# Patient Record
Sex: Male | Born: 1940 | Race: White | Hispanic: No | Marital: Married | Smoking: Never smoker
Health system: Southern US, Community
[De-identification: ages and names within clinical notes are randomized; demographics above are authoritative.]

## PROBLEM LIST (undated history)

## (undated) DIAGNOSIS — S065X9A Traumatic subdural hemorrhage with loss of consciousness of unspecified duration, initial encounter: Secondary | ICD-10-CM

## (undated) DIAGNOSIS — S065XAA Traumatic subdural hemorrhage with loss of consciousness status unknown, initial encounter: Secondary | ICD-10-CM

## (undated) DIAGNOSIS — I10 Essential (primary) hypertension: Secondary | ICD-10-CM

## (undated) DIAGNOSIS — C439 Malignant melanoma of skin, unspecified: Secondary | ICD-10-CM

## (undated) DIAGNOSIS — R7303 Prediabetes: Secondary | ICD-10-CM

## (undated) DIAGNOSIS — H269 Unspecified cataract: Secondary | ICD-10-CM

## (undated) DIAGNOSIS — H548 Legal blindness, as defined in USA: Secondary | ICD-10-CM

## (undated) DIAGNOSIS — M75120 Complete rotator cuff tear or rupture of unspecified shoulder, not specified as traumatic: Secondary | ICD-10-CM

## (undated) DIAGNOSIS — E119 Type 2 diabetes mellitus without complications: Secondary | ICD-10-CM

## (undated) DIAGNOSIS — E785 Hyperlipidemia, unspecified: Secondary | ICD-10-CM

## (undated) DIAGNOSIS — L989 Disorder of the skin and subcutaneous tissue, unspecified: Secondary | ICD-10-CM

## (undated) DIAGNOSIS — M1990 Unspecified osteoarthritis, unspecified site: Secondary | ICD-10-CM

## (undated) DIAGNOSIS — E039 Hypothyroidism, unspecified: Secondary | ICD-10-CM

## (undated) DIAGNOSIS — I4891 Unspecified atrial fibrillation: Secondary | ICD-10-CM

## (undated) HISTORY — PX: APPENDECTOMY: SHX54

## (undated) HISTORY — DX: Essential (primary) hypertension: I10

## (undated) HISTORY — DX: Legal blindness, as defined in USA: H54.8

## (undated) HISTORY — DX: Unspecified osteoarthritis, unspecified site: M19.90

## (undated) HISTORY — DX: Hypothyroidism, unspecified: E03.9

## (undated) HISTORY — DX: Unspecified cataract: H26.9

## (undated) HISTORY — DX: Hyperlipidemia, unspecified: E78.5

## (undated) HISTORY — PX: TOTAL KNEE ARTHROPLASTY: SHX125

## (undated) HISTORY — DX: Prediabetes: R73.03

## (undated) HISTORY — PX: SHOULDER SURGERY: SHX246

## (undated) HISTORY — DX: Disorder of the skin and subcutaneous tissue, unspecified: L98.9

## (undated) HISTORY — DX: Complete rotator cuff tear or rupture of unspecified shoulder, not specified as traumatic: M75.120

## (undated) HISTORY — PX: ROTATOR CUFF REPAIR: SHX139

## (undated) HISTORY — DX: Type 2 diabetes mellitus without complications: E11.9

## (undated) HISTORY — DX: Unspecified atrial fibrillation: I48.91

## (undated) HISTORY — PX: KNEE SURGERY: SHX244

---

## 2004-05-04 ENCOUNTER — Ambulatory Visit: Payer: Self-pay | Admitting: Family Medicine

## 2004-05-12 ENCOUNTER — Ambulatory Visit: Payer: Self-pay | Admitting: Family Medicine

## 2004-09-14 ENCOUNTER — Ambulatory Visit: Payer: Self-pay | Admitting: Family Medicine

## 2004-09-20 ENCOUNTER — Ambulatory Visit: Payer: Self-pay | Admitting: Family Medicine

## 2004-09-22 ENCOUNTER — Ambulatory Visit: Payer: Self-pay | Admitting: Cardiology

## 2004-09-25 ENCOUNTER — Inpatient Hospital Stay (HOSPITAL_BASED_OUTPATIENT_CLINIC_OR_DEPARTMENT_OTHER): Admission: RE | Admit: 2004-09-25 | Discharge: 2004-09-25 | Payer: Self-pay | Admitting: Cardiovascular Disease

## 2004-09-25 ENCOUNTER — Ambulatory Visit: Payer: Self-pay | Admitting: Cardiovascular Disease

## 2004-09-29 ENCOUNTER — Ambulatory Visit: Payer: Self-pay

## 2004-10-24 ENCOUNTER — Ambulatory Visit: Payer: Self-pay | Admitting: Cardiology

## 2005-03-15 ENCOUNTER — Ambulatory Visit: Payer: Self-pay | Admitting: Family Medicine

## 2005-04-17 ENCOUNTER — Ambulatory Visit: Payer: Self-pay | Admitting: Family Medicine

## 2005-09-11 ENCOUNTER — Ambulatory Visit: Payer: Self-pay | Admitting: Family Medicine

## 2005-10-16 ENCOUNTER — Ambulatory Visit (HOSPITAL_COMMUNITY): Admission: RE | Admit: 2005-10-16 | Discharge: 2005-10-16 | Payer: Self-pay | Admitting: Otolaryngology

## 2005-12-24 ENCOUNTER — Ambulatory Visit: Payer: Self-pay | Admitting: Family Medicine

## 2006-01-07 ENCOUNTER — Encounter: Admission: RE | Admit: 2006-01-07 | Discharge: 2006-01-07 | Payer: Self-pay | Admitting: Orthopedic Surgery

## 2006-01-10 ENCOUNTER — Ambulatory Visit: Payer: Self-pay | Admitting: Family Medicine

## 2006-02-04 ENCOUNTER — Encounter: Admission: RE | Admit: 2006-02-04 | Discharge: 2006-03-18 | Payer: Self-pay | Admitting: Orthopedic Surgery

## 2006-06-20 ENCOUNTER — Ambulatory Visit: Payer: Self-pay | Admitting: Family Medicine

## 2006-08-14 ENCOUNTER — Ambulatory Visit: Payer: Self-pay | Admitting: Family Medicine

## 2006-10-21 ENCOUNTER — Encounter: Admission: RE | Admit: 2006-10-21 | Discharge: 2006-10-21 | Payer: Self-pay | Admitting: Otolaryngology

## 2006-11-04 ENCOUNTER — Encounter: Admission: RE | Admit: 2006-11-04 | Discharge: 2006-11-04 | Payer: Self-pay | Admitting: Orthopedic Surgery

## 2006-11-07 ENCOUNTER — Ambulatory Visit (HOSPITAL_BASED_OUTPATIENT_CLINIC_OR_DEPARTMENT_OTHER): Admission: RE | Admit: 2006-11-07 | Discharge: 2006-11-07 | Payer: Self-pay | Admitting: Orthopedic Surgery

## 2006-11-21 ENCOUNTER — Encounter: Admission: RE | Admit: 2006-11-21 | Discharge: 2007-02-19 | Payer: Self-pay | Admitting: Orthopedic Surgery

## 2007-02-27 HISTORY — PX: JOINT REPLACEMENT: SHX530

## 2007-09-08 ENCOUNTER — Ambulatory Visit: Payer: Self-pay | Admitting: Cardiology

## 2007-09-08 LAB — CONVERTED CEMR LAB: TSH: 1.57 microintl units/mL (ref 0.35–5.50)

## 2007-09-10 ENCOUNTER — Ambulatory Visit: Payer: Self-pay

## 2007-09-10 ENCOUNTER — Encounter: Payer: Self-pay | Admitting: Cardiology

## 2007-09-11 ENCOUNTER — Ambulatory Visit: Payer: Self-pay | Admitting: Cardiology

## 2007-09-12 ENCOUNTER — Ambulatory Visit: Payer: Self-pay | Admitting: Cardiology

## 2007-09-19 ENCOUNTER — Ambulatory Visit: Payer: Self-pay | Admitting: Cardiology

## 2007-09-26 ENCOUNTER — Ambulatory Visit: Payer: Self-pay | Admitting: Cardiology

## 2007-10-03 ENCOUNTER — Ambulatory Visit: Payer: Self-pay | Admitting: Cardiology

## 2007-10-10 ENCOUNTER — Ambulatory Visit: Payer: Self-pay | Admitting: Cardiovascular Disease

## 2007-11-05 ENCOUNTER — Ambulatory Visit: Payer: Self-pay | Admitting: Cardiovascular Disease

## 2007-11-05 ENCOUNTER — Ambulatory Visit: Payer: Self-pay | Admitting: Cardiology

## 2007-11-10 ENCOUNTER — Ambulatory Visit (HOSPITAL_COMMUNITY): Admission: RE | Admit: 2007-11-10 | Discharge: 2007-11-10 | Payer: Self-pay | Admitting: Cardiology

## 2007-11-10 ENCOUNTER — Ambulatory Visit: Payer: Self-pay | Admitting: Cardiology

## 2007-11-13 ENCOUNTER — Ambulatory Visit: Payer: Self-pay | Admitting: Cardiology

## 2007-11-13 LAB — CONVERTED CEMR LAB
Chloride: 104 meq/L (ref 96–112)
Creatinine, Ser: 1 mg/dL (ref 0.4–1.5)
GFR calc non Af Amer: 80 mL/min
Potassium: 4.5 meq/L (ref 3.5–5.1)
Sodium: 139 meq/L (ref 135–145)

## 2007-11-27 ENCOUNTER — Ambulatory Visit: Payer: Self-pay | Admitting: Cardiology

## 2007-12-08 ENCOUNTER — Ambulatory Visit: Payer: Self-pay | Admitting: Cardiology

## 2007-12-11 ENCOUNTER — Ambulatory Visit (HOSPITAL_COMMUNITY): Admission: RE | Admit: 2007-12-11 | Discharge: 2007-12-11 | Payer: Self-pay | Admitting: Cardiology

## 2007-12-11 ENCOUNTER — Ambulatory Visit: Payer: Self-pay | Admitting: Internal Medicine

## 2007-12-12 ENCOUNTER — Ambulatory Visit: Payer: Self-pay | Admitting: Cardiology

## 2007-12-29 ENCOUNTER — Ambulatory Visit: Payer: Self-pay | Admitting: Cardiology

## 2008-01-16 ENCOUNTER — Ambulatory Visit: Payer: Self-pay | Admitting: Cardiology

## 2008-01-16 ENCOUNTER — Ambulatory Visit: Payer: Self-pay | Admitting: Cardiovascular Disease

## 2008-01-26 ENCOUNTER — Ambulatory Visit: Payer: Self-pay | Admitting: Cardiology

## 2008-01-26 LAB — CONVERTED CEMR LAB
CO2: 30 meq/L (ref 19–32)
Calcium: 8.6 mg/dL (ref 8.4–10.5)
Chloride: 106 meq/L (ref 96–112)
Sodium: 140 meq/L (ref 135–145)

## 2008-06-26 DIAGNOSIS — M75120 Complete rotator cuff tear or rupture of unspecified shoulder, not specified as traumatic: Secondary | ICD-10-CM

## 2008-06-26 DIAGNOSIS — I10 Essential (primary) hypertension: Secondary | ICD-10-CM | POA: Insufficient documentation

## 2008-06-26 DIAGNOSIS — E039 Hypothyroidism, unspecified: Secondary | ICD-10-CM

## 2008-06-26 DIAGNOSIS — R0602 Shortness of breath: Secondary | ICD-10-CM

## 2008-06-26 DIAGNOSIS — I4891 Unspecified atrial fibrillation: Secondary | ICD-10-CM | POA: Insufficient documentation

## 2008-07-08 ENCOUNTER — Ambulatory Visit: Payer: Self-pay | Admitting: Cardiology

## 2008-07-16 ENCOUNTER — Encounter: Payer: Self-pay | Admitting: Cardiology

## 2008-07-17 ENCOUNTER — Encounter (INDEPENDENT_AMBULATORY_CARE_PROVIDER_SITE_OTHER): Payer: Self-pay | Admitting: *Deleted

## 2008-07-17 LAB — CONVERTED CEMR LAB
Alkaline Phosphatase: 60 units/L
Calcium: 8.8 mg/dL
Cholesterol: 122 mg/dL
LDL Cholesterol: 70 mg/dL
MCV: 89.7 fL
Platelets: 156 10*3/uL
Potassium: 4.3 meq/L
RDW: 14.3 %
TSH: 3.347 microintl units/mL
WBC: 7.4 10*3/uL

## 2008-07-19 ENCOUNTER — Encounter (INDEPENDENT_AMBULATORY_CARE_PROVIDER_SITE_OTHER): Payer: Self-pay | Admitting: *Deleted

## 2008-07-27 ENCOUNTER — Encounter: Payer: Self-pay | Admitting: *Deleted

## 2008-09-01 ENCOUNTER — Encounter: Payer: Self-pay | Admitting: *Deleted

## 2008-10-21 ENCOUNTER — Encounter (INDEPENDENT_AMBULATORY_CARE_PROVIDER_SITE_OTHER): Payer: Self-pay | Admitting: *Deleted

## 2009-01-10 ENCOUNTER — Ambulatory Visit: Payer: Self-pay | Admitting: Cardiology

## 2009-12-27 ENCOUNTER — Ambulatory Visit: Payer: Self-pay | Admitting: Cardiology

## 2009-12-27 ENCOUNTER — Encounter: Payer: Self-pay | Admitting: Cardiology

## 2010-01-03 ENCOUNTER — Telehealth (INDEPENDENT_AMBULATORY_CARE_PROVIDER_SITE_OTHER): Payer: Self-pay | Admitting: *Deleted

## 2010-01-11 ENCOUNTER — Inpatient Hospital Stay (HOSPITAL_COMMUNITY): Admission: RE | Admit: 2010-01-11 | Discharge: 2010-01-15 | Payer: Self-pay | Admitting: Orthopedic Surgery

## 2010-02-06 ENCOUNTER — Encounter
Admission: RE | Admit: 2010-02-06 | Discharge: 2010-02-25 | Payer: Self-pay | Source: Home / Self Care | Attending: Orthopedic Surgery | Admitting: Orthopedic Surgery

## 2010-02-26 ENCOUNTER — Encounter
Admission: RE | Admit: 2010-02-26 | Discharge: 2010-03-28 | Payer: Self-pay | Source: Home / Self Care | Attending: Orthopedic Surgery | Admitting: Orthopedic Surgery

## 2010-03-28 NOTE — Progress Notes (Signed)
Summary: Records Request  Faxed EKG to [facility] at [place] ([phone]). Debby Freiberg  January 03, 2010 4:58 PM

## 2010-03-28 NOTE — Assessment & Plan Note (Signed)
Summary: 1 YR F/U   CC:  sob.  History of Present Illness: [name] [name] is a pleasant  gentleman who has a history of paroxysmal atrial fibrillation.  Note, a previous catheterization in July 2006 showed no coronary artery disease and normal LV function.  His most recent echocardiogram performed on September 10, 2007, showed normal LV function, mildly calcified aortic valve, and mild left atrial enlargement.  TSH also have been in normal. The patient is also on flecainide to help maintain sinus rhythm. I last saw him in Nov 2010. Since then he has dyspnea with more extreme activities but not with routine activities. It is relieved with rest. It is not associated with chest pain. There is no orthopnea or PND. His pedal edema is controlled with diuretics. There is no palpitations or syncope.   Current Medications (verified): 1)  Crestor 5 Mg Tabs (Rosuvastatin Calcium) .... Take One Tablet By Mouth Daily. 2)  Furosemide 20 Mg Tabs (Furosemide) .... Take One Tablet By Mouth Daily. 3)  Diltiazem Hcl Er Beads 360 Mg Xr24h-Cap (Diltiazem Hcl Er Beads) .... Take One Capsule By Mouth Daily 4)  Flecainide Acetate 100 Mg Tabs (Flecainide Acetate) .... Take 1 Tablet By Mouth Twice A Day 5)  Lisinopril 20 Mg Tabs (Lisinopril) .... Take One Tablet By Mouth Daily 6)  Aspirin 325 Mg  Tabs (Aspirin) .Marland Kitchen.. 1 Tab By Mouth Once Daily 7)  Voltaren 75 Mg Tbec (Diclofenac Sodium) .Marland Kitchen.. 1 Tab By Mouth Two Times A Day 8)  Synthroid 100 Mcg Tabs (Levothyroxine Sodium) .... Tab By Mouth Once Daily  Allergies: No Known Drug Allergies  Past History:  Past Medical History: HYPOTHYROIDISM (ICD-244.9) HYPERTENSION (ICD-401.9) ATRIAL FIBRILLATION (ICD-427.31)  Past Surgical History: Reviewed history from 06/26/2008 and no changes required. APPENDECTOMY, HX OF (ICD-V45.79) ROTATOR CUFF TEAR (ICD-727.61)  Social History: Reviewed history from 06/26/2008 and no changes required. [employer] Married  Tobacco Use - No.    Alcohol Use - no  Review of Systems       Problems with bilateral knee pain but no fevers or chills, productive cough, hemoptysis, dysphasia, odynophagia, melena, hematochezia, dysuria, hematuria, rash, seizure activity, orthopnea, PND, pedal edema, claudication. Remaining systems are negative.   Vital Signs:  Patient profile:   70 year old male Height:      68 inches Weight:      272 pounds BMI:     41.51 Pulse rate:   66 / minute Resp:     14 per minute BP sitting:   118 / 80  (left arm)  Vitals Entered By: [name] [name] (December 27, 2009 11:30 AM)  Physical Exam  General:  Well-developed obese in no acute distress.  Skin is warm and dry.  HEENT is normal.  Neck is supple. No thyromegaly.  Chest is clear to auscultation with normal expansion.  Cardiovascular exam is regular rate and rhythm.  Abdominal exam nontender or distended. No masses palpated. Extremities show no edema. neuro grossly intact    EKG  Procedure date:  12/27/2009  Findings:      Sinus rhythm with first degree AV block and nonspecific ST changes.  Impression & Recommendations:  Problem # 1:  PREOPERATIVE EXAMINATION (ICD-V72.84) Patient is scheduled for knee replacement. I do not think he requires further cardiac evaluation preoperatively as he has no change in symptoms and previous catheterization normal. Continue flecainide pre-and postoperatively. He is most likely at increased risk for postoperative atrial fibrillation.  Problem # 2:  ATRIAL FIBRILLATION (ICD-427.31) He  remains in sinus rhythm. Continue flecainide. His only embolic risk factor is hypertension. Continue aspirin. His updated medication list for this problem includes:    Flecainide Acetate 100 Mg Tabs (Flecainide acetate) .Marland Kitchen... Take 1 tablet by mouth twice a day    Aspirin 325 Mg Tabs (Aspirin) .Marland Kitchen... 1 tab by mouth once daily  Orders: EKG w/ Interpretation (93000)  Problem # 3:  HYPERTENSION (ICD-401.9) Blood pressure  controlled on present medications. Will continue. Potassium and renal function monitored by primary care. His updated medication list for this problem includes:    Furosemide 20 Mg Tabs (Furosemide) .Marland Kitchen... Take one tablet by mouth daily.    Diltiazem Hcl Er Beads 360 Mg Xr24h-cap (Diltiazem hcl er beads) .Marland Kitchen... Take one capsule by mouth daily    Lisinopril 20 Mg Tabs (Lisinopril) .Marland Kitchen... Take one tablet by mouth daily    Aspirin 325 Mg Tabs (Aspirin) .Marland Kitchen... 1 tab by mouth once daily  Problem # 4:  HYPOTHYROIDISM (ICD-244.9)  His updated medication list for this problem includes:    Synthroid 100 Mcg Tabs (Levothyroxine sodium) .Marland Kitchen... Tab by mouth once daily  Problem # 5:  DYSPNEA (ICD-786.05) Most likely secondary to size and deconditioning. His updated medication list for this problem includes:    Furosemide 20 Mg Tabs (Furosemide) .Marland Kitchen... Take one tablet by mouth daily.    Diltiazem Hcl Er Beads 360 Mg Xr24h-cap (Diltiazem hcl er beads) .Marland Kitchen... Take one capsule by mouth daily    Lisinopril 20 Mg Tabs (Lisinopril) .Marland Kitchen... Take one tablet by mouth daily    Aspirin 325 Mg Tabs (Aspirin) .Marland Kitchen... 1 tab by mouth once daily  Patient Instructions: 1)  Your physician recommends that you schedule a follow-up appointment in: 1 YEAR WITH DR. CRENSHAW 2)  Your physician recommends that you continue on your current medications as directed. Please refer to the Current Medication list given to you today.

## 2010-05-09 LAB — PROTIME-INR
INR: 1.02 (ref 0.00–1.49)
INR: 1.66 — ABNORMAL HIGH (ref 0.00–1.49)
INR: 1.93 — ABNORMAL HIGH (ref 0.00–1.49)
INR: 2.09 — ABNORMAL HIGH (ref 0.00–1.49)
Prothrombin Time: 13.2 seconds (ref 11.6–15.2)
Prothrombin Time: 13.6 seconds (ref 11.6–15.2)
Prothrombin Time: 19.8 seconds — ABNORMAL HIGH (ref 11.6–15.2)
Prothrombin Time: 22.2 seconds — ABNORMAL HIGH (ref 11.6–15.2)

## 2010-05-09 LAB — COMPREHENSIVE METABOLIC PANEL
ALT: 28 U/L (ref 0–53)
AST: 19 U/L (ref 0–37)
Alkaline Phosphatase: 68 U/L (ref 39–117)
CO2: 26 mEq/L (ref 19–32)
GFR calc Af Amer: >60 mL/min (ref >60)
GFR calc non Af Amer: >60 mL/min (ref >60)
Glucose, Bld: 91 mg/dL (ref 70–99)
Potassium: 4.2 mEq/L (ref 3.5–5.1)
Sodium: 138 mEq/L (ref 135–145)
Total Protein: 7.6 g/dL (ref 6.0–8.3)

## 2010-05-09 LAB — URINALYSIS, ROUTINE W REFLEX MICROSCOPIC
Ketones, ur: NEGATIVE mg/dL
Nitrite: NEGATIVE
Protein, ur: 30 mg/dL — AB
Specific Gravity, Urine: 1.026 (ref 1.005–1.030)
Urobilinogen, UA: 1 mg/dL (ref 0.0–1.0)

## 2010-05-09 LAB — CBC
HCT: 32.2 % — ABNORMAL LOW (ref 39.0–52.0)
HCT: 44.4 % (ref 39.0–52.0)
Hemoglobin: 10.7 g/dL — ABNORMAL LOW (ref 13.0–17.0)
Hemoglobin: 10.9 g/dL — ABNORMAL LOW (ref 13.0–17.0)
Hemoglobin: 12 g/dL — ABNORMAL LOW (ref 13.0–17.0)
Hemoglobin: 14.8 g/dL (ref 13.0–17.0)
MCH: 30.4 pg (ref 26.0–34.0)
MCHC: 34 g/dL (ref 30.0–36.0)
Platelets: 197 10*3/uL (ref 150–400)
Platelets: 205 10*3/uL (ref 150–400)
RBC: 3.5 MIL/uL — ABNORMAL LOW (ref 4.22–5.81)
RBC: 3.61 MIL/uL — ABNORMAL LOW (ref 4.22–5.81)
RBC: 3.94 MIL/uL — ABNORMAL LOW (ref 4.22–5.81)
WBC: 10.5 10*3/uL (ref 4.0–10.5)
WBC: 11.5 10*3/uL — ABNORMAL HIGH (ref 4.0–10.5)
WBC: 9.2 10*3/uL (ref 4.0–10.5)

## 2010-05-09 LAB — BASIC METABOLIC PANEL
CO2: 25 mEq/L (ref 19–32)
Calcium: 8 mg/dL — ABNORMAL LOW (ref 8.4–10.5)
Calcium: 8.1 mg/dL — ABNORMAL LOW (ref 8.4–10.5)
Creatinine, Ser: 0.92 mg/dL (ref 0.4–1.5)
GFR calc Af Amer: >60 mL/min (ref >60)
GFR calc Af Amer: >60 mL/min (ref >60)
GFR calc non Af Amer: >60 mL/min (ref >60)
Glucose, Bld: 138 mg/dL — ABNORMAL HIGH (ref 70–99)
Potassium: 4.2 mEq/L (ref 3.5–5.1)
Sodium: 133 mEq/L — ABNORMAL LOW (ref 135–145)
Sodium: 135 mEq/L (ref 135–145)

## 2010-05-09 LAB — SURGICAL PCR SCREEN
MRSA, PCR: NEGATIVE
Staphylococcus aureus: NEGATIVE

## 2010-05-09 LAB — TYPE AND SCREEN: Antibody Screen: NEGATIVE

## 2010-05-09 LAB — ABO/RH: ABO/RH(D): B POS

## 2010-05-09 LAB — URINE MICROSCOPIC-ADD ON

## 2010-05-11 ENCOUNTER — Encounter (HOSPITAL_COMMUNITY): Payer: Medicare Other | Attending: Orthopedic Surgery

## 2010-05-11 ENCOUNTER — Other Ambulatory Visit: Payer: Self-pay | Admitting: Orthopedic Surgery

## 2010-05-11 DIAGNOSIS — Z01812 Encounter for preprocedural laboratory examination: Secondary | ICD-10-CM | POA: Insufficient documentation

## 2010-05-11 LAB — COMPREHENSIVE METABOLIC PANEL
ALT: 30 U/L (ref 0–53)
Alkaline Phosphatase: 67 U/L (ref 39–117)
BUN: 14 mg/dL (ref 6–23)
CO2: 28 mEq/L (ref 19–32)
Chloride: 104 mEq/L (ref 96–112)
GFR calc non Af Amer: >60 mL/min (ref >60)
Glucose, Bld: 94 mg/dL (ref 70–99)
Potassium: 4.4 mEq/L (ref 3.5–5.1)
Sodium: 140 mEq/L (ref 135–145)
Total Bilirubin: 0.5 mg/dL (ref 0.3–1.2)
Total Protein: 7.3 g/dL (ref 6.0–8.3)

## 2010-05-11 LAB — CBC
HCT: 42.5 % (ref 39.0–52.0)
MCH: 28.1 pg (ref 26.0–34.0)
MCHC: 32.2 g/dL (ref 30.0–36.0)
RDW: 14.3 % (ref 11.5–15.5)

## 2010-05-11 LAB — SURGICAL PCR SCREEN
MRSA, PCR: NEGATIVE
Staphylococcus aureus: NEGATIVE

## 2010-05-11 LAB — URINALYSIS, ROUTINE W REFLEX MICROSCOPIC
Bilirubin Urine: NEGATIVE
Glucose, UA: NEGATIVE mg/dL
Ketones, ur: NEGATIVE mg/dL
Nitrite: NEGATIVE
Protein, ur: NEGATIVE mg/dL

## 2010-05-11 LAB — PROTIME-INR: INR: 1.04 (ref 0.00–1.49)

## 2010-05-17 ENCOUNTER — Inpatient Hospital Stay (HOSPITAL_COMMUNITY)
Admission: RE | Admit: 2010-05-17 | Discharge: 2010-05-21 | DRG: 470 | Disposition: A | Discharge status: Home Health | Payer: Medicare Other | Source: Ambulatory Visit | Attending: Orthopedic Surgery | Admitting: Orthopedic Surgery

## 2010-05-17 DIAGNOSIS — Z96659 Presence of unspecified artificial knee joint: Secondary | ICD-10-CM

## 2010-05-17 DIAGNOSIS — M1710 Unilateral primary osteoarthritis, unspecified knee: Principal | ICD-10-CM | POA: Diagnosis present

## 2010-05-17 DIAGNOSIS — E039 Hypothyroidism, unspecified: Secondary | ICD-10-CM | POA: Diagnosis present

## 2010-05-17 DIAGNOSIS — K5900 Constipation, unspecified: Secondary | ICD-10-CM | POA: Diagnosis not present

## 2010-05-17 DIAGNOSIS — E785 Hyperlipidemia, unspecified: Secondary | ICD-10-CM | POA: Diagnosis present

## 2010-05-17 DIAGNOSIS — I4891 Unspecified atrial fibrillation: Secondary | ICD-10-CM | POA: Diagnosis present

## 2010-05-17 DIAGNOSIS — I10 Essential (primary) hypertension: Secondary | ICD-10-CM | POA: Diagnosis present

## 2010-05-17 NOTE — H&P (Addendum)
[name] [name], [name] [name]                 ACCOUNT NO.:  [id number]  MEDICAL RECORD NO.:  [id number]           PATIENT TYPE:  I  LOCATION:  0006                         FACILITY:  [facility]  PHYSICIAN:  Ollen Gross, M.D.    DATE OF BIRTH:  08-23-1940  DATE OF ADMISSION:  05/17/2010 DATE OF DISCHARGE:                             HISTORY & PHYSICAL   CHIEF COMPLAINT:  Right knee pain.  BRIEF HISTORY:  [name] [name] has been followed for worsening knee pain by Dr. Lequita Halt.  He recently had a left total-knee arthroplasty about four months ago now.  He is doing very well with that and he feels that the pain and dysfunction in the right knee continues to worsen so he now presents for a right total-knee arthroplasty.  MEDICATION ALLERGIES:  NONE.  PRIMARY CARE PHYSICIAN:  Delaney Meigs, M.D.  CURRENT MEDICATIONS: 1. Crestor. 2. Synthroid. 3. Diclofenac. 4. Lisinopril. 5. Flecainide. 6. Diltiazem. 7. Furosemide. 8. Aspirin.  PAST MEDICAL HISTORY: 1. End-stage arthritis of the right knee. 2. Dentures both top and bottom.  They are partial. 3. Hypertension. 4. Hyperlipidemia. 5. Atrial fibrillation.  However, he states that he has not had any     episodes of this since starting the flecainide and diltiazem. 6. Thyroid disease. 7. Arthritis.  PAST SURGICAL HISTORY: 1. Left total-knee January 11, 2010. 2. Arthroscopic surgery on both knees. 3. Left shoulder surgery. 4. Appendectomy.  FAMILY HISTORY:  Father passed at the age of 19.  He had asthma and COPD.  Mother passed at the age of 29 of a heart attack.  SOCIAL HISTORY:  The patient is married.  He denies past or present use of alcohol or tobacco products.  He has two children.  He lives at home with his family and he does plan to go home following his hospital stay. He has four steps to get into his home.  REVIEW OF SYSTEMS:  GENERAL:  Negative for fevers, chills or weight change.  HEENT/NEUROLOGIC:  Negative for headache  or blurred vision.  He does wear dentures.  DERMATOLOGIC:  Negative for rash or lesion. RESPIRATORY:  Positive for shortness of breath on exertion.  No shortness of breath at rest.  Negative for cough or wheezing. CARDIOVASCULAR:  Negative for chest pain or palpitations.  Last EKG was February 2012.  GASTROINTESTINAL:  Negative for nausea, vomiting or diarrhea.  GENITOURINARY:  Positive for urinating at nighttime. Negative for dysuria or hematuria.  MUSCULOSKELETAL:  Positive for joint pain and joint swelling.  PHYSICAL EXAMINATION:  VITAL SIGNS:  Pulse 76, respirations 18, blood pressure 146/80 in the left arm. GENERAL:  [name] [name] is alert and oriented x3.  He is well-developed, well-nourished, in no apparent distress.  He is a pleasant 70 year old male who is a stated height of 5 feet 9 inches and a stated weight of 290 pounds. HEENT:  Normocephalic, atraumatic.  Extraocular movements intact.  The patient wears reading glasses. NECK:  Supple.  Full range of motion without lymphadenopathy. CHEST:  Lungs are clear to auscultation bilaterally without wheezes. HEART:  Regular rate and rhythm without murmur. ABDOMEN:  Bowel sounds present in all four quadrants. EXTREMITIES:  Right knee decreased range of motion, marked crepitus throughout the range of motion. SKIN:  Unremarkable. NEUROLOGIC:  Intact.  RADIOGRAPHS:  AP and lateral views of the right knee reveal end-stage arthritis.  IMPRESSION:  End-stage arthritis of the right knee.  PLAN:  Right total-knee arthroplasty to be performed by Dr. Lequita Halt.     Rozell Searing, PAC   ______________________________ Ollen Gross, M.D.    LD/MEDQ  D:  05/17/2010  T:  05/17/2010  Job:  [id number]  cc:   Delaney Meigs, M.D. Fax: [phone]  Electronically Signed by Rozell Searing  on 05/17/2010 03:41:50 PM Electronically Signed by Ollen Gross M.D. on 05/22/2010 07:11:47 AM

## 2010-05-18 LAB — CBC
HCT: 34.3 % — ABNORMAL LOW (ref 39.0–52.0)
Hemoglobin: 11.1 g/dL — ABNORMAL LOW (ref 13.0–17.0)
MCH: 28 pg (ref 26.0–34.0)
MCHC: 32.4 g/dL (ref 30.0–36.0)
MCV: 86.6 fL (ref 78.0–100.0)
RDW: 13.9 % (ref 11.5–15.5)

## 2010-05-18 LAB — BASIC METABOLIC PANEL
BUN: 6 mg/dL (ref 6–23)
CO2: 28 mEq/L (ref 19–32)
Calcium: 8 mg/dL — ABNORMAL LOW (ref 8.4–10.5)
GFR calc non Af Amer: >60 mL/min (ref >60)
Glucose, Bld: 146 mg/dL — ABNORMAL HIGH (ref 70–99)
Sodium: 135 mEq/L (ref 135–145)

## 2010-05-19 LAB — BASIC METABOLIC PANEL
CO2: 30 mEq/L (ref 19–32)
Calcium: 8.4 mg/dL (ref 8.4–10.5)
Creatinine, Ser: 0.86 mg/dL (ref 0.4–1.5)
GFR calc Af Amer: >60 mL/min (ref >60)
GFR calc non Af Amer: >60 mL/min (ref >60)
Glucose, Bld: 144 mg/dL — ABNORMAL HIGH (ref 70–99)
Sodium: 133 mEq/L — ABNORMAL LOW (ref 135–145)

## 2010-05-19 LAB — CBC
Hemoglobin: 11.3 g/dL — ABNORMAL LOW (ref 13.0–17.0)
MCH: 28 pg (ref 26.0–34.0)
MCHC: 32.4 g/dL (ref 30.0–36.0)
RDW: 13.8 % (ref 11.5–15.5)

## 2010-05-20 LAB — BASIC METABOLIC PANEL
Calcium: 8.5 mg/dL (ref 8.4–10.5)
Creatinine, Ser: 0.91 mg/dL (ref 0.4–1.5)
GFR calc Af Amer: >60 mL/min (ref >60)
GFR calc non Af Amer: >60 mL/min (ref >60)
Sodium: 131 mEq/L — ABNORMAL LOW (ref 135–145)

## 2010-05-20 LAB — CBC
Hemoglobin: 10.6 g/dL — ABNORMAL LOW (ref 13.0–17.0)
MCH: 27.7 pg (ref 26.0–34.0)
MCHC: 32.4 g/dL (ref 30.0–36.0)
Platelets: 209 10*3/uL (ref 150–400)
RBC: 3.82 MIL/uL — ABNORMAL LOW (ref 4.22–5.81)

## 2010-05-22 NOTE — Op Note (Signed)
[name], [name]                 ACCOUNT NO.:  [id number]  MEDICAL RECORD NO.:  [id number]           PATIENT TYPE:  I  LOCATION:  0006                         FACILITY:  [facility]  PHYSICIAN:  Ollen Gross, M.D.    DATE OF BIRTH:  [birth year]  DATE OF PROCEDURE:  05/17/2010 DATE OF DISCHARGE:                              OPERATIVE REPORT   PREOPERATIVE DIAGNOSIS:  Osteoarthritis, right knee.  POSTOPERATIVE DIAGNOSIS:  Osteoarthritis, right knee.  PROCEDURE:  Right total knee arthroplasty.  SURGEON:  Ollen Gross, M.D.  ASSISTANT:  Tech assist.  ANESTHESIA:  General.  ESTIMATED BLOOD LOSS:  Minimal.  DRAINS:  Hemovac x1.  TOURNIQUET TIME:  47 minutes at 300 mmHg.  COMPLICATIONS:  None.  CONDITION:  Stable to recovery.  BRIEF CLINICAL NOTE:  [name] is a 70 year old male with end-stage arthritis of the right knee with progressively worsening pain and dysfunction.  He has failed nonoperative management and presents for total knee arthroplasty.  PROCEDURE IN DETAIL:  After successful administration of general anesthetic, a tourniquet is placed on the right thigh, and his right lower extremity is prepped and draped in the usual sterile fashion. Extremity is wrapped in Esmarch, knee flexed, and tourniquet inflated to 300 mmHg.  Midline incision is made with a 10 blade through subcutaneous tissue to the level of the extensor mechanism.  Fresh blade is used to make a medial parapatellar arthrotomy.  Soft tissue on the proximal medial tibia subperiosteally elevated to joint line with the knife and into the semimembranosus bursa with a Cobb elevator.  Soft tissue laterally is elevated with attention being paid to avoiding the patellar tendon on the tibial tubercle.  Patella was everted, knee flexed 90 degrees, and ACL and PCL removed.  Drill was used create a starting hole in the distal femur, and the canal was thoroughly irrigated.  A 5-degree right valgus alignment guide  is placed, and the distal femoral cutting block is pinned to remove 11 mm off the distal femur.  Distal femoral resection is made with an oscillating saw.  Tibia is subluxed forward, and the menisci are removed.  Extramedullary tibial alignment guide is placed referencing proximally at the medial aspect of the tibial tubercle and distally along the second metatarsal axis and tibial crest.  Block is pinned to remove 2 mm off the more deficient medial side.  Tibial resection is made with an oscillating saw.  Size 4 is the most appropriate tibial component, and the proximal tibia is prepared to modular drill and Keel punch for the size 4.  Femoral sizing guide is placed, and size 5 is the most appropriate. Rotation is marked off the epicondylar axis and confirmed by creating a rectangular flexion gap at 90 degrees.  The block is pinned in this rotation, and the anterior-posterior and chamfer cuts are made.  The intercondylar block is placed and that cut is made.  The trial size 5 posterior stabilized femur is placed.  The 12.5 mm posterior stabilized rotating platform insert trial was placed with the 12.5.  Full extension is achieved with excellent varus-valgus and anterior-posterior balance throughout full  range of motion.  Patella was everted and thickness measured to be 25 mm.  Freehand resection is taken to 13 mm, 41 template is placed, lug holes were drilled, trial patella was placed and it tracks normally.  Osteophytes are then removed off the posterior femur with the trial in place.  All trials are removed, and the cut bone surfaces are prepared with pulsatile lavage.  Cement is mixed, and once ready for implantation, the size 4 mobile bearing tibial tray, size 5 posterior stabilized femur, and 41 patella are cemented into place, and the patella is held with the clamp.  Trial 12.5-mm insert is placed, knee held in full extension, and all extruded cement removed.  When the cement was  fully hardened, then the permanent 12.5 mm posterior stabilized rotating platform insert is placed into the tibial tray. Wound was copiously irrigated with the saline solution and the arthrotomy closed over a Hemovac drain with interrupted #1 PDS.  Flexion against gravity to 135 degrees.  Patella tracks normally.  Tourniquet is released, total time of 47 minutes.  No significant bleeding is identified.  Minor bleeding is stopped with electrocautery. Subcutaneous is closed with interrupted 2-0 Vicryl and subcuticular with running 4-0 Monocryl.  Catheter for the Marcaine pain pump is placed and the pump is initiated.  Incisions cleaned and dried, and Steri-Strips and a bulky sterile dressing are applied.  He is then placed into a knee immobilizer, awakened, and transported to recovery in stable condition.     Ollen Gross, M.D.     FA/MEDQ  D:  05/17/2010  T:  05/17/2010  Job:  [id number]  Electronically Signed by Ollen Gross M.D. on 05/22/2010 07:11:50 AM

## 2010-06-12 ENCOUNTER — Ambulatory Visit: Payer: Medicare Other | Attending: Orthopedic Surgery | Admitting: Physical Therapy

## 2010-06-12 DIAGNOSIS — Z96659 Presence of unspecified artificial knee joint: Secondary | ICD-10-CM | POA: Insufficient documentation

## 2010-06-12 DIAGNOSIS — IMO0001 Reserved for inherently not codable concepts without codable children: Secondary | ICD-10-CM | POA: Insufficient documentation

## 2010-06-12 DIAGNOSIS — M25569 Pain in unspecified knee: Secondary | ICD-10-CM | POA: Insufficient documentation

## 2010-06-12 DIAGNOSIS — M25669 Stiffness of unspecified knee, not elsewhere classified: Secondary | ICD-10-CM | POA: Insufficient documentation

## 2010-06-12 DIAGNOSIS — R5381 Other malaise: Secondary | ICD-10-CM | POA: Insufficient documentation

## 2010-06-14 ENCOUNTER — Ambulatory Visit: Payer: Medicare Other | Admitting: Physical Therapy

## 2010-06-15 ENCOUNTER — Ambulatory Visit: Payer: Medicare Other | Admitting: Physical Therapy

## 2010-06-19 ENCOUNTER — Ambulatory Visit: Payer: Medicare Other | Admitting: Physical Therapy

## 2010-06-20 ENCOUNTER — Ambulatory Visit: Payer: Medicare Other | Admitting: *Deleted

## 2010-06-21 ENCOUNTER — Ambulatory Visit: Payer: Medicare Other | Admitting: Physical Therapy

## 2010-06-26 ENCOUNTER — Ambulatory Visit: Payer: Medicare Other | Admitting: Physical Therapy

## 2010-06-27 ENCOUNTER — Ambulatory Visit: Payer: Medicare Other | Attending: Orthopedic Surgery | Admitting: Physical Therapy

## 2010-06-27 DIAGNOSIS — M25669 Stiffness of unspecified knee, not elsewhere classified: Secondary | ICD-10-CM | POA: Insufficient documentation

## 2010-06-27 DIAGNOSIS — Z96659 Presence of unspecified artificial knee joint: Secondary | ICD-10-CM | POA: Insufficient documentation

## 2010-06-27 DIAGNOSIS — IMO0001 Reserved for inherently not codable concepts without codable children: Secondary | ICD-10-CM | POA: Insufficient documentation

## 2010-06-27 DIAGNOSIS — M25569 Pain in unspecified knee: Secondary | ICD-10-CM | POA: Insufficient documentation

## 2010-06-27 DIAGNOSIS — R5381 Other malaise: Secondary | ICD-10-CM | POA: Insufficient documentation

## 2010-06-29 ENCOUNTER — Ambulatory Visit: Payer: Medicare Other | Admitting: *Deleted

## 2010-07-03 ENCOUNTER — Ambulatory Visit: Payer: Medicare Other | Admitting: Physical Therapy

## 2010-07-04 ENCOUNTER — Ambulatory Visit: Payer: Medicare Other | Admitting: *Deleted

## 2010-07-06 ENCOUNTER — Ambulatory Visit: Payer: Medicare Other | Admitting: *Deleted

## 2010-07-11 NOTE — Assessment & Plan Note (Signed)
[name]                            CARDIOLOGY OFFICE NOTE   [name], [name] [name]                        MRN:          [id number]  DATE:09/08/2007                            DOB:          [birth year]    [name] [name] is a 70 year old male who I am asked to evaluate for new onset  atrial fibrillation.  Note, the patient has been seen in this office in  the past.  In July 2006, I saw the patient for exertional chest pain.  He underwent cardiac catheterization on September 25, 2004.  There was no  obstructive coronary artery disease noted and the ejection fraction 60%.  Also note, he has carotid Dopplers performed on Jun 29, 2004, that showed  to 0-39% stenosis bilaterally.  I have not seen the patient since October 24, 2004.  He was recently seen for a routine physical examination and  apparently was noted to be in atrial fibrillation and we were asked to  further evaluate.  Note, he does have some dyspnea on exertion, which  has been a chronic issue.  However, there is no orthopnea, PND, or pedal  edema.  He has not had chest pain, palpitations, or syncope.   MEDICATIONS:  1. Synthroid 100 mcg p.o. daily.  2. Aspirin 81 mg p.o. daily.  3. Lisinopril 10 mg daily.   ALLERGIES:  He has no known drug allergies.   SOCIAL HISTORY:  He does not smoke nor does he consume alcohol.   FAMILY HISTORY:  Positive for coronary artery disease in both his  brothers.   PAST MEDICAL HISTORY:  Significant for hypertension.  There is no  diabetes mellitus.  He states he was recently told he had  hyperlipidemia, will require medication.  He does have a history of  hypothyroidism as well.  He has had prior appendectomy as well as  rotator cuff surgery.  He has had previous arthroscopic knee surgery  bilaterally.   REVIEW OF SYSTEMS:  He denies any headaches, fevers, or chills.  There  was no productive cough or hemoptysis.  There is no dysphagia,  odynophagia, melena, or  hematochezia.  There is no dysuria or hematuria.  There is no rash or seizure activity.  There is no orthopnea, PND, or  pedal edema.  Remaining systems are negative.   PHYSICAL EXAMINATION:  VITAL SIGNS:  Today shows a blood pressure of  125/80, his pulse is 128, and his weighs 287.  GENERAL:  He is well-developed and an obese.  He is no acute distress at  present.  He does not appeared to be depressed.  There is no peripheral  clubbing.  SKIN:  Warm, dry.  BACK:  Normal.  HEENT:  Normal with normal eyelids.  NECK:  Supple with a normal upstroke bilaterally.  I cannot appreciate  bruits.  There is no jugular distention and no thyromegaly.  CHEST:  Clear to auscultation.  No expansion.  CARDIOVASCULAR:  Irregular rhythm.  I cannot appreciate murmurs, rubs,  or gallop.  He is also tachycardic.  ABDOMEN:  Nontender and nondistended.  Positive  bowel sounds.  No  hepatosplenomegaly.  No mass appreciated.  There is no abdominal bruit.  He has 2+ femoral pulses bilaterally.  No bruits.  EXTREMITIES:  No edema noted and I can palpate no cords.  He has 2+  posterior tibial pulses bilaterally.  NEUROLOGIC:  Grossly intact.   LABORATORY DATA:  His electrocardiogram shows atrial fibrillation with  rapid ventricle response at 128.  The axis is normal.  There are  nonspecific ST changes.   DIAGNOSIS:  1. New-onset atrial fibrillation - [name] [name] is in atrial fibrillation      with rapid ventricle response.  However, he appears to be      relatively asymptomatic.  We will plan to discontinue his      lisinopril and we will add Cardizem CD 240 mg p.o. daily for rate      control.  We will also check an echocardiogram to quantify his left      ventricular function and also a TSH to make sure that      hyperthyroidism is not contributing.  Note, his CHADS2 score is 1      for hypertension.  However, we will begin Coumadin at 5 mg p.o.      daily in anticipation of cardioverting the patient if he  does not      convert on his own.  He will be followed in the Coumadin Clinic      with an INR check on September 12, 2007.  I do not think he will require      Coumadin long term given his low risk.  Note, if he has recurrent      atrial fibrillation in the future following the cardioversion, then      I would tend to favor rate control as he does not appear to be      having significant symptoms.  2. Hypertension - his blood pressure appears to be adequately      controlled, but we are changing his medications as described above      to treat both his blood pressure and his atrial fibrillation.  3. Hypothyroidism - he will continue his present medications.  We will      check a TSH.  4. History of no significant coronary artery disease on      catheterization in 2006.   I will see him back on September 11, 2007, to make sure his rate has improved  on the Cardizem.     Madolyn Frieze Jens Som, MD, [facility]  Electronically Signed    BSC/MedQ  DD: 09/08/2007  DT: 09/09/2007  Job #: [id number]   cc:   Delaney Meigs, M.D.

## 2010-07-11 NOTE — Procedures (Signed)
[facility]                              EXERCISE TREADMILL   NAME:[name] [name], [name] [name]                        MRN:          [id number]  DATE:12/12/2007                            DOB:          1940/07/01    [name] is a pleasant 70 year old male who has a history of paroxysmal  atrial fibrillation.  He was recently placed on flecainide and underwent  cardioversion to sinus rhythm yesterday.  The studies performed to  exclude exercise-induced arrhythmia.   The patient exercised for 2 minutes and 48 seconds on the Bruce  protocol.  His resting heart rate was 70 and increased to a maximum of  111.  His blood pressure at rest was 147/80 and increased to 202/65.  There was no chest pain during the study.  There was a rare PVC, but no  sustained ventricular arrhythmias were noted.  There are no ST changes.  The study was terminated secondary to fatigue.   FINAL INTERPRETATION:  Exercise treadmill with poor tolerance.  However,  there were no exercise-induced ventricular arrhythmias and there were no  ST changes.     Madolyn Frieze Jens Som, MD, [facility]  Electronically Signed    BSC/MedQ  DD: 12/12/2007  DT: 12/13/2007  Job #: [id number]

## 2010-07-11 NOTE — Assessment & Plan Note (Signed)
[name]                            CARDIOLOGY OFFICE NOTE   NAME:[name] [name]                        MRN:          [id number]  DATE:01/16/2008                            DOB:          1940-06-09    [name] is a pleasant 69 year old gentleman who has a history of  paroxysmal atrial fibrillation.  Note, a previous catheterization in  July 2006 showed no coronary artery disease and normal LV function.  His  most recent echocardiogram performed on September 10, 2007, showed normal LV  function, mildly calcified aortic valve, and mild left atrial  enlargement.  TSH also have been in normal.  The patient was recently  cardioverted, but returned atrial fibrillation.  He was, therefore,  placed on flecainide and underwent a repeat cardioversion to sinus  rhythm.  Since then, he does have some dyspnea on exertion.  However, it  is improved compare to prior to the cardioversion.  It is relieved with  rest.  There is no associated cough or chest pain.  He has not had  palpitations or syncope.  He does have chronic pedal edema which is also  unchanged.  There is no bleeding.   MEDICATIONS:  1. Synthroid 100 mcg p.o. daily.  2. Coumadin as directed.  3. Crestor 5 mg p.o. daily.  4. Voltaren 75 mg p.o. b.i.d.  5. Cardizem 360 mg p.o. daily.  6. Lasix 20 mg p.o. daily.  7. Flecainide 100 mg p.o. b.i.d.   PHYSICAL EXAMINATION:  VITAL SIGNS:  Blood pressure of 165/87 and his  pulse is 60.  He weighs 295 pounds.  HEENT:  Normal.  NECK:  Supple.  CHEST:  Clear.  CARDIOVASCULAR:  Regular rate and rhythm.  ABDOMEN:  No tenderness.  EXTREMITIES:  Traced 1+ edema.   His electrocardiogram today shows a sinus rhythm at a rate of 64.  There  is a first-degree AV block.  There are nonspecific ST changes.   DIAGNOSES:  1. Atrial fibrillation status post cardioversion to sinus rhythm - the      patient remains in sinus rhythm today.  We will continue his  flecainide as well as his Cardizem.  He has embolic risk factors of      hypertension, so his CHAD2 score is 1.  I, therefore, will      discontinue his Coumadin as it has been greater than 4 weeks since      his cardioversion.  We will instead treat with enteric-coated      aspirin 325 mg p.o. daily.  2. Dyspnea - this may be due to his size and deconditioning.  He      remains in sinus rhythm.  3. Hypertension - his blood pressure is elevated today.  I will add      lisinopril 10 mg p.o. daily and we will check a BMET in 1 week to      follow his potassium and renal function.  4. Hypothyroidism.  5. History of no coronary artery disease on previous catheterization.   We will see him back in 6 months.  Madolyn Frieze Jens Som, MD, [facility]  Electronically Signed    BSC/MedQ  DD: 01/16/2008  DT: 01/17/2008  Job #: [id number]

## 2010-07-11 NOTE — Op Note (Signed)
NAMEMATEJ, SAPPENFIELD                 ACCOUNT NO.:  [id number]   MEDICAL RECORD NO.:  [id number]          PATIENT TYPE:  AMB   LOCATION:  DSC                          FACILITY:  [facility]   PHYSICIAN:  Rodney A. Mortenson, M.D.DATE OF BIRTH:  [birth year]   DATE OF PROCEDURE:  11/07/2006  DATE OF DISCHARGE:                               OPERATIVE REPORT   JUSTIFICATION:  This 70 year old male has had trouble with his right  knee for about 6 months with pain along the medial joint line.  Rolling  over in bed causes significant pain.  He has trouble with stairs.  Discomfort is noted in the popliteal area.  There is definite tenderness  along the medial joint line.  An MRI is done and this shows a complex  tear of the medial meniscus with maceration of the body of the medial  meniscus and tears including the posterior horn.  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  Because of persistent pain  and discomfort, it is felt that arthroscopic evaluation and treatment  are indicated and necessary.  Complications discussed.  Questions  answered and encouraged.  It has been outlined that osteophytic changes  could not be handled through the arthroscope and with the majority of  his pain from the torn meniscus, trimming the meniscus back with be  beneficial.   JUSTIFICATION FOR OUTPATIENT SURGERY:  Minimal morbidity.   PREOPERATIVE DIAGNOSIS:  Torn medial meniscus, right knee;  osteoarthritis, tricompartmental area, right knee.   POSTOPERATIVE DIAGNOSIS:  Torn medial meniscus, right knee;  osteoarthritis, tricompartmental area, right knee.   OPERATION:  Partial medial meniscectomy, right knee.   SURGEON:  Lenard Galloway. Chaney Malling, M.D.   ANESTHESIA:  General.   PATHOLOGY:  With the arthroscope, a very careful examination of the knee  was undertaken.  There was some chondromalacia on the back side of the  patella and there was fraying of the articular cartilage in the femoral  notch area rated about grade 2 cartilage damage.  The anterior cruciate  ligament was normal.  In the lateral compartment, articular cartilage,  lateral femoral condyle and lateral tibial plateau were absolutely  normal and the entire circumference of the lateral meniscus was normal.  In the medial compartment, there was extensive fraying and tearing of  the posterior one-half of the medial meniscus.  There was an area of  full-thickness cartilage loss off the weightbearing area of the medial  tibial plateau.   PROCEDURE:  The patient was placed on the operative table in a supine  position with a pneumatic tourniquet about the right upper thigh.  The  right leg was placed in a leg holder and the entire right lower  extremity prepped with DuraPrep and draped out in the usual manner.  An  infusion cannula was placed in the superomedial pouch and the knee  distended with saline.  Anteromedial and anterolateral portals were made  and the arthroscope was introduced.  Attention was turned to the medial  compartment.  Through the medial and lateral portals, arthroscope was  introduced.  Visualization was somewhat compromised  and additional  portals were made; 4 anterior portals were eventually utilized and this  gave excellent access.  There was extensive tearing of the posterior  horn of the medial meniscus and this was debrided through several  portals with different baskets.  This was followed up with the intra-  articular shaver.  The remaining rim was then smoothed and balanced with  a nice transition at the mid third of the medial meniscus.  Again, there  was a large area of full-thickness cartilage loss off the weightbearing  area of the medial tibial plateau that could not be addressed.  Knee was  then filled with Marcaine, a large bulky pressure dressing applied and  the patient returned to recovery room in excellent condition.  Technically, this went extremely well.   FOLLOWUP  CARE:  1. Percocet for pain.  2. To my office on Wednesday.  3. Usual postoperative instructions given.           ______________________________  Lenard Galloway. Chaney Malling, M.D.     RAM/MEDQ  D:  11/07/2006  T:  11/08/2006  Job:  [id number]

## 2010-07-11 NOTE — Op Note (Signed)
NAMEKARMELLO, [name] [name]                 ACCOUNT NO.:  [id number]   MEDICAL RECORD NO.:  [id number]          PATIENT TYPE:  OIB   LOCATION:  2899                         FACILITY:  [facility]   PHYSICIAN:  Hillis Range, MD       DATE OF BIRTH:  04/29/1940   DATE OF PROCEDURE:  DATE OF DISCHARGE:  12/11/2007                               OPERATIVE REPORT   PREPROCEDURE DIAGNOSIS:  Persistent atrial fibrillation.   POSTPROCEDURE DIAGNOSIS:  Persistent atrial fibrillation.   PROCEDURE:  DC cardioversion.   DESCRIPTION OF THE PROCEDURE:  Informed written consent was obtained and  the patient was brought to the short stay area.  He was adequately  sedated with intravenous propofol as administered by anesthesia.  The  patient was noted to be in atrial fibrillation upon presentation.  He  was successfully cardioverted to sinus rhythm with a single synchronized  200 joules biphasic shock with cardioversion, electrodes in the  anteroposterior thoracic configuration.  He remained in sinus rhythm  thereafter.  There were no early apparent complications.   CONCLUSIONS:  1. Persistent atrial fibrillation upon presentation.  2. Successful cardioversion to sinus rhythm.  3. No early apparent complications.      Hillis Range, MD  Electronically Signed     JA/MEDQ  D:  12/11/2007  T:  12/11/2007  Job:  [id number]   cc:   Madolyn Frieze. Jens Som, MD, [facility]

## 2010-07-11 NOTE — Assessment & Plan Note (Signed)
[facility]   NAME:  [name] [name], [name] [name]                 ACCOUNT NO.:  [id number]   MEDICAL RECORD NO.:  [id number]      DATE OF BIRTH:  Jul 04, 1940   PHYSICIAN:  Madolyn Frieze. Jens Som, MD, [facility] VISIT DATE:  11/10/2007                                   OFFICE VISIT   The patient was scheduled for cardioversion of atrial fibrillation.  He  was sedated with Pentothal 150 mg intravenously.  Synchronized  cardioversion 150 joules (biphasic) resulted in normal sinus rhythm.  There were no immediate complications.  The patient then reverted back  to atrial fibrillation after less than 1 minute.  A repeat cardioversion  was then re-attempted after 100 mg of Pentothal was re-administered.  150 joules (biphasic) were used for this cardioversion.  Again, the  patient developed transient normal sinus rhythm but reverted back to  atrial fibrillation.  We therefore plan to continue with a rate control  anticoagulation.  I will have him followup in the office and we will  review rate control anticoagulation/antiarrhythmic verses ablation  strategies.      Madolyn Frieze Jens Som, MD, [facility]  Electronically Signed     BSC/MEDQ  D:  11/10/2007  T:  11/11/2007  Job:  [id number]

## 2010-07-11 NOTE — Assessment & Plan Note (Signed)
[facility]                            CARDIOLOGY OFFICE NOTE   NAME:[name], [name] GOETTING                        MRN:          [id number]  DATE:09/11/2007                            DOB:          Jun 26, 1940    [name] [name] is a very pleasant 70 year old gentleman that I recently saw  on September 08, 2007 for a new-onset atrial fibrillation.  At that time, it  was noted that a previous catheterization in July 2006 showed no  coronary artery disease.  We did schedule him to have an echocardiogram  which was performed on September 10, 2007.  The patient's LV function was  normal.  The left atrium is mildly dilated.  We also scheduled him to  have a TSH which was normal at 1.57.  At that time, I added Cardizem 240  mg to his regimen for rate control.  Since then, he denies any dyspnea,  chest pain, palpitations, or syncope and there is no increased pedal  edema.   His present medications include:  1. Synthroid 100 mcg p.o. daily.  2. Aspirin 81 mg p.o. daily.  3. Lisinopril 10 mg p.o. daily.  4. Coumadin as directed.  5. Cardizem CD 240 mg p.o. daily.  6. Crestor 5 mg p.o. daily.  7. Voltaren 75 mg daily.   Physical exam today shows a blood pressure of 132/84 and his pulse is  105.  He weighs 289 pounds.  His HEENT is normal.  His neck is supple.  His chest is clear.  His cardiovascular exam reveals an irregular  rhythm.  His abdominal exam shows no tenderness.  Extremities show trace  edema.   His electrocardiogram shows atrial fibrillation at a rate of 105.  There  are occasional PVCs or aberrantly conducted beats.  There are no  significant ST changes.   DIAGNOSES:  1. New-onset atrial fibrillation - [name] [name] remains in atrial      fibrillation and his heart rate has improved, but still mildly      elevated.  I will increase his Cardizem CD to 360 mg p.o. daily.      Note, his left ventricular function is normal as is his TSH.  He      will continue on his  Coumadin and he is scheduled to be checked in      the [facility] tomorrow.  We will plan to proceed with      cardioversion in 3 weeks after his INR has been therapeutic.  If he      does not hold sinus rhythm, then I would favor rate control and      anticoagulation, as he has no symptoms with this.  Note, I am      discontinuing his lisinopril, as we are increasing his Cardizem.  2. Hypertension - his blood pressure appears to be adequately      controlled and we are adjusting his regimen as described above.  3. Hypothyroidism - he will continue on his present dose of Synthroid.  4. History of no significant coronary artery disease on  catheterization.   We will see him back in 8 weeks.  In the meantime, we will be following  his INRs and proceed with elective cardioversion after his INR has been  therapeutic for three consecutive weeks.     Madolyn Frieze Jens Som, MD, [facility]  Electronically Signed    BSC/MedQ  DD: 09/11/2007  DT: 09/11/2007  Job #: [id number]   cc:   Delaney Meigs, M.D.

## 2010-07-11 NOTE — Assessment & Plan Note (Signed)
[facility]                            CARDIOLOGY OFFICE NOTE   NAME:[name] [name], [name] [name]                        MRN:          [id number]  DATE:12/08/2007                            DOB:          [birth year]    HISTORY OF PRESENT ILLNESS:  [name] is a very pleasant 70 year old  gentleman who has history of recently diagnosed atrial fibrillation.  A  previous catheterization in July 2006 showed no coronary artery disease  and normal LV function.  He recently developed atrial fibrillation.  A  followup echocardiogram showed normal LV function and TSH as previously  normal.  We did place the patient on Coumadin and he underwent  cardioversion on November 10, 2007.  He converted to sinus rhythm, but  then reverted to atrial fibrillation.  A repeat attempt also established  sinus rhythm, but again he reverted to atrial fibrillation.  Since that  time, he continues to have dyspnea on exertion.  There is no orthopnea  or PND, but he has had some pedal edema.  There is no chest pain,  palpitations, or syncope.   MEDICATIONS INCLUDE:  1. Synthroid 100 mcg p.o. daily.  2. Coumadin as directed.  3. Crestor 5 mg p.o. daily.  4. Voltaren 75 mg p.o. b.i.d.  5. Cardizem 360 mg p.o. daily.  6. Lasix 20 mg p.o. daily.   PHYSICAL EXAMINATION:  VITAL SIGNS:  Shows a blood pressure of 130/80  and his pulse is 76.  He weighs 293 pounds.  HEENT:  Normal.  NECK:  Supple.  CHEST:  Clear.  CARDIOVASCULAR:  Irregular rhythm.  ABDOMEN:  Shows no tenderness.  EXTREMITIES:  Showed trace edema.   His electrocardiogram shows atrial fibrillation at a rate of 76.  The  axis is normal.  There are nonspecific ST changes.   DIAGNOSES:  1. Recurrent atrial fibrillation - I have reviewed the patient's INRs      and they have been therapeutic since August.  He did not hold sinus      rhythm on his own.  We have discussed the options today including      rate  control/anticoagulation versus antiarrhythmic and repeat      attempts at cardioversion.  He does have dyspnea on exertion.      Given his symptoms, I think we should try again to establish sinus.      We will therefore begin flecainide 100 mg p.o. b.i.d.  We will      proceed with cardioversion on Thursday of this week.  Once we have      performed that, hopefully the flecainide will hold sinus rhythm.      If not, then we will refer him to Dr. Johney [name] for consideration of      atrial fibrillation ablation.  He will continue on his Cardizem and      Coumadin.  2. Dyspnea - Most likely secondary to atrial fibrillation with      possible component of obesity.  3. Hypertension - His blood pressure is well controlled on his present  medications.  4. Hypothyroidism.  5. History of no coronary artery disease on previous catheterization.   We will see him back in 4 weeks after his cardioversion.     Madolyn Frieze Jens Som, MD, [facility]  Electronically Signed    BSC/MedQ  DD: 12/08/2007  DT: 12/08/2007  Job #: [id number]

## 2010-07-11 NOTE — Assessment & Plan Note (Signed)
[facility]                            CARDIOLOGY OFFICE NOTE   NAME:[name] [name], CODI KERTZ                        MRN:          [id number]  DATE:11/05/2007                            DOB:          10/15/1940    HISTORY OF PRESENT ILLNESS:  [name] is a pleasant 70 year old  gentleman who has recent diagnosis of atrial fibrillation.  Note, a  previous catheterization in July 2006 showed no coronary artery disease  and an echocardiogram on September 10, 2007 showed normal LV function.  A TSH  previously has been normal as well.  Since I last saw him, he has  noticed increased dyspnea on exertion.  There is no orthopnea, PND, but  there is increased pedal edema.  He has not had chest pain,  palpitations, or syncope.  Note, his shortness of breath is not  associated with productive cough or chest pain.  There is also no  diaphoresis.   MEDICATIONS:  1. Synthroid 100 mcg p.o. daily.  2. Coumadin as directed.  3. Cardizem CD 240 mg p.o. daily.  4. Crestor 5 mg p.o. daily.  5. Voltaren 75 mg daily.   PHYSICAL EXAMINATION:  VITAL SIGNS:  Blood pressure of 148/95 and his  pulse 64.  He weighs 293 pounds, which compares to 289 on September 11, 2007.  HEENT:  Normal.  NECK:  Supple.  CHEST:  Clear.  CARDIOVASCULAR:  Irregular rhythm.  ABDOMEN:  Abdominal exam shows no tenderness.  EXTREMITIES:  Show 1+ edema to the knees bilaterally.   DIAGNOSES:  1. Atrial fibrillation - [name] continues to be in atrial      fibrillation and his rate appears to be controlled on his Cardizem,      which we will continue.  I have reviewed his INRs and they have      been therapeutic since September 19, 2007.  He is 3.8 today.  We      therefore can proceed with an elective cardioversion.  I have felt      previously that he was asymptomatic with his atrial fibrillation,      but I am concerned that his dyspnea and pedal edema may be      secondary to the atrial fibrillation.  Hopefully,  this will improve      if we can establish sinus rhythm.  He will continue on Coumadin      with goal INR of 2-3.  2. Dyspnea - As per atrial fibrillation.  3. Hypertension - His blood pressure is elevated today.  Given his      edema, adding a diuretic (Lasix 20 mg p.o. daily).  We will check      BMET in 1-week to follow his potassium and renal function.  I will      adjust his blood pressure regimen further as needed.  4. Hypothyroidism - Continue his Synthroid.  5. History of no obstructive coronary artery disease on previous      catheterization.   We will arrange for him to return in 4 weeks.  In the meantime we  will  proceed with cardioversion.     Madolyn Frieze Jens Som, MD, [facility]  Electronically Signed    BSC/MedQ  DD: 11/05/2007  DT: 11/05/2007  Job #: [id number]   cc:   Delaney Meigs, M.D.

## 2010-07-14 NOTE — Cardiovascular Report (Signed)
NAMEBYNUM, MCCULLARS                 ACCOUNT NO.:  [id number]   MEDICAL RECORD NO.:  [id number]          PATIENT TYPE:  OIB   LOCATION:  6501                         FACILITY:  [facility]   PHYSICIAN:  Charlton Haws, M.D.     DATE OF BIRTH:  09-02-1940   DATE OF PROCEDURE:  09/25/2004  DATE OF DISCHARGE:                              CARDIAC CATHETERIZATION   PROCEDURE:  Coronary arteriography.   INDICATIONS:  A 70 year old patient with coronary risk factors and chest  pain.   Standard catheterization was done with 4-French catheters from right femoral  artery.  Left main coronary artery was normal.   Left anterior descending artery was normal.  There were three large diagonal  branches.  The distal LAD was a small vessel that tapered but I believe this  was due to the fairly large diagonal branches that took off before the  distal vessel.   There were no discrete stenoses.   The circumflex coronary artery was extremely small which is a very small AV  groove branch.   Flush injections of the aortic root did not reveal any anomalous circumflex  artery nor was there any evidence of an anomalous circumflex with injection  of the right cusp.   The right coronary artery was large and dominant.  It was normal.   RAO VENTRICULOGRAPHY:  RAO ventriculography was normal.  EF was 60%.  There  was never across aortic valve and no MR.   Aortic pressure is 150/80, LV pressure is 150/=10.   The patient has no critical coronary disease.  His LV function is normal.  His ambient PVCs may be secondary to hypertension.  He is now on a beta-  blocker which he will probably continue for hypertension and PVCs.   The patient will have a follow-up carotid duplex in our office since he has  been having some dizziness and left arm weakness and we will be able to rule  out vertebrobasilar steel and assess his carotids.  He will then follow up  with Dr. Jens Som.   After this visit I suspect he will return  to Dr. Lysbeth Galas for his primary care  needs and any further work-up which may include his GI tract.       PN/MEDQ  D:  09/25/2004  T:  09/25/2004  Job:  [id number]   cc:   Delaney Meigs, M.D.  [address]  [place]  [place] [address]  Fax: [phone]   Olga Millers, M.D. [facility]

## 2010-07-19 ENCOUNTER — Ambulatory Visit (HOSPITAL_COMMUNITY)
Admission: RE | Admit: 2010-07-19 | Discharge: 2010-07-19 | Disposition: A | Discharge status: Home / Self Care | Payer: Medicare Other | Source: Ambulatory Visit | Attending: Internal Medicine | Admitting: Internal Medicine

## 2010-07-19 ENCOUNTER — Encounter (HOSPITAL_BASED_OUTPATIENT_CLINIC_OR_DEPARTMENT_OTHER): Payer: Medicare Other | Admitting: Internal Medicine

## 2010-07-19 DIAGNOSIS — Z1211 Encounter for screening for malignant neoplasm of colon: Secondary | ICD-10-CM

## 2010-07-19 DIAGNOSIS — Z79899 Other long term (current) drug therapy: Secondary | ICD-10-CM | POA: Insufficient documentation

## 2010-07-19 DIAGNOSIS — I10 Essential (primary) hypertension: Secondary | ICD-10-CM | POA: Insufficient documentation

## 2010-07-19 DIAGNOSIS — Z7982 Long term (current) use of aspirin: Secondary | ICD-10-CM | POA: Insufficient documentation

## 2010-07-19 DIAGNOSIS — K644 Residual hemorrhoidal skin tags: Secondary | ICD-10-CM

## 2010-07-19 DIAGNOSIS — K5730 Diverticulosis of large intestine without perforation or abscess without bleeding: Secondary | ICD-10-CM | POA: Insufficient documentation

## 2010-08-07 NOTE — Op Note (Signed)
  [name] [name], [name] [name]                 ACCOUNT NO.:  [id number]  MEDICAL RECORD NO.:  [id number]           PATIENT TYPE:  O  LOCATION:  DAYP                          FACILITY:  [facility]  PHYSICIAN:  Lionel December, M.D.    DATE OF BIRTH:  [birth year]  DATE OF PROCEDURE:  07/19/2010 DATE OF DISCHARGE:                              OPERATIVE REPORT   PROCEDURE:  Colonoscopy.  INDICATION:  Tarron is a 70 year old Caucasian male who is undergoing his first average risk screening colonoscopy.  Procedure risks were reviewed with the patient.  Informed consent was obtained.  MEDICATIONS FOR CONSCIOUS:  Sedation Demerol 50 mg IV and Versed 4 mg IV.  FINDINGS:  Procedure performed in endoscopy suite.  The patient's vital signs and O2 sat were monitored during the procedure and remained stable.  Since the patient has had both knees replaced within the last 1 year he was given antimicrobial prophylaxis of Cipro and Flagyl.  The patient was placed in left lateral recumbent position.  Rectal examination was performed.  No abnormality noted on external or digital exam.  Pentax videoscope was placed through rectum and advanced under vision into sigmoid colon where he had moderate number of diverticula with few more at descending and one at transverse colon.  Preparation was excellent.  Scope was passed into cecum which was identified by appendiceal stump and ileocecal valve.  Pictures were taken for the record.  Colonic mucosa was carefully examined as the scope was gradually withdrawn.  No polyps or tumor masses noted.  Rectal mucosa was normal.  Scope was retroflexed to examine anorectal junction and small hemorrhoids noted below the dentate line.  Endoscope was withdrawn.  Withdrawal time was 9 minutes.  The patient tolerated the procedure well.  FINAL DIAGNOSIS: 1. Examination performed to cecum. 2. Left-sided diverticulosis. 3. External hemorrhoids.  RECOMMENDATIONS: 1. High-fiber diet. 2.  Fiber supplement 3-4 g daily. 3. He should continue with yearly Hemoccults and consider next     screening exam in 10 years. 4. Also recommended to decrease a dose of his Voltaren if at all     possible to one a day.          ______________________________ Lionel December, M.D.     NR/MEDQ  D:  07/19/2010  T:  07/19/2010  Job:  [id number]  cc:   Delaney Meigs, M.D. Fax: [phone]  Electronically Signed by Lionel December M.D. on 08/07/2010 02:29:32 PM

## 2010-08-23 ENCOUNTER — Other Ambulatory Visit: Payer: Self-pay | Admitting: Cardiology

## 2010-10-23 ENCOUNTER — Other Ambulatory Visit: Payer: Self-pay | Admitting: Cardiology

## 2010-11-08 ENCOUNTER — Other Ambulatory Visit: Payer: Self-pay | Admitting: Cardiology

## 2010-11-27 LAB — BASIC METABOLIC PANEL
Calcium: 8.4
GFR calc Af Amer: >60
GFR calc non Af Amer: >60
Potassium: 4.2
Sodium: 140

## 2010-11-27 LAB — CBC
HCT: 41.8
Hemoglobin: 13.6
RBC: 4.9
RDW: 15.8 — ABNORMAL HIGH
WBC: 7.2

## 2010-11-27 LAB — PROTIME-INR: INR: 3.1 — ABNORMAL HIGH

## 2010-11-29 LAB — BASIC METABOLIC PANEL
Chloride: 105
Creatinine, Ser: 0.91
GFR calc Af Amer: >60
Potassium: 4.2
Sodium: 139

## 2010-11-29 LAB — PROTIME-INR: Prothrombin Time: 34.6 — ABNORMAL HIGH

## 2010-11-29 LAB — CBC
Hemoglobin: 13.2
MCV: 84.5
RBC: 4.69
WBC: 7.1

## 2010-12-08 LAB — BASIC METABOLIC PANEL
CO2: 27
Calcium: 9
GFR calc Af Amer: >60
GFR calc non Af Amer: >60
Potassium: 4.2
Sodium: 140

## 2010-12-08 LAB — POCT HEMOGLOBIN-HEMACUE: Hemoglobin: 14.1

## 2011-01-01 ENCOUNTER — Encounter: Payer: Self-pay | Admitting: *Deleted

## 2011-01-02 ENCOUNTER — Ambulatory Visit (INDEPENDENT_AMBULATORY_CARE_PROVIDER_SITE_OTHER): Payer: Medicare Other | Admitting: Cardiology

## 2011-01-02 ENCOUNTER — Encounter: Payer: Self-pay | Admitting: Cardiology

## 2011-01-02 DIAGNOSIS — I4891 Unspecified atrial fibrillation: Secondary | ICD-10-CM

## 2011-01-02 DIAGNOSIS — I10 Essential (primary) hypertension: Secondary | ICD-10-CM

## 2011-01-02 NOTE — Patient Instructions (Signed)
Your physician wants you to follow-up in: 1 year You will receive a reminder letter in the mail two months in advance. If you don't receive a letter, please call our office to schedule the follow-up appointment.   xarelto or pradaxa   To replace coumadin

## 2011-01-02 NOTE — Assessment & Plan Note (Signed)
Blood pressure mildly elevated. We will follow this and increase medications as needed. 

## 2011-01-02 NOTE — Progress Notes (Signed)
HPI:[name] [name] is a pleasant  gentleman who has a history of paroxysmal atrial fibrillation.  Note, a previous catheterization in July 2006 showed no coronary artery disease and normal LV function.  His most recent echocardiogram performed on September 10, 2007, showed normal LV function, mildly calcified aortic valve, and mild left atrial enlargement.  TSH also have been in normal. The patient is also on flecainide to help maintain sinus rhythm. I last saw him in Nov 2011. Since then he has dyspnea with more extreme activities but not with routine activities. It is relieved with rest. It is not associated with chest pain. There is no orthopnea or PND. There is no palpitations or syncope.  Current Outpatient Prescriptions  Medication Sig Dispense Refill  . aspirin 325 MG EC tablet Take 325 mg by mouth daily.        Marland Kitchen diltiazem (TIAZAC) 360 MG 24 hr capsule TAKE 1 CAPSULE DAILY  90 capsule  2  . flecainide (TAMBOCOR) 100 MG tablet TAKE 1 TABLET TWICE A DAY  180 tablet  2  . levothyroxine (SYNTHROID, LEVOTHROID) 100 MCG tablet Take 100 mcg by mouth daily.        Marland Kitchen lisinopril (PRINIVIL,ZESTRIL) 20 MG tablet TAKE 1 TABLET DAILY  90 tablet  1  . rosuvastatin (CRESTOR) 5 MG tablet Take 5 mg by mouth daily.           Past Medical History  Diagnosis Date  . HYPERTENSION   . Atrial fibrillation   . ROTATOR CUFF TEAR   . HYPOTHYROIDISM   . Arthritis   . Hyperlipidemia   . Hyperthyroidism     Past Surgical History  Procedure Date  . Appendectomy   . Rotator cuff repair   . Knee surgery   . Shoulder surgery     History   Social History  . Marital Status: Married    Spouse Name: N/A    Number of Children: N/A  . Years of Education: N/A   Occupational History  . Not on file.   Social History Main Topics  . Smoking status: Never Smoker   . Smokeless tobacco: Not on file  . Alcohol Use: Not on file  . Drug Use: Not on file  . Sexually Active: Not on file   Other Topics Concern  . Not on  file   Social History Narrative        ROS: no fevers or chills, productive cough, hemoptysis, dysphasia, odynophagia, melena, hematochezia, dysuria, hematuria, rash, seizure activity, orthopnea, PND, pedal edema, claudication. Remaining systems are negative.  Physical Exam: Well-developed obese in no acute distress.  Skin is warm and dry.  HEENT is normal.  Neck is supple. No thyromegaly.  Chest is clear to auscultation with normal expansion.  Cardiovascular exam is regular rate and rhythm.  Abdominal exam nontender or distended. No masses palpated. Extremities show no edema. neuro grossly intact  ECG NSR, no ST changes

## 2011-01-02 NOTE — Assessment & Plan Note (Signed)
Patient remains in sinus rhythm. Continue Cardizem and flecainide. He has embolic risk factors of hypertension and age greater than 22. Long discussion today concerning risk of CVA and options for anticoagulation. We reviewed Coumadin, xeralto, and pradaxa. He will check with his insurance company to see if they will cover  the medications. He will contact us with his decision concerning which one he would like to take. We will discontinue aspirin at that time.

## 2011-01-25 ENCOUNTER — Telehealth: Payer: Self-pay | Admitting: Cardiology

## 2011-01-25 MED ORDER — LISINOPRIL 20 MG PO TABS: 20.0000 mg | ORAL_TABLET | Freq: Every day | ORAL | Status: DC

## 2011-01-25 MED ORDER — ROSUVASTATIN CALCIUM 5 MG PO TABS: 5.0000 mg | ORAL_TABLET | Freq: Every day | ORAL | Status: DC

## 2011-01-25 MED ORDER — FLECAINIDE ACETATE 100 MG PO TABS: 100.0000 mg | ORAL_TABLET | Freq: Two times a day (BID) | ORAL | Status: DC

## 2011-01-25 MED ORDER — WARFARIN SODIUM 5 MG PO TABS: ORAL_TABLET | ORAL | Status: DC

## 2011-01-25 MED ORDER — DILTIAZEM HCL ER BEADS 360 MG PO CP24: 360.0000 mg | ORAL_CAPSULE | Freq: Every day | ORAL | Status: DC

## 2011-01-25 NOTE — Telephone Encounter (Signed)
New message:  He has checked with his insurance company and can't afford anything except Warfarin.  Please call and discuss with him.  He will not be home until after 1:00.  This will need to be called into [facility] in [place].  He needs ALL medications he is on faxed into [place] as well because of insurance changes. PCP can test his PT on Tues and Thursday and he will not have to come to our office.

## 2011-01-25 NOTE — Telephone Encounter (Signed)
Spoke with pt, he is only going to be able to afford the warfarin. He can have his protime checked with dr nyland. Will forward for dr Jens Som review for coumadin starting dose and get pt set up for INR check [name] [name]

## 2011-01-25 NOTE — Telephone Encounter (Signed)
Spoke with pt, he will start the coumadin on sat. He is aware to stop the asa. He will contact dr nyland's office to get a protime checked on Tuesday. He will call with problems Deliah Goody

## 2011-01-25 NOTE — Telephone Encounter (Signed)
Coumadin 5 mg daily; INR on day 4 and then at direction of primary care; dc asa Olga Millers

## 2012-01-08 ENCOUNTER — Other Ambulatory Visit: Payer: Self-pay | Admitting: Cardiology

## 2012-01-08 NOTE — Telephone Encounter (Signed)
Pt calling re diltiazem was $9 for a 90 day supply has now went  to $149 for 90 day supply, can he get this change to something cheaper? pls call [phone]

## 2012-01-08 NOTE — Telephone Encounter (Signed)
His cardizem should be generic; please check on this [name] [name]

## 2012-01-08 NOTE — Telephone Encounter (Signed)
Please advise 

## 2012-01-09 NOTE — Telephone Encounter (Signed)
Spoke with pt, diltiazem discussed

## 2012-02-13 ENCOUNTER — Other Ambulatory Visit: Payer: Self-pay | Admitting: Cardiology

## 2012-02-28 ENCOUNTER — Other Ambulatory Visit: Payer: Self-pay | Admitting: Cardiology

## 2013-02-26 HISTORY — PX: EYE SURGERY: SHX253

## 2014-01-29 ENCOUNTER — Other Ambulatory Visit: Payer: Self-pay

## 2014-01-29 ENCOUNTER — Encounter (HOSPITAL_COMMUNITY)
Admission: RE | Admit: 2014-01-29 | Discharge: 2014-01-29 | Disposition: A | Discharge status: Home / Self Care | Payer: Medicare HMO | Source: Ambulatory Visit | Attending: Ophthalmology | Admitting: Ophthalmology

## 2014-01-29 ENCOUNTER — Encounter (HOSPITAL_COMMUNITY): Payer: Self-pay

## 2014-01-29 DIAGNOSIS — Z01818 Encounter for other preprocedural examination: Secondary | ICD-10-CM | POA: Diagnosis not present

## 2014-01-29 LAB — BASIC METABOLIC PANEL
Anion gap: 13 (ref 5–15)
BUN: 12 mg/dL (ref 6–23)
CALCIUM: 8.9 mg/dL (ref 8.4–10.5)
CO2: 25 meq/L (ref 19–32)
Chloride: 102 mEq/L (ref 96–112)
Creatinine, Ser: 0.88 mg/dL (ref 0.50–1.35)
GFR calc Af Amer: >90 mL/min (ref >90)
GFR, EST NON AFRICAN AMERICAN: 84 mL/min — AB (ref >90)
GLUCOSE: 112 mg/dL — AB (ref 70–99)
Potassium: 4.1 mEq/L (ref 3.7–5.3)
Sodium: 140 mEq/L (ref 137–147)

## 2014-01-29 LAB — HEMOGLOBIN AND HEMATOCRIT, BLOOD
HEMATOCRIT: 43.7 % (ref 39.0–52.0)
Hemoglobin: 14.8 g/dL (ref 13.0–17.0)

## 2014-01-29 NOTE — Pre-Procedure Instructions (Signed)
Patient given information to sign up for my chart at home. 

## 2014-01-29 NOTE — Progress Notes (Signed)
   01/29/14 0849  OBSTRUCTIVE SLEEP APNEA  Have you ever been diagnosed with sleep apnea through a sleep study? No  Do you snore loudly (loud enough to be heard through closed doors)?  1  Do you often feel tired, fatigued, or sleepy during the daytime? 0  Has anyone observed you stop breathing during your sleep? 1  Do you have, or are you being treated for high blood pressure? 1  BMI more than 35 kg/m2? 1  Age over 71 years old? 1  Neck circumference greater than 40 cm/16 inches? 1  Gender: 1  Obstructive Sleep Apnea Score 7  Score 4 or greater  Results sent to PCP

## 2014-01-29 NOTE — Patient Instructions (Signed)
Your procedure is scheduled on: 02/02/2014  Report to [facility] at  95  AM.  Call this number if you have problems the morning of surgery: [phone]   Do not eat food or drink liquids :After Midnight.      Take these medicines the morning of surgery with A SIP OF WATER: diltiazem, tambocor, levothyroxine, lisinopril   Do not wear jewelry, make-up or nail polish.  Do not wear lotions, powders, or perfumes.   Do not shave 48 hours prior to surgery.  Do not bring valuables to the hospital.  Contacts, dentures or bridgework may not be worn into surgery.  Leave suitcase in the car. After surgery it may be brought to your room.  For patients admitted to the hospital, checkout time is 11:00 AM the day of discharge.   Patients discharged the day of surgery will not be allowed to drive home.  :     Please read over the following fact sheets that you were given: Coughing and Deep Breathing, Surgical Site Infection Prevention, Anesthesia Post-op Instructions and Care and Recovery After Surgery    Cataract A cataract is a clouding of the lens of the eye. When a lens becomes cloudy, vision is reduced based on the degree and nature of the clouding. Many cataracts reduce vision to some degree. Some cataracts make people more near-sighted as they develop. Other cataracts increase glare. Cataracts that are ignored and become worse can sometimes look white. The white color can be seen through the pupil. CAUSES   Aging. However, cataracts may occur at any age, even in newborns.   Certain drugs.   Trauma to the eye.   Certain diseases such as diabetes.   Specific eye diseases such as chronic inflammation inside the eye or a sudden attack of a rare form of glaucoma.   Inherited or acquired medical problems.  SYMPTOMS   Gradual, progressive drop in vision in the affected eye.   Severe, rapid visual loss. This most often happens when trauma is the cause.  DIAGNOSIS  To detect a cataract, an eye  doctor examines the lens. Cataracts are best diagnosed with an exam of the eyes with the pupils enlarged (dilated) by drops.  TREATMENT  For an early cataract, vision may improve by using different eyeglasses or stronger lighting. If that does not help your vision, surgery is the only effective treatment. A cataract needs to be surgically removed when vision loss interferes with your everyday activities, such as driving, reading, or watching TV. A cataract may also have to be removed if it prevents examination or treatment of another eye problem. Surgery removes the cloudy lens and usually replaces it with a substitute lens (intraocular lens, IOL).  At a time when both you and your doctor agree, the cataract will be surgically removed. If you have cataracts in both eyes, only one is usually removed at a time. This allows the operated eye to heal and be out of danger from any possible problems after surgery (such as infection or poor wound healing). In rare cases, a cataract may be doing damage to your eye. In these cases, your caregiver may advise surgical removal right away. The vast majority of people who have cataract surgery have better vision afterward. HOME CARE INSTRUCTIONS  If you are not planning surgery, you may be asked to do the following:  Use different eyeglasses.   Use stronger or brighter lighting.   Ask your eye doctor about reducing your medicine dose or  changing medicines if it is thought that a medicine caused your cataract. Changing medicines does not make the cataract go away on its own.   Become familiar with your surroundings. Poor vision can lead to injury. Avoid bumping into things on the affected side. You are at a higher risk for tripping or falling.   Exercise extreme care when driving or operating machinery.   Wear sunglasses if you are sensitive to bright light or experiencing problems with glare.  SEEK IMMEDIATE MEDICAL CARE IF:   You have a worsening or sudden  vision loss.   You notice redness, swelling, or increasing pain in the eye.   You have a fever.  Document Released: 02/12/2005 Document Revised: 02/01/2011 Document Reviewed: 10/06/2010 [employer] Patient Information 2012 [place].PATIENT INSTRUCTIONS POST-ANESTHESIA  IMMEDIATELY FOLLOWING SURGERY:  Do not drive or operate machinery for the first twenty four hours after surgery.  Do not make any important decisions for twenty four hours after surgery or while taking narcotic pain medications or sedatives.  If you develop intractable nausea and vomiting or a severe headache please notify your doctor immediately.  FOLLOW-UP:  Please make an appointment with your surgeon as instructed. You do not need to follow up with anesthesia unless specifically instructed to do so.  WOUND CARE INSTRUCTIONS (if applicable):  Keep a dry clean dressing on the anesthesia/puncture wound site if there is drainage.  Once the wound has quit draining you may leave it open to air.  Generally you should leave the bandage intact for twenty four hours unless there is drainage.  If the epidural site drains for more than 36-48 hours please call the anesthesia department.  QUESTIONS?:  Please feel free to call your physician or the hospital operator if you have any questions, and they will be happy to assist you.

## 2014-02-01 MED ORDER — CYCLOPENTOLATE-PHENYLEPHRINE OP SOLN OPTIME - NO CHARGE
OPHTHALMIC | Status: AC
  Filled 2014-02-01: qty 2

## 2014-02-01 MED ORDER — KETOROLAC TROMETHAMINE 0.5 % OP SOLN
OPHTHALMIC | Status: AC
  Filled 2014-02-01: qty 5

## 2014-02-01 MED ORDER — PHENYLEPHRINE HCL 2.5 % OP SOLN
OPHTHALMIC | Status: AC
  Filled 2014-02-01: qty 15

## 2014-02-01 MED ORDER — TETRACAINE HCL 0.5 % OP SOLN
OPHTHALMIC | Status: AC
  Filled 2014-02-01: qty 2

## 2014-02-02 ENCOUNTER — Encounter (HOSPITAL_COMMUNITY)
Admission: RE | Disposition: A | Discharge status: Home / Self Care | Payer: Self-pay | Source: Ambulatory Visit | Attending: Ophthalmology

## 2014-02-02 ENCOUNTER — Ambulatory Visit (HOSPITAL_COMMUNITY): Payer: Medicare HMO | Admitting: Anesthesiology

## 2014-02-02 ENCOUNTER — Encounter (HOSPITAL_COMMUNITY): Payer: Self-pay | Admitting: *Deleted

## 2014-02-02 ENCOUNTER — Ambulatory Visit (HOSPITAL_COMMUNITY)
Admission: RE | Admit: 2014-02-02 | Discharge: 2014-02-02 | Disposition: A | Discharge status: Home / Self Care | Payer: Medicare HMO | Source: Ambulatory Visit | Attending: Ophthalmology | Admitting: Ophthalmology

## 2014-02-02 DIAGNOSIS — H2511 Age-related nuclear cataract, right eye: Secondary | ICD-10-CM | POA: Diagnosis not present

## 2014-02-02 HISTORY — PX: CATARACT EXTRACTION W/PHACO: SHX586

## 2014-02-02 LAB — GLUCOSE, CAPILLARY: Glucose-Capillary: 117 mg/dL — ABNORMAL HIGH (ref 70–99)

## 2014-02-02 SURGERY — PHACOEMULSIFICATION, CATARACT, WITH IOL INSERTION
Anesthesia: Monitor Anesthesia Care | Laterality: Right

## 2014-02-02 MED ORDER — BSS IO SOLN
INTRAOCULAR | Status: DC | PRN
Start: 2014-02-02 — End: 2014-02-02
  Administered 2014-02-02: 15 mL

## 2014-02-02 MED ORDER — MIDAZOLAM HCL 2 MG/2ML IJ SOLN
1.0000 mg | INTRAMUSCULAR | Status: DC | PRN
  Administered 2014-02-02: 2 mg via INTRAVENOUS

## 2014-02-02 MED ORDER — KETOROLAC TROMETHAMINE 0.5 % OP SOLN
1.0000 [drp] | OPHTHALMIC | Status: AC
  Administered 2014-02-02 (×3): 1 [drp] via OPHTHALMIC

## 2014-02-02 MED ORDER — TETRACAINE HCL 0.5 % OP SOLN
1.0000 [drp] | OPHTHALMIC | Status: AC
  Administered 2014-02-02 (×3): 1 [drp] via OPHTHALMIC

## 2014-02-02 MED ORDER — PHENYLEPHRINE HCL 2.5 % OP SOLN
1.0000 [drp] | OPHTHALMIC | Status: AC
  Administered 2014-02-02 (×3): 1 [drp] via OPHTHALMIC

## 2014-02-02 MED ORDER — BSS IO SOLN
INTRAOCULAR | Status: DC | PRN
  Administered 2014-02-02: 500 mL

## 2014-02-02 MED ORDER — EPINEPHRINE HCL 1 MG/ML IJ SOLN
INTRAMUSCULAR | Status: AC
  Filled 2014-02-02: qty 1

## 2014-02-02 MED ORDER — MIDAZOLAM HCL 2 MG/2ML IJ SOLN
INTRAMUSCULAR | Status: AC
  Filled 2014-02-02: qty 2

## 2014-02-02 MED ORDER — TETRACAINE 0.5 % OP SOLN OPTIME - NO CHARGE
OPHTHALMIC | Status: DC | PRN
  Administered 2014-02-02: 1 [drp] via OPHTHALMIC

## 2014-02-02 MED ORDER — LACTATED RINGERS IV SOLN
INTRAVENOUS | Status: DC
  Administered 2014-02-02: 1000 mL via INTRAVENOUS

## 2014-02-02 MED ORDER — PROVISC 10 MG/ML IO SOLN
INTRAOCULAR | Status: DC | PRN
  Administered 2014-02-02: 0.85 mL via INTRAOCULAR

## 2014-02-02 MED ORDER — CYCLOPENTOLATE-PHENYLEPHRINE 0.2-1 % OP SOLN
1.0000 [drp] | OPHTHALMIC | Status: AC
  Administered 2014-02-02 (×3): 1 [drp] via OPHTHALMIC

## 2014-02-02 MED ORDER — FENTANYL CITRATE 0.05 MG/ML IJ SOLN
25.0000 ug | INTRAMUSCULAR | Status: AC
  Administered 2014-02-02 (×2): 25 ug via INTRAVENOUS

## 2014-02-02 MED ORDER — FENTANYL CITRATE 0.05 MG/ML IJ SOLN
INTRAMUSCULAR | Status: AC
  Filled 2014-02-02: qty 2

## 2014-02-02 SURGICAL SUPPLY — 25 items
CAPSULAR TENSION RING-AMO (OPHTHALMIC RELATED) IMPLANT
CLOTH BEACON ORANGE TIMEOUT ST (SAFETY) ×3 IMPLANT
EYE SHIELD UNIVERSAL CLEAR (GAUZE/BANDAGES/DRESSINGS) ×3 IMPLANT
GLOVE BIO SURGEON STRL SZ 6.5 (GLOVE) ×2 IMPLANT
GLOVE BIO SURGEONS STRL SZ 6.5 (GLOVE) ×1
GLOVE ECLIPSE 6.5 STRL STRAW (GLOVE) IMPLANT
GLOVE ECLIPSE 7.0 STRL STRAW (GLOVE) IMPLANT
GLOVE EXAM NITRILE LRG STRL (GLOVE) IMPLANT
GLOVE EXAM NITRILE MD LF STRL (GLOVE) ×3 IMPLANT
GLOVE SKINSENSE NS SZ6.5 (GLOVE)
GLOVE SKINSENSE STRL SZ6.5 (GLOVE) IMPLANT
HEALON 5 0.6 ML (INTRAOCULAR LENS) IMPLANT
KIT VITRECTOMY (OPHTHALMIC RELATED) IMPLANT
PAD ARMBOARD 7.5X6 YLW CONV (MISCELLANEOUS) ×3 IMPLANT
PROC W NO LENS (INTRAOCULAR LENS)
PROC W SPEC LENS (INTRAOCULAR LENS)
PROCESS W NO LENS (INTRAOCULAR LENS) IMPLANT
PROCESS W SPEC LENS (INTRAOCULAR LENS) IMPLANT
RETRACTOR IRIS SIGHTPATH (OPHTHALMIC RELATED) IMPLANT
RING MALYGIN (MISCELLANEOUS) IMPLANT
SIGHTPATH CAT PROC W REG LENS (Ophthalmic Related) ×3 IMPLANT
TAPE SURG TRANSPORE 1 IN (GAUZE/BANDAGES/DRESSINGS) ×1 IMPLANT
TAPE SURGICAL TRANSPORE 1 IN (GAUZE/BANDAGES/DRESSINGS) ×2
VISCOELASTIC ADDITIONAL (OPHTHALMIC RELATED) IMPLANT
WATER STERILE IRR 250ML POUR (IV SOLUTION) ×3 IMPLANT

## 2014-02-02 NOTE — Anesthesia Preprocedure Evaluation (Signed)
Anesthesia Evaluation  Patient identified by MRN, date of birth, ID band Patient awake    Reviewed: Allergy & Precautions, H&P , NPO status , Patient's Chart, lab work & pertinent test results  Airway Mallampati: II  TM Distance: >3 FB     Dental  (+) Partial Upper, Partial Lower   Pulmonary shortness of breath and with exertion,  breath sounds clear to auscultation        Cardiovascular hypertension, Pt. on medications + dysrhythmias Atrial Fibrillation Rhythm:Regular Rate:Normal     Neuro/Psych    GI/Hepatic negative GI ROS,   Endo/Other  diabetes, Type 2, Oral Hypoglycemic AgentsHypothyroidism Morbid obesity  Renal/GU      Musculoskeletal  (+) Arthritis -,   Abdominal   Peds  Hematology   Anesthesia Other Findings   Reproductive/Obstetrics                             Anesthesia Physical Anesthesia Plan  ASA: III  Anesthesia Plan: MAC   Post-op Pain Management:    Induction: Intravenous  Airway Management Planned: Nasal Cannula  Additional Equipment:   Intra-op Plan:   Post-operative Plan:   Informed Consent: I have reviewed the patients History and Physical, chart, labs and discussed the procedure including the risks, benefits and alternatives for the proposed anesthesia with the patient or authorized representative who has indicated his/her understanding and acceptance.     Plan Discussed with:   Anesthesia Plan Comments:         Anesthesia Quick Evaluation  

## 2014-02-02 NOTE — Discharge Instructions (Signed)
[name] [name]  02/02/2014           [facility] Instructions [place] [address]      1. Avoid closing eyes tightly. One often closes the eye tightly when laughing, talking, sneezing, coughing or if they feel irritated. At these times, you should be careful not to close your eyes tightly.  2. Instill eye drops as instructed. To instill drops in your eye, open it, look up and have someone gently pull the lower lid down and instill a couple of drops inside the lower lid.  3. Do not touch upper lid.  4. Take Advil or Tylenol for pain.  5. You may use either eye for near work, such as reading or sewing and you may watch television.  6. You may have your hair done at the beauty parlor at any time.  7. Wear dark glasses with or without your own glasses if you are in bright light.  8. Call our office at [phone] or [phone] if you have sharp pain in your eye or unusual symptoms.  9. Do not be concerned because vision in the operative eye is not good. It will not be good, no matter how successful the operation, until you get a special lens for it. Your old glasses will not be suited to the new eye that was operated on and you will not be ready for a new lens for about a month.  10. Follow up at the [facility] office. Today between 1:00 and 2:00pm    I have received a copy of the above instructions and will follow them.

## 2014-02-02 NOTE — H&P (Signed)
The patient was re examined and there is no change in the patients condition since the original H and P. 

## 2014-02-02 NOTE — Anesthesia Postprocedure Evaluation (Signed)
  Anesthesia Post-op Note  Patient: [name] [name]  Procedure(s) Performed: Procedure(s) (LRB): CATARACT EXTRACTION PHACO AND INTRAOCULAR LENS PLACEMENT (IOC) (Right)  Patient Location:  Short Stay  Anesthesia Type: MAC  Level of Consciousness: awake  Airway and Oxygen Therapy: Patient Spontanous Breathing  Post-op Pain: none  Post-op Assessment: Post-op Vital signs reviewed, Patient's Cardiovascular Status Stable, Respiratory Function Stable, Patent Airway, No signs of Nausea or vomiting and Pain level controlled  Post-op Vital Signs: Reviewed and stable  Complications: No apparent anesthesia complications

## 2014-02-02 NOTE — Transfer of Care (Signed)
Immediate Anesthesia Transfer of Care Note  Patient: [name] [name]  Procedure(s) Performed: Procedure(s) (LRB): CATARACT EXTRACTION PHACO AND INTRAOCULAR LENS PLACEMENT (IOC) (Right)  Patient Location: Shortstay  Anesthesia Type: MAC  Level of Consciousness: awake  Airway & Oxygen Therapy: Patient Spontanous Breathing   Post-op Assessment: Report given to PACU RN, Post -op Vital signs reviewed and stable and Patient moving all extremities  Post vital signs: Reviewed and stable  Complications: No apparent anesthesia complications

## 2014-02-02 NOTE — Op Note (Signed)
Patient brought to the operating room and prepped and draped in the usual manner.  Lid speculum inserted in right eye.  Stab incision made at the twelve o'clock position.  Provisc instilled in the anterior chamber.   A 2.4 mm. Stab incision was made temporally.  An anterior capsulotomy was done with a bent 25 gauge needle.  The nucleus was hydrodissected.  The Phaco tip was inserted in the anterior chamber and the nucleus was emulsified.  CDE was 12.17.  The cortical material was then removed with the I and A tip.  Posterior capsule was the polished.  The anterior chamber was deepened with Provisc.  A 18.5 Alcon SN60WF IOL was then inserted in the capsular bag.  Provisc was then removed with the I and A tip.  The wound was then hydrated.  Patient sent to the Recovery Room in good condition with follow up in my office.  Preoperative Diagnosis:  Nuclear Cataract OD Postoperative Diagnosis:  Same Procedure name: Kelman Phacoemulsification OD with IOL

## 2014-02-02 NOTE — Anesthesia Procedure Notes (Signed)
Procedure Name: MAC Date/Time: 02/02/2014 7:42 AM Performed by: Vista Deck Pre-anesthesia Checklist: Patient identified, Emergency Drugs available, Suction available, Timeout performed and Patient being monitored Patient Re-evaluated:Patient Re-evaluated prior to inductionOxygen Delivery Method: Nasal Cannula

## 2014-02-03 ENCOUNTER — Encounter (HOSPITAL_COMMUNITY): Payer: Self-pay | Admitting: Ophthalmology

## 2014-02-10 ENCOUNTER — Encounter (HOSPITAL_COMMUNITY)
Admission: RE | Admit: 2014-02-10 | Discharge: 2014-02-10 | Disposition: A | Discharge status: Home / Self Care | Payer: Medicare HMO | Source: Ambulatory Visit | Attending: Ophthalmology | Admitting: Ophthalmology

## 2014-02-10 ENCOUNTER — Encounter (HOSPITAL_COMMUNITY): Payer: Self-pay

## 2014-02-15 MED ORDER — KETOROLAC TROMETHAMINE 0.5 % OP SOLN
OPHTHALMIC | Status: AC
  Filled 2014-02-15: qty 5

## 2014-02-15 MED ORDER — PHENYLEPHRINE HCL 2.5 % OP SOLN
OPHTHALMIC | Status: AC
  Filled 2014-02-15: qty 15

## 2014-02-15 MED ORDER — TETRACAINE HCL 0.5 % OP SOLN
OPHTHALMIC | Status: AC
  Filled 2014-02-15: qty 2

## 2014-02-15 MED ORDER — CYCLOPENTOLATE-PHENYLEPHRINE OP SOLN OPTIME - NO CHARGE
OPHTHALMIC | Status: AC
  Filled 2014-02-15: qty 2

## 2014-02-16 ENCOUNTER — Encounter (HOSPITAL_COMMUNITY): Payer: Self-pay | Admitting: *Deleted

## 2014-02-16 ENCOUNTER — Ambulatory Visit (HOSPITAL_COMMUNITY)
Admission: RE | Admit: 2014-02-16 | Discharge: 2014-02-16 | Disposition: A | Discharge status: Home / Self Care | Payer: Medicare HMO | Source: Ambulatory Visit | Attending: Ophthalmology | Admitting: Ophthalmology

## 2014-02-16 ENCOUNTER — Ambulatory Visit (HOSPITAL_COMMUNITY): Payer: Medicare HMO | Admitting: Anesthesiology

## 2014-02-16 ENCOUNTER — Encounter (HOSPITAL_COMMUNITY)
Admission: RE | Disposition: A | Discharge status: Home / Self Care | Payer: Self-pay | Source: Ambulatory Visit | Attending: Ophthalmology

## 2014-02-16 DIAGNOSIS — M1990 Unspecified osteoarthritis, unspecified site: Secondary | ICD-10-CM | POA: Insufficient documentation

## 2014-02-16 DIAGNOSIS — E119 Type 2 diabetes mellitus without complications: Secondary | ICD-10-CM | POA: Diagnosis not present

## 2014-02-16 DIAGNOSIS — I10 Essential (primary) hypertension: Secondary | ICD-10-CM | POA: Diagnosis not present

## 2014-02-16 DIAGNOSIS — H2512 Age-related nuclear cataract, left eye: Secondary | ICD-10-CM | POA: Insufficient documentation

## 2014-02-16 HISTORY — PX: CATARACT EXTRACTION W/PHACO: SHX586

## 2014-02-16 LAB — GLUCOSE, CAPILLARY: Glucose-Capillary: 108 mg/dL — ABNORMAL HIGH (ref 70–99)

## 2014-02-16 SURGERY — PHACOEMULSIFICATION, CATARACT, WITH IOL INSERTION
Anesthesia: Monitor Anesthesia Care | Site: Eye | Laterality: Left

## 2014-02-16 MED ORDER — MIDAZOLAM HCL 2 MG/2ML IJ SOLN
INTRAMUSCULAR | Status: AC
  Filled 2014-02-16: qty 2

## 2014-02-16 MED ORDER — FENTANYL CITRATE 0.05 MG/ML IJ SOLN
25.0000 ug | INTRAMUSCULAR | Status: AC
  Administered 2014-02-16 (×2): 25 ug via INTRAVENOUS

## 2014-02-16 MED ORDER — EPINEPHRINE HCL 1 MG/ML IJ SOLN
INTRAMUSCULAR | Status: AC
  Filled 2014-02-16: qty 1

## 2014-02-16 MED ORDER — PHENYLEPHRINE HCL 2.5 % OP SOLN
1.0000 [drp] | OPHTHALMIC | Status: AC
  Administered 2014-02-16 (×3): 1 [drp] via OPHTHALMIC

## 2014-02-16 MED ORDER — BSS IO SOLN
INTRAOCULAR | Status: DC | PRN
  Administered 2014-02-16: 15 mL via INTRAOCULAR

## 2014-02-16 MED ORDER — PROVISC 10 MG/ML IO SOLN
INTRAOCULAR | Status: DC | PRN
  Administered 2014-02-16: 0.85 mL via INTRAOCULAR

## 2014-02-16 MED ORDER — TETRACAINE HCL 0.5 % OP SOLN
1.0000 [drp] | OPHTHALMIC | Status: AC
  Administered 2014-02-16 (×3): 1 [drp] via OPHTHALMIC

## 2014-02-16 MED ORDER — MIDAZOLAM HCL 2 MG/2ML IJ SOLN
1.0000 mg | INTRAMUSCULAR | Status: DC | PRN
  Administered 2014-02-16: 2 mg via INTRAVENOUS

## 2014-02-16 MED ORDER — FENTANYL CITRATE 0.05 MG/ML IJ SOLN
INTRAMUSCULAR | Status: AC
  Filled 2014-02-16: qty 2

## 2014-02-16 MED ORDER — TETRACAINE 0.5 % OP SOLN OPTIME - NO CHARGE
OPHTHALMIC | Status: DC | PRN
  Administered 2014-02-16: 2 [drp] via OPHTHALMIC

## 2014-02-16 MED ORDER — CYCLOPENTOLATE-PHENYLEPHRINE 0.2-1 % OP SOLN
1.0000 [drp] | OPHTHALMIC | Status: AC
  Administered 2014-02-16 (×3): 1 [drp] via OPHTHALMIC

## 2014-02-16 MED ORDER — LACTATED RINGERS IV SOLN
INTRAVENOUS | Status: DC | PRN
  Administered 2014-02-16: 08:00:00 via INTRAVENOUS

## 2014-02-16 MED ORDER — EPINEPHRINE HCL 1 MG/ML IJ SOLN
INTRAOCULAR | Status: DC | PRN
  Administered 2014-02-16: 09:00:00

## 2014-02-16 MED ORDER — KETOROLAC TROMETHAMINE 0.5 % OP SOLN
1.0000 [drp] | OPHTHALMIC | Status: AC
  Administered 2014-02-16 (×3): 1 [drp] via OPHTHALMIC

## 2014-02-16 MED ORDER — LACTATED RINGERS IV SOLN
INTRAVENOUS | Status: DC
  Administered 2014-02-16: 1000 mL via INTRAVENOUS

## 2014-02-16 SURGICAL SUPPLY — 25 items
CAPSULAR TENSION RING-AMO (OPHTHALMIC RELATED) IMPLANT
CLOTH BEACON ORANGE TIMEOUT ST (SAFETY) ×3 IMPLANT
EYE SHIELD UNIVERSAL CLEAR (GAUZE/BANDAGES/DRESSINGS) ×3 IMPLANT
GLOVE BIO SURGEON STRL SZ 6.5 (GLOVE) ×2 IMPLANT
GLOVE BIO SURGEONS STRL SZ 6.5 (GLOVE) ×1
GLOVE ECLIPSE 6.5 STRL STRAW (GLOVE) IMPLANT
GLOVE ECLIPSE 7.0 STRL STRAW (GLOVE) IMPLANT
GLOVE EXAM NITRILE LRG STRL (GLOVE) IMPLANT
GLOVE EXAM NITRILE MD LF STRL (GLOVE) ×3 IMPLANT
GLOVE SKINSENSE NS SZ6.5 (GLOVE)
GLOVE SKINSENSE STRL SZ6.5 (GLOVE) IMPLANT
HEALON 5 0.6 ML (INTRAOCULAR LENS) IMPLANT
KIT VITRECTOMY (OPHTHALMIC RELATED) IMPLANT
PAD ARMBOARD 7.5X6 YLW CONV (MISCELLANEOUS) ×3 IMPLANT
PROC W NO LENS (INTRAOCULAR LENS)
PROC W SPEC LENS (INTRAOCULAR LENS)
PROCESS W NO LENS (INTRAOCULAR LENS) IMPLANT
PROCESS W SPEC LENS (INTRAOCULAR LENS) IMPLANT
RETRACTOR IRIS SIGHTPATH (OPHTHALMIC RELATED) IMPLANT
RING MALYGIN (MISCELLANEOUS) IMPLANT
SIGHTPATH CAT PROC W REG LENS (Ophthalmic Related) ×3 IMPLANT
TAPE SURG TRANSPORE 1 IN (GAUZE/BANDAGES/DRESSINGS) ×1 IMPLANT
TAPE SURGICAL TRANSPORE 1 IN (GAUZE/BANDAGES/DRESSINGS) ×2
VISCOELASTIC ADDITIONAL (OPHTHALMIC RELATED) IMPLANT
WATER STERILE IRR 250ML POUR (IV SOLUTION) ×3 IMPLANT

## 2014-02-16 NOTE — H&P (Signed)
The patient was re examined and there is no change in the patients condition since the original H and P. 

## 2014-02-16 NOTE — Op Note (Signed)
Patient brought to the operating room and prepped and draped in the usual manner.  Lid speculum inserted in left eye.  Stab incision made at the twelve o'clock position.  Provisc instilled in the anterior chamber.   A 2.4 mm. Stab incision was made temporally.  An anterior capsulotomy was done with a bent 25 gauge needle.  The nucleus was hydrodissected.  The Phaco tip was inserted in the anterior chamber and the nucleus was emulsified.  CDE was 13.42.  The cortical material was then removed with the I and A tip.  Posterior capsule was the polished.  The anterior chamber was deepened with Provisc.  A 18.5 Alcon SN60WF IOL was then inserted in the capsular bag.  Provisc was then removed with the I and A tip.  The wound was then hydrated.  Patient sent to the Recovery Room in good condition with follow up in my office.  Preoperative Diagnosis:  Nuclear Cataract OS Postoperative Diagnosis:  Same Procedure name: Kelman Phacoemulsification OS with IOL

## 2014-02-16 NOTE — Anesthesia Preprocedure Evaluation (Signed)
Anesthesia Evaluation  Patient identified by MRN, date of birth, ID band Patient awake    Reviewed: Allergy & Precautions, H&P , NPO status , Patient's Chart, lab work & pertinent test results  Airway Mallampati: II  TM Distance: >3 FB     Dental  (+) Partial Upper, Partial Lower   Pulmonary shortness of breath and with exertion,  breath sounds clear to auscultation        Cardiovascular hypertension, Pt. on medications + dysrhythmias Atrial Fibrillation Rhythm:Regular Rate:Normal     Neuro/Psych    GI/Hepatic negative GI ROS,   Endo/Other  diabetes, Type 2, Oral Hypoglycemic AgentsHypothyroidism Morbid obesity  Renal/GU      Musculoskeletal  (+) Arthritis -,   Abdominal   Peds  Hematology   Anesthesia Other Findings   Reproductive/Obstetrics                             Anesthesia Physical Anesthesia Plan  ASA: III  Anesthesia Plan: MAC   Post-op Pain Management:    Induction: Intravenous  Airway Management Planned: Nasal Cannula  Additional Equipment:   Intra-op Plan:   Post-operative Plan:   Informed Consent: I have reviewed the patients History and Physical, chart, labs and discussed the procedure including the risks, benefits and alternatives for the proposed anesthesia with the patient or authorized representative who has indicated his/her understanding and acceptance.     Plan Discussed with:   Anesthesia Plan Comments:         Anesthesia Quick Evaluation

## 2014-02-16 NOTE — Anesthesia Postprocedure Evaluation (Signed)
  Anesthesia Post-op Note  Patient: [name] [name]  Procedure(s) Performed: Procedure(s) with comments: CATARACT EXTRACTION PHACO AND INTRAOCULAR LENS PLACEMENT  (Left) - CDE:13.42  Patient Location: Short Stay  Anesthesia Type:MAC  Level of Consciousness: awake, alert , oriented and patient cooperative  Airway and Oxygen Therapy: Patient Spontanous Breathing  Post-op Pain: none  Post-op Assessment: Post op VSS;   Post-op Vital Signs: Reviewed and stable  Last Vitals:  Filed Vitals:   02/16/14 0910  BP: 143/70  Pulse: 65  Temp:   Resp: 20    Complications: No apparent anesthesia complications

## 2014-02-16 NOTE — Discharge Instructions (Signed)
[name] [name]  02/16/2014           [facility] Instructions [place] [address]      1. Avoid closing eyes tightly. One often closes the eye tightly when laughing, talking, sneezing, coughing or if they feel irritated. At these times, you should be careful not to close your eyes tightly.  2. Instill eye drops as instructed. To instill drops in your eye, open it, look up and have someone gently pull the lower lid down and instill a couple of drops inside the lower lid.  3. Do not touch upper lid.  4. Take Advil or Tylenol for pain.  5. You may use either eye for near work, such as reading or sewing and you may watch television.  6. You may have your hair done at the beauty parlor at any time.  7. Wear dark glasses with or without your own glasses if you are in bright light.  8. Call our office at [phone] or [phone] if you have sharp pain in your eye or unusual symptoms.  9. Do not be concerned because vision in the operative eye is not good. It will not be good, no matter how successful the operation, until you get a special lens for it. Your old glasses will not be suited to the new eye that was operated on and you will not be ready for a new lens for about a month.  10. Follow up at the [facility] office.    I have received a copy of the above instructions and will follow them.   PATIENT INSTRUCTIONS POST-ANESTHESIA  IMMEDIATELY FOLLOWING SURGERY:  Do not drive or operate machinery for the first twenty four hours after surgery.  Do not make any important decisions for twenty four hours after surgery or while taking narcotic pain medications or sedatives.  If you develop intractable nausea and vomiting or a severe headache please notify your doctor immediately.  FOLLOW-UP:  Please make an appointment with your surgeon as instructed. You do not need to follow up with anesthesia unless specifically instructed to do  so.  WOUND CARE INSTRUCTIONS (if applicable):  Keep a dry clean dressing on the anesthesia/puncture wound site if there is drainage.  Once the wound has quit draining you may leave it open to air.  Generally you should leave the bandage intact for twenty four hours unless there is drainage.  If the epidural site drains for more than 36-48 hours please call the anesthesia department.  QUESTIONS?:  Please feel free to call your physician or the hospital operator if you have any questions, and they will be happy to assist you.

## 2014-02-16 NOTE — Anesthesia Procedure Notes (Signed)
Procedure Name: MAC Date/Time: 02/16/2014 9:15 AM Performed by: Andree Elk, AMY A Pre-anesthesia Checklist: Patient identified, Timeout performed, Suction available, Patient being monitored and Emergency Drugs available Oxygen Delivery Method: Nasal cannula

## 2014-02-16 NOTE — Transfer of Care (Signed)
Immediate Anesthesia Transfer of Care Note  Patient: [name] [name]  Procedure(s) Performed: Procedure(s) with comments: CATARACT EXTRACTION PHACO AND INTRAOCULAR LENS PLACEMENT  (Left) - CDE:13.42  Patient Location: Short Stay  Anesthesia Type:MAC  Level of Consciousness: awake, alert , oriented and patient cooperative  Airway & Oxygen Therapy: Patient Spontanous Breathing  Post-op Assessment: Report given to PACU RN and Post -op Vital signs reviewed and stable  Post vital signs: Reviewed and stable  Complications: No apparent anesthesia complications

## 2014-02-17 ENCOUNTER — Encounter (HOSPITAL_COMMUNITY): Payer: Self-pay | Admitting: Ophthalmology

## 2014-03-30 DIAGNOSIS — Z7901 Long term (current) use of anticoagulants: Secondary | ICD-10-CM | POA: Insufficient documentation

## 2015-03-02 ENCOUNTER — Ambulatory Visit (INDEPENDENT_AMBULATORY_CARE_PROVIDER_SITE_OTHER): Payer: PPO | Admitting: Family Medicine

## 2015-03-02 ENCOUNTER — Encounter: Payer: Self-pay | Admitting: Family Medicine

## 2015-03-02 VITALS — BP 130/74 | HR 56 | Temp 97.1°F | Ht 70.0 in | Wt 255.0 lb

## 2015-03-02 DIAGNOSIS — I480 Paroxysmal atrial fibrillation: Secondary | ICD-10-CM | POA: Diagnosis not present

## 2015-03-02 DIAGNOSIS — R7303 Prediabetes: Secondary | ICD-10-CM

## 2015-03-02 DIAGNOSIS — E785 Hyperlipidemia, unspecified: Secondary | ICD-10-CM

## 2015-03-02 DIAGNOSIS — I10 Essential (primary) hypertension: Secondary | ICD-10-CM

## 2015-03-02 DIAGNOSIS — Z0000 Encounter for general adult medical examination without abnormal findings: Secondary | ICD-10-CM | POA: Diagnosis not present

## 2015-03-02 LAB — POCT INR: INR: 2.1

## 2015-03-02 LAB — POCT GLYCOSYLATED HEMOGLOBIN (HGB A1C): HEMOGLOBIN A1C: 5.6

## 2015-03-02 NOTE — Patient Instructions (Signed)
Great to meet you!  Lets see you again in 4 months  We will call with your results within 1 week  [facility] am glad you are joining [facility], Have fun!

## 2015-03-02 NOTE — Progress Notes (Signed)
   HPI  Patient presents today  stablish care.  Patient a a history of paroxysmal atrial fibrillation on diltiazem, flecainide, and Coumadin.  He feels that he is in good health, he has no complaints. He denies chest pain, dyspnea, palpitations, leg edema.  He has not seen his cardiologist in about 2-3 years.  He is previously seen by Dr. Leonarda Salon and is transitioning care due to insurance preference  Does not exercise regularly but has plans to join a local gym. He watches his diet in general watching portion control and minimizing problem fatty foods  PMH: Smoking status noted Past medical, surgical, social, family history reviewed and updated in EMR ROS: Per HPI  Objective: BP 130/74 mmHg  Pulse 49  Temp(Src) 97.1 F (36.2 C) (Oral)  Ht '5\' 10"'$  (1.778 m)  Wt 255 lb (115.667 kg)  BMI 36.59 kg/m2 Gen: NAD, alert, cooperative with exam HEENT: NCAT, EOMI, PERRL CV: RRR, good S1/S2, no murmur Resp: CTABL, no wheezes, non-labored Abd: SNTND, BS present, no guarding or organomegaly Ext: No edema, warm Neuro: Alert and oriented, No gross deficits  Assessment and plan:  # Hypertension Well-controlled on lisinopril, continue Labs, fasting  # Paroxysmal A. fib Continue Coumadin, diltiazem, flecanide Slightly brady today byut asymptomatic, regular rhythm Encouraged cardiology follow-up INR theraputic- 2.1  # pre-diabetes A1C, labs  # HLD Labs Continue lipitor 20  # Hypothyroidism TSH, Continue current dose of   # HCM PSA   Orders Placed This Encounter  Procedures  . Lipid panel  . CMP14+EGFR  . CBC with Differential  . TSH  . PSA  . POCT glycosylated hemoglobin (Hb A1C)  . POCT INR    Laroy Apple, MD [facility] 03/02/2015, 9:53 AM

## 2015-03-03 LAB — CMP14+EGFR
ALT: 17 IU/L (ref 0–44)
AST: 15 IU/L (ref 0–40)
Albumin/Globulin Ratio: 1.7 (ref 1.1–2.5)
Albumin: 4.3 g/dL (ref 3.5–4.8)
Alkaline Phosphatase: 58 IU/L (ref 39–117)
BILIRUBIN TOTAL: 0.3 mg/dL (ref 0.0–1.2)
BUN / CREAT RATIO: 15 (ref 10–22)
BUN: 13 mg/dL (ref 8–27)
CHLORIDE: 101 mmol/L (ref 96–106)
CO2: 22 mmol/L (ref 18–29)
Calcium: 9.1 mg/dL (ref 8.6–10.2)
Creatinine, Ser: 0.88 mg/dL (ref 0.76–1.27)
GFR calc non Af Amer: 86 mL/min/{1.73_m2} (ref >59)
GFR, EST AFRICAN AMERICAN: 99 mL/min/{1.73_m2} (ref >59)
Globulin, Total: 2.6 g/dL (ref 1.5–4.5)
Glucose: 94 mg/dL (ref 65–99)
Potassium: 4.8 mmol/L (ref 3.5–5.2)
Sodium: 139 mmol/L (ref 134–144)
TOTAL PROTEIN: 6.9 g/dL (ref 6.0–8.5)

## 2015-03-03 LAB — CBC WITH DIFFERENTIAL/PLATELET
BASOS ABS: 0.1 10*3/uL (ref 0.0–0.2)
Basos: 1 %
EOS (ABSOLUTE): 0.2 10*3/uL (ref 0.0–0.4)
EOS: 3 %
HEMATOCRIT: 45.2 % (ref 37.5–51.0)
Hemoglobin: 15 g/dL (ref 12.6–17.7)
Immature Grans (Abs): 0 10*3/uL (ref 0.0–0.1)
Immature Granulocytes: 0 %
LYMPHS ABS: 2.5 10*3/uL (ref 0.7–3.1)
Lymphs: 32 %
MCH: 30.4 pg (ref 26.6–33.0)
MCHC: 33.2 g/dL (ref 31.5–35.7)
MCV: 92 fL (ref 79–97)
MONOS ABS: 1 10*3/uL — AB (ref 0.1–0.9)
Monocytes: 13 %
Neutrophils Absolute: 4.1 10*3/uL (ref 1.4–7.0)
Neutrophils: 51 %
PLATELETS: 234 10*3/uL (ref 150–379)
RBC: 4.94 x10E6/uL (ref 4.14–5.80)
RDW: 13.4 % (ref 12.3–15.4)
WBC: 7.9 10*3/uL (ref 3.4–10.8)

## 2015-03-03 LAB — LIPID PANEL
CHOL/HDL RATIO: 3 ratio (ref 0.0–5.0)
Cholesterol, Total: 126 mg/dL (ref 100–199)
HDL: 42 mg/dL (ref >39)
LDL CALC: 63 mg/dL (ref 0–99)
Triglycerides: 105 mg/dL (ref 0–149)
VLDL Cholesterol Cal: 21 mg/dL (ref 5–40)

## 2015-03-03 LAB — PSA: PROSTATE SPECIFIC AG, SERUM: 0.4 ng/mL (ref 0.0–4.0)

## 2015-03-03 LAB — TSH: TSH: 2.37 u[IU]/mL (ref 0.450–4.500)

## 2015-03-10 DIAGNOSIS — L820 Inflamed seborrheic keratosis: Secondary | ICD-10-CM | POA: Diagnosis not present

## 2015-03-10 DIAGNOSIS — Z08 Encounter for follow-up examination after completed treatment for malignant neoplasm: Secondary | ICD-10-CM | POA: Diagnosis not present

## 2015-03-10 DIAGNOSIS — Z85828 Personal history of other malignant neoplasm of skin: Secondary | ICD-10-CM | POA: Diagnosis not present

## 2015-03-10 DIAGNOSIS — C44519 Basal cell carcinoma of skin of other part of trunk: Secondary | ICD-10-CM | POA: Diagnosis not present

## 2015-03-10 DIAGNOSIS — Z1283 Encounter for screening for malignant neoplasm of skin: Secondary | ICD-10-CM | POA: Diagnosis not present

## 2015-03-16 ENCOUNTER — Telehealth: Payer: Self-pay | Admitting: Family Medicine

## 2015-03-16 MED ORDER — METFORMIN HCL 500 MG PO TABS: 500.0000 mg | ORAL_TABLET | Freq: Every day | ORAL | Status: DC

## 2015-03-16 MED ORDER — DILTIAZEM HCL ER BEADS 360 MG PO CP24: 360.0000 mg | ORAL_CAPSULE | Freq: Every day | ORAL | Status: DC

## 2015-03-16 MED ORDER — WARFARIN SODIUM 5 MG PO TABS: 2.5000 mg | ORAL_TABLET | ORAL | Status: DC

## 2015-03-16 MED ORDER — LISINOPRIL 20 MG PO TABS: 20.0000 mg | ORAL_TABLET | Freq: Every day | ORAL | Status: DC

## 2015-03-16 MED ORDER — LEVOTHYROXINE SODIUM 100 MCG PO TABS: 100.0000 ug | ORAL_TABLET | Freq: Every day | ORAL | Status: DC

## 2015-03-16 MED ORDER — FLECAINIDE ACETATE 100 MG PO TABS: ORAL_TABLET | ORAL | Status: DC

## 2015-03-16 MED ORDER — ATORVASTATIN CALCIUM 10 MG PO TABS: 10.0000 mg | ORAL_TABLET | Freq: Every day | ORAL | Status: DC

## 2015-03-16 NOTE — Telephone Encounter (Signed)
done

## 2015-03-17 ENCOUNTER — Other Ambulatory Visit: Payer: Self-pay | Admitting: *Deleted

## 2015-03-17 MED ORDER — WARFARIN SODIUM 5 MG PO TABS: ORAL_TABLET | ORAL | Status: DC

## 2015-03-23 ENCOUNTER — Telehealth: Payer: Self-pay | Admitting: Family Medicine

## 2015-03-24 MED ORDER — METFORMIN HCL ER 500 MG PO TB24: 500.0000 mg | ORAL_TABLET | Freq: Every day | ORAL | Status: DC

## 2015-03-24 NOTE — Telephone Encounter (Signed)
I have sent it, will ask nursing to notify.   Laroy Apple, MD [facility] 03/24/2015, 7:38 AM

## 2015-03-24 NOTE — Telephone Encounter (Signed)
Pt aware.

## 2015-03-30 ENCOUNTER — Ambulatory Visit (INDEPENDENT_AMBULATORY_CARE_PROVIDER_SITE_OTHER): Payer: PPO | Admitting: Pharmacist

## 2015-03-30 DIAGNOSIS — I480 Paroxysmal atrial fibrillation: Secondary | ICD-10-CM

## 2015-03-30 LAB — POCT INR: INR: 2.2

## 2015-03-30 NOTE — Patient Instructions (Signed)
Anticoagulation Dose Instructions as of 03/30/2015      [name] Tue Wed Thu Fri Sat   New Dose 5 mg 5 mg 2.5 mg 5 mg 0 mg 5 mg 5 mg    Description        Continue current warfarin dose of 1/2 tablet on tuesdays, none on thursdays and 1 tablet all other days     INR was 2.2 today

## 2015-04-29 ENCOUNTER — Encounter: Payer: Self-pay | Admitting: Pharmacist

## 2015-04-29 ENCOUNTER — Ambulatory Visit (INDEPENDENT_AMBULATORY_CARE_PROVIDER_SITE_OTHER): Payer: PPO | Admitting: Pharmacist

## 2015-04-29 VITALS — BP 116/68 | HR 59 | Ht 68.0 in | Wt 259.0 lb

## 2015-04-29 DIAGNOSIS — I480 Paroxysmal atrial fibrillation: Secondary | ICD-10-CM

## 2015-04-29 DIAGNOSIS — Z0000 Encounter for general adult medical examination without abnormal findings: Secondary | ICD-10-CM

## 2015-04-29 DIAGNOSIS — Z1211 Encounter for screening for malignant neoplasm of colon: Secondary | ICD-10-CM

## 2015-04-29 LAB — POCT INR: INR: 1.7

## 2015-04-29 NOTE — Progress Notes (Signed)
Patient ID: [name] [name], male   DOB: [birth year], 75 y.o.   MRN: [id number]   Subjective:   [name] [name] is a 75 y.o. male who presents for an Initial Medicare Annual Wellness Visit and recheck INR.  [name] is married and lives in [place], [place] with His wife.  She has recently has knee surgery.  [name] is in NAD today, very pleasant and cooperative.   Review of Systems  Review of Systems  Constitutional: Negative.   HENT: Negative.   Eyes: Negative.   Respiratory: Negative.   Cardiovascular: Negative.   Gastrointestinal: Negative.   Genitourinary: Negative.   Musculoskeletal: Negative.   Skin: Negative.   Neurological: Negative.   Endo/Heme/Allergies: Bruises/bleeds easily (takes warfarin).  Psychiatric/Behavioral: Negative.       Current Medications (verified) Outpatient Encounter Prescriptions as of 04/29/2015  Medication Sig  . atorvastatin (LIPITOR) 10 MG tablet Take 1 tablet (10 mg total) by mouth daily.  Marland Kitchen diltiazem (TIAZAC) 360 MG 24 hr capsule Take 1 capsule (360 mg total) by mouth daily.  . flecainide (TAMBOCOR) 100 MG tablet TAKE 1 TABLET (100 MG TOTAL) BY MOUTH 2 (TWO) TIMES DAILY.  Marland Kitchen levothyroxine (SYNTHROID, LEVOTHROID) 100 MCG tablet Take 1 tablet (100 mcg total) by mouth daily.  Marland Kitchen lisinopril (PRINIVIL,ZESTRIL) 20 MG tablet Take 1 tablet (20 mg total) by mouth daily.  . metFORMIN (GLUCOPHAGE XR) 500 MG 24 hr tablet Take 1 tablet (500 mg total) by mouth daily with breakfast.  . warfarin (COUMADIN) 5 MG tablet Take 0.5 to 1 tablet po qd per anticoagulation clinic   No facility-administered encounter medications on file as of 04/29/2015.    Allergies (verified) Review of patient's allergies indicates no known allergies.   History: Past Medical History  Diagnosis Date  . HYPERTENSION   . Atrial fibrillation ([place])   . ROTATOR CUFF TEAR   . HYPOTHYROIDISM   . Arthritis   . Hyperlipidemia   . Cataract    Past Surgical History  Procedure Laterality Date  .  Appendectomy    . Rotator cuff repair Left   . Knee surgery    . Shoulder surgery    . Total knee arthroplasty Bilateral   . Cataract extraction w/phaco Right 02/02/2014    Procedure: CATARACT EXTRACTION PHACO AND INTRAOCULAR LENS PLACEMENT (IOC);  Surgeon: Elta Guadeloupe T. Gershon Crane, MD;  Location: [facility];  Service: Ophthalmology;  Laterality: Right;  CDE 12.17  . Cataract extraction w/phaco Left 02/16/2014    Procedure: CATARACT EXTRACTION PHACO AND INTRAOCULAR LENS PLACEMENT ;  Surgeon: Elta Guadeloupe T. Gershon Crane, MD;  Location: [facility];  Service: Ophthalmology;  Laterality: Left;  CDE:13.42  . Eye surgery  2015    cataract surgery. bilateral  . Joint replacement  2009    bilateral knee replacement   Family History  Problem Relation Age of Onset  . Heart attack Brother   . Early death Brother   . Asthma Father   . Vision loss Father   . Hypertension Mother   . Heart attack Brother    Social History   Occupational History  . Not on file.   Social History Main Topics  . Smoking status: Never Smoker   . Smokeless tobacco: Never Used  . Alcohol Use: No  . Drug Use: No  . Sexual Activity: No    Do you feel safe at home?  Yes  Dietary issues and exercise activities discussed: Current Exercise Habits:: Home exercise routine;Structured exercise class, Type of exercise: walking, Time (Minutes):  15, Frequency (Times/Week): 2, Weekly Exercise (Minutes/Week): 30, Intensity: Mild  Current Dietary habits:  He and his wife were following low calorie diet and he lost about 30lbs over the last 2 years however because of the complications with his wife's knee surgery they have not been following diet recently.  He is hoping to restart diet when she is better.    Cardiac Risk Factors include: advanced age (>67men, >1 women);dyslipidemia;hypertension;male gender;obesity (BMI >30kg/m2)  Objective:    Today's Vitals   04/29/15 0755  BP: 116/68  Pulse: 59  Height: 5\' 8"  (1.727 m)  Weight: 259 lb (117.482  kg)  PainSc: 0-No pain   Body mass index is 39.39 kg/(m^2).   INR was 1.7 today   Activities of Daily Living In your present state of health, do you have any difficulty performing the following activities: 04/29/2015  Hearing? N  Vision? N  Difficulty concentrating or making decisions? N  Walking or climbing stairs? N  Dressing or bathing? N  Doing errands, shopping? N  Preparing Food and eating ? N  Using the Toilet? N  In the past six months, have you accidently leaked urine? N  Do you have problems with loss of bowel control? N  Managing your Medications? N  Managing your Finances? N  Housekeeping or managing your Housekeeping? N    Are there smokers in your home (other than you)? No    Depression Screen PHQ 2/9 Scores 04/29/2015 03/02/2015  PHQ - 2 Score 0 0    Fall Risk Fall Risk  04/29/2015 03/02/2015  Falls in the past year? No No    Cognitive Function: MMSE - Mini Mental State Exam 04/29/2015  Orientation to time 5  Orientation to Place 5  Registration 3  Attention/ Calculation 3  Recall 3  Language- name 2 objects 2  Language- repeat 1  Language- follow 3 step command 3  Language- read & follow direction 1  Write a sentence 1  Copy design 1  Total score 28    Immunizations and Health Maintenance Immunization History  Administered Date(s) Administered  . Influenza-Unspecified 11/28/2014   Health Maintenance Due  Topic Date Due  . TETANUS/TDAP  12/05/1961  . COLONOSCOPY  12/05/1992  . ZOSTAVAX  12/06/2002  . PNA vac Low Risk Adult (1 of 2 - PCV13) 12/06/2007    Patient Care Team: Timmothy Euler, MD as PCP - General (Family Medicine) Rutherford Guys, MD as Consulting Physician (Ophthalmology) Lelon Perla, MD as Consulting Physician (Cardiology) Rogene Houston, MD as Consulting Physician (Gastroenterology) Gaynelle Arabian, MD as Consulting Physician (Orthopedic Surgery) Celene Squibb, MD as Consulting Physician (Dermatology)  Indicate any recent  Medical Services you may have received from other than [facility] providers in the past year (date may be approximate).    Assessment:    Annual Wellness Visit  Subtherapeutic anticoagulation   Screening Tests Health Maintenance  Topic Date Due  . TETANUS/TDAP  12/05/1961  . COLONOSCOPY  12/05/1992  . ZOSTAVAX  12/06/2002  . PNA vac Low Risk Adult (1 of 2 - PCV13) 12/06/2007  . INFLUENZA VACCINE  09/27/2015        Plan:   During the course of the visit [place] was educated and counseled about the following appropriate screening and preventive services:   Vaccines to include Pneumoccal, Influenza, Hepatitis B, Td, Zostavax - will review records from past PCP to verify Pneumo vaccines;  UTD on influenza vaccines.  We were unable to verify cost of Tdap  and Zostavax but since patient is not sure insurance will cover he will wait.  Colorectal cancer screening - colonoscopy is UTD / he is due FOBT and given in office today  Cardiovascular disease screening - UTD  Diabetes screening - patient has pre diabetes which is controlled;  Last A1c was 5.6%. Continue with metformin  Glaucoma screening /  Eye Exam - patient has appt with Dr Gershon Crane next month  Nutrition counseling - discussed limiting corn, potatoe, rice and pasta servings to 1/2 cup.  Recommended increase non starchy vegetables, whole grains and lean proteins.  Prostate cancer screening - UTD  Exercise - continue to work out at home and [facility] - goal is 150 mintues per week  Anticoagulation Dose Instructions as of 04/29/2015      [name] Tue Wed Thu Fri Sat   New Dose 5 mg 2.5 mg 5 mg 2.5 mg 5 mg 2.5 mg 5 mg    Description        Change warfarin 5mg  to 1/2 tablet mondays, wednesdays and fridays and 1 tablet all other days.     Recheck INR in 4 weeks.    Patient Instructions (the written plan) were given to the patient.   Cherre Robins, [facility]   04/29/2015

## 2015-04-29 NOTE — Patient Instructions (Addendum)
Anticoagulation Dose Instructions as of 04/29/2015      [name] [name] Tue Wed Thu Fri Sat   New Dose 5 mg 2.5 mg 5 mg 2.5 mg 5 mg 2.5 mg 5 mg    Description        Change warfarin 5mg  to 1/2 tablet mondays, wednesdays and fridays and 1 tablet all other days.     INR was 1.7 today    [name] [name] , Thank you for taking time to come for your Medicare Wellness Visit. I appreciate your ongoing commitment to your health goals. Please review the following plan we discussed and let me know if I can assist you in the future.   These are the goals we discussed:   Increase non-starchy vegetables - carrots, green bean, squash, zucchini, tomatoes, onions, peppers, spinach and other green leafy vegetables, cabbage, lettuce, cucumbers, asparagus, okra (not fried), eggplant limit sugar and processed foods (cakes, cookies, ice cream, crackers and chips) Increase fresh fruit but limit serving sizes 1/2 cup or about the size of tennis or baseball limit red meat to no more than 1-2 times per week (serving size about the size of your palm) Choose whole grains / lean proteins - whole wheat bread, quinoa, whole grain rice (1/2 cup), fish, chicken, [place]  Restart exercise at [facility] when able - great job!   This is a list of the screening recommended for you and due dates:  Health Maintenance  Topic Date Due  . Tetanus Vaccine  12/05/1961  . Colon Cancer Screening  12/05/1992  . Shingles Vaccine  12/06/2002  . Pneumonia vaccines (1 of 2 - PCV13) 12/06/2007  . Flu Shot  09/27/2015   Health Maintenance, Male A healthy lifestyle and preventative care can promote health and wellness.  Maintain regular health, dental, and eye exams.  Eat a healthy diet. Foods like vegetables, fruits, whole grains, low-fat dairy products, and lean protein foods contain the nutrients you need and are low in calories. Decrease your intake of foods high in solid fats, added sugars, and salt. Get information about a proper diet from your  health care provider, if necessary.  Regular physical exercise is one of the most important things you can do for your health. Most adults should get at least 150 minutes of moderate-intensity exercise (any activity that increases your heart rate and causes you to sweat) each week. In addition, most adults need muscle-strengthening exercises on 2 or more days a week.   Maintain a healthy weight. The body mass index (BMI) is a screening tool to identify possible weight problems. It provides an estimate of body fat based on height and weight. Your health care provider can find your BMI and can help you achieve or maintain a healthy weight. For males 20 years and older:  A BMI below 18.5 is considered underweight.  A BMI of 18.5 to 24.9 is normal.  A BMI of 25 to 29.9 is considered overweight.  A BMI of 30 and above is considered obese.  Maintain normal blood lipids and cholesterol by exercising and minimizing your intake of saturated fat. Eat a balanced diet with plenty of fruits and vegetables. Blood tests for lipids and cholesterol should begin at age 69 and be repeated every 5 years. If your lipid or cholesterol levels are high, you are over age 7, or you are at high risk for heart disease, you may need your cholesterol levels checked more frequently.Ongoing high lipid and cholesterol levels should be treated with medicines if  diet and exercise are not working.  If you smoke, find out from your health care provider how to quit. If you do not use tobacco, do not start.  Lung cancer screening is recommended for adults aged 32-80 years who are at high risk for developing lung cancer because of a history of smoking. A yearly low-dose CT scan of the lungs is recommended for people who have at least a 30-pack-year history of smoking and are current smokers or have quit within the past 15 years. A pack year of smoking is smoking an average of 1 pack of cigarettes a day for 1 year (for example, a  30-pack-year history of smoking could mean smoking 1 pack a day for 30 years or 2 packs a day for 15 years). Yearly screening should continue until the smoker has stopped smoking for at least 15 years. Yearly screening should be stopped for people who develop a health problem that would prevent them from having lung cancer treatment.  If you choose to drink alcohol, do not have more than 2 drinks per day. One drink is considered to be 12 oz (360 mL) of beer, 5 oz (150 mL) of wine, or 1.5 oz (45 mL) of liquor.  Avoid the use of street drugs. Do not share needles with anyone. Ask for help if you need support or instructions about stopping the use of drugs.  High blood pressure causes heart disease and increases the risk of stroke. High blood pressure is more likely to develop in:  People who have blood pressure in the end of the normal range (100-139/85-89 mm Hg).  People who are overweight or obese.  People who are African American.  If you are 35-36 years of age, have your blood pressure checked every 3-5 years. If you are 62 years of age or older, have your blood pressure checked every year. You should have your blood pressure measured twice--once when you are at a hospital or clinic, and once when you are not at a hospital or clinic. Record the average of the two measurements. To check your blood pressure when you are not at a hospital or clinic, you can use:  An automated blood pressure machine at a pharmacy.  A home blood pressure monitor.  If you are 58-9 years old, ask your health care provider if you should take aspirin to prevent heart disease.  Diabetes screening involves taking a blood sample to check your fasting blood sugar level. This should be done once every 3 years after age 22 if you are at a normal weight and without risk factors for diabetes. Testing should be considered at a younger age or be carried out more frequently if you are overweight and have at least 1 risk factor  for diabetes.  Colorectal cancer can be detected and often prevented. Most routine colorectal cancer screening begins at the age of 1 and continues through age 63. However, your health care provider may recommend screening at an earlier age if you have risk factors for colon cancer. On a yearly basis, your health care provider may provide home test kits to check for hidden blood in the stool. A small camera at the end of a tube may be used to directly examine the colon (sigmoidoscopy or colonoscopy) to detect the earliest forms of colorectal cancer. Talk to your health care provider about this at age 55 when routine screening begins. A direct exam of the colon should be repeated every 5-10 years through age 3, unless  early forms of precancerous polyps or small growths are found.  People who are at an increased risk for hepatitis B should be screened for this virus. You are considered at high risk for hepatitis B if:  You were born in a country where hepatitis B occurs often. Talk with your health care provider about which countries are considered high risk.  Your parents were born in a high-risk country and you have not received a shot to protect against hepatitis B (hepatitis B vaccine).  You have HIV or AIDS.  You use needles to inject street drugs.  You live with, or have sex with, someone who has hepatitis B.  You are a man who has sex with other men (MSM).  You get hemodialysis treatment.  You take certain medicines for conditions like cancer, organ transplantation, and autoimmune conditions.  Hepatitis C blood testing is recommended for all people born from [birth year] through 1963 and any individual with known risk factors for hepatitis C.  Healthy men should no longer receive prostate-specific antigen (PSA) blood tests as part of routine cancer screening. Talk to your health care provider about prostate cancer screening.  Testicular cancer screening is not recommended for adolescents or  adult males who have no symptoms. Screening includes self-exam, a health care provider exam, and other screening tests. Consult with your health care provider about any symptoms you have or any concerns you have about testicular cancer.  Practice safe sex. Use condoms and avoid high-risk sexual practices to reduce the spread of sexually transmitted infections (STIs).  You should be screened for STIs, including gonorrhea and chlamydia if:  You are sexually active and are younger than 24 years.  You are older than 24 years, and your health care provider tells you that you are at risk for this type of infection.  Your sexual activity has changed since you were last screened, and you are at an increased risk for chlamydia or gonorrhea. Ask your health care provider if you are at risk.  If you are at risk of being infected with HIV, it is recommended that you take a prescription medicine daily to prevent HIV infection. This is called pre-exposure prophylaxis (PrEP). You are considered at risk if:  You are a man who has sex with other men (MSM).  You are a heterosexual man who is sexually active with multiple partners.  You take drugs by injection.  You are sexually active with a partner who has HIV.  Talk with your health care provider about whether you are at high risk of being infected with HIV. If you choose to begin PrEP, you should first be tested for HIV. You should then be tested every 3 months for as long as you are taking PrEP.  Use sunscreen. Apply sunscreen liberally and repeatedly throughout the day. You should seek shade when your shadow is shorter than you. Protect yourself by wearing long sleeves, pants, a wide-brimmed hat, and sunglasses year round whenever you are outdoors.  Tell your health care provider of new moles or changes in moles, especially if there is a change in shape or color. Also, tell your health care provider if a mole is larger than the size of a pencil  eraser.  A one-time screening for abdominal aortic aneurysm (AAA) and surgical repair of large AAAs by ultrasound is recommended for men aged 23-75 years who are current or former smokers.  Stay current with your vaccines (immunizations).   This information is not intended to replace advice  given to you by your health care provider. Make sure you discuss any questions you have with your health care provider.   Document Released: 08/11/2007 Document Revised: 03/05/2014 Document Reviewed: 07/10/2010 Elsevier Interactive Patient Education [employer].

## 2015-05-09 DIAGNOSIS — Z08 Encounter for follow-up examination after completed treatment for malignant neoplasm: Secondary | ICD-10-CM | POA: Diagnosis not present

## 2015-05-09 DIAGNOSIS — Z85828 Personal history of other malignant neoplasm of skin: Secondary | ICD-10-CM | POA: Diagnosis not present

## 2015-05-26 ENCOUNTER — Encounter: Payer: Self-pay | Admitting: Family

## 2015-05-26 ENCOUNTER — Ambulatory Visit (INDEPENDENT_AMBULATORY_CARE_PROVIDER_SITE_OTHER): Payer: PPO | Admitting: Family

## 2015-05-26 ENCOUNTER — Ambulatory Visit (INDEPENDENT_AMBULATORY_CARE_PROVIDER_SITE_OTHER): Payer: PPO | Admitting: Pharmacist

## 2015-05-26 VITALS — BP 115/62 | HR 59 | Temp 97.5°F | Ht 68.0 in | Wt 252.0 lb

## 2015-05-26 DIAGNOSIS — I480 Paroxysmal atrial fibrillation: Secondary | ICD-10-CM | POA: Diagnosis not present

## 2015-05-26 DIAGNOSIS — J209 Acute bronchitis, unspecified: Secondary | ICD-10-CM | POA: Diagnosis not present

## 2015-05-26 DIAGNOSIS — Z20828 Contact with and (suspected) exposure to other viral communicable diseases: Secondary | ICD-10-CM | POA: Diagnosis not present

## 2015-05-26 LAB — COAGUCHEK XS/INR WAIVED
INR: 2.5 — ABNORMAL HIGH (ref 0.9–1.1)
PROTHROMBIN TIME: 29.6 s

## 2015-05-26 MED ORDER — HYDROCODONE-HOMATROPINE 5-1.5 MG/5ML PO SYRP: 5.0000 mL | ORAL_SOLUTION | Freq: Three times a day (TID) | ORAL | Status: DC | PRN

## 2015-05-26 MED ORDER — AZITHROMYCIN 250 MG PO TABS: ORAL_TABLET | ORAL | Status: DC

## 2015-05-26 MED ORDER — BENZONATATE 200 MG PO CAPS: 200.0000 mg | ORAL_CAPSULE | Freq: Three times a day (TID) | ORAL | Status: DC | PRN

## 2015-05-26 MED ORDER — PREDNISONE 10 MG (21) PO TBPK: 10.0000 mg | ORAL_TABLET | Freq: Every day | ORAL | Status: DC

## 2015-05-26 MED ORDER — ALBUTEROL SULFATE HFA 108 (90 BASE) MCG/ACT IN AERS: 2.0000 | INHALATION_SPRAY | Freq: Four times a day (QID) | RESPIRATORY_TRACT | Status: DC | PRN

## 2015-05-26 NOTE — Progress Notes (Signed)
Subjective:    Patient ID: [name] [name], male    DOB: [birth year], 75 y.o.   MRN: [id number]  Cough This is a new problem. The current episode started in the past 7 days. The problem has been waxing and waning. The problem occurs every few minutes. The cough is productive of sputum and productive of brown sputum. Associated symptoms include headaches, myalgias, nasal congestion, rhinorrhea, a sore throat and wheezing. Pertinent negatives include no chills, ear congestion, ear pain or fever. The symptoms are aggravated by lying down. He has tried OTC cough suppressant and rest for the symptoms. The treatment provided mild relief. There is no history of asthma or COPD.      Review of Systems  Constitutional: Negative.  Negative for fever and chills.  HENT: Positive for rhinorrhea and sore throat. Negative for ear pain.   Respiratory: Positive for cough and wheezing.   Cardiovascular: Negative.   Gastrointestinal: Negative.   Endocrine: Negative.   Genitourinary: Negative.   Musculoskeletal: Positive for myalgias.  Neurological: Positive for headaches.  Hematological: Negative.   Psychiatric/Behavioral: Negative.   All other systems reviewed and are negative.      Objective:   Physical Exam  Constitutional: He is oriented to person, place, and time. He appears well-developed and well-nourished. No distress.  HENT:  Head: Normocephalic.  Right Ear: External ear normal.  Left Ear: External ear normal.  Mouth/Throat: Oropharynx is clear and moist.  Eyes: Pupils are equal, round, and reactive to light. Right eye exhibits no discharge. Left eye exhibits no discharge.  Neck: Normal range of motion. Neck supple. No thyromegaly present.  Cardiovascular: Normal rate, regular rhythm, normal heart sounds and intact distal pulses.   No murmur heard. Pulmonary/Chest: Effort normal. No respiratory distress. He has wheezes.  Coarse cough   Abdominal: Soft. Bowel sounds are normal. He  exhibits no distension. There is no tenderness.  Musculoskeletal: Normal range of motion. He exhibits no edema or tenderness.  Neurological: He is alert and oriented to person, place, and time. He has normal reflexes. No cranial nerve deficit.  Skin: Skin is warm and dry. No rash noted. No erythema.  Psychiatric: He has a normal mood and affect. His behavior is normal. Judgment and thought content normal.  Vitals reviewed.     BP 115/62 mmHg  Pulse 59  Temp(Src) 97.5 F (36.4 C) (Oral)  Ht 5\' 8"  (1.727 m)  Wt 252 lb (114.306 kg)  BMI 38.33 kg/m2     Assessment & Plan:  1. Acute bronchitis, unspecified organism -- Take meds as prescribed - Use a cool mist humidifier  -Use saline nose sprays frequently -Saline irrigations of the nose can be very helpful if done frequently.  * 4X daily for 1 week*  * Use of a nettie pot can be helpful with this. Follow directions with this* -Force fluids -For any cough or congestion  Use plain Mucinex- regular strength or max strength is fine   * Children- consult with Pharmacist for dosing -For fever or aces or pains- take tylenol or ibuprofen appropriate for age and weight.  * for fevers greater than 101 orally you may alternate ibuprofen and tylenol every  3 hours. -Throat lozenges if help -New toothbrush in 3 days - azithromycin (ZITHROMAX Z-PAK) 250 MG tablet; As directed  Dispense: 1 each; Refill: 0 - predniSONE (STERAPRED UNI-PAK 21 TAB) 10 MG (21) TBPK tablet; Take 1 tablet (10 mg total) by mouth daily. Use as directed  Dispense: 21 tablet;  Refill: 0 - albuterol (PROVENTIL HFA;VENTOLIN HFA) 108 (90 Base) MCG/ACT inhaler; Inhale 2 puffs into the lungs every 6 (six) hours as needed for wheezing or shortness of breath.  Dispense: 1 Inhaler; Refill: 2  2. Exposure to influenza - azithromycin (ZITHROMAX Z-PAK) 250 MG tablet; As directed  Dispense: 1 each; Refill: 0 - predniSONE (STERAPRED UNI-PAK 21 TAB) 10 MG (21) TBPK tablet; Take 1 tablet  (10 mg total) by mouth daily. Use as directed  Dispense: 21 tablet; Refill: 0 - albuterol (PROVENTIL HFA;VENTOLIN HFA) 108 (90 Base) MCG/ACT inhaler; Inhale 2 puffs into the lungs every 6 (six) hours as needed for wheezing or shortness of breath.  Dispense: 1 Inhaler; Refill: [place], FNP

## 2015-05-26 NOTE — Patient Instructions (Signed)
Anticoagulation Dose Instructions as of 05/26/2015      [name] Tue Wed Thu Fri Sat   New Dose 5 mg 2.5 mg 5 mg 2.5 mg 5 mg 2.5 mg 5 mg    Description        Continue warfarin 5mg  to 1/2 tablet mondays, wednesdays and fridays and 1 tablet all other days.     INR was 2.5 today

## 2015-05-26 NOTE — Patient Instructions (Addendum)
Acute Bronchitis Bronchitis is inflammation of the airways that extend from the windpipe into the lungs (bronchi). The inflammation often causes mucus to develop. This leads to a cough, which is the most common symptom of bronchitis.  In acute bronchitis, the condition usually develops suddenly and goes away over time, usually in a couple weeks. Smoking, allergies, and asthma can make bronchitis worse. Repeated episodes of bronchitis may cause further lung problems.  CAUSES Acute bronchitis is most often caused by the same virus that causes a cold. The virus can spread from person to person (contagious) through coughing, sneezing, and touching contaminated objects. SIGNS AND SYMPTOMS   Cough.   Fever.   Coughing up mucus.   Body aches.   Chest congestion.   Chills.   Shortness of breath.   Sore throat.  DIAGNOSIS  Acute bronchitis is usually diagnosed through a physical exam. Your health care provider will also ask you questions about your medical history. Tests, such as chest X-rays, are sometimes done to rule out other conditions.  TREATMENT  Acute bronchitis usually goes away in a couple weeks. Oftentimes, no medical treatment is necessary. Medicines are sometimes given for relief of fever or cough. Antibiotic medicines are usually not needed but may be prescribed in certain situations. In some cases, an inhaler may be recommended to help reduce shortness of breath and control the cough. A cool mist vaporizer may also be used to help thin bronchial secretions and make it easier to clear the chest.  HOME CARE INSTRUCTIONS  Get plenty of rest.   Drink enough fluids to keep your urine clear or pale yellow (unless you have a medical condition that requires fluid restriction). Increasing fluids may help thin your respiratory secretions (sputum) and reduce chest congestion, and it will prevent dehydration.   Take medicines only as directed by your health care provider.  If  you were prescribed an antibiotic medicine, finish it all even if you start to feel better.  Avoid smoking and secondhand smoke. Exposure to cigarette smoke or irritating chemicals will make bronchitis worse. If you are a smoker, consider using nicotine gum or skin patches to help control withdrawal symptoms. Quitting smoking will help your lungs heal faster.   Reduce the chances of another bout of acute bronchitis by washing your hands frequently, avoiding people with cold symptoms, and trying not to touch your hands to your mouth, nose, or eyes.   Keep all follow-up visits as directed by your health care provider.  SEEK MEDICAL CARE IF: Your symptoms do not improve after 1 week of treatment.  SEEK IMMEDIATE MEDICAL CARE IF:  You develop an increased fever or chills.   You have chest pain.   You have severe shortness of breath.  You have bloody sputum.   You develop dehydration.  You faint or repeatedly feel like you are going to pass out.  You develop repeated vomiting.  You develop a severe headache. MAKE SURE YOU:   Understand these instructions.  Will watch your condition.  Will get help right away if you are not doing well or get worse.   This information is not intended to replace advice given to you by your health care provider. Make sure you discuss any questions you have with your health care provider.   Document Released: 03/22/2004 Document Revised: 03/05/2014 Document Reviewed: 08/05/2012 Elsevier Interactive Patient Education 2016 [employer]  - Take meds as prescribed - Use a cool mist humidifier  -Use saline nose sprays frequently -Saline   irrigations of the nose can be very helpful if done frequently.  * 4X daily for 1 week*  * Use of a nettie pot can be helpful with this. Follow directions with this* -Force fluids -For any cough or congestion  Use plain Mucinex- regular strength or max strength is fine   * Children- consult with Pharmacist for  dosing -For fever or aces or pains- take tylenol or ibuprofen appropriate for age and weight.  * for fevers greater than 101 orally you may alternate ibuprofen and tylenol every  3 hours. -Throat lozenges if help -New toothbrush in 3 days   Kanika Bungert, FNP   

## 2015-05-26 NOTE — Progress Notes (Signed)
Patient triaged to [name] Dun, NP for congestion, cough, diarrhea.

## 2015-05-27 ENCOUNTER — Encounter: Payer: Self-pay | Admitting: Pharmacist

## 2015-06-09 ENCOUNTER — Encounter: Payer: Self-pay | Admitting: Family

## 2015-06-09 ENCOUNTER — Ambulatory Visit (INDEPENDENT_AMBULATORY_CARE_PROVIDER_SITE_OTHER): Payer: PPO | Admitting: Family

## 2015-06-09 VITALS — BP 135/78 | HR 55 | Temp 98.3°F | Ht 68.0 in | Wt 257.0 lb

## 2015-06-09 DIAGNOSIS — J209 Acute bronchitis, unspecified: Secondary | ICD-10-CM | POA: Diagnosis not present

## 2015-06-09 NOTE — Patient Instructions (Signed)

## 2015-06-09 NOTE — Progress Notes (Signed)
   Subjective:    Patient ID: [name] [name], male    DOB: 1940-10-17, 75 y.o.   MRN: [id number]   HPI PT presents to the office today to recheck acute bronchitis. PT was given rx of Zpak and steroid dose pack. PT reports feeling much better today and denies any cough or SOB. PT states the pollen has started bothering him and he is sneezing.    Review of Systems  Constitutional: Negative.   HENT: Negative.   Respiratory: Negative.   Cardiovascular: Negative.   Gastrointestinal: Negative.   Endocrine: Negative.   Genitourinary: Negative.   Musculoskeletal: Negative.   Neurological: Negative.   Hematological: Negative.   Psychiatric/Behavioral: Negative.   All other systems reviewed and are negative.      Objective:   Physical Exam  Constitutional: He is oriented to person, place, and time. He appears well-developed and well-nourished. No distress.  HENT:  Head: Normocephalic.  Right Ear: External ear normal.  Left Ear: External ear normal.  Mouth/Throat: Oropharynx is clear and moist.  Eyes: Pupils are equal, round, and reactive to light. Right eye exhibits no discharge. Left eye exhibits no discharge.  Neck: Normal range of motion. Neck supple. No thyromegaly present.  Cardiovascular: Normal rate, regular rhythm, normal heart sounds and intact distal pulses.   No murmur heard. Pulmonary/Chest: Effort normal and breath sounds normal. No respiratory distress. He has no wheezes.  Abdominal: Soft. Bowel sounds are normal. He exhibits no distension. There is no tenderness.  Musculoskeletal: Normal range of motion. He exhibits no edema or tenderness.  Neurological: He is alert and oriented to person, place, and time.  Skin: Skin is warm and dry. No rash noted. No erythema.  Psychiatric: He has a normal mood and affect. His behavior is normal. Judgment and thought content normal.  Vitals reviewed.     BP 135/78 mmHg  Pulse 55  Temp(Src) 98.3 F (36.8 C) (Oral)  Ht 5\' 8"   (1.727 m)  Wt 257 lb (116.574 kg)  BMI 39.09 kg/m2     Assessment & Plan:  1. Acute bronchitis, unspecified organism -Resolved -Continue medications and keep chronic follow up appts with PCP -Encouraged diet and exercise -RTO prn   Evelina Dun, FNP

## 2015-06-30 ENCOUNTER — Encounter: Payer: Self-pay | Admitting: *Deleted

## 2015-07-04 ENCOUNTER — Ambulatory Visit: Payer: PPO | Admitting: Family Medicine

## 2015-07-05 ENCOUNTER — Ambulatory Visit: Payer: PPO | Admitting: Family Medicine

## 2015-07-07 ENCOUNTER — Encounter (INDEPENDENT_AMBULATORY_CARE_PROVIDER_SITE_OTHER): Payer: Self-pay

## 2015-07-08 ENCOUNTER — Ambulatory Visit (INDEPENDENT_AMBULATORY_CARE_PROVIDER_SITE_OTHER): Payer: PPO | Admitting: Family Medicine

## 2015-07-08 ENCOUNTER — Encounter: Payer: Self-pay | Admitting: Family Medicine

## 2015-07-08 VITALS — BP 132/60 | HR 55 | Temp 96.7°F | Ht 68.0 in | Wt 260.6 lb

## 2015-07-08 DIAGNOSIS — Z23 Encounter for immunization: Secondary | ICD-10-CM | POA: Diagnosis not present

## 2015-07-08 DIAGNOSIS — E118 Type 2 diabetes mellitus with unspecified complications: Secondary | ICD-10-CM | POA: Diagnosis not present

## 2015-07-08 DIAGNOSIS — Z0000 Encounter for general adult medical examination without abnormal findings: Secondary | ICD-10-CM

## 2015-07-08 DIAGNOSIS — Z7901 Long term (current) use of anticoagulants: Secondary | ICD-10-CM | POA: Diagnosis not present

## 2015-07-08 DIAGNOSIS — I10 Essential (primary) hypertension: Secondary | ICD-10-CM

## 2015-07-08 DIAGNOSIS — I480 Paroxysmal atrial fibrillation: Secondary | ICD-10-CM

## 2015-07-08 LAB — BAYER DCA HB A1C WAIVED: HB A1C (BAYER DCA - WAIVED): 5.6 % (ref <7.0)

## 2015-07-08 LAB — COAGUCHEK XS/INR WAIVED
INR: 2.1 — ABNORMAL HIGH (ref 0.9–1.1)
Prothrombin Time: 24.6 s

## 2015-07-08 NOTE — Addendum Note (Signed)
Addended by: Karle Plumber on: 07/08/2015 08:37 AM   Modules accepted: Orders, SmartSet

## 2015-07-08 NOTE — Addendum Note (Signed)
Addended by: Liliane Bade on: 07/08/2015 08:42 AM   Modules accepted: Orders

## 2015-07-08 NOTE — Patient Instructions (Addendum)
Great to see you!  We will plan to do labs next visit in 4 months  Try to get back to the [facility] when you can.

## 2015-07-08 NOTE — Progress Notes (Signed)
   HPI  Patient presents today .  Patient states he is in good health, has no complaints.  Good Coumadin compliance, one half pill Monday Wednesday and Friday, one whole pill otherwise. Bleeding.  Hypertension Good medication compliance No chest pain, dyspnea, palpitations, leg edema.  Diabetes Very well controlled with metformin Watches his diet moderately, avoiding sodas altogether   PMH: Smoking status noted ROS: Per HPI  Objective: BP 132/60 mmHg  Pulse 55  Temp(Src) 96.7 F (35.9 C) (Oral)  Ht 5\' 8"  (1.727 m)  Wt 260 lb 9.6 oz (118.207 kg)  BMI 39.63 kg/m2 Gen: NAD, alert, cooperative with exam HEENT: NCAT, obscured by cerumen, left TM also obscured by cerumen with a small amount of blood CV: RRR, good S1/S2, no murmur Resp: CTABL, no wheezes, non-labored Ext: No edema, warm Neuro: Alert and oriented, No gross deficits  Diabetic Foot Exam - Simple   Simple Foot Form  Visual Inspection  No deformities, no ulcerations, no other skin breakdown bilaterally:  Yes  Sensation Testing  Intact to touch and monofilament testing bilaterally:  Yes  Pulse Check  Posterior Tibialis and Dorsalis pulse intact bilaterally:  Yes  Comments  Thick forefoot medial calluses bilaterally       Assessment and plan:  # Hypertension Well-controlled on lisinopril Labs 4 months ago looked very good, repeat on next visit, in 4 months   # Diabetes A1c pending today, expect good results will call with results given he is our first patient and lab is opening Watching diet, good med compliance Due for eye exam, foot exam today documented   # Healthcare maintenance Eye exam as above Prevnar given Urine microalbumin next visit   # A. fib, long-term use of anticoagulants On Coumadin for A. fib, INR: 2.1, no changes Rate controlled, continue diltiazem, flexion on for cardiology    Orders Placed This Encounter  Procedures  . [facility] Hb A1c Carney Bern,  MD [facility] 07/08/2015, 8:19 AM

## 2015-07-20 ENCOUNTER — Other Ambulatory Visit: Payer: Self-pay | Admitting: Family Medicine

## 2015-08-03 ENCOUNTER — Ambulatory Visit (INDEPENDENT_AMBULATORY_CARE_PROVIDER_SITE_OTHER): Payer: PPO | Admitting: Pharmacist

## 2015-08-03 DIAGNOSIS — I480 Paroxysmal atrial fibrillation: Secondary | ICD-10-CM

## 2015-08-03 LAB — COAGUCHEK XS/INR WAIVED
INR: 2.2 — ABNORMAL HIGH (ref 0.9–1.1)
PROTHROMBIN TIME: 26.9 s

## 2015-08-03 NOTE — Patient Instructions (Signed)
Anticoagulation Dose Instructions as of 08/03/2015      [name] [name] Tue Wed Thu Fri Sat   New Dose 5 mg 2.5 mg 5 mg 2.5 mg 5 mg 2.5 mg 5 mg    Description        Continue warfarin 5mg  to 1/2 tablet mondays, wednesdays and fridays and 1 tablet all other days.     INR was 2.2 today

## 2015-08-05 ENCOUNTER — Encounter: Payer: Self-pay | Admitting: Pharmacist

## 2015-08-29 NOTE — Progress Notes (Signed)
Cardiology Office Note    Date:  09/02/2015   ID:  KOHEI BOORTZ, DOB Mar 17, 1940, MRN [id number]  PCP:  Kenn File, MD  Cardiologist:  Kirk Ruths, MD    History of Present Illness:  [name] [name] is a 75 y.o. male for evaluation of atrial fibrillation. Previously seen in this office but not since 11/12. Previous catheterization in July 2006 showed no coronary artery disease and normal LV function.Echocardiogram September 10, 2007, showed normal LV function, mildly calcified aortic valve, and mild left atrial enlargement.Patient has had previous cardioversion and is maintained on flecainide. He has dyspnea with more extreme activities but not routine activities. No orthopnea, PND, pedal edema, chest pain, palpitations, syncope or bleeding.    Past Medical History  Diagnosis Date  . HYPERTENSION   . Atrial fibrillation ([place])   . ROTATOR CUFF TEAR   . HYPOTHYROIDISM   . Arthritis   . Hyperlipidemia   . Cataract   . Type 2 diabetes mellitus ([place]) 03/04/2014    Past Surgical History  Procedure Laterality Date  . Appendectomy    . Rotator cuff repair Left   . Knee surgery    . Shoulder surgery    . Total knee arthroplasty Bilateral   . Cataract extraction w/phaco Right 02/02/2014    Procedure: CATARACT EXTRACTION PHACO AND INTRAOCULAR LENS PLACEMENT (IOC);  Surgeon: Elta Guadeloupe T. Gershon Crane, MD;  Location: [facility];  Service: Ophthalmology;  Laterality: Right;  CDE 12.17  . Cataract extraction w/phaco Left 02/16/2014    Procedure: CATARACT EXTRACTION PHACO AND INTRAOCULAR LENS PLACEMENT ;  Surgeon: Elta Guadeloupe T. Gershon Crane, MD;  Location: [facility];  Service: Ophthalmology;  Laterality: Left;  CDE:13.42  . Eye surgery  2015    cataract surgery. bilateral  . Joint replacement  2009    bilateral knee replacement    Current Medications: Outpatient Prescriptions Prior to Visit  Medication Sig Dispense Refill  . albuterol (PROVENTIL HFA;VENTOLIN HFA) 108 (90 Base) MCG/ACT inhaler Inhale 2 puffs  into the lungs every 6 (six) hours as needed for wheezing or shortness of breath. 1 Inhaler 2  . atorvastatin (LIPITOR) 10 MG tablet Take 1 tablet (10 mg total) by mouth daily. 90 tablet 1  . diltiazem (CARDIZEM CD) 360 MG 24 hr capsule TAKE 1 CAPSULE (360 MG TOTAL) BY MOUTH DAILY. 90 capsule 1  . flecainide (TAMBOCOR) 100 MG tablet TAKE 1 TABLET (100 MG TOTAL) BY MOUTH 2 (TWO) TIMES DAILY. 180 tablet 1  . levothyroxine (SYNTHROID, LEVOTHROID) 100 MCG tablet Take 1 tablet (100 mcg total) by mouth daily. 90 tablet 3  . lisinopril (PRINIVIL,ZESTRIL) 20 MG tablet Take 1 tablet (20 mg total) by mouth daily. 90 tablet 1  . metFORMIN (GLUCOPHAGE XR) 500 MG 24 hr tablet Take 1 tablet (500 mg total) by mouth daily with breakfast. 90 tablet 3  . warfarin (COUMADIN) 5 MG tablet Take 0.5 to 1 tablet po qd per anticoagulation clinic 90 tablet 0   No facility-administered medications prior to visit.     Allergies:   Review of patient's allergies indicates no known allergies.   Social History   Social History  . Marital Status: Married    Spouse Name: N/A  . Number of Children: 2  . Years of Education: N/A   Social History Main Topics  . Smoking status: Never Smoker   . Smokeless tobacco: Never Used  . Alcohol Use: No  . Drug Use: No  . Sexual Activity: No   Other Topics Concern  .  None   Social History Narrative         Family History:  The patient's family history includes Asthma in his father; Early death in his brother; Heart attack in his brother and brother; Hypertension in his mother; Vision loss in his father.   ROS:   Please see the history of present illness.    No weight loss, productive cough, hemoptysis, dysphagia, odynophagia, melena, hematochezia, dysuria, hematuria, rash, seizure activity, orthopnea, PND, pedal edema, claudication. All remaining systems negative.   PHYSICAL EXAM:   VS:  BP 124/68 mmHg  Pulse 56  Ht 5\' 9"  (1.753 m)  Wt 268 lb (121.564 kg)  BMI 39.56  kg/m2   GEN: Well nourished, obese in no acute distress HEENT: normal Neck: no JVD, carotid bruits, or masses Cardiac: RRR; no murmurs, rubs, or gallops,no edema  Respiratory:  clear to auscultation bilaterally, normal work of breathing GI: soft, nontender, nondistended, no massess MS: no deformity or atrophy Skin: warm and dry, no rash Neuro:  Alert and Oriented x 3, Strength and sensation are intact Psych: euthymic mood, full affect  Wt Readings from Last 3 Encounters:  09/02/15 268 lb (121.564 kg)  07/08/15 260 lb 9.6 oz (118.207 kg)  06/09/15 257 lb (116.574 kg)      Studies/Labs Reviewed:   EKG:  EKG -Sinus bradycardia at a rate of 56. No ST changes.  Recent Labs: 03/02/2015: ALT 17; BUN 13; Creatinine, Ser 0.88; Platelets 234; Potassium 4.8; Sodium 139; TSH 2.370   Lipid Panel    Component Value Date/Time   CHOL 126 03/02/2015 0958   CHOL 122 07/17/2008   TRIG 105 03/02/2015 0958   TRIG 87 07/17/2008   HDL 42 03/02/2015 0958   HDL 35 07/17/2008   CHOLHDL 3.0 03/02/2015 0958   LDLCALC 63 03/02/2015 0958   LDLCALC 70 07/17/2008    Additional studies/ records that were reviewed today include:      Assessment and plan  1 Paroxysmal atrial fibrillation-patient is in sinus rhythm. Continue Cardizem for rate control if atrial fibrillation recurs. Continue flecainide to help maintain sinus rhythm. CHADS vasc 3. Continue coumadin. Pt given names of Doacs. We will convert if affordable based on input from his insurance company. 2 hyperlipidemia-continue statin. 3 hypertension-blood pressure controlled. Continue present medications.    Medication Adjustments/Labs and Tests Ordered: Current medicines are reviewed at length with the patient today.  Concerns regarding medicines are outlined above.  Medication changes, Labs and Tests ordered today are listed in the Patient Instructions below. There are no Patient Instructions on file for this visit.   Signed, Kirk Ruths, MD  09/02/2015 8:56 AM    [place]

## 2015-09-02 ENCOUNTER — Ambulatory Visit (INDEPENDENT_AMBULATORY_CARE_PROVIDER_SITE_OTHER): Payer: PPO | Admitting: Cardiology

## 2015-09-02 ENCOUNTER — Encounter: Payer: Self-pay | Admitting: Cardiology

## 2015-09-02 VITALS — BP 124/68 | HR 56 | Ht 69.0 in | Wt 268.0 lb

## 2015-09-02 DIAGNOSIS — E785 Hyperlipidemia, unspecified: Secondary | ICD-10-CM | POA: Diagnosis not present

## 2015-09-02 DIAGNOSIS — I10 Essential (primary) hypertension: Secondary | ICD-10-CM | POA: Diagnosis not present

## 2015-09-02 DIAGNOSIS — I480 Paroxysmal atrial fibrillation: Secondary | ICD-10-CM | POA: Diagnosis not present

## 2015-09-02 NOTE — Patient Instructions (Signed)
Your physician wants you to follow-up in: 1 year with Dr. Trellis Moment will receive a reminder letter in the mail two months in advance. If you don't receive a letter, please call our office to schedule the follow-up appointment.  Xarelto, Pradaxa, or Eliquis = blood thinners  If you need a refill on your cardiac medications before your next appointment, please call your pharmacy.

## 2015-09-14 ENCOUNTER — Encounter: Payer: Self-pay | Admitting: Pharmacist

## 2015-09-14 ENCOUNTER — Ambulatory Visit (INDEPENDENT_AMBULATORY_CARE_PROVIDER_SITE_OTHER): Payer: PPO | Admitting: Pharmacist

## 2015-09-14 DIAGNOSIS — I480 Paroxysmal atrial fibrillation: Secondary | ICD-10-CM | POA: Diagnosis not present

## 2015-09-14 DIAGNOSIS — R7303 Prediabetes: Secondary | ICD-10-CM | POA: Insufficient documentation

## 2015-09-14 LAB — COAGUCHEK XS/INR WAIVED
INR: 2.2 — AB (ref 0.9–1.1)
Prothrombin Time: 25.9 s

## 2015-09-14 MED ORDER — WARFARIN SODIUM 5 MG PO TABS: ORAL_TABLET | ORAL | Status: DC

## 2015-09-14 NOTE — Patient Instructions (Signed)
Anticoagulation Dose Instructions as of 09/14/2015      [name] [name] Tue Wed Thu Fri Sat   New Dose 5 mg 2.5 mg 5 mg 2.5 mg 5 mg 2.5 mg 5 mg    Description        Continue warfarin 5mg  to 1/2 tablet mondays, wednesdays and fridays and 1 tablet all other days.     INR was 2.2 today

## 2015-10-18 ENCOUNTER — Ambulatory Visit (INDEPENDENT_AMBULATORY_CARE_PROVIDER_SITE_OTHER): Payer: PPO | Admitting: Family Medicine

## 2015-10-18 ENCOUNTER — Encounter: Payer: Self-pay | Admitting: Family Medicine

## 2015-10-18 VITALS — BP 119/60 | HR 52 | Temp 97.2°F | Ht 69.0 in | Wt 274.0 lb

## 2015-10-18 DIAGNOSIS — L84 Corns and callosities: Secondary | ICD-10-CM

## 2015-10-18 DIAGNOSIS — R7303 Prediabetes: Secondary | ICD-10-CM | POA: Diagnosis not present

## 2015-10-18 DIAGNOSIS — I10 Essential (primary) hypertension: Secondary | ICD-10-CM | POA: Diagnosis not present

## 2015-10-18 DIAGNOSIS — Z7901 Long term (current) use of anticoagulants: Secondary | ICD-10-CM

## 2015-10-18 DIAGNOSIS — I480 Paroxysmal atrial fibrillation: Secondary | ICD-10-CM | POA: Diagnosis not present

## 2015-10-18 LAB — COAGUCHEK XS/INR WAIVED
INR: 2.1 — ABNORMAL HIGH (ref 0.9–1.1)
Prothrombin Time: 25.4 s

## 2015-10-18 NOTE — Progress Notes (Addendum)
   HPI  Patient presents today here for follow-up of prediabetes, atrial fibrillation, and hypertension.  Hypertension Good medication compliance, no chest pain, dyspnea, palpitations Not checking blood pressure routinely.  Prediabetes Watching his diet No formal exercise routine Not checking blood sugars Good metformin compliance and tolerance.  Atrial fibrillation Coumadin-good compliance No bleeding No increase or change in the amount of leafy greens he's eating. Good medication compliance with lichenified, managed by cardiology. No palpitations  PMH: Smoking status noted ROS: Per HPI  Objective: BP 119/60   Pulse (!) 52   Temp 97.2 F (36.2 C) (Oral)   Ht 5\' 9"  (1.753 m)   Wt 274 lb (124.3 kg)   BMI 40.46 kg/m  Gen: NAD, alert, cooperative with exam HEENT: NCAT CV: RRR, good S1/S2, no murmur Resp: CTABL, no wheezes, non-labored Ext: No edema, warm Neuro: Alert and oriented, No gross deficits  Diabetic Foot Exam - Simple   Simple Foot Form Visual Inspection See comments:  Yes Sensation Testing Intact to touch and monofilament testing bilaterally:  Yes Pulse Check Posterior Tibialis and Dorsalis pulse intact bilaterally:  Yes Comments Left foot with moderate size callus of the left distal arch, small crack present without any induration, redness, or drainage.      Assessment and plan:  # Hypertension Well controlled Continue lisinopril Labs are up-to-date  # Prediabetes Watching diet Continue metformin A1c every 4-6 months  # Paroxysmal atrial fibrillation, chronic anticoagulant use. INR today is therapeutic Continue current Coumadin doses, 2.5 mg on Monday, Wednesday, Friday and 5 mg on all other days.  Foot callus Discussed filing down the foot callus and applying Vaseline or other emollient twice daily until the crack that is present has healed. Offered podiatry, patient wants to defer this for now.   Orders Placed This Encounter    Procedures  . CoaguChek XS/INR Carney Bern, MD [facility] 10/18/2015, 8:29 AM

## 2015-10-18 NOTE — Patient Instructions (Signed)
Great to see you!  Lets see you again in 4 months  Come back sooner if you need [place] for any reason.

## 2015-11-15 ENCOUNTER — Other Ambulatory Visit: Payer: Self-pay | Admitting: Family Medicine

## 2015-11-22 ENCOUNTER — Ambulatory Visit (INDEPENDENT_AMBULATORY_CARE_PROVIDER_SITE_OTHER): Payer: PPO | Admitting: Pharmacist

## 2015-11-22 DIAGNOSIS — I480 Paroxysmal atrial fibrillation: Secondary | ICD-10-CM

## 2015-11-22 LAB — COAGUCHEK XS/INR WAIVED
INR: 2.2 — AB (ref 0.9–1.1)
Prothrombin Time: 26.3 s

## 2015-12-06 ENCOUNTER — Ambulatory Visit: Payer: Self-pay

## 2015-12-06 ENCOUNTER — Ambulatory Visit (INDEPENDENT_AMBULATORY_CARE_PROVIDER_SITE_OTHER): Payer: PPO

## 2015-12-06 DIAGNOSIS — Z23 Encounter for immunization: Secondary | ICD-10-CM

## 2015-12-10 ENCOUNTER — Other Ambulatory Visit: Payer: Self-pay | Admitting: Pharmacist

## 2015-12-27 ENCOUNTER — Ambulatory Visit: Payer: Self-pay | Admitting: Family Medicine

## 2016-01-16 ENCOUNTER — Encounter: Payer: Self-pay | Admitting: Family Medicine

## 2016-01-16 ENCOUNTER — Ambulatory Visit (INDEPENDENT_AMBULATORY_CARE_PROVIDER_SITE_OTHER): Payer: PPO | Admitting: Family Medicine

## 2016-01-16 ENCOUNTER — Encounter (INDEPENDENT_AMBULATORY_CARE_PROVIDER_SITE_OTHER): Payer: Self-pay

## 2016-01-16 VITALS — BP 111/55 | HR 53 | Temp 97.2°F | Ht 69.0 in | Wt 278.6 lb

## 2016-01-16 DIAGNOSIS — R7303 Prediabetes: Secondary | ICD-10-CM

## 2016-01-16 DIAGNOSIS — L84 Corns and callosities: Secondary | ICD-10-CM

## 2016-01-16 DIAGNOSIS — Z7901 Long term (current) use of anticoagulants: Secondary | ICD-10-CM | POA: Diagnosis not present

## 2016-01-16 DIAGNOSIS — I10 Essential (primary) hypertension: Secondary | ICD-10-CM

## 2016-01-16 DIAGNOSIS — I480 Paroxysmal atrial fibrillation: Secondary | ICD-10-CM | POA: Diagnosis not present

## 2016-01-16 LAB — BAYER DCA HB A1C WAIVED: HB A1C (BAYER DCA - WAIVED): 5.8 % (ref <7.0)

## 2016-01-16 LAB — COAGUCHEK XS/INR WAIVED
INR: 2.4 — AB (ref 0.9–1.1)
Prothrombin Time: 29.1 s

## 2016-01-16 NOTE — Patient Instructions (Addendum)
Great to see you!  I am glad your wife is getting better. Come back to see Tammy for INR checks as usual.   Come back to see me in 4-5 months

## 2016-01-16 NOTE — Progress Notes (Signed)
   HPI  Patient presents today here to follow-up for prediabetes, hypertension, long-term anticoagulation, and foot calluses.  Foot calluses Left foot callus greater than right, his wife was previously able to help him with it, however she got very ill last month and was hospitalized for about 2 weeks. She still has a wound VAC and is unable to help him. He has a left foot callus that is almost open.  Hypertension Good medication compliance, no chest pain, dyspnea, leg edema.  Prediabetes Was previously watching his diet very closely, however the last 6 weeks with his wife being sick he's had difficulty watching his diet and exercising.  Atrial fibrillation No bleeding with anticoagulation Good medication compliance with diltiazem No palpitations or tachycardia  PMH: Smoking status noted ROS: Per HPI  Objective: BP (!) 111/55   Pulse (!) 53   Temp 97.2 F (36.2 C) (Oral)   Ht '5\' 9"'$  (1.753 m)   Wt 278 lb 9.6 oz (126.4 kg)   BMI 41.14 kg/m  Gen: NAD, alert, cooperative with exam HEENT: NCAT CV: RRR, good S1/S2, no murmur Resp: CTABL, no wheezes, non-labored Ext: No edema, warm Neuro: Alert and oriented, No gross deficits  Assessment and plan:  # Hypertension Well-controlled on lisinopril and diltiazem Continue current medications Repeat labs  # Atrial fibrillation, long-term anticoagulation INR at goal Continue current Coumadin dose Rate controlled  # Foot callus Severe left-sided foot callus with what appears to be pre-ulceration Referring to podiatry  Prediabetes Doing well, A1c stable Discussed diet and exercise  Orders Placed This Encounter  Procedures  . [facility] DCA Hb A1c Waived  . CoaguChek XS/INR Waived  . CBC with Differential/Platelet  . CMP14+EGFR  . LDL Cholesterol, Direct  . Ambulatory referral to Podiatry    Referral Priority:   Routine    Referral Type:   Consultation    Referral Reason:   Specialty Services Required    Requested  Specialty:   Podiatry    Number of Visits Requested:   [place], MD [facility] 01/16/2016, 3:25 PM

## 2016-01-17 LAB — CMP14+EGFR
A/G RATIO: 1.8 (ref 1.2–2.2)
ALBUMIN: 4.4 g/dL (ref 3.5–4.8)
ALK PHOS: 55 IU/L (ref 39–117)
ALT: 20 IU/L (ref 0–44)
AST: 16 IU/L (ref 0–40)
BILIRUBIN TOTAL: 0.5 mg/dL (ref 0.0–1.2)
BUN / CREAT RATIO: 16 (ref 10–24)
BUN: 14 mg/dL (ref 8–27)
CHLORIDE: 100 mmol/L (ref 96–106)
CO2: 21 mmol/L (ref 18–29)
CREATININE: 0.88 mg/dL (ref 0.76–1.27)
Calcium: 8.7 mg/dL (ref 8.6–10.2)
GFR calc Af Amer: 99 mL/min/{1.73_m2} (ref >59)
GFR calc non Af Amer: 85 mL/min/{1.73_m2} (ref >59)
GLOBULIN, TOTAL: 2.5 g/dL (ref 1.5–4.5)
Glucose: 79 mg/dL (ref 65–99)
POTASSIUM: 4.5 mmol/L (ref 3.5–5.2)
SODIUM: 140 mmol/L (ref 134–144)
Total Protein: 6.9 g/dL (ref 6.0–8.5)

## 2016-01-17 LAB — CBC WITH DIFFERENTIAL/PLATELET
BASOS ABS: 0.1 10*3/uL (ref 0.0–0.2)
BASOS: 1 %
EOS (ABSOLUTE): 0.2 10*3/uL (ref 0.0–0.4)
Eos: 2 %
Hematocrit: 40.8 % (ref 37.5–51.0)
Hemoglobin: 13.7 g/dL (ref 12.6–17.7)
Immature Grans (Abs): 0 10*3/uL (ref 0.0–0.1)
Immature Granulocytes: 0 %
Lymphocytes Absolute: 2.6 10*3/uL (ref 0.7–3.1)
Lymphs: 30 %
MCH: 29.1 pg (ref 26.6–33.0)
MCHC: 33.6 g/dL (ref 31.5–35.7)
MCV: 87 fL (ref 79–97)
MONOS ABS: 1.1 10*3/uL — AB (ref 0.1–0.9)
Monocytes: 12 %
NEUTROS ABS: 4.8 10*3/uL (ref 1.4–7.0)
NEUTROS PCT: 55 %
PLATELETS: 231 10*3/uL (ref 150–379)
RBC: 4.7 x10E6/uL (ref 4.14–5.80)
RDW: 14 % (ref 12.3–15.4)
WBC: 8.8 10*3/uL (ref 3.4–10.8)

## 2016-01-17 LAB — LDL CHOLESTEROL, DIRECT: LDL DIRECT: 65 mg/dL (ref 0–99)

## 2016-01-22 ENCOUNTER — Other Ambulatory Visit: Payer: Self-pay | Admitting: Family Medicine

## 2016-01-30 DIAGNOSIS — Z1283 Encounter for screening for malignant neoplasm of skin: Secondary | ICD-10-CM | POA: Diagnosis not present

## 2016-01-30 DIAGNOSIS — Z85828 Personal history of other malignant neoplasm of skin: Secondary | ICD-10-CM | POA: Diagnosis not present

## 2016-01-30 DIAGNOSIS — Z08 Encounter for follow-up examination after completed treatment for malignant neoplasm: Secondary | ICD-10-CM | POA: Diagnosis not present

## 2016-02-07 ENCOUNTER — Encounter: Payer: Self-pay | Admitting: Pharmacist

## 2016-02-07 DIAGNOSIS — E1142 Type 2 diabetes mellitus with diabetic polyneuropathy: Secondary | ICD-10-CM | POA: Diagnosis not present

## 2016-02-07 DIAGNOSIS — L84 Corns and callosities: Secondary | ICD-10-CM | POA: Diagnosis not present

## 2016-02-07 DIAGNOSIS — B351 Tinea unguium: Secondary | ICD-10-CM | POA: Diagnosis not present

## 2016-02-28 ENCOUNTER — Encounter: Payer: Self-pay | Admitting: Pharmacist

## 2016-03-01 ENCOUNTER — Ambulatory Visit (INDEPENDENT_AMBULATORY_CARE_PROVIDER_SITE_OTHER): Payer: PPO | Admitting: Pharmacist

## 2016-03-01 DIAGNOSIS — I480 Paroxysmal atrial fibrillation: Secondary | ICD-10-CM | POA: Diagnosis not present

## 2016-03-01 LAB — COAGUCHEK XS/INR WAIVED
INR: 2.7 — AB (ref 0.9–1.1)
PROTHROMBIN TIME: 31.9 s

## 2016-04-03 ENCOUNTER — Other Ambulatory Visit: Payer: Self-pay | Admitting: Family Medicine

## 2016-04-12 ENCOUNTER — Ambulatory Visit (INDEPENDENT_AMBULATORY_CARE_PROVIDER_SITE_OTHER): Payer: PPO | Admitting: Pharmacist

## 2016-04-12 DIAGNOSIS — I480 Paroxysmal atrial fibrillation: Secondary | ICD-10-CM

## 2016-04-12 LAB — COAGUCHEK XS/INR WAIVED
INR: 2.2 — AB (ref 0.9–1.1)
PROTHROMBIN TIME: 25.9 s

## 2016-04-14 ENCOUNTER — Other Ambulatory Visit: Payer: Self-pay | Admitting: Family Medicine

## 2016-04-22 ENCOUNTER — Other Ambulatory Visit: Payer: Self-pay | Admitting: Family Medicine

## 2016-04-27 ENCOUNTER — Other Ambulatory Visit: Payer: Self-pay | Admitting: Family Medicine

## 2016-05-01 DIAGNOSIS — L84 Corns and callosities: Secondary | ICD-10-CM | POA: Diagnosis not present

## 2016-05-01 DIAGNOSIS — E1142 Type 2 diabetes mellitus with diabetic polyneuropathy: Secondary | ICD-10-CM | POA: Diagnosis not present

## 2016-05-01 DIAGNOSIS — B351 Tinea unguium: Secondary | ICD-10-CM | POA: Diagnosis not present

## 2016-05-14 ENCOUNTER — Other Ambulatory Visit: Payer: Self-pay | Admitting: Family Medicine

## 2016-05-17 ENCOUNTER — Ambulatory Visit: Payer: PPO | Admitting: Family Medicine

## 2016-05-22 ENCOUNTER — Ambulatory Visit (INDEPENDENT_AMBULATORY_CARE_PROVIDER_SITE_OTHER): Payer: PPO | Admitting: Family Medicine

## 2016-05-22 ENCOUNTER — Encounter: Payer: Self-pay | Admitting: Family Medicine

## 2016-05-22 VITALS — BP 125/66 | HR 52 | Temp 97.0°F | Ht 69.0 in | Wt 280.6 lb

## 2016-05-22 DIAGNOSIS — R7303 Prediabetes: Secondary | ICD-10-CM

## 2016-05-22 DIAGNOSIS — E039 Hypothyroidism, unspecified: Secondary | ICD-10-CM | POA: Diagnosis not present

## 2016-05-22 DIAGNOSIS — I480 Paroxysmal atrial fibrillation: Secondary | ICD-10-CM

## 2016-05-22 LAB — COAGUCHEK XS/INR WAIVED
INR: 2.1 — ABNORMAL HIGH (ref 0.9–1.1)
Prothrombin Time: 25.4 s

## 2016-05-22 NOTE — Patient Instructions (Signed)
Great to see you!  We will call with labs within 1 week.   Lets follow up in 4 months unless you need [place] sooner.

## 2016-05-22 NOTE — Progress Notes (Signed)
   HPI  Patient presents today here for follow-up chronic medical conditions.  Atrial fibrillation Good Coumadin compliance, also taking diltiazem.  Prediabetes Patient not watching his diet very closely, he is taking metformin as prescribed, once daily.  Hypothyroidism Good medication compliance   PMH: Smoking status noted ROS: Per HPI  Objective: BP 125/66   Pulse (!) 52   Temp 97 F (36.1 C) (Oral)   Ht _0  (1.753 m)   Wt 280 lb 9.6 oz (127.3 kg)   BMI 41.44 kg/m  Gen: NAD, alert, cooperative with exam HEENT: NCAT CV: RRR, good S1/S2, no murmur Resp: CTABL, no wheezes, non-labored Ext: No edema, warm Neuro: Alert and oriented, No gross deficits  Assessment and plan:  # Paroxysmal atrial fibrillation Rate controlled, INR therapeutic No change in Coumadin dose ( 2.5 mg MWF, 5 mg other days) prediabetes Discussed therapeutic lifestyle changes Plan to check A1c about every 6 months A1c next visit  # hypothyroidism Rechecking TSH, change in Synthroid dose for now    Orders Placed This Encounter  Procedures  . TSH  . [place], MD [facility] 05/22/2016, 10:55 AM

## 2016-05-23 LAB — CMP14+EGFR
ALK PHOS: 51 IU/L (ref 39–117)
ALT: 23 IU/L (ref 0–44)
AST: 16 IU/L (ref 0–40)
Albumin/Globulin Ratio: 1.3 (ref 1.2–2.2)
Albumin: 4.1 g/dL (ref 3.5–4.8)
BILIRUBIN TOTAL: 0.4 mg/dL (ref 0.0–1.2)
BUN/Creatinine Ratio: 17 (ref 10–24)
BUN: 15 mg/dL (ref 8–27)
CHLORIDE: 100 mmol/L (ref 96–106)
CO2: 23 mmol/L (ref 18–29)
Calcium: 8.9 mg/dL (ref 8.6–10.2)
Creatinine, Ser: 0.9 mg/dL (ref 0.76–1.27)
GFR calc Af Amer: 98 mL/min/{1.73_m2} (ref >59)
GFR calc non Af Amer: 84 mL/min/{1.73_m2} (ref >59)
GLUCOSE: 105 mg/dL — AB (ref 65–99)
Globulin, Total: 3.1 g/dL (ref 1.5–4.5)
Potassium: 4.6 mmol/L (ref 3.5–5.2)
Sodium: 139 mmol/L (ref 134–144)
Total Protein: 7.2 g/dL (ref 6.0–8.5)

## 2016-05-23 LAB — TSH: TSH: 3.07 u[IU]/mL (ref 0.450–4.500)

## 2016-06-12 ENCOUNTER — Other Ambulatory Visit: Payer: Self-pay | Admitting: Family Medicine

## 2016-06-20 ENCOUNTER — Ambulatory Visit (INDEPENDENT_AMBULATORY_CARE_PROVIDER_SITE_OTHER): Payer: PPO | Admitting: Family Medicine

## 2016-06-20 ENCOUNTER — Ambulatory Visit (INDEPENDENT_AMBULATORY_CARE_PROVIDER_SITE_OTHER): Payer: PPO

## 2016-06-20 ENCOUNTER — Encounter: Payer: Self-pay | Admitting: Family Medicine

## 2016-06-20 VITALS — BP 97/53 | HR 57 | Temp 97.1°F | Ht 69.0 in | Wt 274.8 lb

## 2016-06-20 DIAGNOSIS — J181 Lobar pneumonia, unspecified organism: Secondary | ICD-10-CM | POA: Diagnosis not present

## 2016-06-20 DIAGNOSIS — J189 Pneumonia, unspecified organism: Secondary | ICD-10-CM

## 2016-06-20 MED ORDER — CEFDINIR 300 MG PO CAPS: 300.0000 mg | ORAL_CAPSULE | Freq: Two times a day (BID) | ORAL | 0 refills | Status: DC

## 2016-06-20 MED ORDER — AZITHROMYCIN 250 MG PO TABS: ORAL_TABLET | ORAL | 0 refills | Status: DC

## 2016-06-20 NOTE — Patient Instructions (Addendum)
Great to see you!  Stop lisinopril for 3 days  Take azithromycin and cefdinir for your cough  Come back as scheduled for your AWV   Community-Acquired Pneumonia, Adult Pneumonia is an infection of the lungs. There are different types of pneumonia. One type can develop while a person is in a hospital. A different type, called community-acquired pneumonia, develops in people who are not, or have not recently been, in the hospital or other health care facility. What are the causes? Pneumonia may be caused by bacteria, viruses, or funguses. Community-acquired pneumonia is often caused by Streptococcus pneumonia bacteria. These bacteria are often passed from one person to another by breathing in droplets from the cough or sneeze of an infected person. What increases the risk? The condition is more likely to develop in:  People who havechronic diseases, such as chronic obstructive pulmonary disease (COPD), asthma, congestive heart failure, cystic fibrosis, diabetes, or kidney disease.  People who haveearly-stage or late-stage HIV.  People who havesickle cell disease.  People who havehad their spleen removed (splenectomy).  People who havepoor Human resources officer.  People who havemedical conditions that increase the risk of breathing in (aspirating) secretions their own mouth and nose.  People who havea weakened immune system (immunocompromised).  People who smoke.  People whotravel to areas where pneumonia-causing germs commonly exist.  People whoare around animal habitats or animals that have pneumonia-causing germs, including birds, bats, rabbits, cats, and farm animals. What are the signs or symptoms? Symptoms of this condition include:  Adry cough.  A wet (productive) cough.  Fever.  Sweating.  Chest pain, especially when breathing deeply or coughing.  Rapid breathing or difficulty breathing.  Shortness of breath.  Shaking chills.  Fatigue.  Muscle  aches. How is this diagnosed? Your health care provider will take a medical history and perform a physical exam. You may also have other tests, including:  Imaging studies of your chest, including X-rays.  Tests to check your blood oxygen level and other blood gases.  Other tests on blood, mucus (sputum), fluid around your lungs (pleural fluid), and urine. If your pneumonia is severe, other tests may be done to identify the specific cause of your illness. How is this treated? The type of treatment that you receive depends on many factors, such as the cause of your pneumonia, the medicines you take, and other medical conditions that you have. For most adults, treatment and recovery from pneumonia may occur at home. In some cases, treatment must happen in a hospital. Treatment may include:  Antibiotic medicines, if the pneumonia was caused by bacteria.  Antiviral medicines, if the pneumonia was caused by a virus.  Medicines that are given by mouth or through an IV tube.  Oxygen.  Respiratory therapy. Although rare, treating severe pneumonia may include:  Mechanical ventilation. This is done if you are not breathing well on your own and you cannot maintain a safe blood oxygen level.  Thoracentesis. This procedureremoves fluid around one lung or both lungs to help you breathe better. Follow these instructions at home:  Take over-the-counter and prescription medicines only as told by your health care provider.  Only takecough medicine if you are losing sleep. Understand that cough medicine can prevent your body's natural ability to remove mucus from your lungs.  If you were prescribed an antibiotic medicine, take it as told by your health care provider. Do not stop taking the antibiotic even if you start to feel better.  Sleep in a semi-upright position at night.  Try sleeping in a reclining chair, or place a few pillows under your head.  Do not use tobacco products, including  cigarettes, chewing tobacco, and e-cigarettes. If you need help quitting, ask your health care provider.  Drink enough water to keep your urine clear or pale yellow. This will help to thin out mucus secretions in your lungs. How is this prevented? There are ways that you can decrease your risk of developing community-acquired pneumonia. Consider getting a pneumococcal vaccine if:  You are older than 74 years of age.  You are older than 74 years of age and are undergoing cancer treatment, have chronic lung disease, or have other medical conditions that affect your immune system. Ask your health care provider if this applies to you. There are different types and schedules of pneumococcal vaccines. Ask your health care provider which vaccination option is best for you. You may also prevent community-acquired pneumonia if you take these actions:  Get an influenza vaccine every year. Ask your health care provider which type of influenza vaccine is best for you.  Go to the dentist on a regular basis.  Wash your hands often. Use hand sanitizer if soap and water are not available. Contact a health care provider if:  You have a fever.  You are losing sleep because you cannot control your cough with cough medicine. Get help right away if:  You have worsening shortness of breath.  You have increased chest pain.  Your sickness becomes worse, especially if you are an older adult or have a weakened immune system.  You cough up blood. This information is not intended to replace advice given to you by your health care provider. Make sure you discuss any questions you have with your health care provider. Document Released: 02/12/2005 Document Revised: 06/23/2015 Document Reviewed: 06/09/2014 Elsevier Interactive Patient Education  2017 [employer].

## 2016-06-20 NOTE — Progress Notes (Signed)
   HPI  Patient presents today with cough.  Patient explains that he's had 5 days of cough productive of yellow sputum. He isn't nasal congestion,. He also complains of shortness of breath and malaise.  Patient states that he had left-sided low back pain that started this morning and has improved somewhat.  Patient had subjective fever, no measured fever. He's tolerating food and fluids like usual.  Patient is taking all his medications like usual. Denies any dizziness or presyncope. He has had slight lightheadedness.  PMH: Smoking status noted ROS: Per HPI  Objective: BP (!) 97/53   Pulse (!) 57   Temp 97.1 F (36.2 C) (Oral)   Ht _0  (1.753 m)   Wt 274 lb 12.8 oz (124.6 kg)   BMI 40.58 kg/m  Gen: NAD, alert, cooperative with exam HEENT: NCAT, right TM within normal limits, left TM obscured by cerumen, oropharynx moist and clear, nares clear CV: RRR, good S1/S2, no murmur Resp: Nonlabored, good air movement, added sounds throughout most notable in the left lower and left upper lung fields Ext: No edema, warm Neuro: Alert and oriented, No gross deficits  Assessment and plan:  # Community-acquired pneumonia Clinical diagnosis, plain film is not impressive. Patient with lower blood pressure compared to usual, productive cough, and added breath sounds throughout most significant on the left side. Patient on Flcanide and Coumadin, treating with azithro+omnicef ( previous Qtc 395) Hold lisinopril for 3 days RTC with any concerns  Azithro will likely affect INR, encouraged to keep AWV with our clinical pharmacist in about 1 week.   Plain film chest - no acute findings, awaiting radiology read   Orders Placed This Encounter  Procedures  . DG Chest 2 View    Standing Status:   Future    Number of Occurrences:   1    Standing Expiration Date:   08/20/2017    Order Specific Question:   Reason for Exam (SYMPTOM  OR DIAGNOSIS REQUIRED)    Answer:   cough, r/o CAP   Order Specific Question:   Preferred imaging location?    Answer:   Internal    Order Specific Question:   Radiology Contrast Protocol - do NOT remove file path    Answer:   [email or web address]  . CMP14+EGFR  . CBC with Differential/Platelet    Meds ordered this encounter  Medications  . azithromycin (ZITHROMAX) 250 MG tablet    Sig: Take 2 tablets on day 1 and 1 tablet daily after that    Dispense:  6 tablet    Refill:  0  . cefdinir (OMNICEF) 300 MG capsule    Sig: Take 1 capsule (300 mg total) by mouth 2 (two) times daily. 1 po BID    Dispense:  20 capsule    Refill:  0    Laroy Apple, MD [facility] 06/20/2016, 5:11 PM

## 2016-06-21 LAB — CBC WITH DIFFERENTIAL/PLATELET
BASOS ABS: 0 10*3/uL (ref 0.0–0.2)
Basos: 1 %
EOS (ABSOLUTE): 0.1 10*3/uL (ref 0.0–0.4)
Eos: 1 %
HEMATOCRIT: 43.6 % (ref 37.5–51.0)
Hemoglobin: 14.9 g/dL (ref 13.0–17.7)
Immature Grans (Abs): 0 10*3/uL (ref 0.0–0.1)
Immature Granulocytes: 0 %
LYMPHS ABS: 1.8 10*3/uL (ref 0.7–3.1)
LYMPHS: 31 %
MCH: 29.4 pg (ref 26.6–33.0)
MCHC: 34.2 g/dL (ref 31.5–35.7)
MCV: 86 fL (ref 79–97)
MONOCYTES: 22 %
Monocytes Absolute: 1.3 10*3/uL — ABNORMAL HIGH (ref 0.1–0.9)
NEUTROS PCT: 45 %
Neutrophils Absolute: 2.6 10*3/uL (ref 1.4–7.0)
PLATELETS: 169 10*3/uL (ref 150–379)
RBC: 5.06 x10E6/uL (ref 4.14–5.80)
RDW: 14.3 % (ref 12.3–15.4)
WBC: 5.8 10*3/uL (ref 3.4–10.8)

## 2016-06-21 LAB — CMP14+EGFR
ALK PHOS: 54 IU/L (ref 39–117)
ALT: 21 IU/L (ref 0–44)
AST: 21 IU/L (ref 0–40)
Albumin/Globulin Ratio: 1.5 (ref 1.2–2.2)
Albumin: 4.1 g/dL (ref 3.5–4.8)
BILIRUBIN TOTAL: 0.3 mg/dL (ref 0.0–1.2)
BUN / CREAT RATIO: 14 (ref 10–24)
BUN: 18 mg/dL (ref 8–27)
CHLORIDE: 95 mmol/L — AB (ref 96–106)
CO2: 24 mmol/L (ref 18–29)
Calcium: 8.6 mg/dL (ref 8.6–10.2)
Creatinine, Ser: 1.3 mg/dL — ABNORMAL HIGH (ref 0.76–1.27)
GFR calc Af Amer: 63 mL/min/{1.73_m2} (ref >59)
GFR calc non Af Amer: 54 mL/min/{1.73_m2} — ABNORMAL LOW (ref >59)
GLUCOSE: 97 mg/dL (ref 65–99)
Globulin, Total: 2.8 g/dL (ref 1.5–4.5)
Potassium: 4.3 mmol/L (ref 3.5–5.2)
Sodium: 135 mmol/L (ref 134–144)
Total Protein: 6.9 g/dL (ref 6.0–8.5)

## 2016-06-28 ENCOUNTER — Ambulatory Visit: Payer: Self-pay | Admitting: Pharmacist

## 2016-06-28 ENCOUNTER — Other Ambulatory Visit: Payer: Self-pay | Admitting: Family Medicine

## 2016-06-29 ENCOUNTER — Ambulatory Visit (INDEPENDENT_AMBULATORY_CARE_PROVIDER_SITE_OTHER): Payer: PPO | Admitting: Pediatrics

## 2016-06-29 ENCOUNTER — Encounter: Payer: Self-pay | Admitting: Pediatrics

## 2016-06-29 VITALS — BP 114/59 | HR 62 | Temp 97.9°F | Resp 20 | Ht 69.0 in | Wt 276.2 lb

## 2016-06-29 DIAGNOSIS — J181 Lobar pneumonia, unspecified organism: Secondary | ICD-10-CM | POA: Diagnosis not present

## 2016-06-29 DIAGNOSIS — N289 Disorder of kidney and ureter, unspecified: Secondary | ICD-10-CM | POA: Diagnosis not present

## 2016-06-29 DIAGNOSIS — I4891 Unspecified atrial fibrillation: Secondary | ICD-10-CM | POA: Diagnosis not present

## 2016-06-29 DIAGNOSIS — R062 Wheezing: Secondary | ICD-10-CM

## 2016-06-29 DIAGNOSIS — J189 Pneumonia, unspecified organism: Secondary | ICD-10-CM

## 2016-06-29 DIAGNOSIS — I480 Paroxysmal atrial fibrillation: Secondary | ICD-10-CM

## 2016-06-29 LAB — BMP8+EGFR
BUN / CREAT RATIO: 12 (ref 10–24)
BUN: 12 mg/dL (ref 8–27)
CALCIUM: 8.9 mg/dL (ref 8.6–10.2)
CHLORIDE: 103 mmol/L (ref 96–106)
CO2: 22 mmol/L (ref 18–29)
Creatinine, Ser: 1 mg/dL (ref 0.76–1.27)
GFR calc Af Amer: 86 mL/min/{1.73_m2} (ref >59)
GFR calc non Af Amer: 74 mL/min/{1.73_m2} (ref >59)
GLUCOSE: 101 mg/dL — AB (ref 65–99)
Potassium: 4.2 mmol/L (ref 3.5–5.2)
Sodium: 139 mmol/L (ref 134–144)

## 2016-06-29 LAB — COAGUCHEK XS/INR WAIVED
INR: 2.7 — AB (ref 0.9–1.1)
PROTHROMBIN TIME: 32.8 s

## 2016-06-29 MED ORDER — SPACER/AERO CHAMBER MOUTHPIECE MISC: 1.0000 | Freq: Four times a day (QID) | 0 refills | Status: DC | PRN

## 2016-06-29 MED ORDER — ALBUTEROL SULFATE HFA 108 (90 BASE) MCG/ACT IN AERS: 2.0000 | INHALATION_SPRAY | Freq: Four times a day (QID) | RESPIRATORY_TRACT | 2 refills | Status: DC | PRN

## 2016-06-29 NOTE — Progress Notes (Signed)
  Subjective:   Patient ID: [name] [name], male    DOB: [birth year], 76 y.o.   MRN: [id number] CC: Pneumonia (Diagnosed 1 week ago)  HPI: [name] [name] is a 76 y.o. male presenting for Pneumonia (Diagnosed 1 week ago)  Diagnosed one week ago Has not yet done with course of abx Continues to have low energy Still coughing Feels light headed with coughing at times Still coughing some up Not as bad as it was No fevers  Eating and drinking well, though not quite back to normal Drinking lots of fluids  On warfarin for atrial fibrillation Due for INR recheck with recent abx  Relevant past medical, surgical, family and social history reviewed. Allergies and medications reviewed and updated. History  Smoking Status  . Never Smoker  Smokeless Tobacco  . Never Used   ROS: Per HPI   Objective:    BP (!) 114/59   Pulse 62   Temp 97.9 F (36.6 C) (Oral)   Resp 20   Ht '5\' 9"'$  (1.753 m)   Wt 276 lb 3.2 oz (125.3 kg)   SpO2 98%   BMI 40.79 kg/m   Wt Readings from Last 3 Encounters:  06/29/16 276 lb 3.2 oz (125.3 kg)  06/20/16 274 lb 12.8 oz (124.6 kg)  05/22/16 280 lb 9.6 oz (127.3 kg)    Gen: NAD, alert, cooperative with exam, NCAT EYES: EOMI, no conjunctival injection, or no icterus ENT:  TMs pearly gray b/l, OP without erythema LYMPH: no cervical LAD CV: NRRR, normal S1/S2, no murmur, distal pulses 2+ b/l Resp: moving air well, slight exp wheeze b/l, no crackles, normal WOB Abd: +BS, soft, NTND.  Ext: No edema, warm Neuro: Alert and oriented  Assessment & Plan:  [name] [name] was seen today for pneumonia, fatigue  Diagnoses and all orders for this visit:  Community acquired pneumonia of left lower lobe of lung ([place]) Symptoms improving slowly, cont antibiotics until course completed  Atrial fibrillation, unspecified type (HCC) INR 2.7, cont current warfarin dosing -     BMP8+EGFR -     CoaguChek XS/INR Waived  Wheezing Slight wheeze on exam, start below, use with  spacer -     albuterol (PROVENTIL HFA;VENTOLIN HFA) 108 (90 Base) MCG/ACT inhaler; Inhale 2 puffs into the lungs every 6 (six) hours as needed for wheezing or shortness of breath. -     Spacer/Aero Chamber Mouthpiece MISC; 1 each by Does not apply route every 6 (six) hours as needed.   Follow up plan: With Dr. Wendi Snipes as scheduled Assunta Found, MD [place]

## 2016-07-10 ENCOUNTER — Ambulatory Visit: Payer: Self-pay | Admitting: Pharmacist

## 2016-07-10 DIAGNOSIS — M79673 Pain in unspecified foot: Secondary | ICD-10-CM | POA: Diagnosis not present

## 2016-07-10 DIAGNOSIS — E1142 Type 2 diabetes mellitus with diabetic polyneuropathy: Secondary | ICD-10-CM | POA: Diagnosis not present

## 2016-07-10 DIAGNOSIS — L84 Corns and callosities: Secondary | ICD-10-CM | POA: Diagnosis not present

## 2016-07-10 DIAGNOSIS — B351 Tinea unguium: Secondary | ICD-10-CM | POA: Diagnosis not present

## 2016-07-12 ENCOUNTER — Encounter: Payer: Self-pay | Admitting: Pharmacist

## 2016-07-12 ENCOUNTER — Ambulatory Visit (INDEPENDENT_AMBULATORY_CARE_PROVIDER_SITE_OTHER): Payer: PPO | Admitting: Pharmacist

## 2016-07-12 VITALS — BP 122/62 | HR 72 | Ht 69.0 in | Wt 280.0 lb

## 2016-07-12 DIAGNOSIS — Z23 Encounter for immunization: Secondary | ICD-10-CM

## 2016-07-12 DIAGNOSIS — I480 Paroxysmal atrial fibrillation: Secondary | ICD-10-CM

## 2016-07-12 DIAGNOSIS — R7303 Prediabetes: Secondary | ICD-10-CM | POA: Diagnosis not present

## 2016-07-12 DIAGNOSIS — Z0000 Encounter for general adult medical examination without abnormal findings: Secondary | ICD-10-CM

## 2016-07-12 LAB — COAGUCHEK XS/INR WAIVED
INR: 3 — AB (ref 0.9–1.1)
PROTHROMBIN TIME: 36.5 s

## 2016-07-12 LAB — BAYER DCA HB A1C WAIVED: HB A1C (BAYER DCA - WAIVED): 5.7 % (ref <7.0)

## 2016-07-12 NOTE — Patient Instructions (Addendum)
  [name] [name] , Thank you for taking time to come for your Medicare Wellness Visit. I appreciate your ongoing commitment to your health goals. Please review the following plan we discussed and let me know if I can assist you in the future.   These are the goals we discussed: Goal BMI is less than 29.  Would like to see weight decrease by 10 lbs over the next 3 months.   Try to increase activity - Ok to go slowly and increase intensity and length of activity as tolerated.  Goal is to get at least 150 minutes of activity per week. It can be 10 minutes twice a day every day if needed.    Increase non-starchy vegetables - carrots, green bean, squash, zucchini, tomatoes, onions, peppers, spinach and other green leafy vegetables, cabbage, lettuce, cucumbers, asparagus, okra (not fried), eggplant Limit sugar and processed foods (cakes, cookies, ice cream, crackers and chips) Increase fresh fruit but limit serving sizes 1/2 cup or about the size of tennis or baseball Limit red meat to no more than 1-2 times per week (serving size about the size of your palm) Choose whole grains / lean proteins - whole wheat bread, quinoa, whole grain rice (1/2 cup), fish, chicken, [place] Avoid sugar and calorie containing beverages - soda, sweet tea and juice.  Choose water or unsweetened tea instead.    This is a list of the screening recommended for you and due dates:  Health Maintenance  Topic Date Due  . Tetanus Vaccine  12/05/1961  . Pneumonia vaccines (2 of 2 - PPSV23) 07/07/2016  . Flu Shot  09/26/2016  . Colon Cancer Screening  07/18/2020

## 2016-07-12 NOTE — Progress Notes (Signed)
Patient ID: EMITT MAGLIONE, male   DOB: January 18, 1941, 76 y.o.   MRN: [id number]     Subjective:   BHAVESH VAZQUEZ is a 76 y.o. male who presents for a subsequent Medicare Annual Wellness Visit and to recheck INR due to chronic anticoagulation / warfarin therapy.  Social History: Mr. Dagher is married.  He has 2 children - 1 son and 1 daughter.  5 grand children and 6 great grandchildren.   He is retired and use to work at [employer] in [employer] and then [employer]  Current Medications (verified) Outpatient Encounter Prescriptions as of 07/12/2016  Medication Sig  . albuterol (PROVENTIL HFA;VENTOLIN HFA) 108 (90 Base) MCG/ACT inhaler Inhale 2 puffs into the lungs every 6 (six) hours as needed for wheezing or shortness of breath.  Marland Kitchen atorvastatin (LIPITOR) 10 MG tablet TAKE 1 TABLET (10 MG TOTAL) BY MOUTH DAILY.  Marland Kitchen diltiazem (CARDIZEM CD) 360 MG 24 hr capsule TAKE 1 CAPSULE (360 MG TOTAL) BY MOUTH DAILY.  . flecainide (TAMBOCOR) 100 MG tablet TAKE 1 TABLET (100 MG TOTAL) BY MOUTH 2 (TWO) TIMES DAILY.  Marland Kitchen levothyroxine (SYNTHROID, LEVOTHROID) 100 MCG tablet TAKE 1 TABLET (100 MCG TOTAL) BY MOUTH DAILY.  Marland Kitchen lisinopril (PRINIVIL,ZESTRIL) 20 MG tablet TAKE 1 TABLET (20 MG TOTAL) BY MOUTH DAILY.  . metFORMIN (GLUCOPHAGE-XR) 500 MG 24 hr tablet TAKE 1 TABLET (500 MG TOTAL) BY MOUTH DAILY WITH BREAKFAST.  Marland Kitchen warfarin (COUMADIN) 5 MG tablet TAKE 1/2 TO 1 TABLET EVERYDAY PER ANTICOAGULATION CLINIC  . [DISCONTINUED] cefdinir (OMNICEF) 300 MG capsule Take 1 capsule (300 mg total) by mouth 2 (two) times daily. 1 po BID (Patient not taking: Reported on 07/12/2016)  . [DISCONTINUED] Spacer/Aero Chamber Mouthpiece MISC 1 each by Does not apply route every 6 (six) hours as needed. (Patient not taking: Reported on 07/12/2016)   No facility-administered encounter medications on file as of 07/12/2016.     Allergies (verified) Patient has no known allergies.   History: Past Medical History:  Diagnosis Date  . Arthritis   .  Atrial fibrillation ([place])   . Cataract   . Hyperlipidemia   . HYPERTENSION   . HYPOTHYROIDISM   . 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 Pre-diabetes   . Precancerous skin lesion    on back  . ROTATOR CUFF TEAR    Past Surgical History:  Procedure Laterality Date  . APPENDECTOMY    . CATARACT EXTRACTION W/PHACO Right 02/02/2014   Procedure: CATARACT EXTRACTION PHACO AND INTRAOCULAR LENS PLACEMENT (IOC);  Surgeon: Elta Guadeloupe T. Gershon Crane, MD;  Location: [facility];  Service: Ophthalmology;  Laterality: Right;  CDE 12.17  . CATARACT EXTRACTION W/PHACO Left 02/16/2014   Procedure: CATARACT EXTRACTION PHACO AND INTRAOCULAR LENS PLACEMENT ;  Surgeon: Elta Guadeloupe T. Gershon Crane, MD;  Location: [facility];  Service: Ophthalmology;  Laterality: Left;  CDE:13.42  . EYE SURGERY  2015   cataract surgery. bilateral  . JOINT REPLACEMENT  2009   bilateral knee replacement  . KNEE SURGERY    . ROTATOR CUFF REPAIR Left   . SHOULDER SURGERY    . TOTAL KNEE ARTHROPLASTY Bilateral    Family History  Problem Relation Age of Onset  . Heart attack Brother   . Early death Brother   . Asthma Father   . Vision loss Father   . Hypertension Mother   . Heart attack Brother 45  . Arthritis Brother    Social History   Occupational  History  . Not on file.   Social History Main Topics  . Smoking status: Never Smoker  . Smokeless tobacco: Never Used  . Alcohol use No  . Drug use: No  . Sexual activity: No    Do you feel safe at home?  Yes Are there smokers in your home (other than you)? No  Dietary issues and exercise activities discussed: Current Exercise Habits: The patient does not participate in regular exercise at present  Current Dietary habits:  Mr Rappaport does most of food prep at home.   Breakfast - [place] bacon, egg, toast and coffee Lunch - usually small - sandwich Dinner - meat + vegetables.     Cardiac Risk Factors include: advanced age (>44men, >48 women);dyslipidemia;family  history of premature cardiovascular disease;hypertension;male gender;obesity (BMI >30kg/m2);sedentary lifestyle  Objective:    Today's Vitals   07/12/16 1449  BP: 122/62  Pulse: 72  Weight: 280 lb (127 kg)  Height: 5\' 9"  (1.753 m)  PainSc: 0-No pain   Body mass index is 41.35 kg/m.   INR was 3.0 today  A1c = 5.7% today   Activities of Daily Living In your present state of health, do you have any difficulty performing the following activities: 07/12/2016  Hearing? N  Vision? N  Difficulty concentrating or making decisions? N  Walking or climbing stairs? N  Dressing or bathing? N  Doing errands, shopping? N  Preparing Food and eating ? N  Using the Toilet? N  In the past six months, have you accidently leaked urine? N  Do you have problems with loss of bowel control? N  Managing your Medications? N  Managing your Finances? N  Housekeeping or managing your Housekeeping? N  Some recent data might be hidden     Depression Screen PHQ 2/9 Scores 07/12/2016 06/29/2016 06/20/2016 05/22/2016  PHQ - 2 Score 0 0 0 0     Fall Risk Fall Risk  07/12/2016 06/29/2016 06/20/2016 05/22/2016 01/16/2016  Falls in the past year? No No No No No    Cognitive Function: MMSE - Mini Mental State Exam 07/12/2016 04/29/2015  Orientation to time 5 5  Orientation to Place 5 5  Registration 3 3  Attention/ Calculation 5 3  Recall 3 3  Language- name 2 objects 2 2  Language- repeat 1 1  Language- follow 3 step command 3 3  Language- read & follow direction 1 1  Write a sentence 1 1  Copy design 1 1  Total score 30 28    Immunizations and Health Maintenance Immunization History  Administered Date(s) Administered  . Influenza,inj,Quad PF,36+ Mos 12/06/2015  . Influenza-Unspecified 11/28/2014  . Pneumococcal Conjugate-13 07/08/2015   Health Maintenance Due  Topic Date Due  . TETANUS/TDAP  12/05/1961  . PNA vac Low Risk Adult (2 of 2 - PPSV23) 07/07/2016    Patient Care Team: Timmothy Euler, MD as PCP - General (Family Medicine) Rutherford Guys, MD as Consulting Physician (Ophthalmology) Stanford Breed Denice Bors, MD as Consulting Physician (Cardiology) Rogene Houston, MD as Consulting Physician (Gastroenterology) Gaynelle Arabian, MD as Consulting Physician (Orthopedic Surgery) Celene Squibb, MD as Consulting Physician (Dermatology)  Indicate any recent Medical Services you may have received from other than [facility] providers in the past year (date may be approximate).    Assessment:    Annual Wellness Visit  Therapeutic anticoagulation   Screening Tests Health Maintenance  Topic Date Due  . TETANUS/TDAP  12/05/1961  . PNA vac Low Risk Adult (2 of  2 - PPSV23) 07/07/2016  . INFLUENZA VACCINE  09/26/2016  . COLONOSCOPY  07/18/2020        Plan:   During the course of the visit Aarik was educated and counseled about the following appropriate screening and preventive services:   Vaccines to include Pneumoccal, Influenza,  Td, and Shingles - Pneumovax given today.  Declined Td and Shingrix due to dose  Colorectal cancer screening - UTD  Cardiovascular disease screening  Diabetes screening - done today  Glaucoma screening / Diabetic Eye Exam  Nutrition counseling  Advanced Directives  Physical Activity   Anticoagulation Warfarin Dose Instructions as of 07/12/2016      [name] Tue Wed Thu Fri Sat   New Dose 5 mg 2.5 mg 5 mg 2.5 mg 5 mg 2.5 mg 5 mg    Description   Continue warfarin 5mg  - take 1/2 tablet mondays, wednesdays and fridays and 1 tablet all other days.  INR was 3.0 today       Patient Instructions (the written plan) were given to the patient.   Cherre Robins, PharmD   07/12/2016

## 2016-07-15 ENCOUNTER — Other Ambulatory Visit: Payer: Self-pay | Admitting: Family Medicine

## 2016-07-26 ENCOUNTER — Other Ambulatory Visit: Payer: Self-pay | Admitting: Family Medicine

## 2016-08-10 ENCOUNTER — Other Ambulatory Visit: Payer: Self-pay | Admitting: Family Medicine

## 2016-08-17 ENCOUNTER — Ambulatory Visit (INDEPENDENT_AMBULATORY_CARE_PROVIDER_SITE_OTHER): Payer: PPO | Admitting: Pharmacist

## 2016-08-17 DIAGNOSIS — I480 Paroxysmal atrial fibrillation: Secondary | ICD-10-CM | POA: Diagnosis not present

## 2016-08-17 LAB — COAGUCHEK XS/INR WAIVED
INR: 2 — AB (ref 0.9–1.1)
PROTHROMBIN TIME: 23.6 s

## 2016-08-17 NOTE — Patient Instructions (Signed)
Anticoagulation Warfarin Dose Instructions as of 08/17/2016      [name] Tue Wed Thu Fri Sat   New Dose 5 mg 2.5 mg 5 mg 2.5 mg 5 mg 2.5 mg 5 mg    Description   Continue warfarin 5mg  - take 1/2 tablet mondays, wednesdays and fridays and 1 tablet all other days.  INR was 2.0 today (Goal: 2-3)

## 2016-09-18 DIAGNOSIS — B351 Tinea unguium: Secondary | ICD-10-CM | POA: Diagnosis not present

## 2016-09-18 DIAGNOSIS — M79673 Pain in unspecified foot: Secondary | ICD-10-CM | POA: Diagnosis not present

## 2016-09-18 DIAGNOSIS — E1142 Type 2 diabetes mellitus with diabetic polyneuropathy: Secondary | ICD-10-CM | POA: Diagnosis not present

## 2016-09-18 DIAGNOSIS — L84 Corns and callosities: Secondary | ICD-10-CM | POA: Diagnosis not present

## 2016-09-24 ENCOUNTER — Other Ambulatory Visit: Payer: Self-pay | Admitting: Family Medicine

## 2016-09-25 ENCOUNTER — Ambulatory Visit (INDEPENDENT_AMBULATORY_CARE_PROVIDER_SITE_OTHER): Payer: PPO | Admitting: Family Medicine

## 2016-09-25 ENCOUNTER — Encounter: Payer: Self-pay | Admitting: Family Medicine

## 2016-09-25 VITALS — BP 116/62 | HR 57 | Temp 97.2°F | Ht 69.0 in | Wt 285.4 lb

## 2016-09-25 DIAGNOSIS — I10 Essential (primary) hypertension: Secondary | ICD-10-CM | POA: Diagnosis not present

## 2016-09-25 DIAGNOSIS — Z7901 Long term (current) use of anticoagulants: Secondary | ICD-10-CM | POA: Diagnosis not present

## 2016-09-25 DIAGNOSIS — I480 Paroxysmal atrial fibrillation: Secondary | ICD-10-CM

## 2016-09-25 DIAGNOSIS — R7303 Prediabetes: Secondary | ICD-10-CM

## 2016-09-25 LAB — COAGUCHEK XS/INR WAIVED
INR: 2.6 — AB (ref 0.9–1.1)
PROTHROMBIN TIME: 31.7 s

## 2016-09-25 NOTE — Progress Notes (Signed)
   HPI  Patient presents today here for follow up for chronic medical problems.   Patient's lines is a very stressful time at home recently, his grandchildren have had to have their children removed, so now he has 4 great grandchildren at home. He is having difficult time is doing the best he can. He states that he and his wife are making it work, th kids ages oar 79, 41, 23, 5  He has had more dietary indiscretion and disappointed with his weight gain No medication problems.  NO CP, SOB He is breathing better since his bout with CAP earlier this year.   No palps, due for cardiology follow up.   PMH: Smoking status noted ROS: Per HPI  Objective: BP 116/62   Pulse (!) 57   Temp (!) 97.2 F (36.2 C) (Oral)   Ht 5\' 9"  (1.753 m)   Wt 285 lb 6.4 oz (129.5 kg)   BMI 42.15 kg/m  Gen: NAD, alert, cooperative with exam HEENT: NCAT CV: regular rate Resp: CTABL, no wheezes, non-labored Ext: No edema, warm Neuro: Alert and oriented, No gross deficits  Assessment and plan:  # A fib, Long term anticoag use Rate controlled, INR theraputic No changes in dilt or coumadin dosing A;lso on flecainide  # Hypertension Well-controlled on current medications Continue lisinopril  # pre-diabetes Weight up some, discussed Theraputic lifestyle changes, he will work on it again.   He is going through and understandably tough time, mostly offered support.    Laroy Apple, MD [facility] 09/25/2016, 1:18 PM

## 2016-10-24 ENCOUNTER — Other Ambulatory Visit: Payer: Self-pay | Admitting: Family Medicine

## 2016-11-09 ENCOUNTER — Encounter: Payer: PPO | Admitting: Pharmacist Clinician (PhC)/ Clinical Pharmacy Specialist

## 2016-11-16 ENCOUNTER — Ambulatory Visit (INDEPENDENT_AMBULATORY_CARE_PROVIDER_SITE_OTHER): Payer: PPO | Admitting: Pharmacist Clinician (PhC)/ Clinical Pharmacy Specialist

## 2016-11-16 DIAGNOSIS — I480 Paroxysmal atrial fibrillation: Secondary | ICD-10-CM | POA: Diagnosis not present

## 2016-11-16 LAB — COAGUCHEK XS/INR WAIVED
INR: 2 — AB (ref 0.9–1.1)
PROTHROMBIN TIME: 24 s

## 2016-11-16 NOTE — Patient Instructions (Signed)
Anticoagulation Warfarin Dose Instructions as of 11/16/2016      [name] [name] Tue Wed Thu Fri Sat   New Dose 5 mg 2.5 mg 5 mg 2.5 mg 5 mg 2.5 mg 5 mg    Description   Continue warfarin 5mg  - take 1/2 tablet mondays, wednesdays and fridays and 1 tablet all other days.  INR was 2.0 today (Goal: 2-3)

## 2016-12-06 ENCOUNTER — Other Ambulatory Visit: Payer: Self-pay | Admitting: Family Medicine

## 2017-01-04 ENCOUNTER — Ambulatory Visit: Payer: PPO | Admitting: Pharmacist Clinician (PhC)/ Clinical Pharmacy Specialist

## 2017-01-04 DIAGNOSIS — I480 Paroxysmal atrial fibrillation: Secondary | ICD-10-CM

## 2017-01-04 DIAGNOSIS — Z23 Encounter for immunization: Secondary | ICD-10-CM

## 2017-01-04 LAB — COAGUCHEK XS/INR WAIVED
INR: 2.1 — AB (ref 0.9–1.1)
Prothrombin Time: 25.8 s

## 2017-01-04 NOTE — Patient Instructions (Signed)
Description   Continue warfarin 5mg  - take 1/2 tablet mondays, wednesdays and fridays and 1 tablet all other days.  INR was 2.1 today (Goal: 2-3)

## 2017-01-23 ENCOUNTER — Encounter: Payer: Self-pay | Admitting: Physician Assistant

## 2017-01-23 ENCOUNTER — Other Ambulatory Visit: Payer: Self-pay

## 2017-01-23 ENCOUNTER — Inpatient Hospital Stay (HOSPITAL_COMMUNITY)
Admission: EM | Admit: 2017-01-23 | Discharge: 2017-01-25 | DRG: 086 | Disposition: A | Discharge status: Home / Self Care | Payer: PPO | Attending: Internal Medicine | Admitting: Internal Medicine

## 2017-01-23 ENCOUNTER — Emergency Department (HOSPITAL_COMMUNITY): Payer: PPO

## 2017-01-23 ENCOUNTER — Ambulatory Visit: Payer: PPO | Admitting: Physician Assistant

## 2017-01-23 ENCOUNTER — Encounter (HOSPITAL_COMMUNITY): Payer: Self-pay | Admitting: *Deleted

## 2017-01-23 VITALS — BP 139/71 | HR 59 | Temp 96.9°F | Ht 69.0 in | Wt 284.0 lb

## 2017-01-23 DIAGNOSIS — Z96653 Presence of artificial knee joint, bilateral: Secondary | ICD-10-CM | POA: Diagnosis not present

## 2017-01-23 DIAGNOSIS — S065XAA Traumatic subdural hemorrhage with loss of consciousness status unknown, initial encounter: Secondary | ICD-10-CM | POA: Diagnosis present

## 2017-01-23 DIAGNOSIS — S065X9A Traumatic subdural hemorrhage with loss of consciousness of unspecified duration, initial encounter: Secondary | ICD-10-CM | POA: Diagnosis present

## 2017-01-23 DIAGNOSIS — I4891 Unspecified atrial fibrillation: Secondary | ICD-10-CM | POA: Diagnosis present

## 2017-01-23 DIAGNOSIS — G4730 Sleep apnea, unspecified: Secondary | ICD-10-CM | POA: Diagnosis present

## 2017-01-23 DIAGNOSIS — Z961 Presence of intraocular lens: Secondary | ICD-10-CM | POA: Diagnosis not present

## 2017-01-23 DIAGNOSIS — F809 Developmental disorder of speech and language, unspecified: Secondary | ICD-10-CM | POA: Diagnosis not present

## 2017-01-23 DIAGNOSIS — Z9841 Cataract extraction status, right eye: Secondary | ICD-10-CM | POA: Diagnosis not present

## 2017-01-23 DIAGNOSIS — I10 Essential (primary) hypertension: Secondary | ICD-10-CM | POA: Diagnosis not present

## 2017-01-23 DIAGNOSIS — Z79890 Hormone replacement therapy (postmenopausal): Secondary | ICD-10-CM

## 2017-01-23 DIAGNOSIS — E785 Hyperlipidemia, unspecified: Secondary | ICD-10-CM | POA: Diagnosis not present

## 2017-01-23 DIAGNOSIS — Z79899 Other long term (current) drug therapy: Secondary | ICD-10-CM

## 2017-01-23 DIAGNOSIS — H548 Legal blindness, as defined in USA: Secondary | ICD-10-CM | POA: Diagnosis present

## 2017-01-23 DIAGNOSIS — I480 Paroxysmal atrial fibrillation: Secondary | ICD-10-CM | POA: Diagnosis not present

## 2017-01-23 DIAGNOSIS — Z6841 Body Mass Index (BMI) 40.0 and over, adult: Secondary | ICD-10-CM | POA: Diagnosis not present

## 2017-01-23 DIAGNOSIS — X58XXXA Exposure to other specified factors, initial encounter: Secondary | ICD-10-CM | POA: Diagnosis present

## 2017-01-23 DIAGNOSIS — R51 Headache: Secondary | ICD-10-CM | POA: Diagnosis not present

## 2017-01-23 DIAGNOSIS — S065X0A Traumatic subdural hemorrhage without loss of consciousness, initial encounter: Secondary | ICD-10-CM

## 2017-01-23 DIAGNOSIS — Z7901 Long term (current) use of anticoagulants: Secondary | ICD-10-CM

## 2017-01-23 DIAGNOSIS — Z7984 Long term (current) use of oral hypoglycemic drugs: Secondary | ICD-10-CM

## 2017-01-23 DIAGNOSIS — R471 Dysarthria and anarthria: Secondary | ICD-10-CM | POA: Diagnosis present

## 2017-01-23 DIAGNOSIS — E119 Type 2 diabetes mellitus without complications: Secondary | ICD-10-CM | POA: Diagnosis not present

## 2017-01-23 DIAGNOSIS — E118 Type 2 diabetes mellitus with unspecified complications: Secondary | ICD-10-CM | POA: Diagnosis not present

## 2017-01-23 DIAGNOSIS — E039 Hypothyroidism, unspecified: Secondary | ICD-10-CM | POA: Diagnosis present

## 2017-01-23 DIAGNOSIS — R7303 Prediabetes: Secondary | ICD-10-CM | POA: Diagnosis present

## 2017-01-23 DIAGNOSIS — Z9842 Cataract extraction status, left eye: Secondary | ICD-10-CM | POA: Diagnosis not present

## 2017-01-23 DIAGNOSIS — R519 Headache, unspecified: Secondary | ICD-10-CM

## 2017-01-23 DIAGNOSIS — S0990XA Unspecified injury of head, initial encounter: Secondary | ICD-10-CM | POA: Diagnosis not present

## 2017-01-23 DIAGNOSIS — I6200 Nontraumatic subdural hemorrhage, unspecified: Secondary | ICD-10-CM | POA: Diagnosis not present

## 2017-01-23 LAB — CBC WITH DIFFERENTIAL/PLATELET
BASOS ABS: 0 10*3/uL (ref 0.0–0.1)
Basophils Relative: 0 %
EOS ABS: 0 10*3/uL (ref 0.0–0.7)
Eosinophils Relative: 0 %
HCT: 46.5 % (ref 39.0–52.0)
HEMOGLOBIN: 15 g/dL (ref 13.0–17.0)
LYMPHS ABS: 1.1 10*3/uL (ref 0.7–4.0)
LYMPHS PCT: 12 %
MCH: 29.6 pg (ref 26.0–34.0)
MCHC: 32.3 g/dL (ref 30.0–36.0)
MCV: 91.7 fL (ref 78.0–100.0)
Monocytes Absolute: 0.5 10*3/uL (ref 0.1–1.0)
Monocytes Relative: 6 %
NEUTROS PCT: 82 %
Neutro Abs: 8.1 10*3/uL — ABNORMAL HIGH (ref 1.7–7.7)
Platelets: 224 10*3/uL (ref 150–400)
RBC: 5.07 MIL/uL (ref 4.22–5.81)
RDW: 13.7 % (ref 11.5–15.5)
WBC: 9.8 10*3/uL (ref 4.0–10.5)

## 2017-01-23 LAB — COMPREHENSIVE METABOLIC PANEL
ALK PHOS: 59 U/L (ref 38–126)
ALT: 24 U/L (ref 17–63)
AST: 20 U/L (ref 15–41)
Albumin: 4.3 g/dL (ref 3.5–5.0)
Anion gap: 11 (ref 5–15)
BUN: 13 mg/dL (ref 6–20)
CHLORIDE: 104 mmol/L (ref 101–111)
CO2: 24 mmol/L (ref 22–32)
CREATININE: 0.84 mg/dL (ref 0.61–1.24)
Calcium: 9.2 mg/dL (ref 8.9–10.3)
GFR calc non Af Amer: >60 mL/min (ref >60)
GLUCOSE: 133 mg/dL — AB (ref 65–99)
Potassium: 4.7 mmol/L (ref 3.5–5.1)
SODIUM: 139 mmol/L (ref 135–145)
TOTAL PROTEIN: 7.7 g/dL (ref 6.5–8.1)
Total Bilirubin: 0.8 mg/dL (ref 0.3–1.2)

## 2017-01-23 LAB — PROTIME-INR
INR: 2.17
INR: 1.17
PROTHROMBIN TIME: 24 s — AB (ref 11.4–15.2)
Prothrombin Time: 14.8 seconds (ref 11.4–15.2)

## 2017-01-23 LAB — GLUCOSE, CAPILLARY: Glucose-Capillary: 107 mg/dL — ABNORMAL HIGH (ref 65–99)

## 2017-01-23 MED ORDER — METFORMIN HCL ER 500 MG PO TB24
500.0000 mg | ORAL_TABLET | Freq: Every day | ORAL | Status: DC
  Administered 2017-01-24 – 2017-01-25 (×2): 500 mg via ORAL
  Filled 2017-01-23 (×2): qty 1

## 2017-01-23 MED ORDER — FENTANYL CITRATE (PF) 100 MCG/2ML IJ SOLN
25.0000 ug | Freq: Once | INTRAMUSCULAR | Status: AC
  Administered 2017-01-23: 25 ug via INTRAVENOUS

## 2017-01-23 MED ORDER — LEVETIRACETAM IN NACL 500 MG/100ML IV SOLN
500.0000 mg | Freq: Two times a day (BID) | INTRAVENOUS | Status: DC
  Filled 2017-01-23: qty 100

## 2017-01-23 MED ORDER — SODIUM CHLORIDE 0.9 % IV SOLN
500.0000 mg | Freq: Two times a day (BID) | INTRAVENOUS | Status: DC
  Administered 2017-01-23: 500 mg via INTRAVENOUS
  Filled 2017-01-23 (×2): qty 5

## 2017-01-23 MED ORDER — PROTHROMBIN COMPLEX CONC HUMAN 500 UNITS IV KIT
1628.0000 [IU] | PACK | Freq: Once | INTRAVENOUS | Status: AC
  Administered 2017-01-23: 1628 [IU] via INTRAVENOUS
  Filled 2017-01-23: qty 1628

## 2017-01-23 MED ORDER — LEVETIRACETAM IN NACL 500 MG/100ML IV SOLN
500.0000 mg | Freq: Once | INTRAVENOUS | Status: DC
  Filled 2017-01-23: qty 100

## 2017-01-23 MED ORDER — ATORVASTATIN CALCIUM 10 MG PO TABS
10.0000 mg | ORAL_TABLET | Freq: Every day | ORAL | Status: DC
  Administered 2017-01-24 – 2017-01-25 (×2): 10 mg via ORAL
  Filled 2017-01-23 (×2): qty 1

## 2017-01-23 MED ORDER — DEXTROSE 5 % IV SOLN
10.0000 mg | Freq: Once | INTRAVENOUS | Status: AC
  Administered 2017-01-23: 10 mg via INTRAVENOUS
  Filled 2017-01-23: qty 1

## 2017-01-23 MED ORDER — LEVETIRACETAM 500 MG/5ML IV SOLN
500.0000 mg | Freq: Two times a day (BID) | INTRAVENOUS | Status: DC
Start: 2017-01-23 — End: 2017-01-23

## 2017-01-23 MED ORDER — PROTHROMBIN COMPLEX CONC HUMAN 500 UNITS IV KIT
1500.0000 [IU] | PACK | Freq: Once | Status: DC
  Filled 2017-01-23: qty 1500

## 2017-01-23 MED ORDER — SODIUM CHLORIDE 0.9 % IV SOLN: 500.0000 mg | Freq: Two times a day (BID) | INTRAVENOUS | Status: DC

## 2017-01-23 MED ORDER — LEVOTHYROXINE SODIUM 100 MCG PO TABS
100.0000 ug | ORAL_TABLET | Freq: Every day | ORAL | Status: DC
  Administered 2017-01-24 – 2017-01-25 (×2): 100 ug via ORAL
  Filled 2017-01-23 (×2): qty 1

## 2017-01-23 MED ORDER — DILTIAZEM HCL ER COATED BEADS 180 MG PO CP24
360.0000 mg | ORAL_CAPSULE | Freq: Every day | ORAL | Status: DC
  Administered 2017-01-24 – 2017-01-25 (×2): 360 mg via ORAL
  Filled 2017-01-23 (×2): qty 2

## 2017-01-23 MED ORDER — FLECAINIDE ACETATE 100 MG PO TABS
100.0000 mg | ORAL_TABLET | Freq: Two times a day (BID) | ORAL | Status: DC
  Administered 2017-01-23 – 2017-01-25 (×4): 100 mg via ORAL
  Filled 2017-01-23 (×4): qty 1

## 2017-01-23 MED ORDER — FENTANYL CITRATE (PF) 100 MCG/2ML IJ SOLN
25.0000 ug | Freq: Once | INTRAMUSCULAR | Status: AC
  Administered 2017-01-23: 25 ug via INTRAVENOUS
  Filled 2017-01-23: qty 2

## 2017-01-23 MED ORDER — ALBUTEROL SULFATE (2.5 MG/3ML) 0.083% IN NEBU: 3.0000 mL | INHALATION_SOLUTION | Freq: Four times a day (QID) | RESPIRATORY_TRACT | Status: DC | PRN

## 2017-01-23 MED ORDER — LISINOPRIL 20 MG PO TABS
20.0000 mg | ORAL_TABLET | Freq: Every day | ORAL | Status: DC
  Administered 2017-01-24 – 2017-01-25 (×2): 20 mg via ORAL
  Filled 2017-01-23 (×2): qty 1

## 2017-01-23 MED ORDER — FENTANYL CITRATE (PF) 100 MCG/2ML IJ SOLN
INTRAMUSCULAR | Status: AC
  Administered 2017-01-23: 25 ug via INTRAVENOUS
  Filled 2017-01-23: qty 2
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
## 2017-01-23 NOTE — Patient Instructions (Signed)
In a few days you may receive a survey in the mail or online from [employer] regarding your visit with us today. Please take a moment to fill this out. Your feedback is very important to our whole office. It can help us better understand your needs as well as improve your experience and satisfaction. Thank you for taking your time to complete it. We care about you.  Damain Broadus, PA-C  
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
## 2017-01-23 NOTE — H&P (Signed)
History and Physical  [name] [name] KVQ:[id number] DOB: 04-Nov-1940 DOA: 01/23/2017  Referring physician: ER Physician PCP: Timmothy Euler, MD  Outpatient Specialists:    Patient coming from: Home  Chief Complaint: Headache.  HPI: Patient is a 76 year old Caucasian male with past medical history significant for atrial fibrillation on anticoagulation, morbid obesity, likely undiagnosed OSA and OHS, hypertension, hyperlipidemia and documentation of pre diabetes but the patient takes Metformin. The patient was playing with his 60-year old great grand daughter 2 days ago and the great grand daughter's head accidentally hit the patient's chin/facial area. Following the accidental trauma, patient has been experiencing occipital headache/pain, necessitating ER visit today. On presentation to the ER, CT Head done revealed "bilateral supratentorial subdural hematoma, likely acute, with resulting 5 mm rightward midline shift, decrease in size of the lateral ventricles and slight effacement of the basilar cisterns". INR was 2.17. The patient has received IV Vit K and K centra. The ER Physician discussed with the Neurosurgery team, and patient will be started on keppra and transferred to [facility] for further care.  No fever or chills, no SOB, no chest pain, no URI symptoms, no GI or urinary symptoms. Patient snores at night, but has never been worked up for OSA.    ED Course: IV Vit K, K Centra and IV Keppra  Pertinent labs: See INR and CT Head result above EKG:  Imaging: CT Head as reported  Review of Systems:  As in HPI. 12 point review of system was done.   Past Medical History:  Diagnosis Date  . Arthritis   . Atrial fibrillation ([place])   . Cataract   . Hyperlipidemia   . HYPERTENSION   . HYPOTHYROIDISM   . 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 Pre-diabetes   . Precancerous skin lesion    on back  . ROTATOR CUFF TEAR     Past Surgical History:    Procedure Laterality Date  . APPENDECTOMY    . CATARACT EXTRACTION W/PHACO Right 02/02/2014   Procedure: CATARACT EXTRACTION PHACO AND INTRAOCULAR LENS PLACEMENT (IOC);  Surgeon: Elta Guadeloupe T. Gershon Crane, MD;  Location: [facility];  Service: Ophthalmology;  Laterality: Right;  CDE 12.17  . CATARACT EXTRACTION W/PHACO Left 02/16/2014   Procedure: CATARACT EXTRACTION PHACO AND INTRAOCULAR LENS PLACEMENT ;  Surgeon: Elta Guadeloupe T. Gershon Crane, MD;  Location: [facility];  Service: Ophthalmology;  Laterality: Left;  CDE:13.42  . EYE SURGERY  2015   cataract surgery. bilateral  . JOINT REPLACEMENT  2009   bilateral knee replacement  . KNEE SURGERY    . ROTATOR CUFF REPAIR Left   . SHOULDER SURGERY    . TOTAL KNEE ARTHROPLASTY Bilateral      reports that  has never smoked. he has never used smokeless tobacco. He reports that he does not drink alcohol or use drugs.  No Known Allergies  Family History  Problem Relation Age of Onset  . Heart attack Brother   . Early death Brother   . Asthma Father   . Vision loss Father   . Hypertension Mother   . Heart attack Brother 65  . Arthritis Brother      Prior to Admission medications   Medication Sig Start Date End Date Taking? Authorizing Provider  albuterol (PROVENTIL HFA;VENTOLIN HFA) 108 (90 Base) MCG/ACT inhaler Inhale 2 puffs into the lungs every 6 (six) hours as needed for wheezing or shortness of breath. 06/29/16  Yes  Eustaquio Maize, MD  atorvastatin (LIPITOR) 10 MG tablet TAKE 1 TABLET (10 MG TOTAL) BY MOUTH DAILY. 12/07/16  Yes Timmothy Euler, MD  diltiazem (CARDIZEM CD) 360 MG 24 hr capsule TAKE 1 CAPSULE (360 MG TOTAL) BY MOUTH DAILY. 10/25/16  Yes Timmothy Euler, MD  flecainide (TAMBOCOR) 100 MG tablet TAKE 1 TABLET (100 MG TOTAL) BY MOUTH 2 (TWO) TIMES DAILY. 08/10/16  Yes Timmothy Euler, MD  levothyroxine (SYNTHROID, LEVOTHROID) 100 MCG tablet TAKE 1 TABLET (100 MCG TOTAL) BY MOUTH DAILY. 07/26/16  Yes Timmothy Euler, MD  lisinopril  (PRINIVIL,ZESTRIL) 20 MG tablet TAKE 1 TABLET (20 MG TOTAL) BY MOUTH DAILY. 08/10/16  Yes Timmothy Euler, MD  metFORMIN (GLUCOPHAGE-XR) 500 MG 24 hr tablet TAKE 1 TABLET (500 MG TOTAL) BY MOUTH DAILY WITH BREAKFAST. 12/07/16  Yes Timmothy Euler, MD  warfarin (COUMADIN) 5 MG tablet TAKE 1/2 TO 1 TABLET EVERYDAY PER ANTICOAGULATION CLINIC 07/16/16  Yes Eustaquio Maize, MD    Physical Exam: Vitals:   01/23/17 1132 01/23/17 1133  BP: (!) 156/64   Pulse: (!) 55   Resp: 16   Temp: (!) 97.4 F (36.3 C)   TempSrc: Oral   SpO2: 98%   Weight:  128.8 kg (284 lb)  Height:  5\' 9"  (1.753 m)    Constitutional:  . Appears calm and comfortable Eyes:  . No pallor. No jaundice.  ENMT:  . external ears, nose appear normal Neck:  . Neck is supple. No JVD Respiratory:  . CTA bilaterally, no w/r/r.  . Respiratory effort normal. No retractions or accessory muscle use Cardiovascular:  . S1S2 . No LE extremity edema   Abdomen:  . Abdomen is soft and non tender. Organs are difficult to assess. Neurologic:  . Awake and alert. . Moves all limbs.  Wt Readings from Last 3 Encounters:  01/23/17 128.8 kg (284 lb)  01/23/17 128.8 kg (284 lb)  09/25/16 129.5 kg (285 lb 6.4 oz)    I have personally reviewed following labs and imaging studies  Labs on Admission:  CBC: Recent Labs  Lab 01/23/17 1356  WBC 9.8  NEUTROABS 8.1*  HGB 15.0  HCT 46.5  MCV 91.7  PLT 518   Basic Metabolic Panel: Recent Labs  Lab 01/23/17 1356  NA 139  K 4.7  CL 104  CO2 24  GLUCOSE 133*  BUN 13  CREATININE 0.84  CALCIUM 9.2   Liver Function Tests: Recent Labs  Lab 01/23/17 1356  AST 20  ALT 24  ALKPHOS 59  BILITOT 0.8  PROT 7.7  ALBUMIN 4.3   No results for input(s): LIPASE, AMYLASE in the last 168 hours. No results for input(s): AMMONIA in the last 168 hours. Coagulation Profile: Recent Labs  Lab 01/23/17 1356  INR 2.17   Cardiac Enzymes: No results for input(s): CKTOTAL, CKMB,  CKMBINDEX, TROPONINI in the last 168 hours. BNP (last 3 results) No results for input(s): PROBNP in the last 8760 hours. HbA1C: No results for input(s): HGBA1C in the last 72 hours. CBG: No results for input(s): GLUCAP in the last 168 hours. Lipid Profile: No results for input(s): CHOL, HDL, LDLCALC, TRIG, CHOLHDL, LDLDIRECT in the last 72 hours. Thyroid Function Tests: No results for input(s): TSH, T4TOTAL, FREET4, T3FREE, THYROIDAB in the last 72 hours. Anemia Panel: No results for input(s): VITAMINB12, FOLATE, FERRITIN, TIBC, IRON, RETICCTPCT in the last 72 hours. Urine analysis:    Component Value Date/Time   COLORURINE YELLOW 05/11/2010 1145  APPEARANCEUR CLEAR 05/11/2010 1145   LABSPEC 1.020 05/11/2010 1145   PHURINE 7.0 05/11/2010 1145   GLUCOSEU NEGATIVE 05/11/2010 1145   HGBUR NEGATIVE 05/11/2010 1145   BILIRUBINUR NEGATIVE 05/11/2010 1145   KETONESUR NEGATIVE 05/11/2010 1145   PROTEINUR NEGATIVE 05/11/2010 1145   UROBILINOGEN 1.0 05/11/2010 1145   NITRITE NEGATIVE 05/11/2010 1145   LEUKOCYTESUR  05/11/2010 1145    NEGATIVE MICROSCOPIC NOT DONE ON URINES WITH NEGATIVE PROTEIN, BLOOD, LEUKOCYTES, NITRITE, OR GLUCOSE <1000 mg/dL.   Sepsis Labs: @LABRCNTIP (procalcitonin:4,lacticidven:4) )No results found for this or any previous visit (from the past 240 hour(s)).    Radiological Exams on Admission: Ct Head Wo Contrast  Result Date: 01/23/2017 CLINICAL DATA:  Posterior headache for 3 days. EXAM: CT HEAD WITHOUT CONTRAST TECHNIQUE: Contiguous axial images were obtained from the base of the skull through the vertex without intravenous contrast. COMPARISON:  01/01/2011 FINDINGS: Brain: There is a right and left supratentorial subdural hematoma with mixed density, likely acute. The right subdural hematoma thickness measures 15 mm at the frontal convexity. The left sub dural hematoma measures 13 mm at the left frontal convexity. There is a 5 mm right word midline shift. There  is mild diminishing of the basilar cisterns, but no frank subfalcine herniation. There is mild decrease of the size of the lateral ventricles when compared to the prior study. Vascular: Calcific atherosclerotic disease at the skullbase. Skull: Normal. Negative for fracture or focal lesion. Sinuses/Orbits: Mucous retention cysts in the left maxillary sinus. Other: None. IMPRESSION: Bilateral supratentorial subdural hematoma, likely acute, with resulting 5 mm rightward midline shift, decrease in size of the lateral ventricles and slight effacement of the basilar cisterns. These results were called by telephone at the time of interpretation on 01/23/2017 at 2:27 pm to Dr. Julianne Rice , who verbally acknowledged these results. Electronically Signed   By: Fidela Salisbury M.D.   On: 01/23/2017 14:49     Active Problems:   Subdural hematoma ([place]) DM Morbid obesity HTN Hyperlipidemia Likely undiagnosed sleep apnea Atrial fibrillation on anticoagulation.  Assessment/Plan  Subdural hematoma (HCC) DM Morbid obesity HTN Hyperlipidemia Likely undiagnosed sleep apnea Atrial fibrillation on anticoagulation.     Transfer patient to Step down unit at [facility] (Discussed with Dr. Maurene Capes)  Monitor closely  Neuro checks  Consult Neurosurgery  INR in am  Optimize BP and blood sugar check  Outpatient work up for OSA  DVT prophylaxis:SCD Code Status: Full Family Communication: Wife and Grandson Disposition Plan: Transfer patient to Stepdown unit at [facility] called: ER already called NeuroSurgery   Admission status: Inpatient    Time spent: 50 minutes  Dana Allan, MD  [facility] Pager #: [phone] 7PM-7AM contact night coverage as above   01/23/2017, 5:19 PM

## 2017-01-23 NOTE — ED Notes (Signed)
Neuro paged earlier by [facility].  Made a repeat call to Dr. Cyndy Freeze.   Spoke with nurse in the office, who states" Dr. Cyndy Freeze is in surgery, but she had told the PA to call Dr. Lita Mains".  I told her we hadn't heard from anyone.  She sid she was going to put in a page to Dr. Cyndy Freeze.

## 2017-01-24 ENCOUNTER — Inpatient Hospital Stay (HOSPITAL_COMMUNITY): Payer: PPO

## 2017-01-24 DIAGNOSIS — E118 Type 2 diabetes mellitus with unspecified complications: Secondary | ICD-10-CM

## 2017-01-24 DIAGNOSIS — S065X0A Traumatic subdural hemorrhage without loss of consciousness, initial encounter: Principal | ICD-10-CM

## 2017-01-24 DIAGNOSIS — I10 Essential (primary) hypertension: Secondary | ICD-10-CM

## 2017-01-24 LAB — GLUCOSE, CAPILLARY
Glucose-Capillary: 97 mg/dL (ref 65–99)
Glucose-Capillary: 94 mg/dL (ref 65–99)
Glucose-Capillary: 93 mg/dL (ref 65–99)
Glucose-Capillary: 142 mg/dL — ABNORMAL HIGH (ref 65–99)
Glucose-Capillary: 116 mg/dL — ABNORMAL HIGH (ref 65–99)

## 2017-01-24 LAB — HEMOGLOBIN: Hemoglobin: 14.3 g/dL (ref 13.0–17.0)

## 2017-01-24 LAB — PROTIME-INR
INR: 1.08
INR: 1.08
PROTHROMBIN TIME: 13.9 s (ref 11.4–15.2)
Prothrombin Time: 13.9 seconds (ref 11.4–15.2)

## 2017-01-24 LAB — MRSA PCR SCREENING: MRSA by PCR: NEGATIVE

## 2017-01-24 MED ORDER — LEVETIRACETAM 500 MG PO TABS
500.0000 mg | ORAL_TABLET | Freq: Two times a day (BID) | ORAL | Status: DC
  Administered 2017-01-24 – 2017-01-25 (×3): 500 mg via ORAL
  Filled 2017-01-24 (×3): qty 1

## 2017-01-24 MED ORDER — ACETAMINOPHEN-CODEINE #3 300-30 MG PO TABS: 2.0000 | ORAL_TABLET | ORAL | 0 refills | Status: DC | PRN

## 2017-01-24 MED ORDER — LEVETIRACETAM 500 MG PO TABS: 500.0000 mg | ORAL_TABLET | Freq: Two times a day (BID) | ORAL | 0 refills | Status: DC

## 2017-01-24 MED ORDER — BISACODYL 5 MG PO TBEC
5.0000 mg | DELAYED_RELEASE_TABLET | Freq: Every day | ORAL | Status: DC | PRN
  Administered 2017-01-24: 5 mg via ORAL
  Filled 2017-01-24: qty 1

## 2017-01-24 MED ORDER — ACETAMINOPHEN-CODEINE #3 300-30 MG PO TABS
2.0000 | ORAL_TABLET | ORAL | Status: DC | PRN
  Administered 2017-01-24 – 2017-01-25 (×3): 2 via ORAL
  Filled 2017-01-24 (×4): qty 2

## 2017-01-24 MED ORDER — FENTANYL CITRATE (PF) 100 MCG/2ML IJ SOLN: 25.0000 ug | Freq: Once | INTRAMUSCULAR | Status: DC

## 2017-01-24 NOTE — Care Management Note (Addendum)
Case Management Note  Patient Details  Name: [name] [name] MRN: [id number] Date of Birth: 09-Mar-1940  Subjective/Objective:    From home with wife, on coumadin with recent mild TBI (4 days ago).  Having headaches and some slurred speech.  No seizure activity.  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  He has PCP and medication coverage.                   Action/Plan: NCM will follow for dc needs.  Expected Discharge Date:                  Expected Discharge Plan:  Home/Self Care  In-House Referral:     Discharge planning Services  CM Consult  Post Acute Care Choice:    Choice offered to:     DME Arranged:    DME Agency:     HH Arranged:    [place] Agency:     Status of Service:  Completed, signed off  If discussed at [employer] of Stay Meetings, dates discussed:    Additional Comments:  Zenon Mayo, RN 01/24/2017, 4:39 PM

## 2017-01-24 NOTE — H&P (Signed)
CC:  Chief Complaint  Patient presents with  . Headache    HPI: [name] [name] is a 76 y.o. male on coumadin with recent mild TBI (4 days ago).  Having headaches and some slurred speech.  No seizure activity.  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  He was transferred here for observation.  PMH: Past Medical History:  Diagnosis Date  . Arthritis   . Atrial fibrillation ([place])   . Cataract   . Hyperlipidemia   . HYPERTENSION   . HYPOTHYROIDISM   . 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 Pre-diabetes   . Precancerous skin lesion    on back  . ROTATOR CUFF TEAR     PSH: Past Surgical History:  Procedure Laterality Date  . APPENDECTOMY    . CATARACT EXTRACTION W/PHACO Right 02/02/2014   Procedure: CATARACT EXTRACTION PHACO AND INTRAOCULAR LENS PLACEMENT (IOC);  Surgeon: Elta Guadeloupe T. Gershon Crane, MD;  Location: [facility];  Service: Ophthalmology;  Laterality: Right;  CDE 12.17  . CATARACT EXTRACTION W/PHACO Left 02/16/2014   Procedure: CATARACT EXTRACTION PHACO AND INTRAOCULAR LENS PLACEMENT ;  Surgeon: Elta Guadeloupe T. Gershon Crane, MD;  Location: [facility];  Service: Ophthalmology;  Laterality: Left;  CDE:13.42  . EYE SURGERY  2015   cataract surgery. bilateral  . JOINT REPLACEMENT  2009   bilateral knee replacement  . KNEE SURGERY    . ROTATOR CUFF REPAIR Left   . SHOULDER SURGERY    . TOTAL KNEE ARTHROPLASTY Bilateral     SH: Social History   Tobacco Use  . Smoking status: Never Smoker  . Smokeless tobacco: Never Used  Substance Use Topics  . Alcohol use: No  . Drug use: No    MEDS: Prior to Admission medications   Medication Sig Start Date End Date Taking? Authorizing Provider  albuterol (PROVENTIL HFA;VENTOLIN HFA) 108 (90 Base) MCG/ACT inhaler Inhale 2 puffs into the lungs every 6 (six) hours as needed for wheezing or shortness of breath. 06/29/16  Yes Eustaquio Maize, MD  atorvastatin (LIPITOR) 10 MG tablet TAKE 1 TABLET  (10 MG TOTAL) BY MOUTH DAILY. 12/07/16  Yes Timmothy Euler, MD  diltiazem (CARDIZEM CD) 360 MG 24 hr capsule TAKE 1 CAPSULE (360 MG TOTAL) BY MOUTH DAILY. 10/25/16  Yes Timmothy Euler, MD  flecainide (TAMBOCOR) 100 MG tablet TAKE 1 TABLET (100 MG TOTAL) BY MOUTH 2 (TWO) TIMES DAILY. 08/10/16  Yes Timmothy Euler, MD  levothyroxine (SYNTHROID, LEVOTHROID) 100 MCG tablet TAKE 1 TABLET (100 MCG TOTAL) BY MOUTH DAILY. 07/26/16  Yes Timmothy Euler, MD  lisinopril (PRINIVIL,ZESTRIL) 20 MG tablet TAKE 1 TABLET (20 MG TOTAL) BY MOUTH DAILY. 08/10/16  Yes Timmothy Euler, MD  metFORMIN (GLUCOPHAGE-XR) 500 MG 24 hr tablet TAKE 1 TABLET (500 MG TOTAL) BY MOUTH DAILY WITH BREAKFAST. 12/07/16  Yes Timmothy Euler, MD  warfarin (COUMADIN) 5 MG tablet TAKE 1/2 TO 1 TABLET EVERYDAY PER ANTICOAGULATION CLINIC 07/16/16  Yes Eustaquio Maize, MD    ALLERGY: No Known Allergies  ROS: ROS  NEUROLOGIC EXAM: Awake, alert, oriented Memory and concentration grossly intact Speech mildly dysarthric CN grossly intact Motor exam: Upper Extremities Deltoid Bicep Tricep Grip  Right 5/5 5/5 5/5 5/5  Left 5/5 5/5 5/5 5/5   Lower Extremity IP Quad PF DF EHL  Right 5/5 5/5 5/5 5/5 5/5  Left 5/5 5/5 5/5 5/5 5/5   Sensation grossly  intact to LT  IMAGING: Small hemispheric acute on chronic hematomas bilaterally  IMPRESSION: - 76 y.o. 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  Other than dysarthric speech he is neurologically intact.  PLAN: - F/u repeat head CT; d/c if stable - Keppra 500mg  PO bid - Repeat CT in 3 weeks - Do not resume anticoagulation

## 2017-01-24 NOTE — Progress Notes (Signed)
PROGRESS NOTE    [name] [name]   JME:[id number]  DOB: [birth year]  DOA: 01/23/2017 PCP: Timmothy Euler, MD   Brief Narrative:  [name] [name] is a 76 year old Caucasian male with past medical history significant for atrial fibrillation on coumadin, morbid obesity, likely undiagnosed OSA and OHS, hypertension, hyperlipidemia and documentation of pre diabetes on Metformin. The patient was playing with his 8-year old great grand daughter 2 days ago and the great grand daughter's head accidentally hit the patient's chin/facial area. Following the accidental trauma he developed an occipital headache which did not resolve. Found to have b/l subdural hematoma with 51mm right midline shift. INR was 2.17.  He was giving IV Vit K and K centra.     Subjective: Still has an occipital headache which is better when he does not move his head. No new symptoms ROS: no complaints of nausea, vomiting, constipation diarrhea, cough, dyspnea or dysuria. No other complaints.   Assessment & Plan:   Active Problems:   Subdural hematoma (HCC) - in setting of therapeutic INR- now 1.08 - CT scan today reveals mild increase in size and midline shift- see report below - no change in symptoms- will repeat CT tomorrow AM-  - NS recommends repeat CT in 3 wks and Keppra 500 BID   A-fib - curently sinus rhythm - hold coumadin, cont Flecainide, Cardizem and follow on tele  Pre-diabetes - cont Metformin    HTN - Lisinopril  DVT prophylaxis: SCDs Code Status: Full code Family Communication: wife and grandson Disposition Plan: home when stable Consultants:   neurosurgery Procedures:    Antimicrobials:  Anti-infectives (From admission, onward)   None       Objective: Vitals:   01/24/17 1100 01/24/17 1200 01/24/17 1300 01/24/17 1400  BP: (!) 156/77 133/66 121/63 (!) 141/62  Pulse: 61 (!) 53 (!) 52 (!) 57  Resp: 13 15 12 14   Temp:  (!) 96.5 F (35.8 C)    TempSrc:  Axillary    SpO2: 98% 96%  96% 95%  Weight:      Height:        Intake/Output Summary (Last 24 hours) at 01/24/2017 1447 Last data filed at 01/24/2017 1000 Gross per 24 hour  Intake 465 ml  Output -  Net 465 ml   Filed Weights   01/23/17 1133  Weight: 128.8 kg (284 lb)    Examination: General exam: Appears comfortable  HEENT: PERRLA, oral mucosa moist, no sclera icterus or thrush Respiratory system: Clear to auscultation. Respiratory effort normal. Cardiovascular system: S1 & S2 heard, RRR.  No murmurs  Gastrointestinal system: Abdomen soft, non-tender, nondistended. Normal bowel sound. No organomegaly Central nervous system: Alert and oriented. No focal neurological deficits. Extremities: No cyanosis, clubbing or edema Skin: No rashes or ulcers Psychiatry:  Mood & affect appropriate.     Data Reviewed: I have personally reviewed following labs and imaging studies  CBC: Recent Labs  Lab 01/23/17 1356 01/24/17 0501  WBC 9.8  --   NEUTROABS 8.1*  --   HGB 15.0 14.3  HCT 46.5  --   MCV 91.7  --   PLT 224  --    Basic Metabolic Panel: Recent Labs  Lab 01/23/17 1356  NA 139  K 4.7  CL 104  CO2 24  GLUCOSE 133*  BUN 13  CREATININE 0.84  CALCIUM 9.2   GFR: Estimated Creatinine Clearance: 102.5 mL/min (by C-G formula based on SCr of 0.84 mg/dL). Liver Function Tests: Recent  Labs  Lab 01/23/17 1356  AST 20  ALT 24  ALKPHOS 59  BILITOT 0.8  PROT 7.7  ALBUMIN 4.3   No results for input(s): LIPASE, AMYLASE in the last 168 hours. No results for input(s): AMMONIA in the last 168 hours. Coagulation Profile: Recent Labs  Lab 01/23/17 1356 01/23/17 1450 01/24/17 0824  INR 2.17 1.17 1.08   Cardiac Enzymes: No results for input(s): CKTOTAL, CKMB, CKMBINDEX, TROPONINI in the last 168 hours. BNP (last 3 results) No results for input(s): PROBNP in the last 8760 hours. HbA1C: No results for input(s): HGBA1C in the last 72 hours. CBG: Recent Labs  Lab 01/23/17 2255  01/24/17 0351 01/24/17 0758 01/24/17 1228  GLUCAP 107* 97 94 93   Lipid Profile: No results for input(s): CHOL, HDL, LDLCALC, TRIG, CHOLHDL, LDLDIRECT in the last 72 hours. Thyroid Function Tests: No results for input(s): TSH, T4TOTAL, FREET4, T3FREE, THYROIDAB in the last 72 hours. Anemia Panel: No results for input(s): VITAMINB12, FOLATE, FERRITIN, TIBC, IRON, RETICCTPCT in the last 72 hours. Urine analysis:    Component Value Date/Time   COLORURINE YELLOW 05/11/2010 1145   APPEARANCEUR CLEAR 05/11/2010 1145   LABSPEC 1.020 05/11/2010 1145   PHURINE 7.0 05/11/2010 1145   GLUCOSEU NEGATIVE 05/11/2010 1145   HGBUR NEGATIVE 05/11/2010 1145   BILIRUBINUR NEGATIVE 05/11/2010 1145   KETONESUR NEGATIVE 05/11/2010 1145   PROTEINUR NEGATIVE 05/11/2010 1145   UROBILINOGEN 1.0 05/11/2010 1145   NITRITE NEGATIVE 05/11/2010 1145   LEUKOCYTESUR  05/11/2010 1145    NEGATIVE MICROSCOPIC NOT DONE ON URINES WITH NEGATIVE PROTEIN, BLOOD, LEUKOCYTES, NITRITE, OR GLUCOSE <1000 mg/dL.   Sepsis Labs: @LABRCNTIP (procalcitonin:4,lacticidven:4) ) Recent Results (from the past 240 hour(s))  MRSA PCR Screening     Status: None   Collection Time: 01/23/17 10:08 PM  Result Value Ref Range Status   MRSA by PCR NEGATIVE NEGATIVE Final    Comment:        The GeneXpert MRSA Assay (FDA approved for NASAL specimens only), is one component of a comprehensive MRSA colonization surveillance program. It is not intended to diagnose MRSA infection nor to guide or monitor treatment for MRSA infections.          Radiology Studies: Ct Head Wo Contrast  Result Date: 01/24/2017 CLINICAL DATA:  Subdural hemorrhage EXAM: CT HEAD WITHOUT CONTRAST TECHNIQUE: Contiguous axial images were obtained from the base of the skull through the vertex without intravenous contrast. COMPARISON:  01/23/2017 FINDINGS: Brain: Bilateral mixed density subdural hematomas are again noted. Approximately 15 mm in thickness  bilaterally, not significantly changed on the right since prior study. This is slightly larger on the left, compared to 13 mm previously. No hydrocephalus. No intraparenchymal hematoma. Approximately 7 mm left-to-right midline shift, increased from 5 mm previously. Vascular: No hyperdense vessel or unexpected calcification. Skull: No acute calvarial abnormality. Sinuses/Orbits: No acute finding. Other: None IMPRESSION: Bilateral mixed density subdural hematomas, approximately 15 mm, slightly increased on the left since prior study with slightly increased left-to-right midline shift of 7 mm. Electronically Signed   By: Rolm Baptise M.D.   On: 01/24/2017 12:26   Ct Head Wo Contrast  Result Date: 01/23/2017 CLINICAL DATA:  Posterior headache for 3 days. EXAM: CT HEAD WITHOUT CONTRAST TECHNIQUE: Contiguous axial images were obtained from the base of the skull through the vertex without intravenous contrast. COMPARISON:  01/01/2011 FINDINGS: Brain: There is a right and left supratentorial subdural hematoma with mixed density, likely acute. The right subdural hematoma thickness measures 15 mm at  the frontal convexity. The left sub dural hematoma measures 13 mm at the left frontal convexity. There is a 5 mm right word midline shift. There is mild diminishing of the basilar cisterns, but no frank subfalcine herniation. There is mild decrease of the size of the lateral ventricles when compared to the prior study. Vascular: Calcific atherosclerotic disease at the skullbase. Skull: Normal. Negative for fracture or focal lesion. Sinuses/Orbits: Mucous retention cysts in the left maxillary sinus. Other: None. IMPRESSION: Bilateral supratentorial subdural hematoma, likely acute, with resulting 5 mm rightward midline shift, decrease in size of the lateral ventricles and slight effacement of the basilar cisterns. These results were called by telephone at the time of interpretation on 01/23/2017 at 2:27 pm to Dr. Julianne Rice , who verbally acknowledged these results. Electronically Signed   By: Fidela Salisbury M.D.   On: 01/23/2017 14:49      Scheduled Meds: . atorvastatin  10 mg Oral Daily  . diltiazem  360 mg Oral Daily  . fentaNYL (SUBLIMAZE) injection  25 mcg Intravenous Once  . flecainide  100 mg Oral Q12H  . levETIRAcetam  500 mg Oral BID  . levothyroxine  100 mcg Oral Daily  . lisinopril  20 mg Oral Daily  . metFORMIN  500 mg Oral Q breakfast   Continuous Infusions:   LOS: 1 day    Time spent in minutes: 35    Debbe Odea, MD [facility] Pager: [email or web address] Password [facility] 01/24/2017, 2:47 PM

## 2017-01-24 NOTE — Plan of Care (Signed)
Patient is progressing well. He was educated on his current status and expected discharge date. Will continue to monitor and assess.

## 2017-01-25 ENCOUNTER — Other Ambulatory Visit: Payer: Self-pay | Admitting: Neurological Surgery

## 2017-01-25 ENCOUNTER — Inpatient Hospital Stay (HOSPITAL_COMMUNITY): Payer: PPO

## 2017-01-25 DIAGNOSIS — E785 Hyperlipidemia, unspecified: Secondary | ICD-10-CM

## 2017-01-25 DIAGNOSIS — I480 Paroxysmal atrial fibrillation: Secondary | ICD-10-CM

## 2017-01-25 DIAGNOSIS — E039 Hypothyroidism, unspecified: Secondary | ICD-10-CM

## 2017-01-25 DIAGNOSIS — S065X9A Traumatic subdural hemorrhage with loss of consciousness of unspecified duration, initial encounter: Secondary | ICD-10-CM

## 2017-01-25 DIAGNOSIS — S065XAA Traumatic subdural hemorrhage with loss of consciousness status unknown, initial encounter: Secondary | ICD-10-CM

## 2017-01-25 DIAGNOSIS — Z7901 Long term (current) use of anticoagulants: Secondary | ICD-10-CM

## 2017-01-25 DIAGNOSIS — R7303 Prediabetes: Secondary | ICD-10-CM

## 2017-01-25 LAB — GLUCOSE, CAPILLARY: Glucose-Capillary: 94 mg/dL (ref 65–99)

## 2017-01-25 MED ORDER — LEVETIRACETAM 500 MG PO TABS: 500.0000 mg | ORAL_TABLET | Freq: Two times a day (BID) | ORAL | 0 refills | Status: DC

## 2017-01-25 NOTE — Discharge Summary (Signed)
Physician Discharge Summary  [name] [name] FTD:[id number] DOB: 1940-11-20 DOA: 01/23/2017  PCP: Timmothy Euler, MD  Admit date: 01/23/2017 Discharge date: 01/25/2017  Admitted From: home  Disposition:  home   F/u: D/c Keppra       Discharge Condition:  stable   CODE STATUS:  Full code   Consultations:  Neurosurgery- Dr Ditty    Discharge Diagnoses:  Active Problems:   Subdural hematoma (HCC)    Subjective: No headache today. No new symptoms.   Brief Summary: [name] is a 76 year old Caucasian male with past medical history significant for atrial fibrillation on coumadin, morbid obesity, hypertension, hyperlipidemia and documentation of pre diabetes on Metformin. The patient was playing with his 74-year old great grand daughter 2 days ago and the great grand daughter's head accidentally hit the patient's chin/facial area. Following the accidental trauma he developed an occipital headache which did not resolve. Found to have b/l subdural hematoma with 43mm right midline shift. INR was 2.17.  He was giving IV Vit K and K centra.     Hospital Course:  Active Problems:   Subdural hematoma  - in setting of therapeutic INR- improved to 1.08 after reversal - 11/29- CT scan  reveals mild increase in size and midline shift- see report below - no change in symptoms - 11/30 repeat CT is unchanged from yesterday - NS recommends repeat CT in 3 wks and Keppra 500 BID - 50 % chance of requiring burr holes - Tylenol with Codeine added for headache- currently has no headache  Left facial swelling - mild- improving  A-fib - curently sinus rhythm - hold coumadin - cont Flecainide & Cardizem    Pre-diabetes - cont Metformin    HTN - Lisinopril    Discharge Instructions  Discharge Instructions    Diet - low sodium heart healthy   Complete by:  As directed    Increase activity slowly   Complete by:  As directed      Allergies as of 01/25/2017   No Known  Allergies     Medication List    STOP taking these medications   warfarin 5 MG tablet Commonly known as:  COUMADIN     TAKE these medications   acetaminophen-codeine 300-30 MG tablet Commonly known as:  TYLENOL #3 Take 2 tablets by mouth every 4 (four) hours as needed for moderate pain.   albuterol 108 (90 Base) MCG/ACT inhaler Commonly known as:  PROVENTIL HFA;VENTOLIN HFA Inhale 2 puffs into the lungs every 6 (six) hours as needed for wheezing or shortness of breath.   atorvastatin 10 MG tablet Commonly known as:  LIPITOR TAKE 1 TABLET (10 MG TOTAL) BY MOUTH DAILY.   diltiazem 360 MG 24 hr capsule Commonly known as:  CARDIZEM CD TAKE 1 CAPSULE (360 MG TOTAL) BY MOUTH DAILY.   flecainide 100 MG tablet Commonly known as:  TAMBOCOR TAKE 1 TABLET (100 MG TOTAL) BY MOUTH 2 (TWO) TIMES DAILY.   levETIRAcetam 500 MG tablet Commonly known as:  KEPPRA Take 1 tablet (500 mg total) by mouth 2 (two) times daily.   levothyroxine 100 MCG tablet Commonly known as:  SYNTHROID, LEVOTHROID TAKE 1 TABLET (100 MCG TOTAL) BY MOUTH DAILY.   lisinopril 20 MG tablet Commonly known as:  PRINIVIL,ZESTRIL TAKE 1 TABLET (20 MG TOTAL) BY MOUTH DAILY.   metFORMIN 500 MG 24 hr tablet Commonly known as:  GLUCOPHAGE-XR TAKE 1 TABLET (500 MG TOTAL) BY MOUTH DAILY WITH BREAKFAST.  Follow-up Information    Ditty, Kevan Ny, MD Follow up in 3 week(s).   Specialty:  Neurosurgery Contact information: [place] [address] [phone]        Timmothy Euler, MD Follow up in 2 week(s).   Specialty:  Family Medicine Contact information: [place] [address] [phone]          No Known Allergies   Procedures/Studies:    Ct Head Wo Contrast  Result Date: 01/25/2017 CLINICAL DATA:  Subdural hemorrhage.  Occipital headache. EXAM: CT HEAD WITHOUT CONTRAST TECHNIQUE: Contiguous axial images were obtained from the base of the  skull through the vertex without intravenous contrast. COMPARISON:  Head CT scan 01/23/2017 and 01/24/2017. FINDINGS: Brain: Bilateral mixed density subdural hemorrhages are again seen and today measure approximately 1.4 cm on the right and 1.7 cm on the left, not notably changed. Left-to-right midline shift of 0.7 cm is again seen, unchanged. No new hemorrhage is identified. No evidence of acute infarct. Vascular: Atherosclerosis noted. Skull: Intact. Sinuses/Orbits: Small mucous retention cysts or polyps in the maxillary sinuses are noted. Other:  None. IMPRESSION: No change in bilateral subdural hematomas and right to left midline shift since yesterday's examination. No new abnormality. Electronically Signed   By: Inge Rise M.D.   On: 01/25/2017 10:00   Ct Head Wo Contrast  Result Date: 01/24/2017 CLINICAL DATA:  Subdural hemorrhage EXAM: CT HEAD WITHOUT CONTRAST TECHNIQUE: Contiguous axial images were obtained from the base of the skull through the vertex without intravenous contrast. COMPARISON:  01/23/2017 FINDINGS: Brain: Bilateral mixed density subdural hematomas are again noted. Approximately 15 mm in thickness bilaterally, not significantly changed on the right since prior study. This is slightly larger on the left, compared to 13 mm previously. No hydrocephalus. No intraparenchymal hematoma. Approximately 7 mm left-to-right midline shift, increased from 5 mm previously. Vascular: No hyperdense vessel or unexpected calcification. Skull: No acute calvarial abnormality. Sinuses/Orbits: No acute finding. Other: None IMPRESSION: Bilateral mixed density subdural hematomas, approximately 15 mm, slightly increased on the left since prior study with slightly increased left-to-right midline shift of 7 mm. Electronically Signed   By: Rolm Baptise M.D.   On: 01/24/2017 12:26   Ct Head Wo Contrast  Result Date: 01/23/2017 CLINICAL DATA:  Posterior headache for 3 days. EXAM: CT HEAD WITHOUT CONTRAST  TECHNIQUE: Contiguous axial images were obtained from the base of the skull through the vertex without intravenous contrast. COMPARISON:  01/01/2011 FINDINGS: Brain: There is a right and left supratentorial subdural hematoma with mixed density, likely acute. The right subdural hematoma thickness measures 15 mm at the frontal convexity. The left sub dural hematoma measures 13 mm at the left frontal convexity. There is a 5 mm right word midline shift. There is mild diminishing of the basilar cisterns, but no frank subfalcine herniation. There is mild decrease of the size of the lateral ventricles when compared to the prior study. Vascular: Calcific atherosclerotic disease at the skullbase. Skull: Normal. Negative for fracture or focal lesion. Sinuses/Orbits: Mucous retention cysts in the left maxillary sinus. Other: None. IMPRESSION: Bilateral supratentorial subdural hematoma, likely acute, with resulting 5 mm rightward midline shift, decrease in size of the lateral ventricles and slight effacement of the basilar cisterns. These results were called by telephone at the time of interpretation on 01/23/2017 at 2:27 pm to Dr. Julianne Rice , who verbally acknowledged these results. Electronically Signed   By: Fidela Salisbury M.D.   On:  01/23/2017 14:49       Discharge Exam: Vitals:   01/25/17 1100 01/25/17 1140  BP:    Pulse: (!) 56 (!) 52  Resp: 16 17  Temp:  (!) 97.4 F (36.3 C)  SpO2: 96% 96%   Vitals:   01/25/17 0900 01/25/17 1000 01/25/17 1100 01/25/17 1140  BP:  (!) 107/49    Pulse: (!) 52 (!) 55 (!) 56 (!) 52  Resp: 13 13 16 17   Temp:    (!) 97.4 F (36.3 C)  TempSrc:    Oral  SpO2: 94% 94% 96% 96%  Weight:      Height:        General: Pt is alert, awake, not in acute distress Cardiovascular: RRR, S1/S2 +, no rubs, no gallops Respiratory: CTA bilaterally, no wheezing, no rhonchi Abdominal: Soft, NT, ND, bowel sounds + Extremities: no edema, no cyanosis    The results of  significant diagnostics from this hospitalization (including imaging, microbiology, ancillary and laboratory) are listed below for reference.     Microbiology: Recent Results (from the past 240 hour(s))  MRSA PCR Screening     Status: None   Collection Time: 01/23/17 10:08 PM  Result Value Ref Range Status   MRSA by PCR NEGATIVE NEGATIVE Final    Comment:        The GeneXpert MRSA Assay (FDA approved for NASAL specimens only), is one component of a comprehensive MRSA colonization surveillance program. It is not intended to diagnose MRSA infection nor to guide or monitor treatment for MRSA infections.      Labs: BNP (last 3 results) No results for input(s): BNP in the last 8760 hours. Basic Metabolic Panel: Recent Labs  Lab 01/23/17 1356  NA 139  K 4.7  CL 104  CO2 24  GLUCOSE 133*  BUN 13  CREATININE 0.84  CALCIUM 9.2   Liver Function Tests: Recent Labs  Lab 01/23/17 1356  AST 20  ALT 24  ALKPHOS 59  BILITOT 0.8  PROT 7.7  ALBUMIN 4.3   No results for input(s): LIPASE, AMYLASE in the last 168 hours. No results for input(s): AMMONIA in the last 168 hours. CBC: Recent Labs  Lab 01/23/17 1356 01/24/17 0501  WBC 9.8  --   NEUTROABS 8.1*  --   HGB 15.0 14.3  HCT 46.5  --   MCV 91.7  --   PLT 224  --    Cardiac Enzymes: No results for input(s): CKTOTAL, CKMB, CKMBINDEX, TROPONINI in the last 168 hours. BNP: Invalid input(s): POCBNP CBG: Recent Labs  Lab 01/24/17 0758 01/24/17 1228 01/24/17 1545 01/24/17 2110 01/25/17 0759  GLUCAP 94 93 142* 116* 94   D-Dimer No results for input(s): DDIMER in the last 72 hours. Hgb A1c No results for input(s): HGBA1C in the last 72 hours. Lipid Profile No results for input(s): CHOL, HDL, LDLCALC, TRIG, CHOLHDL, LDLDIRECT in the last 72 hours. Thyroid function studies No results for input(s): TSH, T4TOTAL, T3FREE, THYROIDAB in the last 72 hours.  Invalid input(s): FREET3 Anemia work up No results for  input(s): VITAMINB12, FOLATE, FERRITIN, TIBC, IRON, RETICCTPCT in the last 72 hours. Urinalysis    Component Value Date/Time   COLORURINE YELLOW 05/11/2010 1145   APPEARANCEUR CLEAR 05/11/2010 1145   LABSPEC 1.020 05/11/2010 1145   PHURINE 7.0 05/11/2010 1145   GLUCOSEU NEGATIVE 05/11/2010 1145   HGBUR NEGATIVE 05/11/2010 1145   [place] 05/11/2010 1145   [employer] 05/11/2010 1145   [place] 05/11/2010 1145  UROBILINOGEN 1.0 05/11/2010 1145   NITRITE NEGATIVE 05/11/2010 1145   LEUKOCYTESUR  05/11/2010 1145    NEGATIVE MICROSCOPIC NOT DONE ON URINES WITH NEGATIVE PROTEIN, BLOOD, LEUKOCYTES, NITRITE, OR GLUCOSE <1000 mg/dL.   Sepsis Labs Invalid input(s): PROCALCITONIN,  WBC,  LACTICIDVEN Microbiology Recent Results (from the past 240 hour(s))  MRSA PCR Screening     Status: None   Collection Time: 01/23/17 10:08 PM  Result Value Ref Range Status   MRSA by PCR NEGATIVE NEGATIVE Final    Comment:        The GeneXpert MRSA Assay (FDA approved for NASAL specimens only), is one component of a comprehensive MRSA colonization surveillance program. It is not intended to diagnose MRSA infection nor to guide or monitor treatment for MRSA infections.      Time coordinating discharge: Over 30 minutes  SIGNED:   Debbe Odea, MD  [facility] 01/25/2017, 11:47 AM Pager   If 7PM-7AM, please contact night-coverage [email or web address] Password [email or web address]

## 2017-01-25 NOTE — Discharge Instructions (Signed)
Avoid coumadin, aspirin, Ibuprofen and Naproxen.   Please take all your medications with you for your next visit with your Primary MD. Please request your Primary MD to go over all hospital test results at the follow up. Please ask your Primary MD to get all Hospital records sent to his/her office.  If you experience worsening of your admission symptoms, develop shortness of breath, chest pain, suicidal or homicidal thoughts or a life threatening emergency, you must seek medical attention immediately by calling 911 or calling your MD.  Dennis Bast must read the complete instructions/literature along with all the possible adverse reactions/side effects for all the medicines you take including new medications that have been prescribed to you. Take new medicines after you have completely understood and accpet all the possible adverse reactions/side effects.   Do not drive when taking pain medications or sedatives.    Do not take more than prescribed Pain, Sleep and Anxiety Medications  If you have smoked or chewed Tobacco in the last 2 yrs please stop. Stop any regular alcohol and or recreational drug use.  Wear Seat belts while driving.

## 2017-01-25 NOTE — Progress Notes (Signed)
CT yesterday showed hematomas slightly larger No acute events Awake, alert, oriented Speech a little better today Moving arms and legs well, no drift CT today shows stable mixed density extraaxial collections, no enlargement from yesterday Ok to d/c Will order outpatient CT prior to visit in 3 weeks 50/50 he will require surgery, but in 3 weeks these clots should have liquefied so he could have burrholes instead of craniotomies Do not resume coumadin

## 2017-01-25 NOTE — Plan of Care (Signed)
Patient is being discharged from unit. He met his goals and demonstrates understanding on discharged instructions.

## 2017-01-28 ENCOUNTER — Emergency Department (HOSPITAL_COMMUNITY): Payer: PPO

## 2017-01-28 ENCOUNTER — Ambulatory Visit: Payer: PPO | Admitting: Family Medicine

## 2017-01-28 ENCOUNTER — Encounter: Payer: Self-pay | Admitting: Family Medicine

## 2017-01-28 ENCOUNTER — Ambulatory Visit (HOSPITAL_COMMUNITY)
Admission: RE | Admit: 2017-01-28 | Discharge: 2017-01-28 | Disposition: A | Discharge status: Home / Self Care | Payer: PPO | Source: Ambulatory Visit | Attending: Family Medicine | Admitting: Family Medicine

## 2017-01-28 ENCOUNTER — Encounter (HOSPITAL_COMMUNITY): Payer: Self-pay

## 2017-01-28 ENCOUNTER — Other Ambulatory Visit: Payer: Self-pay

## 2017-01-28 ENCOUNTER — Observation Stay (HOSPITAL_COMMUNITY)
Admission: EM | Admit: 2017-01-28 | Discharge: 2017-01-30 | Disposition: A | Discharge status: Home / Self Care | Payer: PPO | Attending: Family Medicine | Admitting: Family Medicine

## 2017-01-28 VITALS — BP 130/71 | HR 53 | Temp 96.8°F | Ht 69.0 in | Wt 283.2 lb

## 2017-01-28 DIAGNOSIS — I10 Essential (primary) hypertension: Secondary | ICD-10-CM | POA: Insufficient documentation

## 2017-01-28 DIAGNOSIS — S065X9A Traumatic subdural hemorrhage with loss of consciousness of unspecified duration, initial encounter: Secondary | ICD-10-CM

## 2017-01-28 DIAGNOSIS — Z7984 Long term (current) use of oral hypoglycemic drugs: Secondary | ICD-10-CM | POA: Insufficient documentation

## 2017-01-28 DIAGNOSIS — R519 Headache, unspecified: Secondary | ICD-10-CM | POA: Diagnosis present

## 2017-01-28 DIAGNOSIS — Z79899 Other long term (current) drug therapy: Secondary | ICD-10-CM | POA: Diagnosis not present

## 2017-01-28 DIAGNOSIS — R4781 Slurred speech: Secondary | ICD-10-CM | POA: Diagnosis not present

## 2017-01-28 DIAGNOSIS — S065XAA Traumatic subdural hemorrhage with loss of consciousness status unknown, initial encounter: Secondary | ICD-10-CM

## 2017-01-28 DIAGNOSIS — I6201 Nontraumatic acute subdural hemorrhage: Secondary | ICD-10-CM | POA: Insufficient documentation

## 2017-01-28 DIAGNOSIS — E785 Hyperlipidemia, unspecified: Secondary | ICD-10-CM | POA: Diagnosis not present

## 2017-01-28 DIAGNOSIS — E039 Hypothyroidism, unspecified: Secondary | ICD-10-CM | POA: Diagnosis present

## 2017-01-28 DIAGNOSIS — R5383 Other fatigue: Secondary | ICD-10-CM

## 2017-01-28 DIAGNOSIS — R51 Headache: Principal | ICD-10-CM | POA: Insufficient documentation

## 2017-01-28 DIAGNOSIS — Z96653 Presence of artificial knee joint, bilateral: Secondary | ICD-10-CM | POA: Diagnosis not present

## 2017-01-28 DIAGNOSIS — I480 Paroxysmal atrial fibrillation: Secondary | ICD-10-CM | POA: Diagnosis not present

## 2017-01-28 DIAGNOSIS — R112 Nausea with vomiting, unspecified: Secondary | ICD-10-CM | POA: Diagnosis not present

## 2017-01-28 LAB — BASIC METABOLIC PANEL
ANION GAP: 8 (ref 5–15)
BUN: 15 mg/dL (ref 6–20)
CALCIUM: 9 mg/dL (ref 8.9–10.3)
CO2: 26 mmol/L (ref 22–32)
Chloride: 102 mmol/L (ref 101–111)
Creatinine, Ser: 0.8 mg/dL (ref 0.61–1.24)
Glucose, Bld: 120 mg/dL — ABNORMAL HIGH (ref 65–99)
POTASSIUM: 4.1 mmol/L (ref 3.5–5.1)
SODIUM: 136 mmol/L (ref 135–145)

## 2017-01-28 LAB — CBC WITH DIFFERENTIAL/PLATELET
BASOS ABS: 0 10*3/uL (ref 0.0–0.1)
BASOS PCT: 0 %
Eosinophils Absolute: 0 10*3/uL (ref 0.0–0.7)
Eosinophils Relative: 0 %
HEMATOCRIT: 44.3 % (ref 39.0–52.0)
Hemoglobin: 14.4 g/dL (ref 13.0–17.0)
LYMPHS PCT: 12 %
Lymphs Abs: 1.3 10*3/uL (ref 0.7–4.0)
MCH: 29.6 pg (ref 26.0–34.0)
MCHC: 32.5 g/dL (ref 30.0–36.0)
MCV: 91.2 fL (ref 78.0–100.0)
MONO ABS: 0.9 10*3/uL (ref 0.1–1.0)
Monocytes Relative: 8 %
NEUTROS ABS: 8.5 10*3/uL — AB (ref 1.7–7.7)
NEUTROS PCT: 80 %
Platelets: 215 10*3/uL (ref 150–400)
RBC: 4.86 MIL/uL (ref 4.22–5.81)
RDW: 13.4 % (ref 11.5–15.5)
WBC: 10.8 10*3/uL — AB (ref 4.0–10.5)

## 2017-01-28 LAB — GLUCOSE, CAPILLARY: GLUCOSE-CAPILLARY: 105 mg/dL — AB (ref 65–99)

## 2017-01-28 LAB — HEPATIC FUNCTION PANEL
ALBUMIN: 4 g/dL (ref 3.5–5.0)
ALT: 31 U/L (ref 17–63)
AST: 19 U/L (ref 15–41)
Alkaline Phosphatase: 56 U/L (ref 38–126)
BILIRUBIN DIRECT: 0.1 mg/dL (ref 0.1–0.5)
Indirect Bilirubin: 0.6 mg/dL (ref 0.3–0.9)
Total Bilirubin: 0.7 mg/dL (ref 0.3–1.2)
Total Protein: 7.6 g/dL (ref 6.5–8.1)

## 2017-01-28 LAB — PROTIME-INR
INR: 1
Prothrombin Time: 13.1 seconds (ref 11.4–15.2)

## 2017-01-28 LAB — TSH: TSH: 2.025 u[IU]/mL (ref 0.350–4.500)

## 2017-01-28 LAB — TROPONIN I

## 2017-01-28 LAB — LIPASE, BLOOD: LIPASE: 32 U/L (ref 11–51)

## 2017-01-28 MED ORDER — ACETAMINOPHEN 650 MG RE SUPP: 650.0000 mg | Freq: Four times a day (QID) | RECTAL | Status: DC | PRN

## 2017-01-28 MED ORDER — SODIUM CHLORIDE 0.9% FLUSH: 3.0000 mL | Freq: Two times a day (BID) | INTRAVENOUS | Status: DC

## 2017-01-28 MED ORDER — LEVETIRACETAM 500 MG PO TABS: 500.0000 mg | ORAL_TABLET | Freq: Two times a day (BID) | ORAL | Status: DC

## 2017-01-28 MED ORDER — ONDANSETRON HCL 4 MG/2ML IJ SOLN: 4.0000 mg | Freq: Four times a day (QID) | INTRAMUSCULAR | Status: DC | PRN

## 2017-01-28 MED ORDER — LEVOTHYROXINE SODIUM 100 MCG PO TABS
100.0000 ug | ORAL_TABLET | Freq: Every day | ORAL | Status: DC
  Administered 2017-01-29 – 2017-01-30 (×2): 100 ug via ORAL
  Filled 2017-01-28 (×2): qty 1

## 2017-01-28 MED ORDER — ACETAMINOPHEN-CODEINE #3 300-30 MG PO TABS
2.0000 | ORAL_TABLET | ORAL | Status: DC | PRN
  Administered 2017-01-28: 2 via ORAL
  Filled 2017-01-28: qty 2

## 2017-01-28 MED ORDER — MORPHINE SULFATE (PF) 2 MG/ML IV SOLN
2.0000 mg | INTRAVENOUS | Status: DC | PRN
  Administered 2017-01-28: 2 mg via INTRAVENOUS
  Filled 2017-01-28: qty 1

## 2017-01-28 MED ORDER — ATORVASTATIN CALCIUM 10 MG PO TABS
10.0000 mg | ORAL_TABLET | Freq: Every day | ORAL | Status: DC
  Administered 2017-01-29: 10 mg via ORAL
  Filled 2017-01-28: qty 1

## 2017-01-28 MED ORDER — FLECAINIDE ACETATE 50 MG PO TABS
100.0000 mg | ORAL_TABLET | Freq: Two times a day (BID) | ORAL | Status: DC
  Administered 2017-01-28 – 2017-01-30 (×4): 100 mg via ORAL
  Filled 2017-01-28 (×4): qty 2

## 2017-01-28 MED ORDER — DILTIAZEM HCL ER COATED BEADS 180 MG PO CP24
360.0000 mg | ORAL_CAPSULE | Freq: Every day | ORAL | Status: DC
  Administered 2017-01-29 – 2017-01-30 (×2): 360 mg via ORAL
  Filled 2017-01-28 (×2): qty 2

## 2017-01-28 MED ORDER — ONDANSETRON HCL 4 MG PO TABS: 4.0000 mg | ORAL_TABLET | Freq: Four times a day (QID) | ORAL | Status: DC | PRN

## 2017-01-28 MED ORDER — ALBUTEROL SULFATE (2.5 MG/3ML) 0.083% IN NEBU: 3.0000 mL | INHALATION_SOLUTION | Freq: Four times a day (QID) | RESPIRATORY_TRACT | Status: DC | PRN

## 2017-01-28 MED ORDER — LACTATED RINGERS IV SOLN
INTRAVENOUS | Status: DC
  Administered 2017-01-28: 19:00:00 via INTRAVENOUS
  Administered 2017-01-29: 50 mL/h via INTRAVENOUS

## 2017-01-28 MED ORDER — DOCUSATE SODIUM 100 MG PO CAPS
100.0000 mg | ORAL_CAPSULE | Freq: Two times a day (BID) | ORAL | Status: DC
  Administered 2017-01-28 – 2017-01-30 (×4): 100 mg via ORAL
  Filled 2017-01-28 (×4): qty 1

## 2017-01-28 MED ORDER — INSULIN ASPART 100 UNIT/ML ~~LOC~~ SOLN
0.0000 [IU] | Freq: Three times a day (TID) | SUBCUTANEOUS | Status: DC
  Administered 2017-01-29: 2 [IU] via SUBCUTANEOUS

## 2017-01-28 MED ORDER — ACETAMINOPHEN 325 MG PO TABS
650.0000 mg | ORAL_TABLET | Freq: Four times a day (QID) | ORAL | Status: DC | PRN
  Administered 2017-01-30: 650 mg via ORAL
  Filled 2017-01-28: qty 2

## 2017-01-28 NOTE — Patient Instructions (Signed)
Great to see you!  We are working on a CT scan today  Come back as needed, or in 1 month for follow up

## 2017-01-28 NOTE — Progress Notes (Signed)
   HPI  Patient presents today for hospital follow-up after subdural hematoma  She states that he is having a severe occipital headache that is very similar to when he previously had the subdural hematoma.  He had complete resolution in the interim and has had worsening symptoms now today.  His grandson is present with him and states that he had slurred speech earlier which has improved slightly.  Patient reports normal vision and no other concerns except for the headache.  He is tolerating food and fluids like usual.  PMH: Smoking status noted ROS: Per HPI  Objective: BP 130/71   Pulse (!) 53   Temp (!) 96.8 F (36 C) (Oral)   Ht 5\' 9"  (1.753 m)   Wt 283 lb 3.2 oz (128.5 kg)   BMI 41.82 kg/m  Gen: NAD, alert, cooperative with exam HEENT: NCAT, EOMI, PERRL CV: RRR, good S1/S2, no murmur Resp: CTABL, no wheezes, non-labored Ext: No edema, warm Neuro: Alert and oriented, strength 5/5 in bilateral lower extremities, 4/5 symmetric in bilateral grip,  Assessment and plan:  # Subdural hematoma Pt with stability on Day 3 of hospitalization, now with worsening symptoms, no objective findings Stat CT INR was reversed- normal before dc RTC as needed or in 67month    Sam Bradshaw, MD [facility] 01/28/2017, 10:57 AM
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
## 2017-01-28 NOTE — ED Triage Notes (Signed)
Pt reports was here recently and diagnosed with cerebral hemorrhage.  Reports went to pcp today for follow up and reports he was c/o worsening headache and vomited today.  Pt was sent here for repeat ct scan.   CT staff brough pt to ER.

## 2017-01-28 NOTE — ED Notes (Signed)
Pt resting in NAD with family at bedside.

## 2017-01-29 DIAGNOSIS — S065X9A Traumatic subdural hemorrhage with loss of consciousness of unspecified duration, initial encounter: Secondary | ICD-10-CM | POA: Diagnosis not present

## 2017-01-29 DIAGNOSIS — R4182 Altered mental status, unspecified: Secondary | ICD-10-CM | POA: Diagnosis not present

## 2017-01-29 DIAGNOSIS — E039 Hypothyroidism, unspecified: Secondary | ICD-10-CM | POA: Diagnosis not present

## 2017-01-29 DIAGNOSIS — I480 Paroxysmal atrial fibrillation: Secondary | ICD-10-CM | POA: Diagnosis not present

## 2017-01-29 DIAGNOSIS — R51 Headache: Secondary | ICD-10-CM | POA: Diagnosis not present

## 2017-01-29 DIAGNOSIS — E785 Hyperlipidemia, unspecified: Secondary | ICD-10-CM | POA: Diagnosis not present

## 2017-01-29 DIAGNOSIS — I10 Essential (primary) hypertension: Secondary | ICD-10-CM | POA: Diagnosis not present

## 2017-01-29 LAB — BASIC METABOLIC PANEL
ANION GAP: 5 (ref 5–15)
BUN: 14 mg/dL (ref 6–20)
CALCIUM: 8.9 mg/dL (ref 8.9–10.3)
CHLORIDE: 101 mmol/L (ref 101–111)
CO2: 30 mmol/L (ref 22–32)
Creatinine, Ser: 0.88 mg/dL (ref 0.61–1.24)
GFR calc non Af Amer: >60 mL/min (ref >60)
GLUCOSE: 104 mg/dL — AB (ref 65–99)
Potassium: 4.7 mmol/L (ref 3.5–5.1)
SODIUM: 136 mmol/L (ref 135–145)

## 2017-01-29 LAB — CBC
HEMATOCRIT: 43.4 % (ref 39.0–52.0)
HEMOGLOBIN: 13.7 g/dL (ref 13.0–17.0)
MCH: 29.3 pg (ref 26.0–34.0)
MCHC: 31.6 g/dL (ref 30.0–36.0)
MCV: 92.9 fL (ref 78.0–100.0)
Platelets: 236 10*3/uL (ref 150–400)
RBC: 4.67 MIL/uL (ref 4.22–5.81)
RDW: 13.7 % (ref 11.5–15.5)
WBC: 10.1 10*3/uL (ref 4.0–10.5)

## 2017-01-29 LAB — GLUCOSE, CAPILLARY
GLUCOSE-CAPILLARY: 99 mg/dL (ref 65–99)
GLUCOSE-CAPILLARY: 121 mg/dL — AB (ref 65–99)

## 2017-01-29 LAB — HEMOGLOBIN A1C
Hgb A1c MFr Bld: 5.9 % — ABNORMAL HIGH (ref 4.8–5.6)
Mean Plasma Glucose: 122.63 mg/dL

## 2017-01-29 NOTE — Care Management Note (Signed)
Case Management Note  Patient Details  Name: [name] [name] MRN: [id number] Date of Birth: 1940/11/02  Subjective/Objective:    Admitted with subdural hematoma Pt is from home, lives with family. He is ind with ADL;s. He has PCP, transportation and insurance with drug coverage. Pt communicates no needs or concerns about DC.                 Action/Plan: Anticipate DC home with self care in next 24/hrs. No CM needs noted at this time.   Expected Discharge Date:     01/29/2017             Expected Discharge Plan:  Home/Self Care  In-House Referral:  NA  Discharge planning Services  NA  Post Acute Care Choice:  NA Choice offered to:  NA  Status of Service:  Completed, signed off  Sherald Barge, RN 01/29/2017, 1:32 PM

## 2017-01-29 NOTE — Consult Note (Signed)
Kemp A. Merlene Laughter, MD     [email or web address]          [name] [name] is an 76 y.o. male.   ASSESSMENT/PLAN: 1.  Keppra induced cognitive impairment:  This medication has subsequently been discontinued with improvement in cognition.  There is no need for antiseizure medication as the patient has not had seizures.  2.  Subacute/acute bifrontal subdural hematoma:  The patient has been taken off anticoagulation.  The mild head injury related to the patient's grandchild is likely not the cause of these hematoma.  Suspect that the hematoma predated the mild head injury.  3.  Headache likely related to subdural hematoma and mild head injury:  Prn Tylenol is recommended.  The headache had improved however.  4. Chronic atrial fibrillation:  Unfortunately, the patient has to be off anticoagulation at least for now.    The patient presented with headaches and some cognitive impairment recently was noted to have bilateral acute/subacute subdural hematoma.  It appears that the patient developed his headaches after a mild head injury due to his granddaughter bumping into the left part of his head in the supraorbital region. Patient was evaluated by Neurosurgery and the surgical intervention was not warranted.  The patient however was placed on Keppra for seizure prophylaxis although he has never had a seizure.  Repeat scan of the head showed no increased.  The patient reports increased headaches in follow-up with his primary care provider who alerted the patient to return back to the hospital for further evaluation especially as a family was concerned that he was not responsive as he usually is, and more drowsy and somewhat confused.  Repeat imaging shows no significant change in the size of the bifrontal hematoma.  Phone consultation was obtained and I recommended that the [place] is the culprit and that this should be discontinued.  Advised was made for the patient to be observed  overnight.  The patient reports that he has improved.  He has been treated with Tylenol for headaches with improvement in his symptoms.  In fact, he reports no headaches tonight.  The drowsiness and confusion have resolved.  He does not report focal deficits such as weakness, dysarthria, dysphagia or numbness.  He reports that he has been up ambulating to the bathroom without difficulties.  The review of systems otherwise negative.  GENERAL:  This is a pleasant obese male who is in no acute distress.  HEENT:  The large neck; supple evidence of a peri orbital bruise on the left which is healing.  ABDOMEN: Soft  EXTREMITIES: No edema   BACK: Normal alignment.  SKIN: Normal by inspection.    MENTAL STATUS: Alert and oriented. Speech, language and cognition are generally intact. Judgment and insight normal.   CRANIAL NERVES: Pupils are equal, round and reactive to light and accommodation; extraocular movements are full, there is no significant nystagmus; upper and lower facial muscles are normal in strength and symmetric, there is no flattening of the nasolabial folds; tongue is midline; uvula is midline; shoulder elevation is normal.  MOTOR: Normal tone, bulk and strength; no pronator drift.  COORDINATION: Left finger to nose is normal, right finger to nose is normal, No rest tremor; no intention tremor; no postural tremor; no bradykinesia.  REFLEXES: Deep tendon reflexes are symmetrical and normal - except at the knees where they are absent status post bilateral total knee arthroplasty. Plantar responses are flexor bilaterally.   SENSATION: Normal to light touch and temperature.  Blood pressure 122/60, pulse 61, temperature 97.9 F (36.6 C), temperature source Oral, resp. rate 17, height 5' 9"  (1.753 m), weight 286 lb (129.7 kg), SpO2 97 %.  Past Medical History:  Diagnosis Date  . Arthritis   . Atrial fibrillation ([place])    previously on Coumadin, reversed with head bleed    . Cataract   . Hyperlipidemia   . HYPERTENSION   . HYPOTHYROIDISM   . 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 Pre-diabetes   . Precancerous skin lesion    on back  . ROTATOR CUFF TEAR     Past Surgical History:  Procedure Laterality Date  . APPENDECTOMY    . CATARACT EXTRACTION W/PHACO Right 02/02/2014   Procedure: CATARACT EXTRACTION PHACO AND INTRAOCULAR LENS PLACEMENT (IOC);  Surgeon: Elta Guadeloupe T. Gershon Crane, MD;  Location: [facility];  Service: Ophthalmology;  Laterality: Right;  CDE 12.17  . CATARACT EXTRACTION W/PHACO Left 02/16/2014   Procedure: CATARACT EXTRACTION PHACO AND INTRAOCULAR LENS PLACEMENT ;  Surgeon: Elta Guadeloupe T. Gershon Crane, MD;  Location: [facility];  Service: Ophthalmology;  Laterality: Left;  CDE:13.42  . EYE SURGERY  2015   cataract surgery. bilateral  . JOINT REPLACEMENT  2009   bilateral knee replacement  . KNEE SURGERY    . ROTATOR CUFF REPAIR Left   . SHOULDER SURGERY    . TOTAL KNEE ARTHROPLASTY Bilateral     Family History  Problem Relation Age of Onset  . Heart attack Brother   . Early death Brother   . Asthma Father   . Vision loss Father   . Hypertension Mother   . Heart attack Brother 18  . Arthritis Brother     Social History:  reports that  has never smoked. he has never used smokeless tobacco. He reports that he does not drink alcohol or use drugs.  Allergies: No Known Allergies  Medications: Prior to Admission medications   Medication Sig Start Date End Date Taking? Authorizing Provider  acetaminophen (TYLENOL) 500 MG tablet Take 1,000 mg by mouth every 6 (six) hours as needed for moderate pain.   Yes [provider]  acetaminophen-codeine (TYLENOL #3) 300-30 MG tablet Take 2 tablets by mouth every 4 (four) hours as needed for moderate pain. 01/24/17  Yes Debbe Odea, MD  albuterol (PROVENTIL HFA;VENTOLIN HFA) 108 (90 Base) MCG/ACT inhaler Inhale 2 puffs into the lungs every 6 (six) hours as needed for wheezing or  shortness of breath. 06/29/16  Yes Eustaquio Maize, MD  atorvastatin (LIPITOR) 10 MG tablet TAKE 1 TABLET (10 MG TOTAL) BY MOUTH DAILY. 12/07/16  Yes Timmothy Euler, MD  diltiazem (CARDIZEM CD) 360 MG 24 hr capsule TAKE 1 CAPSULE (360 MG TOTAL) BY MOUTH DAILY. 10/25/16  Yes Timmothy Euler, MD  flecainide (TAMBOCOR) 100 MG tablet TAKE 1 TABLET (100 MG TOTAL) BY MOUTH 2 (TWO) TIMES DAILY. 08/10/16  Yes Timmothy Euler, MD  levETIRAcetam (KEPPRA) 500 MG tablet Take 1 tablet (500 mg total) by mouth 2 (two) times daily. 01/25/17  Yes Rizwan, Eunice Blase, MD  levothyroxine (SYNTHROID, LEVOTHROID) 100 MCG tablet TAKE 1 TABLET (100 MCG TOTAL) BY MOUTH DAILY. 07/26/16  Yes Timmothy Euler, MD  lisinopril (PRINIVIL,ZESTRIL) 20 MG tablet TAKE 1 TABLET (20 MG TOTAL) BY MOUTH DAILY. 08/10/16  Yes Timmothy Euler, MD  metFORMIN (GLUCOPHAGE-XR) 500 MG 24 hr tablet TAKE 1 TABLET (500 MG TOTAL) BY MOUTH DAILY WITH BREAKFAST. 12/07/16  Yes Kenn File  L, MD    Scheduled Meds: . atorvastatin  10 mg Oral q1800  . diltiazem  360 mg Oral Daily  . docusate sodium  100 mg Oral BID  . flecainide  100 mg Oral Q12H  . insulin aspart  0-15 Units Subcutaneous TID WC  . levothyroxine  100 mcg Oral QAC breakfast  . sodium chloride flush  3 mL Intravenous Q12H   Continuous Infusions: . lactated ringers 50 mL/hr (01/29/17 0500)   PRN Meds:.acetaminophen **OR** acetaminophen, acetaminophen-codeine, albuterol, morphine injection, ondansetron **OR** ondansetron (ZOFRAN) IV     Results for orders placed or performed during the hospital encounter of 01/28/17 (from the past 48 hour(s))  Basic metabolic panel     Status: Abnormal   Collection Time: 01/28/17  2:18 PM  Result Value Ref Range   Sodium 136 135 - 145 mmol/L   Potassium 4.1 3.5 - 5.1 mmol/L   Chloride 102 101 - 111 mmol/L   CO2 26 22 - 32 mmol/L   Glucose, Bld 120 (H) 65 - 99 mg/dL   BUN 15 6 - 20 mg/dL   Creatinine, Ser 0.80 0.61 - 1.24 mg/dL    Calcium 9.0 8.9 - 10.3 mg/dL   GFR calc non Af Amer >60 >60 mL/min   GFR calc Af Amer >60 >60 mL/min    Comment: (NOTE) The eGFR has been calculated using the CKD EPI equation. This calculation has not been validated in all clinical situations. eGFR's persistently <60 mL/min signify possible Chronic Kidney Disease.    Anion gap 8 5 - 15  CBC with Differential     Status: Abnormal   Collection Time: 01/28/17  2:18 PM  Result Value Ref Range   WBC 10.8 (H) 4.0 - 10.5 K/uL   RBC 4.86 4.22 - 5.81 MIL/uL   Hemoglobin 14.4 13.0 - 17.0 g/dL   HCT 44.3 39.0 - 52.0 %   MCV 91.2 78.0 - 100.0 fL   MCH 29.6 26.0 - 34.0 pg   MCHC 32.5 30.0 - 36.0 g/dL   RDW 13.4 11.5 - 15.5 %   Platelets 215 150 - 400 K/uL   Neutrophils Relative % 80 %   Neutro Abs 8.5 (H) 1.7 - 7.7 K/uL   Lymphocytes Relative 12 %   Lymphs Abs 1.3 0.7 - 4.0 K/uL   Monocytes Relative 8 %   Monocytes Absolute 0.9 0.1 - 1.0 K/uL   Eosinophils Relative 0 %   Eosinophils Absolute 0.0 0.0 - 0.7 K/uL   Basophils Relative 0 %   Basophils Absolute 0.0 0.0 - 0.1 K/uL  Protime-INR     Status: None   Collection Time: 01/28/17  2:18 PM  Result Value Ref Range   Prothrombin Time 13.1 11.4 - 15.2 seconds   INR 1.00   Troponin I     Status: None   Collection Time: 01/28/17  2:18 PM  Result Value Ref Range   Troponin I <0.03 <0.03 ng/mL  Lipase, blood     Status: None   Collection Time: 01/28/17  2:25 PM  Result Value Ref Range   Lipase 32 11 - 51 U/L  Hepatic function panel     Status: None   Collection Time: 01/28/17  2:25 PM  Result Value Ref Range   Total Protein 7.6 6.5 - 8.1 g/dL   Albumin 4.0 3.5 - 5.0 g/dL   AST 19 15 - 41 U/L   ALT 31 17 - 63 U/L   Alkaline Phosphatase 56 38 - 126 U/L  Total Bilirubin 0.7 0.3 - 1.2 mg/dL   Bilirubin, Direct 0.1 0.1 - 0.5 mg/dL   Indirect Bilirubin 0.6 0.3 - 0.9 mg/dL  Hemoglobin A1c     Status: Abnormal   Collection Time: 01/28/17  7:04 PM  Result Value Ref Range   Hgb A1c MFr  Bld 5.9 (H) 4.8 - 5.6 %    Comment: (NOTE) Pre diabetes:          5.7%-6.4% Diabetes:              >6.4% Glycemic control for   <7.0% adults with diabetes    Mean Plasma Glucose 122.63 mg/dL    Comment: Performed at [facility] [address] [place], [place] [address]  TSH     Status: None   Collection Time: 01/28/17  7:04 PM  Result Value Ref Range   TSH 2.025 0.350 - 4.500 uIU/mL    Comment: Performed by a 3rd Generation assay with a functional sensitivity of <=0.01 uIU/mL.  Glucose, capillary     Status: Abnormal   Collection Time: 01/28/17  8:51 PM  Result Value Ref Range   Glucose-Capillary 105 (H) 65 - 99 mg/dL   Comment 1 Notify RN    Comment 2 Document in Chart   Basic metabolic panel     Status: Abnormal   Collection Time: 01/29/17  3:53 AM  Result Value Ref Range   Sodium 136 135 - 145 mmol/L   Potassium 4.7 3.5 - 5.1 mmol/L   Chloride 101 101 - 111 mmol/L   CO2 30 22 - 32 mmol/L   Glucose, Bld 104 (H) 65 - 99 mg/dL   BUN 14 6 - 20 mg/dL   Creatinine, Ser 0.88 0.61 - 1.24 mg/dL   Calcium 8.9 8.9 - 10.3 mg/dL   GFR calc non Af Amer >60 >60 mL/min   GFR calc Af Amer >60 >60 mL/min    Comment: (NOTE) The eGFR has been calculated using the CKD EPI equation. This calculation has not been validated in all clinical situations. eGFR's persistently <60 mL/min signify possible Chronic Kidney Disease.    Anion gap 5 5 - 15  CBC     Status: None   Collection Time: 01/29/17  3:53 AM  Result Value Ref Range   WBC 10.1 4.0 - 10.5 K/uL   RBC 4.67 4.22 - 5.81 MIL/uL   Hemoglobin 13.7 13.0 - 17.0 g/dL   HCT 43.4 39.0 - 52.0 %   MCV 92.9 78.0 - 100.0 fL   MCH 29.3 26.0 - 34.0 pg   MCHC 31.6 30.0 - 36.0 g/dL   RDW 13.7 11.5 - 15.5 %   Platelets 236 150 - 400 K/uL  Glucose, capillary     Status: None   Collection Time: 01/29/17  7:42 AM  Result Value Ref Range   Glucose-Capillary 99 65 - 99 mg/dL    Studies/Results:   Head CT scan areReviewed in person.  Initial scan done on 01/24/2007 is compared to the scan done yesterday and overall is unchanged. There appears to be some involution of the hyperintense bilateral subdurals changing into less dense/isodense protractor consistent with normal changes. The subdural on the left may be slightly increased on the right slightly decreased.    Jearlene Bridwell A. Merlene Laughter, M.D.  Diplomate, Tax adviser of Psychiatry and Neurology ( Neurology). 01/29/2017, 6:20 PM

## 2017-01-29 NOTE — Consult Note (Signed)
   [facility] Inpatient Consult   01/29/2017  [name] [name] 02-21-1941 [id number]   Patient screened for potential [facility] services. Patient is on the [facility] registry as a benefit of their [employer] medicare. Spoke with patient at the bedside, patient denies the need for [place] Management services at this time. Patient given [facility] pamphlet with contact information for future reference. [facility] Management services not appropriate at this time.  For questions please contact:   Minetta Krisher RN, [facility] Liaison  [phone]) Business Mobile [phone]) Toll free office

## 2017-01-29 NOTE — Progress Notes (Signed)
PROGRESS NOTE    [name] [name]  TIR:[id number] DOB: Mar 22, 1940 DOA: 01/28/2017 PCP: Timmothy Euler, MD    Brief Narrative:  76 y/o male with history of A fib who was previously on coumadin, but recently suffered head trauma and developed subdural hematoma. He was hospitalized from (11/28-11/30). He was discharged on keppra and plans for neurosurgery follow up. Patient began having worsening headache and came back to ED for evaluation. Repeat CT head did not show any worsening. He was admitted for further evaluation. Keppra discontinued due to possible side effects (headache, somnolence). Awaiting neurology input.   Assessment & Plan:   Principal Problem:   Headache Active Problems:   Hypothyroidism   Essential hypertension   HLD (hyperlipidemia)   Paroxysmal atrial fibrillation (HCC)   Subdural hematoma (HCC)   1. Headache, somnolence, slurred speech. Patient was recently started on keppra after being diagnosed with subdural hematoma. It is felt that his symptoms may be related to keppra and this medication was discontinued in the ED. Overall his symptoms are better today. Continue pain management with tylenol #3 as needed. Neurology has been consulted to see if other antiepileptic is needed or if patient needs any further work up. 2. Subdural hematoma. No change on recent CT scan. Follow up with Dr. Cyndy Freeze as outpatient as previously scheduled. 3. A fib. On carizem and flecainide. No longer candidate for anticoagulation due to #2 4. HTN. Stable on cardizem. Lisinopril held on admission due to borderline bp 5. HLD. Continue statin 6. Hypothyroidism. Continue on synthroid    DVT prophylaxis: SCDs Code Status: full code Family Communication: wife and son at bedside Disposition Plan: Discharge home after being seen by neurology  Consultants:   Neurology  Procedures:     Antimicrobials:      Subjective: Feeling better. Headache resolved. No  vomiting.  Objective: Vitals:   01/28/17 2139 01/29/17 0600 01/29/17 1700 01/29/17 1918  BP: (!) 114/50 118/60 122/60 138/62  Pulse: (!) 56 (!) 57 61 (!) 57  Resp: 18 15 17 18   Temp: 97.8 F (36.6 C) (!) 97.5 F (36.4 C) 97.9 F (36.6 C) 98.2 F (36.8 C)  TempSrc: Oral Oral Oral Oral  SpO2: 97% 96% 97%   Weight:      Height:        Intake/Output Summary (Last 24 hours) at 01/29/2017 2057 Last data filed at 01/29/2017 1700 Gross per 24 hour  Intake 1325.83 ml  Output 1800 ml  Net -474.17 ml   Filed Weights   01/28/17 1356  Weight: 129.7 kg (286 lb)    Examination:  General exam: Appears calm and comfortable  Respiratory system: Clear to auscultation. Respiratory effort normal. Cardiovascular system: S1 & S2 heard, RRR. No JVD, murmurs, rubs, gallops or clicks. No pedal edema. Gastrointestinal system: Abdomen is nondistended, soft and nontender. No organomegaly or masses felt. Normal bowel sounds heard. Central nervous system: Alert and oriented. No focal neurological deficits. Extremities: Symmetric 5 x 5 power. Skin: No rashes, lesions or ulcers Psychiatry: Judgement and insight appear normal. Mood & affect appropriate.     Data Reviewed: I have personally reviewed following labs and imaging studies  CBC: Recent Labs  Lab 01/23/17 1356 01/24/17 0501 01/28/17 1418 01/29/17 0353  WBC 9.8  --  10.8* 10.1  NEUTROABS 8.1*  --  8.5*  --   HGB 15.0 14.3 14.4 13.7  HCT 46.5  --  44.3 43.4  MCV 91.7  --  91.2 92.9  PLT 224  --  215 258   Basic Metabolic Panel: Recent Labs  Lab 01/23/17 1356 01/28/17 1418 01/29/17 0353  NA 139 136 136  K 4.7 4.1 4.7  CL 104 102 101  CO2 24 26 30   GLUCOSE 133* 120* 104*  BUN 13 15 14   CREATININE 0.84 0.80 0.88  CALCIUM 9.2 9.0 8.9   GFR: Estimated Creatinine Clearance: 98.2 mL/min (by C-G formula based on SCr of 0.88 mg/dL). Liver Function Tests: Recent Labs  Lab 01/23/17 1356 01/28/17 1425  AST 20 19  ALT 24 31   ALKPHOS 59 56  BILITOT 0.8 0.7  PROT 7.7 7.6  ALBUMIN 4.3 4.0   Recent Labs  Lab 01/28/17 1425  LIPASE 32   No results for input(s): AMMONIA in the last 168 hours. Coagulation Profile: Recent Labs  Lab 01/23/17 1356 01/23/17 1450 01/24/17 0824 01/24/17 1910 01/28/17 1418  INR 2.17 1.17 1.08 1.08 1.00   Cardiac Enzymes: Recent Labs  Lab 01/28/17 1418  TROPONINI <0.03   BNP (last 3 results) No results for input(s): PROBNP in the last 8760 hours. HbA1C: Recent Labs    01/28/17 1904  HGBA1C 5.9*   CBG: Recent Labs  Lab 01/24/17 2110 01/25/17 0759 01/28/17 2051 01/29/17 0742 01/29/17 1111  GLUCAP 116* 94 105* 99 121*   Lipid Profile: No results for input(s): CHOL, HDL, LDLCALC, TRIG, CHOLHDL, LDLDIRECT in the last 72 hours. Thyroid Function Tests: Recent Labs    01/28/17 1904  TSH 2.025   Anemia Panel: No results for input(s): VITAMINB12, FOLATE, FERRITIN, TIBC, IRON, RETICCTPCT in the last 72 hours. Sepsis Labs: No results for input(s): PROCALCITON, LATICACIDVEN in the last 168 hours.  Recent Results (from the past 240 hour(s))  MRSA PCR Screening     Status: None   Collection Time: 01/23/17 10:08 PM  Result Value Ref Range Status   MRSA by PCR NEGATIVE NEGATIVE Final    Comment:        The GeneXpert MRSA Assay (FDA approved for NASAL specimens only), is one component of a comprehensive MRSA colonization surveillance program. It is not intended to diagnose MRSA infection nor to guide or monitor treatment for MRSA infections.          Radiology Studies: Ct Head Wo Contrast  Result Date: 01/28/2017 CLINICAL DATA:  Posterior headache. Recent subdural hematoma. Follow-up. EXAM: CT HEAD WITHOUT CONTRAST TECHNIQUE: Contiguous axial images were obtained from the base of the skull through the vertex without intravenous contrast. COMPARISON:  01/25/2017 FINDINGS: Brain: When measured in the coronal plane, bilateral mixed density subdural  hematomas are unchanged with thickness of 15-16 mm on each side. The left is larger than the right resulting in left-to-right shift of 6-7 mm. No parenchymal bleeding. No sign of infarction. No hydrocephalus or trapping. Vascular: There is atherosclerotic calcification of the major vessels at the base of the brain. Skull: Normal Sinuses/Orbits: Clear/ normal Other: None IMPRESSION: When measured in the coronal plane, the bilateral mixed density subdural hematomas are unchanged at 15 to 16 mm in thickness. Subdural is slightly larger on the left than the right, with 6-7 mm of left-to-right shift as seen previously. Electronically Signed   By: Nelson Chimes M.D.   On: 01/28/2017 15:05        Scheduled Meds: . atorvastatin  10 mg Oral q1800  . diltiazem  360 mg Oral Daily  . docusate sodium  100 mg Oral BID  . flecainide  100 mg Oral Q12H  . insulin aspart  0-15 Units Subcutaneous  TID WC  . levothyroxine  100 mcg Oral QAC breakfast  . sodium chloride flush  3 mL Intravenous Q12H   Continuous Infusions: . lactated ringers 50 mL/hr (01/29/17 0500)     LOS: 0 days    Time spent: 69mins    Kathie Dike, MD [facility] Pager [phone]  If 7PM-7AM, please contact night-coverage [email or web address] Password [facility] 01/29/2017, 8:57 PM

## 2017-01-29 NOTE — Care Management Obs Status (Signed)
[facility] NOTIFICATION   Patient Details  Name: ANIKET PAYE MRN: [id number] Date of Birth: September 07, 1940   Medicare Observation Status Notification Given:  Yes    Sherald Barge, RN 01/29/2017, 1:17 PM

## 2017-01-30 ENCOUNTER — Encounter (HOSPITAL_COMMUNITY): Payer: Self-pay | Admitting: Family Medicine

## 2017-01-30 DIAGNOSIS — I480 Paroxysmal atrial fibrillation: Secondary | ICD-10-CM

## 2017-01-30 DIAGNOSIS — E039 Hypothyroidism, unspecified: Secondary | ICD-10-CM | POA: Diagnosis not present

## 2017-01-30 DIAGNOSIS — I10 Essential (primary) hypertension: Secondary | ICD-10-CM

## 2017-01-30 DIAGNOSIS — E785 Hyperlipidemia, unspecified: Secondary | ICD-10-CM

## 2017-01-30 DIAGNOSIS — R51 Headache: Secondary | ICD-10-CM | POA: Diagnosis not present

## 2017-01-30 LAB — GLUCOSE, CAPILLARY
GLUCOSE-CAPILLARY: 86 mg/dL (ref 65–99)
Glucose-Capillary: 92 mg/dL (ref 65–99)
Glucose-Capillary: 101 mg/dL — ABNORMAL HIGH (ref 65–99)
Glucose-Capillary: 87 mg/dL (ref 65–99)

## 2017-01-30 NOTE — Discharge Instructions (Signed)
Follow with Primary MD  Timmothy Euler, MD  and other consultant's as instructed your Hospitalist MD  Please get a complete blood count and chemistry panel checked by your Primary MD at your next visit, and again as instructed by your Primary MD.  Get Medicines reviewed and adjusted: Please take all your medications with you for your next visit with your Primary MD  Laboratory/radiological data: Please request your Primary MD to go over all hospital tests and procedure/radiological results at the follow up, please ask your Primary MD to get all Hospital records sent to his/her office.  In some cases, they will be blood work, cultures and biopsy results pending at the time of your discharge. Please request that your primary care M.D. follows up on these results.  Also Note the following: If you experience worsening of your admission symptoms, develop shortness of breath, life threatening emergency, suicidal or homicidal thoughts you must seek medical attention immediately by calling 911 or calling your MD immediately  if symptoms less severe.  You must read complete instructions/literature along with all the possible adverse reactions/side effects for all the Medicines you take and that have been prescribed to you. Take any new Medicines after you have completely understood and accpet all the possible adverse reactions/side effects.   Do not drive when taking Pain medications or sleeping medications (Benzodaizepines)  Do not take more than prescribed Pain, Sleep and Anxiety Medications. It is not advisable to combine anxiety,sleep and pain medications without talking with your primary care practitioner  Special Instructions: If you have smoked or chewed Tobacco  in the last 2 yrs please stop smoking, stop any regular Alcohol  and or any Recreational drug use.  Wear Seat belts while driving.  Please note: You were cared for by a hospitalist during your hospital stay. Once you are  discharged, your primary care physician will handle any further medical issues. Please note that NO REFILLS for any discharge medications will be authorized once you are discharged, as it is imperative that you return to your primary care physician (or establish a relationship with a primary care physician if you do not have one) for your post hospital discharge needs so that they can reassess your need for medications and monitor your lab values.
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
## 2017-01-30 NOTE — Progress Notes (Signed)
[name] discharged Home per MD order.  Discharge instructions reviewed and discussed with the patient, all questions and concerns answered. Copy of instructions given to patient.  Allergies as of 01/30/2017   No Known Allergies     Medication List    STOP taking these medications   levETIRAcetam 500 MG tablet Commonly known as:  KEPPRA     TAKE these medications   acetaminophen 500 MG tablet Commonly known as:  TYLENOL Take 1,000 mg by mouth every 6 (six) hours as needed for moderate pain.   acetaminophen-codeine 300-30 MG tablet Commonly known as:  TYLENOL #3 Take 2 tablets by mouth every 4 (four) hours as needed for moderate pain.   albuterol 108 (90 Base) MCG/ACT inhaler Commonly known as:  PROVENTIL HFA;VENTOLIN HFA Inhale 2 puffs into the lungs every 6 (six) hours as needed for wheezing or shortness of breath.   atorvastatin 10 MG tablet Commonly known as:  LIPITOR TAKE 1 TABLET (10 MG TOTAL) BY MOUTH DAILY.   diltiazem 360 MG 24 hr capsule Commonly known as:  CARDIZEM CD TAKE 1 CAPSULE (360 MG TOTAL) BY MOUTH DAILY.   flecainide 100 MG tablet Commonly known as:  TAMBOCOR TAKE 1 TABLET (100 MG TOTAL) BY MOUTH 2 (TWO) TIMES DAILY.   levothyroxine 100 MCG tablet Commonly known as:  SYNTHROID, LEVOTHROID TAKE 1 TABLET (100 MCG TOTAL) BY MOUTH DAILY.   lisinopril 20 MG tablet Commonly known as:  PRINIVIL,ZESTRIL TAKE 1 TABLET (20 MG TOTAL) BY MOUTH DAILY.   metFORMIN 500 MG 24 hr tablet Commonly known as:  GLUCOPHAGE-XR TAKE 1 TABLET (500 MG TOTAL) BY MOUTH DAILY WITH BREAKFAST.       Patients skin is clean, dry and intact, no evidence of skin break down. Patient escorted to car in a wheelchair,  no distress noted upon discharge.  Ralene Muskrat Ketra Duchesne 01/30/2017 1:06 PM

## 2017-02-04 ENCOUNTER — Encounter (HOSPITAL_COMMUNITY): Payer: Self-pay

## 2017-02-04 ENCOUNTER — Inpatient Hospital Stay (HOSPITAL_COMMUNITY)
Admission: EM | Admit: 2017-02-04 | Discharge: 2017-02-08 | DRG: 939 | Disposition: A | Discharge status: Home Health | Payer: PPO | Attending: Pulmonary Disease | Admitting: Pulmonary Disease

## 2017-02-04 ENCOUNTER — Emergency Department (HOSPITAL_COMMUNITY): Payer: PPO

## 2017-02-04 DIAGNOSIS — F4489 Other dissociative and conversion disorders: Secondary | ICD-10-CM | POA: Diagnosis not present

## 2017-02-04 DIAGNOSIS — R001 Bradycardia, unspecified: Secondary | ICD-10-CM | POA: Diagnosis not present

## 2017-02-04 DIAGNOSIS — R4182 Altered mental status, unspecified: Secondary | ICD-10-CM | POA: Diagnosis not present

## 2017-02-04 DIAGNOSIS — Z79899 Other long term (current) drug therapy: Secondary | ICD-10-CM | POA: Diagnosis not present

## 2017-02-04 DIAGNOSIS — E7439 Other disorders of intestinal carbohydrate absorption: Secondary | ICD-10-CM | POA: Diagnosis present

## 2017-02-04 DIAGNOSIS — S065XAA Traumatic subdural hemorrhage with loss of consciousness status unknown, initial encounter: Secondary | ICD-10-CM

## 2017-02-04 DIAGNOSIS — Z961 Presence of intraocular lens: Secondary | ICD-10-CM | POA: Diagnosis present

## 2017-02-04 DIAGNOSIS — I10 Essential (primary) hypertension: Secondary | ICD-10-CM | POA: Diagnosis present

## 2017-02-04 DIAGNOSIS — I480 Paroxysmal atrial fibrillation: Secondary | ICD-10-CM | POA: Diagnosis not present

## 2017-02-04 DIAGNOSIS — R7303 Prediabetes: Secondary | ICD-10-CM | POA: Diagnosis not present

## 2017-02-04 DIAGNOSIS — Z821 Family history of blindness and visual loss: Secondary | ICD-10-CM

## 2017-02-04 DIAGNOSIS — Z7984 Long term (current) use of oral hypoglycemic drugs: Secondary | ICD-10-CM | POA: Diagnosis not present

## 2017-02-04 DIAGNOSIS — R531 Weakness: Secondary | ICD-10-CM | POA: Diagnosis not present

## 2017-02-04 DIAGNOSIS — H548 Legal blindness, as defined in USA: Secondary | ICD-10-CM | POA: Diagnosis not present

## 2017-02-04 DIAGNOSIS — R918 Other nonspecific abnormal finding of lung field: Secondary | ICD-10-CM | POA: Diagnosis not present

## 2017-02-04 DIAGNOSIS — Z79890 Hormone replacement therapy (postmenopausal): Secondary | ICD-10-CM

## 2017-02-04 DIAGNOSIS — I6200 Nontraumatic subdural hemorrhage, unspecified: Secondary | ICD-10-CM | POA: Diagnosis not present

## 2017-02-04 DIAGNOSIS — Z96653 Presence of artificial knee joint, bilateral: Secondary | ICD-10-CM | POA: Diagnosis present

## 2017-02-04 DIAGNOSIS — Z6841 Body Mass Index (BMI) 40.0 and over, adult: Secondary | ICD-10-CM

## 2017-02-04 DIAGNOSIS — S065X9D Traumatic subdural hemorrhage with loss of consciousness of unspecified duration, subsequent encounter: Secondary | ICD-10-CM | POA: Diagnosis not present

## 2017-02-04 DIAGNOSIS — S065X9A Traumatic subdural hemorrhage with loss of consciousness of unspecified duration, initial encounter: Secondary | ICD-10-CM | POA: Diagnosis present

## 2017-02-04 DIAGNOSIS — K5900 Constipation, unspecified: Secondary | ICD-10-CM | POA: Diagnosis not present

## 2017-02-04 DIAGNOSIS — E785 Hyperlipidemia, unspecified: Secondary | ICD-10-CM | POA: Diagnosis not present

## 2017-02-04 DIAGNOSIS — E039 Hypothyroidism, unspecified: Secondary | ICD-10-CM | POA: Diagnosis present

## 2017-02-04 DIAGNOSIS — R400 Somnolence: Secondary | ICD-10-CM

## 2017-02-04 DIAGNOSIS — S065X0A Traumatic subdural hemorrhage without loss of consciousness, initial encounter: Secondary | ICD-10-CM | POA: Diagnosis not present

## 2017-02-04 DIAGNOSIS — Z789 Other specified health status: Secondary | ICD-10-CM

## 2017-02-04 DIAGNOSIS — D72829 Elevated white blood cell count, unspecified: Secondary | ICD-10-CM | POA: Diagnosis not present

## 2017-02-04 DIAGNOSIS — E871 Hypo-osmolality and hyponatremia: Secondary | ICD-10-CM | POA: Diagnosis not present

## 2017-02-04 DIAGNOSIS — J9811 Atelectasis: Secondary | ICD-10-CM | POA: Diagnosis not present

## 2017-02-04 DIAGNOSIS — Z8249 Family history of ischemic heart disease and other diseases of the circulatory system: Secondary | ICD-10-CM

## 2017-02-04 DIAGNOSIS — R404 Transient alteration of awareness: Secondary | ICD-10-CM | POA: Diagnosis not present

## 2017-02-04 DIAGNOSIS — X58XXXD Exposure to other specified factors, subsequent encounter: Secondary | ICD-10-CM | POA: Diagnosis present

## 2017-02-04 DIAGNOSIS — G9341 Metabolic encephalopathy: Secondary | ICD-10-CM | POA: Diagnosis present

## 2017-02-04 DIAGNOSIS — I6203 Nontraumatic chronic subdural hemorrhage: Secondary | ICD-10-CM | POA: Diagnosis not present

## 2017-02-04 DIAGNOSIS — I6202 Nontraumatic subacute subdural hemorrhage: Secondary | ICD-10-CM | POA: Diagnosis not present

## 2017-02-04 HISTORY — DX: Traumatic subdural hemorrhage with loss of consciousness of unspecified duration, initial encounter: S06.5X9A

## 2017-02-04 HISTORY — DX: Traumatic subdural hemorrhage with loss of consciousness status unknown, initial encounter: S06.5XAA

## 2017-02-04 LAB — COMPREHENSIVE METABOLIC PANEL
ALBUMIN: 3.7 g/dL (ref 3.5–5.0)
ALT: 26 U/L (ref 17–63)
AST: 19 U/L (ref 15–41)
Alkaline Phosphatase: 58 U/L (ref 38–126)
Anion gap: 11 (ref 5–15)
BUN: 15 mg/dL (ref 6–20)
CALCIUM: 9.1 mg/dL (ref 8.9–10.3)
CHLORIDE: 99 mmol/L — AB (ref 101–111)
CO2: 24 mmol/L (ref 22–32)
Creatinine, Ser: 0.77 mg/dL (ref 0.61–1.24)
GFR calc Af Amer: >60 mL/min (ref >60)
GLUCOSE: 118 mg/dL — AB (ref 65–99)
POTASSIUM: 3.7 mmol/L (ref 3.5–5.1)
Sodium: 134 mmol/L — ABNORMAL LOW (ref 135–145)
TOTAL PROTEIN: 7.7 g/dL (ref 6.5–8.1)
Total Bilirubin: 0.5 mg/dL (ref 0.3–1.2)

## 2017-02-04 LAB — CBC WITH DIFFERENTIAL/PLATELET
BASOS ABS: 0 10*3/uL (ref 0.0–0.1)
BASOS PCT: 0 %
EOS PCT: 1 %
Eosinophils Absolute: 0.1 10*3/uL (ref 0.0–0.7)
HEMATOCRIT: 45 % (ref 39.0–52.0)
Hemoglobin: 15 g/dL (ref 13.0–17.0)
Lymphocytes Relative: 17 %
Lymphs Abs: 1.9 10*3/uL (ref 0.7–4.0)
MCH: 30.1 pg (ref 26.0–34.0)
MCHC: 33.3 g/dL (ref 30.0–36.0)
MCV: 90.2 fL (ref 78.0–100.0)
Monocytes Absolute: 1.1 10*3/uL — ABNORMAL HIGH (ref 0.1–1.0)
Monocytes Relative: 11 %
NEUTROS PCT: 71 %
Neutro Abs: 7.7 10*3/uL (ref 1.7–7.7)
PLATELETS: 242 10*3/uL (ref 150–400)
RBC: 4.99 MIL/uL (ref 4.22–5.81)
RDW: 13 % (ref 11.5–15.5)
WBC: 10.8 10*3/uL — ABNORMAL HIGH (ref 4.0–10.5)

## 2017-02-04 LAB — TSH: TSH: 1.302 u[IU]/mL (ref 0.350–4.500)

## 2017-02-04 LAB — PROTIME-INR
INR: 0.99
Prothrombin Time: 13 seconds (ref 11.4–15.2)

## 2017-02-04 LAB — TROPONIN I: Troponin I: <0.03 ng/mL (ref <0.03)

## 2017-02-04 MED ORDER — ACETAMINOPHEN 325 MG PO TABS
650.0000 mg | ORAL_TABLET | Freq: Once | ORAL | Status: AC
  Administered 2017-02-04: 650 mg via ORAL
  Filled 2017-02-04: qty 2

## 2017-02-04 NOTE — ED Provider Notes (Signed)
[facility] EMERGENCY DEPARTMENT Provider Note   CSN: [id number] Arrival date & time: 02/04/17  1451     History   Chief Complaint Chief Complaint  Patient presents with  . Fatigue    HPI [name] [name] is a 76 y.o. male.  Patient from home with 3 day of progressive fatigue, generalized weakness, poor PO intake, and decreased activity.  Discharged from hospital on 12/5 for similar symptoms. Two recent admission for SDH. CT has been stable. Coumadin has been stopped. Keppra was also stopped as thought to be contributing to fatigue and patient was not having seizures.  Patient states he feels fine and denies headache. No vomiting. States he has been eating. No focal weakness, numbness, tingling. No chest pain or shortness of breath. No visual changes.   The history is provided by the patient, the EMS personnel and a relative.    Past Medical History:  Diagnosis Date  . Arthritis   . Atrial fibrillation ([place])    previously on Coumadin, reversed with head bleed  . Cataract   . Hyperlipidemia   . HYPERTENSION   . HYPOTHYROIDISM   . 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 Pre-diabetes   . Precancerous skin lesion    on back  . ROTATOR CUFF TEAR   . Subdural hematoma [facility])     Patient Active Problem List   Diagnosis Date Noted  . Headache 01/28/2017  . Subdural hematoma ([place]) 01/23/2017  . Pre-diabetes 09/14/2015  . Paroxysmal atrial fibrillation ([place]) 03/30/2015  . HLD (hyperlipidemia) 03/02/2015  . Healthcare maintenance 03/02/2015  . Long term current use of anticoagulant 03/30/2014  . Hypothyroidism 06/26/2008  . Essential hypertension 06/26/2008  . ROTATOR CUFF TEAR 06/26/2008  . DYSPNEA 06/26/2008    Past Surgical History:  Procedure Laterality Date  . APPENDECTOMY    . CATARACT EXTRACTION W/PHACO Right 02/02/2014   Procedure: CATARACT EXTRACTION PHACO AND INTRAOCULAR LENS PLACEMENT (IOC);  Surgeon: Elta Guadeloupe T. Gershon Crane, MD;  Location: [facility]  ORS;  Service: Ophthalmology;  Laterality: Right;  CDE 12.17  . CATARACT EXTRACTION W/PHACO Left 02/16/2014   Procedure: CATARACT EXTRACTION PHACO AND INTRAOCULAR LENS PLACEMENT ;  Surgeon: Elta Guadeloupe T. Gershon Crane, MD;  Location: [facility] ORS;  Service: Ophthalmology;  Laterality: Left;  CDE:13.42  . EYE SURGERY  2015   cataract surgery. bilateral  . JOINT REPLACEMENT  2009   bilateral knee replacement  . KNEE SURGERY    . ROTATOR CUFF REPAIR Left   . SHOULDER SURGERY    . TOTAL KNEE ARTHROPLASTY Bilateral        Home Medications    Prior to Admission medications   Medication Sig Start Date End Date Taking? Authorizing Provider  acetaminophen (TYLENOL) 500 MG tablet Take 1,000 mg by mouth every 6 (six) hours as needed for moderate pain.    [provider]  acetaminophen-codeine (TYLENOL #3) 300-30 MG tablet Take 2 tablets by mouth every 4 (four) hours as needed for moderate pain. 01/24/17   Debbe Odea, MD  albuterol (PROVENTIL HFA;VENTOLIN HFA) 108 (90 Base) MCG/ACT inhaler Inhale 2 puffs into the lungs every 6 (six) hours as needed for wheezing or shortness of breath. 06/29/16   Eustaquio Maize, MD  atorvastatin (LIPITOR) 10 MG tablet TAKE 1 TABLET (10 MG TOTAL) BY MOUTH DAILY. 12/07/16   Timmothy Euler, MD  diltiazem (CARDIZEM CD) 360 MG 24 hr capsule TAKE 1 CAPSULE (360 MG TOTAL) BY MOUTH DAILY. 10/25/16  Timmothy Euler, MD  flecainide (TAMBOCOR) 100 MG tablet TAKE 1 TABLET (100 MG TOTAL) BY MOUTH 2 (TWO) TIMES DAILY. 08/10/16   Timmothy Euler, MD  levothyroxine (SYNTHROID, LEVOTHROID) 100 MCG tablet TAKE 1 TABLET (100 MCG TOTAL) BY MOUTH DAILY. 07/26/16   Timmothy Euler, MD  lisinopril (PRINIVIL,ZESTRIL) 20 MG tablet TAKE 1 TABLET (20 MG TOTAL) BY MOUTH DAILY. 08/10/16   Timmothy Euler, MD  metFORMIN (GLUCOPHAGE-XR) 500 MG 24 hr tablet TAKE 1 TABLET (500 MG TOTAL) BY MOUTH DAILY WITH BREAKFAST. 12/07/16   Timmothy Euler, MD    Family History Family History    Problem Relation Age of Onset  . Heart attack Brother   . Early death Brother   . Asthma Father   . Vision loss Father   . Hypertension Mother   . Heart attack Brother 8  . Arthritis Brother     Social History Social History   Tobacco Use  . Smoking status: Never Smoker  . Smokeless tobacco: Never Used  Substance Use Topics  . 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 Drug use: No    Comment: remote h/o marijuana use     Allergies   Patient has no known allergies.   Review of Systems Review of Systems  Constitutional: Positive for activity change, appetite change and fatigue. Negative for fever.  HENT: Negative for congestion and rhinorrhea.   Eyes: Negative for visual disturbance.  Respiratory: Negative for cough, chest tightness and shortness of breath.   Gastrointestinal: Negative for abdominal pain, nausea and vomiting.  Genitourinary: Negative for dysuria, hematuria and testicular pain.  Musculoskeletal: Negative for arthralgias and myalgias.  Skin: Negative for rash.  Neurological: Positive for weakness. Negative for dizziness, seizures, light-headedness, numbness and headaches.    all other systems are negative except as noted in the HPI and PMH.   Physical Exam Updated Vital Signs BP 134/72 (BP Location: Left Arm)   Pulse 61   Temp 98.1 F (36.7 C) (Oral)   Resp (!) 21   Wt 129.7 kg (286 lb)   SpO2 99%   BMI 42.23 kg/m   Physical Exam  Constitutional: He is oriented to person, place, and time. He appears well-developed and well-nourished. No distress.  HENT:  Head: Normocephalic and atraumatic.  Mouth/Throat: Oropharynx is clear and moist. No oropharyngeal exudate.  Eyes: Conjunctivae and EOM are normal. Pupils are equal, round, and reactive to light.  Neck: Normal range of motion. Neck supple.  No meningismus.  Cardiovascular: Normal rate, regular rhythm, normal heart sounds and  intact distal pulses.  No murmur heard. Pulmonary/Chest: Effort normal and breath sounds normal. No respiratory distress.  Abdominal: Soft. There is no tenderness. There is no rebound and no guarding.  Musculoskeletal: Normal range of motion. He exhibits no edema or tenderness.  Neurological: He is alert and oriented to person, place, and time. No cranial nerve deficit. He exhibits normal muscle tone. Coordination normal.  Oriented to person and place. Thinks it is "December 12".  No ataxia on finger to nose bilaterally. No pronator drift. 5/5 strength throughout. CN 2-12 intact.Equal grip strength. Sensation intact.   Skin: Skin is warm.  Psychiatric: He has a normal mood and affect. His behavior is normal.  Nursing note and vitals reviewed.    ED Treatments / Results  Labs (all labs ordered are listed, but only abnormal results are displayed)  Labs Reviewed  CBC WITH DIFFERENTIAL/PLATELET - Abnormal; Notable for the following components:      Result Value   WBC 10.8 (*)    Monocytes Absolute 1.1 (*)    All other components within normal limits  COMPREHENSIVE METABOLIC PANEL - Abnormal; Notable for the following components:   Sodium 134 (*)    Chloride 99 (*)    Glucose, Bld 118 (*)    All other components within normal limits  TROPONIN I  PROTIME-INR  TSH  URINALYSIS, ROUTINE W REFLEX MICROSCOPIC    EKG  EKG Interpretation  Date/Time:  Monday February 04 2017 15:14:49 EST Ventricular Rate:  54 PR Interval:    QRS Duration: 115 QT Interval:  462 QTC Calculation: 438 R Axis:   33 Text Interpretation:  Sinus rhythm Borderline prolonged PR interval Nonspecific intraventricular conduction delay Baseline wander in lead(s) V5 No significant change was found Confirmed by Ezequiel Essex [phone]) on 02/04/2017 3:19:47 PM       Radiology Dg Chest 2 View  Result Date: 02/04/2017 CLINICAL DATA:  Patient with altered mental status. Increasing lethargy. EXAM: CHEST  2 VIEW  COMPARISON:  Chest radiograph 06/20/2016 FINDINGS: Monitoring leads overlie the patient. Stable cardiac and mediastinal contours. Low lung volumes. Bibasilar heterogeneous pulmonary opacities. No pleural effusion or pneumothorax. Thoracic spine degenerative changes. IMPRESSION: Low lung volumes with basilar opacities favored to represent atelectasis. Infection not excluded. Electronically Signed   By: Lovey Newcomer M.D.   On: 02/04/2017 15:46   Ct Head Wo Contrast  Result Date: 02/04/2017 CLINICAL DATA:  Altered mental status. Increased lethargy for 3 days. Recent subdural hematoma. EXAM: CT HEAD WITHOUT CONTRAST TECHNIQUE: Contiguous axial images were obtained from the base of the skull through the vertex without intravenous contrast. COMPARISON:  Head CT 01/28/2017 FINDINGS: Brain: There bilateral mixed density subdural hematomas. The right subdural hematoma is unchanged in size measuring approximately 18 mm. The left subdural hematoma is increased and measures 24 mm, previously 19 mm. There are areas of hyperattenuation that are suggestive of a more recent hemorrhagic event. Midline shift is worsened and now measures 9 mm to the right, previously 7 mm, at the level of the foramina of [place]. The mass effect on the ventricles is also worsened. No hydrocephalus or ventricular trapping. The basal cisterns remain patent. No intraparenchymal hemorrhage. Vascular: No hyperdense vessel or unexpected calcification. Skull: Normal. Negative for fracture or focal lesion. Sinuses/Orbits: Trace amount of bilateral mastoid fluid. Normal orbits. Other: None. IMPRESSION: 1. Increased size of left convexity subdural hematoma, with likely subacute hemorrhage in the interval since 01/28/2017, now measuring up to 24 mm, previously 19 mm. Worsening rightward midline shift now measures 9 mm. 2. Unchanged right convexity subdural hematoma. 3. Worsened mass effect on the left lateral ventricle. No hydrocephalus currently, but patient  at risk for developing entrapment of the left temporal horn. Critical Value/emergent results were called by telephone at the time of interpretation on 02/04/2017 at 3:46 pm to Dr. Ezequiel Essex , who verbally acknowledged these results. Electronically Signed   By: Ulyses Jarred M.D.   On: 02/04/2017 15:48    Procedures Procedures (including critical care time)  Medications Ordered in ED Medications - No data to display   Initial Impression / Assessment and Plan / ED Course  I have reviewed the triage vital signs and the nursing notes.  Pertinent labs & imaging results that were available during my care of the patient were reviewed by me and considered in my medical decision  making (see chart for details).    Patient with generalized weakness and fatigue. Recent SDH. Denies new trauma. Denies headache.  Patient is awake and alert.  He is moving all extremities denies complaints.  CT head obtained and shows worsening of his left-sided subdural hematoma 24 mm from 19 mm with worsening midline shift and mass-effect on lateral ventricle.  INR is normal. Hemoglobin stable.  Coumadin has previously been reversed and he has not had it restarted.  D/w Dr. Ronnald Ramp of neurosurgery.  He has reviewed CT images and d/w Dr. Cyndy Freeze who knows patient.  Dr. Ronnald Ramp states Dr. Cyndy Freeze will perform surgery tomorrow. Recommend admission to hospitalist service at [facility].   Patient remained stable in the ED.  Protecting airway.  Labs are reassuring.  Admission to [facility] discussed with Dr. Clementeen Graham.  CRITICAL CARE Performed by: Ezequiel Essex Total critical care time: 45 minutes Critical care time was exclusive of separately billable procedures and treating other patients. Critical care was necessary to treat or prevent imminent or life-threatening deterioration. Critical care was time spent personally by me on the following activities: development of treatment plan with patient and/or surrogate as well  as nursing, discussions with consultants, evaluation of patient's response to treatment, examination of patient, obtaining history from patient or surrogate, ordering and performing treatments and interventions, ordering and review of laboratory studies, ordering and review of radiographic studies, pulse oximetry and re-evaluation of patient's condition.    Final Clinical Impressions(s) / ED Diagnoses   Final diagnoses:  Subdural hematoma [facility])    ED Discharge Orders    None       Ezequiel Essex, MD 02/04/17 1722

## 2017-02-04 NOTE — Progress Notes (Signed)
Patient ID: [name] [name], male   DOB: Aug 07, 1940, 76 y.o.   MRN: [id number] Spoke with the family at length today. Pt brought to ER with slow decline. EDP says pt awake, FC, and non--focal. INR normal. 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  Pt will need surgery to evacuate the SDH. This is non-emergent. spoke with Dr Cyndy Freeze who prefers to do the surgery tomorrow which seems reasonable. He asks for admission to medical team to help optimize status for surgery if possible. If they refuse to admit then I will be happy to do so. NPO after midnight. Dr Cyndy Freeze to see and discuss with family tomorrow.

## 2017-02-04 NOTE — H&P (Signed)
TRH H&P   Patient Demographics:    [name] [name], is a 76 y.o. male  MRN: [id number]   DOB - May 10, 1940  Admit Date - 02/04/2017  Outpatient Primary MD for the patient is Timmothy Euler, MD  Referring MD: Dr. Wyvonnia Dusky  Outpatient Specialists: Neurosurgery  Patient coming from: Home  Chief Complaint  Patient presents with  . Fatigue      HPI:    [name] [name]  is a 76 y.o. 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 He was hospitalized from 11/28-11/30 With bilateral subdural hematoma and mild midline shift. Since he was hemodynamically stable without significant neurological deficit neurosurgery recommended repeat CT in 3 weeks and placed him on Keppra. His anticoagulation was discontinued and  patient discharged home. Patient then got hospitalized from 12/30-12/5 with slurred speech, headache and confusion. CT head without any changes. Neurology was consulted and it was suspected that the symptoms were likely due to Dilkon which was stopped and patient discharged home. Patient felt fine for a few days but Wife noted patient was increasingly weak needing more assistance to get out of bed and go to the bathroom. He has been increasingly somnolent. He also has had poor appetite since last 2 days and increasingly confused since yesterday. Patient denies any headache, blurred vision, dizziness, nausea, vomiting, chest pain, shortness of breath, abdominal pain,fevers, chills, dysuria or diarrhea. Denies bladder or bowel incontinence (wife reports that patient could not make it to the bathroom this morning). No head injury or fall at home.  In the ED  vital  were stable except for heart rate in mid 50s. Blood will show WC of 10.8 K sodium of 134 chloride of 99, negative troponin. EKG showed sinus bradycardia at 54 with interventricular conduction delay.Chest x-ray showed atelectasis. CT of the head without contrast showed Increase in the size of left convexity subdural hematoma of 24 mm (from 19 mm previously) with increased midline shift of 9 mm. Size of the right convexity subdural hematoma was unchanged.  Neurosurgery Dr. Ronnald Ramp was consulted by ED physician who recommended transfer [facility] The question of subdural hematoma likely tomorrow by Dr. Cyndy Freeze.  Recommend hospitalist Admission for medical optimization for now.      Review of systems:    In addition to the HPI above, (Positive symptoms in bold) No Fever-chills, No Headache, No changes with Vision or hearing, No problems swallowing food or Liquids,Loss of appetite No Chest pain, Cough or Shortness of Breath, No Abdominal pain, No Nausea or Vomiting, Bowel movements are regular, No Blood in stool or Urine, No dysuria, No new skin rashes or bruises, No new joints pains-aches,  Increased generalized weakness, somnolence and confusion,  tingling, numbness in any extremity, No recent weight gain or loss, No polyuria, polydypsia or polyphagia, No significant Mental Stressors.     With Past History of the following :    Past Medical History:  Diagnosis Date  . Arthritis   . Atrial fibrillation ([place])    previously on Coumadin, reversed with head bleed  . Cataract   . Hyperlipidemia   . HYPERTENSION   . HYPOTHYROIDISM   . 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 Pre-diabetes   . Precancerous skin lesion    on back  . ROTATOR CUFF TEAR   . Subdural hematoma [facility])       Past Surgical History:  Procedure Laterality Date  . APPENDECTOMY    . CATARACT EXTRACTION W/PHACO Right 02/02/2014   Procedure: CATARACT EXTRACTION PHACO AND INTRAOCULAR LENS  PLACEMENT (IOC);  Surgeon: Elta Guadeloupe T. Gershon Crane, MD;  Location: [facility]S;  Service: Ophthalmology;  Laterality: Right;  CDE 12.17  . CATARACT EXTRACTION W/PHACO Left 02/16/2014   Procedure: CATARACT EXTRACTION PHACO AND INTRAOCULAR LENS PLACEMENT ;  Surgeon: Elta Guadeloupe T. Gershon Crane, MD;  Location: [facility];  Service: Ophthalmology;  Laterality: Left;  CDE:13.42  . EYE SURGERY  2015   cataract surgery. bilateral  . JOINT REPLACEMENT  2009   bilateral knee replacement  . KNEE SURGERY    . ROTATOR CUFF REPAIR Left   . SHOULDER SURGERY    . TOTAL KNEE ARTHROPLASTY Bilateral       Social History:     Social History   Tobacco Use  . Smoking status: Never Smoker  . Smokeless tobacco: Never Used  Substance Use Topics  . Alcohol use: No    Frequency: Never    Comment: remote use, h/o heavy binge use about 20 years ago     Lives - Home with wife  Mobility - Independent prior to SDH. Now requiring full assistance for ambulation.     Family History :     Family History  Problem Relation Age of Onset  . Heart attack Brother   . Early death Brother   . Asthma Father   . Vision loss Father   . Hypertension Mother   . Heart attack Brother 44  . Arthritis Brother       Home Medications:   Prior to Admission medications   Medication Sig Start Date End Date Taking? Authorizing Provider  acetaminophen-codeine (TYLENOL #3) 300-30 MG tablet Take 2 tablets by mouth every 4 (four) hours as needed for moderate pain. 01/24/17  Yes Debbe Odea, MD  albuterol (PROVENTIL HFA;VENTOLIN HFA) 108 (90 Base) MCG/ACT inhaler Inhale 2 puffs into the lungs every 6 (six) hours as needed for wheezing or shortness of breath. 06/29/16  Yes Eustaquio Maize, MD  atorvastatin (LIPITOR) 10 MG tablet TAKE 1 TABLET (10 MG TOTAL) BY MOUTH DAILY. 12/07/16  Yes Timmothy Euler, MD  diltiazem (CARDIZEM CD) 360 MG 24 hr capsule TAKE 1 CAPSULE (360 MG TOTAL) BY MOUTH DAILY. 10/25/16  Yes Timmothy Euler, MD  flecainide  (TAMBOCOR) 100 MG tablet TAKE 1 TABLET (100 MG TOTAL) BY MOUTH 2 (TWO) TIMES DAILY. 08/10/16  Yes Timmothy Euler, MD  levothyroxine (SYNTHROID, LEVOTHROID) 100 MCG tablet TAKE 1 TABLET (100 MCG TOTAL) BY MOUTH DAILY. 07/26/16  Yes Timmothy Euler, MD  lisinopril (PRINIVIL,ZESTRIL) 20 MG tablet TAKE 1 TABLET (20 MG TOTAL) BY MOUTH DAILY. 08/10/16  Yes Timmothy Euler, MD  metFORMIN [facility])  500 MG 24 hr tablet TAKE 1 TABLET (500 MG TOTAL) BY MOUTH DAILY WITH BREAKFAST. 12/07/16  Yes Timmothy Euler, MD  acetaminophen (TYLENOL) 500 MG tablet Take 1,000 mg by mouth every 6 (six) hours as needed for moderate pain.    [provider]     Allergies:    No Known Allergies   Physical Exam:   Vitals  Blood pressure 131/84, pulse 64, temperature 98.1 F (36.7 C), temperature source Oral, resp. rate 14, weight 129.7 kg (286 lb), SpO2 99 %.   General: Elderly obese male lying in bed, appears fatigued, not in distress HEENT: Pupils reactive bilaterally, EOMI, no pallor, no icterus, moist oral mucosa, supple neck Chest: Clear to auscultation bilaterally, no added sounds  CVS: S1 and S2 bradycardic,No murmurs rub or gallop GI: Soft, nondistended, nontender, bowel sounds present  Musculoskeletal: Warm, no edema ZOX:WRUEA and oriented 3 (Slow to respond), normal muscle strength, tone and reflexes. Normal sensation.    Data Review:    CBC Recent Labs  Lab 01/29/17 0353 02/04/17 1453  WBC 10.1 10.8*  HGB 13.7 15.0  HCT 43.4 45.0  PLT 236 242  MCV 92.9 90.2  MCH 29.3 30.1  MCHC 31.6 33.3  RDW 13.7 13.0  LYMPHSABS  --  1.9  MONOABS  --  1.1*  EOSABS  --  0.1  BASOSABS  --  0.0   ------------------------------------------------------------------------------------------------------------------  Chemistries  Recent Labs  Lab 01/29/17 0353 02/04/17 1453  NA 136 134*  K 4.7 3.7  CL 101 99*  CO2 30 24  GLUCOSE 104* 118*  BUN 14 15  CREATININE 0.88 0.77    CALCIUM 8.9 9.1  AST  --  19  ALT  --  26  ALKPHOS  --  58  BILITOT  --  0.5   ------------------------------------------------------------------------------------------------------------------ estimated creatinine clearance is 108.1 mL/min (by C-G formula based on SCr of 0.77 mg/dL). ------------------------------------------------------------------------------------------------------------------ Recent Labs    02/04/17 1453  TSH 1.302    Coagulation profile Recent Labs  Lab 02/04/17 1453  INR 0.99   ------------------------------------------------------------------------------------------------------------------- No results for input(s): DDIMER in the last 72 hours. -------------------------------------------------------------------------------------------------------------------  Cardiac Enzymes Recent Labs  Lab 02/04/17 1453  TROPONINI <0.03   ------------------------------------------------------------------------------------------------------------------ No results found for: BNP   ---------------------------------------------------------------------------------------------------------------  Urinalysis    Component Value Date/Time   COLORURINE YELLOW 05/11/2010 1145   APPEARANCEUR CLEAR 05/11/2010 1145   LABSPEC 1.020 05/11/2010 1145   PHURINE 7.0 05/11/2010 1145   GLUCOSEU NEGATIVE 05/11/2010 1145   HGBUR NEGATIVE 05/11/2010 1145   BILIRUBINUR NEGATIVE 05/11/2010 1145   KETONESUR NEGATIVE 05/11/2010 1145   PROTEINUR NEGATIVE 05/11/2010 1145   UROBILINOGEN 1.0 05/11/2010 1145   NITRITE NEGATIVE 05/11/2010 1145   LEUKOCYTESUR  05/11/2010 1145    NEGATIVE MICROSCOPIC NOT DONE ON URINES WITH NEGATIVE PROTEIN, BLOOD, LEUKOCYTES, NITRITE, OR GLUCOSE <1000 mg/dL.    ----------------------------------------------------------------------------------------------------------------   Imaging Results:    Dg Chest 2 View  Result Date: 02/04/2017 CLINICAL  DATA:  Patient with altered mental status. Increasing lethargy. EXAM: CHEST  2 VIEW COMPARISON:  Chest radiograph 06/20/2016 FINDINGS: Monitoring leads overlie the patient. Stable cardiac and mediastinal contours. Low lung volumes. Bibasilar heterogeneous pulmonary opacities. No pleural effusion or pneumothorax. Thoracic spine degenerative changes. IMPRESSION: Low lung volumes with basilar opacities favored to represent atelectasis. Infection not excluded. Electronically Signed   By: Lovey Newcomer M.D.   On: 02/04/2017 15:46   Ct Head Wo Contrast  Result Date: 02/04/2017 CLINICAL DATA:  Altered mental status.  Increased lethargy for 3 days. Recent subdural hematoma. EXAM: CT HEAD WITHOUT CONTRAST TECHNIQUE: Contiguous axial images were obtained from the base of the skull through the vertex without intravenous contrast. COMPARISON:  Head CT 01/28/2017 FINDINGS: Brain: There bilateral mixed density subdural hematomas. The right subdural hematoma is unchanged in size measuring approximately 18 mm. The left subdural hematoma is increased and measures 24 mm, previously 19 mm. There are areas of hyperattenuation that are suggestive of a more recent hemorrhagic event. Midline shift is worsened and now measures 9 mm to the right, previously 7 mm, at the level of the foramina of [place]. The mass effect on the ventricles is also worsened. No hydrocephalus or ventricular trapping. The basal cisterns remain patent. No intraparenchymal hemorrhage. Vascular: No hyperdense vessel or unexpected calcification. Skull: Normal. Negative for fracture or focal lesion. Sinuses/Orbits: Trace amount of bilateral mastoid fluid. Normal orbits. Other: None. IMPRESSION: 1. Increased size of left convexity subdural hematoma, with likely subacute hemorrhage in the interval since 01/28/2017, now measuring up to 24 mm, previously 19 mm. Worsening rightward midline shift now measures 9 mm. 2. Unchanged right convexity subdural hematoma. 3. Worsened  mass effect on the left lateral ventricle. No hydrocephalus currently, but patient at risk for developing entrapment of the left temporal horn. Critical Value/emergent results were called by telephone at the time of interpretation on 02/04/2017 at 3:46 pm to Dr. Ezequiel Essex , who verbally acknowledged these results. Electronically Signed   By: Ulyses Jarred M.D.   On: 02/04/2017 15:48    My personal review of EKG: Sinus bradycardia at 54 with interventricular conduction delay, no ST-T changes.   Assessment & Plan:    Principal Problem:   Subdural hematoma (HCC) -Bilateral subdural hematoma with increasing size of the left convexity and midline shift. -Needs evacuation of the Hematoma. Neurosurgery consulted and recommended transfer to -[facility] for possible surgery tomorrow. -Admit to neuro telemetry. No focal neurological deficit at present. Neuro checks every 2 hours. -Gentle IV hydration with normal saline. -Clear liquids for now. Nothing by mouth after midnight. Keppra was stopped recently for encephalopathy and slurred speech.monitor H&H closely.  Preoperative Clearance Based on growth perioperative cardiac risk patient has 0.65% Estimated risk for Perioperative myocardial infarction or Cardiac arrest. Patient was independent and capable of his METs prior to his acute illness. EKG is Negative for acute findings.Patient is euvolemic on exam and hemodynamically stable at present. He does not need further perioperative cardiac workup.     Active Problems:   Hypothyroidism Continue Synthroid.    Essential hypertension Resume Cardizem at lower dose. Hold lisinopril.    HLD (hyperlipidemia) Continue statin.    Paroxysmal atrial fibrillation (HCC) In sinus rhythm. Anticoagulation discontinued during last hospital edition. Continue Cardizem(Dose reduced due to bradycardia) and flecainide.    Acute metabolic encephalopathy Continue neuro checks. No signs of  infection.  Prediabetes   hold metformin. Monitor on sliding scale coverage.   DVT Prophylaxis SCDs  AM Labs Ordered, also please review Full Orders  Family Communication: Admission, patients condition and plan of care including tests being ordered have been discussed with the patient , His wife and daughter at bedside  Code Status Full code  Likely DC to  SNF  Condition GUARDED    Consults called: Neurosurgery (Dr. Ronnald Ramp by ED physician)  Admission status: Inpatient. Transferred to [facility] neuro telemetry  Time spent in minutes : 50   Danesha Kirchoff M.D on 02/04/2017 at 5:49 PM  Between 7am to 7pm -  Pager - [phone]. After 7pm go to [email or web address] - password [facility]  [facility] - Office  [phone]

## 2017-02-04 NOTE — ED Notes (Signed)
[facility] called with bed assignment and transfer information at [place].

## 2017-02-04 NOTE — ED Notes (Signed)
Tiffany RN spoke with pt's wife to give an update on patient's condition and transport condition; pt currently still waiting for transport by [facility] to [facility]; wife would like to be called when pt is en route to [facility].

## 2017-02-04 NOTE — ED Notes (Signed)
Pt reports Headache located in the back of head that just started. Rated 8/10. Dr Wyvonnia Dusky notified

## 2017-02-04 NOTE — ED Notes (Signed)
Report given to [facility]. 

## 2017-02-04 NOTE — ED Notes (Signed)
[facility] informed this pt is next for transport. ETA truck available in 10-15 min then in route to this facility.

## 2017-02-04 NOTE — ED Triage Notes (Signed)
Pt brought in by EMS due to increase lethargy, poor po intake and not ambulating as usual. Symptoms increasing over the last 3 days. Pt dx with subdural hematoma on the 28th was discharged from the hospital the 5th

## 2017-02-05 ENCOUNTER — Inpatient Hospital Stay (HOSPITAL_COMMUNITY): Payer: PPO | Admitting: Anesthesiology

## 2017-02-05 ENCOUNTER — Inpatient Hospital Stay (HOSPITAL_COMMUNITY): Payer: PPO

## 2017-02-05 ENCOUNTER — Encounter (HOSPITAL_COMMUNITY)
Admission: EM | Disposition: A | Discharge status: Home Health | Payer: Self-pay | Source: Home / Self Care | Attending: Pulmonary Disease

## 2017-02-05 ENCOUNTER — Other Ambulatory Visit: Payer: Self-pay

## 2017-02-05 ENCOUNTER — Encounter (HOSPITAL_COMMUNITY): Payer: Self-pay | Admitting: *Deleted

## 2017-02-05 DIAGNOSIS — D72829 Elevated white blood cell count, unspecified: Secondary | ICD-10-CM

## 2017-02-05 DIAGNOSIS — Z8249 Family history of ischemic heart disease and other diseases of the circulatory system: Secondary | ICD-10-CM | POA: Diagnosis not present

## 2017-02-05 DIAGNOSIS — E785 Hyperlipidemia, unspecified: Secondary | ICD-10-CM

## 2017-02-05 DIAGNOSIS — Z79890 Hormone replacement therapy (postmenopausal): Secondary | ICD-10-CM | POA: Diagnosis not present

## 2017-02-05 DIAGNOSIS — Z7984 Long term (current) use of oral hypoglycemic drugs: Secondary | ICD-10-CM | POA: Diagnosis not present

## 2017-02-05 DIAGNOSIS — I480 Paroxysmal atrial fibrillation: Secondary | ICD-10-CM | POA: Diagnosis not present

## 2017-02-05 DIAGNOSIS — G9341 Metabolic encephalopathy: Secondary | ICD-10-CM | POA: Diagnosis not present

## 2017-02-05 DIAGNOSIS — R400 Somnolence: Secondary | ICD-10-CM

## 2017-02-05 DIAGNOSIS — Z6841 Body Mass Index (BMI) 40.0 and over, adult: Secondary | ICD-10-CM | POA: Diagnosis not present

## 2017-02-05 DIAGNOSIS — Z821 Family history of blindness and visual loss: Secondary | ICD-10-CM | POA: Diagnosis not present

## 2017-02-05 DIAGNOSIS — K5900 Constipation, unspecified: Secondary | ICD-10-CM | POA: Diagnosis not present

## 2017-02-05 DIAGNOSIS — S065X9A Traumatic subdural hemorrhage with loss of consciousness of unspecified duration, initial encounter: Secondary | ICD-10-CM

## 2017-02-05 DIAGNOSIS — Z961 Presence of intraocular lens: Secondary | ICD-10-CM | POA: Diagnosis not present

## 2017-02-05 DIAGNOSIS — H548 Legal blindness, as defined in USA: Secondary | ICD-10-CM | POA: Diagnosis not present

## 2017-02-05 DIAGNOSIS — I10 Essential (primary) hypertension: Secondary | ICD-10-CM | POA: Diagnosis not present

## 2017-02-05 DIAGNOSIS — Z96653 Presence of artificial knee joint, bilateral: Secondary | ICD-10-CM | POA: Diagnosis not present

## 2017-02-05 DIAGNOSIS — E039 Hypothyroidism, unspecified: Secondary | ICD-10-CM | POA: Diagnosis not present

## 2017-02-05 DIAGNOSIS — R7303 Prediabetes: Secondary | ICD-10-CM | POA: Diagnosis not present

## 2017-02-05 DIAGNOSIS — R001 Bradycardia, unspecified: Secondary | ICD-10-CM | POA: Diagnosis not present

## 2017-02-05 DIAGNOSIS — E871 Hypo-osmolality and hyponatremia: Secondary | ICD-10-CM | POA: Diagnosis not present

## 2017-02-05 DIAGNOSIS — E7439 Other disorders of intestinal carbohydrate absorption: Secondary | ICD-10-CM | POA: Diagnosis not present

## 2017-02-05 DIAGNOSIS — S065X9D Traumatic subdural hemorrhage with loss of consciousness of unspecified duration, subsequent encounter: Secondary | ICD-10-CM | POA: Diagnosis not present

## 2017-02-05 DIAGNOSIS — Z79899 Other long term (current) drug therapy: Secondary | ICD-10-CM | POA: Diagnosis not present

## 2017-02-05 HISTORY — PX: CRANIOTOMY: SHX93

## 2017-02-05 LAB — POCT I-STAT 3, ART BLOOD GAS (G3+)
ACID-BASE DEFICIT: 1 mmol/L (ref 0.0–2.0)
Bicarbonate: 24.3 mmol/L (ref 20.0–28.0)
O2 Saturation: 100 %
PH ART: 7.373 (ref 7.350–7.450)
PO2 ART: 320 mmHg — AB (ref 83.0–108.0)
TCO2: 26 mmol/L (ref 22–32)
pCO2 arterial: 41.8 mmHg (ref 32.0–48.0)

## 2017-02-05 LAB — URINALYSIS, ROUTINE W REFLEX MICROSCOPIC
Bacteria, UA: NONE SEEN
Bilirubin Urine: NEGATIVE
GLUCOSE, UA: NEGATIVE mg/dL
Hgb urine dipstick: NEGATIVE
KETONES UR: NEGATIVE mg/dL
Leukocytes, UA: NEGATIVE
NITRITE: NEGATIVE
PH: 5 (ref 5.0–8.0)
Protein, ur: 30 mg/dL — AB
SPECIFIC GRAVITY, URINE: 1.023 (ref 1.005–1.030)

## 2017-02-05 LAB — CBC
HCT: 45.4 % (ref 39.0–52.0)
HCT: 43.3 % (ref 39.0–52.0)
HEMATOCRIT: 39.9 % (ref 39.0–52.0)
HEMOGLOBIN: 15.5 g/dL (ref 13.0–17.0)
HEMOGLOBIN: 13.7 g/dL (ref 13.0–17.0)
Hemoglobin: 14.6 g/dL (ref 13.0–17.0)
MCH: 30.3 pg (ref 26.0–34.0)
MCH: 29.8 pg (ref 26.0–34.0)
MCH: 30 pg (ref 26.0–34.0)
MCHC: 34.1 g/dL (ref 30.0–36.0)
MCHC: 33.7 g/dL (ref 30.0–36.0)
MCHC: 34.3 g/dL (ref 30.0–36.0)
MCV: 88.7 fL (ref 78.0–100.0)
MCV: 88.4 fL (ref 78.0–100.0)
MCV: 87.3 fL (ref 78.0–100.0)
PLATELETS: 272 10*3/uL (ref 150–400)
PLATELETS: 276 10*3/uL (ref 150–400)
Platelets: 263 10*3/uL (ref 150–400)
RBC: 5.12 MIL/uL (ref 4.22–5.81)
RBC: 4.9 MIL/uL (ref 4.22–5.81)
RBC: 4.57 MIL/uL (ref 4.22–5.81)
RDW: 13 % (ref 11.5–15.5)
RDW: 13.1 % (ref 11.5–15.5)
RDW: 12.9 % (ref 11.5–15.5)
WBC: 11.5 10*3/uL — ABNORMAL HIGH (ref 4.0–10.5)
WBC: 10.8 10*3/uL — AB (ref 4.0–10.5)
WBC: 11.6 10*3/uL — AB (ref 4.0–10.5)

## 2017-02-05 LAB — SURGICAL PCR SCREEN
MRSA, PCR: NEGATIVE
STAPHYLOCOCCUS AUREUS: NEGATIVE

## 2017-02-05 LAB — BASIC METABOLIC PANEL
ANION GAP: 11 (ref 5–15)
BUN: 12 mg/dL (ref 6–20)
CHLORIDE: 98 mmol/L — AB (ref 101–111)
CO2: 26 mmol/L (ref 22–32)
CREATININE: 0.85 mg/dL (ref 0.61–1.24)
Calcium: 9.3 mg/dL (ref 8.9–10.3)
GFR calc non Af Amer: >60 mL/min (ref >60)
Glucose, Bld: 102 mg/dL — ABNORMAL HIGH (ref 65–99)
Potassium: 3.7 mmol/L (ref 3.5–5.1)
SODIUM: 135 mmol/L (ref 135–145)

## 2017-02-05 LAB — TYPE AND SCREEN
ABO/RH(D): B POS
ANTIBODY SCREEN: NEGATIVE

## 2017-02-05 LAB — GLUCOSE, CAPILLARY
GLUCOSE-CAPILLARY: 111 mg/dL — AB (ref 65–99)
GLUCOSE-CAPILLARY: 84 mg/dL (ref 65–99)
GLUCOSE-CAPILLARY: 82 mg/dL (ref 65–99)
Glucose-Capillary: 105 mg/dL — ABNORMAL HIGH (ref 65–99)
Glucose-Capillary: 136 mg/dL — ABNORMAL HIGH (ref 65–99)
Glucose-Capillary: 142 mg/dL — ABNORMAL HIGH (ref 65–99)
Glucose-Capillary: 177 mg/dL — ABNORMAL HIGH (ref 65–99)

## 2017-02-05 LAB — ABO/RH: ABO/RH(D): B POS

## 2017-02-05 SURGERY — CRANIOTOMY HEMATOMA EVACUATION SUBDURAL
Anesthesia: General

## 2017-02-05 MED ORDER — FLECAINIDE ACETATE 100 MG PO TABS
100.0000 mg | ORAL_TABLET | Freq: Two times a day (BID) | ORAL | Status: DC
  Administered 2017-02-06 – 2017-02-08 (×4): 100 mg via ORAL
  Filled 2017-02-05 (×7): qty 1

## 2017-02-05 MED ORDER — ALBUTEROL SULFATE (2.5 MG/3ML) 0.083% IN NEBU: 3.0000 mL | INHALATION_SOLUTION | Freq: Four times a day (QID) | RESPIRATORY_TRACT | Status: DC | PRN

## 2017-02-05 MED ORDER — LEVOTHYROXINE SODIUM 100 MCG PO TABS
100.0000 ug | ORAL_TABLET | Freq: Every day | ORAL | Status: DC
  Administered 2017-02-07 – 2017-02-08 (×2): 100 ug via ORAL
  Filled 2017-02-05 (×2): qty 1

## 2017-02-05 MED ORDER — DEXAMETHASONE SODIUM PHOSPHATE 4 MG/ML IJ SOLN
4.0000 mg | Freq: Three times a day (TID) | INTRAMUSCULAR | Status: DC
  Administered 2017-02-07 – 2017-02-08 (×2): 4 mg via INTRAVENOUS
  Filled 2017-02-05 (×3): qty 1

## 2017-02-05 MED ORDER — CEFAZOLIN SODIUM-DEXTROSE 2-4 GM/100ML-% IV SOLN
2.0000 g | Freq: Three times a day (TID) | INTRAVENOUS | Status: AC
  Administered 2017-02-05 – 2017-02-06 (×2): 2 g via INTRAVENOUS
  Filled 2017-02-05 (×2): qty 100

## 2017-02-05 MED ORDER — BACITRACIN ZINC 500 UNIT/GM EX OINT
TOPICAL_OINTMENT | CUTANEOUS | Status: DC | PRN
  Administered 2017-02-05: 1 via TOPICAL

## 2017-02-05 MED ORDER — LIDOCAINE-EPINEPHRINE 2 %-1:100000 IJ SOLN
INTRAMUSCULAR | Status: AC
  Filled 2017-02-05: qty 1

## 2017-02-05 MED ORDER — ORAL CARE MOUTH RINSE
15.0000 mL | OROMUCOSAL | Status: DC
  Administered 2017-02-05 – 2017-02-06 (×7): 15 mL via OROMUCOSAL

## 2017-02-05 MED ORDER — HYDROMORPHONE HCL 1 MG/ML IJ SOLN: 0.2500 mg | INTRAMUSCULAR | Status: DC | PRN

## 2017-02-05 MED ORDER — FENTANYL CITRATE (PF) 250 MCG/5ML IJ SOLN
INTRAMUSCULAR | Status: AC
  Filled 2017-02-05: qty 5

## 2017-02-05 MED ORDER — THROMBIN (RECOMBINANT) 20000 UNITS EX SOLR
CUTANEOUS | Status: AC
  Filled 2017-02-05: qty 20000

## 2017-02-05 MED ORDER — FENTANYL CITRATE (PF) 100 MCG/2ML IJ SOLN
INTRAMUSCULAR | Status: AC
  Filled 2017-02-05: qty 2

## 2017-02-05 MED ORDER — NALOXONE HCL 0.4 MG/ML IJ SOLN: 0.0800 mg | INTRAMUSCULAR | Status: DC | PRN

## 2017-02-05 MED ORDER — BUPIVACAINE-EPINEPHRINE (PF) 0.5% -1:200000 IJ SOLN
INTRAMUSCULAR | Status: DC | PRN
  Administered 2017-02-05: 30 mL via PERINEURAL

## 2017-02-05 MED ORDER — HYDROCODONE-ACETAMINOPHEN 5-325 MG PO TABS: 1.0000 | ORAL_TABLET | ORAL | Status: DC | PRN

## 2017-02-05 MED ORDER — LIDOCAINE HCL (CARDIAC) 20 MG/ML IV SOLN
INTRAVENOUS | Status: DC | PRN
  Administered 2017-02-05: 80 mg via INTRAVENOUS

## 2017-02-05 MED ORDER — SODIUM CHLORIDE 0.9 % IV SOLN
INTRAVENOUS | Status: DC
  Administered 2017-02-05 – 2017-02-08 (×5): via INTRAVENOUS

## 2017-02-05 MED ORDER — ATORVASTATIN CALCIUM 10 MG PO TABS
10.0000 mg | ORAL_TABLET | Freq: Every day | ORAL | Status: DC
  Administered 2017-02-07 – 2017-02-08 (×2): 10 mg via ORAL
  Filled 2017-02-05 (×2): qty 1

## 2017-02-05 MED ORDER — DEXTROSE 5 % IV SOLN
3.0000 g | INTRAVENOUS | Status: AC
  Administered 2017-02-05: 3 g via INTRAVENOUS
  Filled 2017-02-05: qty 3

## 2017-02-05 MED ORDER — FENTANYL CITRATE (PF) 100 MCG/2ML IJ SOLN
100.0000 ug | Freq: Once | INTRAMUSCULAR | Status: AC
  Administered 2017-02-05: 100 ug via INTRAVENOUS

## 2017-02-05 MED ORDER — LABETALOL HCL 5 MG/ML IV SOLN
5.0000 mg | INTRAVENOUS | Status: DC | PRN
  Administered 2017-02-05: 5 mg via INTRAVENOUS
  Filled 2017-02-05: qty 4

## 2017-02-05 MED ORDER — DEXAMETHASONE SODIUM PHOSPHATE 10 MG/ML IJ SOLN
INTRAMUSCULAR | Status: AC
  Filled 2017-02-05: qty 1

## 2017-02-05 MED ORDER — DEXAMETHASONE SODIUM PHOSPHATE 4 MG/ML IJ SOLN
4.0000 mg | Freq: Four times a day (QID) | INTRAMUSCULAR | Status: AC
  Administered 2017-02-06 – 2017-02-07 (×4): 4 mg via INTRAVENOUS
  Filled 2017-02-05 (×4): qty 1

## 2017-02-05 MED ORDER — SODIUM CHLORIDE 0.9 % IV SOLN
1000.0000 mg | INTRAVENOUS | Status: AC
  Administered 2017-02-05: 1000 mg via INTRAVENOUS
  Filled 2017-02-05: qty 10

## 2017-02-05 MED ORDER — DOCUSATE SODIUM 100 MG PO CAPS
100.0000 mg | ORAL_CAPSULE | Freq: Two times a day (BID) | ORAL | Status: DC
  Administered 2017-02-06: 100 mg via ORAL
  Filled 2017-02-05 (×2): qty 1

## 2017-02-05 MED ORDER — 0.9 % SODIUM CHLORIDE (POUR BTL) OPTIME
TOPICAL | Status: DC | PRN
  Administered 2017-02-05 (×7): 1000 mL

## 2017-02-05 MED ORDER — PANTOPRAZOLE SODIUM 40 MG IV SOLR
40.0000 mg | Freq: Every day | INTRAVENOUS | Status: DC
  Administered 2017-02-05 – 2017-02-07 (×3): 40 mg via INTRAVENOUS
  Filled 2017-02-05 (×3): qty 40

## 2017-02-05 MED ORDER — INSULIN ASPART 100 UNIT/ML ~~LOC~~ SOLN
0.0000 [IU] | SUBCUTANEOUS | Status: DC
  Administered 2017-02-05 – 2017-02-06 (×3): 1 [IU] via SUBCUTANEOUS
  Administered 2017-02-06: 2 [IU] via SUBCUTANEOUS
  Administered 2017-02-06 (×3): 1 [IU] via SUBCUTANEOUS
  Administered 2017-02-06: 2 [IU] via SUBCUTANEOUS
  Administered 2017-02-07 (×5): 1 [IU] via SUBCUTANEOUS

## 2017-02-05 MED ORDER — LIDOCAINE 2% (20 MG/ML) 5 ML SYRINGE
INTRAMUSCULAR | Status: AC
  Filled 2017-02-05: qty 5

## 2017-02-05 MED ORDER — KETOROLAC TROMETHAMINE 30 MG/ML IJ SOLN: 30.0000 mg | Freq: Once | INTRAMUSCULAR | Status: DC | PRN

## 2017-02-05 MED ORDER — PROPOFOL 1000 MG/100ML IV EMUL
INTRAVENOUS | Status: AC
  Administered 2017-02-05: 40 ug/min
  Filled 2017-02-05: qty 100

## 2017-02-05 MED ORDER — DILTIAZEM HCL 100 MG IV SOLR
10.0000 mg/h | INTRAVENOUS | Status: DC
  Administered 2017-02-05: 10 mg/h via INTRAVENOUS
  Filled 2017-02-05 (×2): qty 100

## 2017-02-05 MED ORDER — ONDANSETRON HCL 4 MG/2ML IJ SOLN
4.0000 mg | Freq: Four times a day (QID) | INTRAMUSCULAR | Status: DC | PRN
  Administered 2017-02-05: 4 mg via INTRAVENOUS

## 2017-02-05 MED ORDER — ONDANSETRON HCL 4 MG/2ML IJ SOLN: 4.0000 mg | INTRAMUSCULAR | Status: DC | PRN

## 2017-02-05 MED ORDER — PROPOFOL 10 MG/ML IV BOLUS
INTRAVENOUS | Status: DC | PRN
Start: 2017-02-05 — End: 2017-02-05
  Administered 2017-02-05: 10 mg via INTRAVENOUS
  Administered 2017-02-05: 50 mg via INTRAVENOUS
  Administered 2017-02-05: 150 mg via INTRAVENOUS

## 2017-02-05 MED ORDER — THROMBIN (RECOMBINANT) 5000 UNITS EX SOLR
CUTANEOUS | Status: AC
  Filled 2017-02-05: qty 5000

## 2017-02-05 MED ORDER — BUPIVACAINE-EPINEPHRINE (PF) 0.5% -1:200000 IJ SOLN
INTRAMUSCULAR | Status: AC
  Filled 2017-02-05: qty 30

## 2017-02-05 MED ORDER — ROCURONIUM BROMIDE 100 MG/10ML IV SOLN
INTRAVENOUS | Status: DC | PRN
  Administered 2017-02-05: 20 mg via INTRAVENOUS
  Administered 2017-02-05: 10 mg via INTRAVENOUS
  Administered 2017-02-05: 20 mg via INTRAVENOUS

## 2017-02-05 MED ORDER — FENTANYL CITRATE (PF) 100 MCG/2ML IJ SOLN
INTRAMUSCULAR | Status: DC | PRN
  Administered 2017-02-05: 50 ug via INTRAVENOUS
  Administered 2017-02-05: 100 ug via INTRAVENOUS

## 2017-02-05 MED ORDER — THROMBIN (RECOMBINANT) 5000 UNITS EX SOLR
CUTANEOUS | Status: DC | PRN
  Administered 2017-02-05: 5000 [IU] via TOPICAL

## 2017-02-05 MED ORDER — ONDANSETRON HCL 4 MG PO TABS: 4.0000 mg | ORAL_TABLET | Freq: Four times a day (QID) | ORAL | Status: DC | PRN

## 2017-02-05 MED ORDER — PROPOFOL 10 MG/ML IV BOLUS
INTRAVENOUS | Status: AC
  Filled 2017-02-05: qty 20

## 2017-02-05 MED ORDER — HYDRALAZINE HCL 20 MG/ML IJ SOLN
5.0000 mg | INTRAMUSCULAR | Status: DC | PRN
  Administered 2017-02-05 (×2): 10 mg via INTRAVENOUS
  Filled 2017-02-05 (×2): qty 1

## 2017-02-05 MED ORDER — PROPOFOL 1000 MG/100ML IV EMUL
INTRAVENOUS | Status: AC
  Filled 2017-02-05: qty 100

## 2017-02-05 MED ORDER — HYDROMORPHONE HCL 1 MG/ML IJ SOLN
0.5000 mg | INTRAMUSCULAR | Status: DC | PRN
  Administered 2017-02-05 – 2017-02-06 (×2): 1 mg via INTRAVENOUS
  Filled 2017-02-05 (×2): qty 1

## 2017-02-05 MED ORDER — BISACODYL 5 MG PO TBEC: 5.0000 mg | DELAYED_RELEASE_TABLET | Freq: Every day | ORAL | Status: DC | PRN

## 2017-02-05 MED ORDER — DEXAMETHASONE SODIUM PHOSPHATE 10 MG/ML IJ SOLN
INTRAMUSCULAR | Status: DC | PRN
  Administered 2017-02-05: 10 mg via INTRAVENOUS

## 2017-02-05 MED ORDER — SODIUM CHLORIDE 0.9 % IR SOLN
Status: DC | PRN
  Administered 2017-02-05: 13:00:00

## 2017-02-05 MED ORDER — HEMOSTATIC AGENTS (NO CHARGE) OPTIME
TOPICAL | Status: DC | PRN
  Administered 2017-02-05 (×3): 1 via TOPICAL

## 2017-02-05 MED ORDER — MEPERIDINE HCL 25 MG/ML IJ SOLN: 6.2500 mg | INTRAMUSCULAR | Status: DC | PRN

## 2017-02-05 MED ORDER — LIDOCAINE-EPINEPHRINE 2 %-1:100000 IJ SOLN
INTRAMUSCULAR | Status: DC | PRN
  Administered 2017-02-05: 30 mL via INTRADERMAL

## 2017-02-05 MED ORDER — ROCURONIUM BROMIDE 10 MG/ML (PF) SYRINGE
PREFILLED_SYRINGE | INTRAVENOUS | Status: AC
  Filled 2017-02-05: qty 5

## 2017-02-05 MED ORDER — FLEET ENEMA 7-19 GM/118ML RE ENEM: 1.0000 | ENEMA | Freq: Once | RECTAL | Status: DC | PRN

## 2017-02-05 MED ORDER — PROMETHAZINE HCL 25 MG PO TABS: 12.5000 mg | ORAL_TABLET | ORAL | Status: DC | PRN

## 2017-02-05 MED ORDER — DILTIAZEM HCL ER COATED BEADS 300 MG PO CP24: 300.0000 mg | ORAL_CAPSULE | Freq: Every day | ORAL | Status: DC

## 2017-02-05 MED ORDER — DEXAMETHASONE SODIUM PHOSPHATE 10 MG/ML IJ SOLN
6.0000 mg | Freq: Four times a day (QID) | INTRAMUSCULAR | Status: AC
  Administered 2017-02-05 – 2017-02-06 (×4): 6 mg via INTRAVENOUS
  Filled 2017-02-05 (×4): qty 1

## 2017-02-05 MED ORDER — CHLORHEXIDINE GLUCONATE 0.12% ORAL RINSE (MEDLINE KIT)
15.0000 mL | Freq: Two times a day (BID) | OROMUCOSAL | Status: DC
  Administered 2017-02-05 – 2017-02-06 (×2): 15 mL via OROMUCOSAL

## 2017-02-05 MED ORDER — SODIUM CHLORIDE 0.9 % IV SOLN
INTRAVENOUS | Status: DC
  Administered 2017-02-05 (×3): via INTRAVENOUS

## 2017-02-05 MED ORDER — SODIUM CHLORIDE 0.9 % IV SOLN
500.0000 mg | Freq: Two times a day (BID) | INTRAVENOUS | Status: DC
  Administered 2017-02-05 – 2017-02-07 (×5): 500 mg via INTRAVENOUS
  Filled 2017-02-05 (×7): qty 5

## 2017-02-05 MED ORDER — SENNA 8.6 MG PO TABS
1.0000 | ORAL_TABLET | Freq: Two times a day (BID) | ORAL | Status: DC
  Administered 2017-02-06: 8.6 mg via ORAL
  Filled 2017-02-05 (×2): qty 1

## 2017-02-05 MED ORDER — BACITRACIN ZINC 500 UNIT/GM EX OINT
TOPICAL_OINTMENT | CUTANEOUS | Status: AC
  Filled 2017-02-05: qty 28.35

## 2017-02-05 MED ORDER — ACETAMINOPHEN 325 MG PO TABS: 650.0000 mg | ORAL_TABLET | Freq: Four times a day (QID) | ORAL | Status: DC | PRN

## 2017-02-05 MED ORDER — SENNOSIDES-DOCUSATE SODIUM 8.6-50 MG PO TABS: 1.0000 | ORAL_TABLET | Freq: Every evening | ORAL | Status: DC | PRN

## 2017-02-05 MED ORDER — ONDANSETRON HCL 4 MG PO TABS: 4.0000 mg | ORAL_TABLET | ORAL | Status: DC | PRN

## 2017-02-05 MED ORDER — ONDANSETRON HCL 4 MG/2ML IJ SOLN
INTRAMUSCULAR | Status: AC
  Filled 2017-02-05: qty 2

## 2017-02-05 MED ORDER — PROMETHAZINE HCL 25 MG/ML IJ SOLN: 6.2500 mg | INTRAMUSCULAR | Status: DC | PRN

## 2017-02-05 MED ORDER — PROPOFOL 1000 MG/100ML IV EMUL
5.0000 ug/kg/min | INTRAVENOUS | Status: DC
  Administered 2017-02-05 – 2017-02-06 (×2): 40 ug/kg/min via INTRAVENOUS
  Filled 2017-02-05 (×3): qty 100

## 2017-02-05 MED ORDER — ACETAMINOPHEN 650 MG RE SUPP: 650.0000 mg | Freq: Four times a day (QID) | RECTAL | Status: DC | PRN

## 2017-02-05 SURGICAL SUPPLY — 91 items
BAG DECANTER FOR FLEXI CONT (MISCELLANEOUS) ×3 IMPLANT
BATTERY IQ STERILE (MISCELLANEOUS) ×3 IMPLANT
BENZOIN TINCTURE PRP APPL 2/3 (GAUZE/BANDAGES/DRESSINGS) IMPLANT
BLADE CLIPPER SURG (BLADE) ×3 IMPLANT
BLADE ULTRA TIP 2M (BLADE) ×3 IMPLANT
BNDG GAUZE ELAST 4 BULKY (GAUZE/BANDAGES/DRESSINGS) IMPLANT
BUR ACORN 6.0 PRECISION (BURR) ×4 IMPLANT
BUR ACORN 6.0MM PRECISION (BURR) ×2
BUR MATCHSTICK NEURO 3.0 LAGG (BURR) IMPLANT
BUR SPIRAL ROUTER 2.3 (BUR) IMPLANT
BUR SPIRAL ROUTER 2.3MM (BUR)
CANISTER SUCT 3000ML PPV (MISCELLANEOUS) ×3 IMPLANT
CARTRIDGE OIL MAESTRO DRILL (MISCELLANEOUS) ×1 IMPLANT
CATH ROBINSON RED A/P 14FR (CATHETERS) IMPLANT
CHLORAPREP W/TINT 26ML (MISCELLANEOUS) ×3 IMPLANT
CLIP VESOCCLUDE MED 6/CT (CLIP) IMPLANT
DECANTER SPIKE VIAL GLASS SM (MISCELLANEOUS) ×3 IMPLANT
DIFFUSER DRILL AIR PNEUMATIC (MISCELLANEOUS) ×3 IMPLANT
DRAIN JACKSON PRT FLT 10 (DRAIN) ×3 IMPLANT
DRAPE NEUROLOGICAL W/INCISE (DRAPES) ×3 IMPLANT
DRAPE SHEET LG 3/4 BI-LAMINATE (DRAPES) ×3 IMPLANT
DRAPE SURG 17X23 STRL (DRAPES) IMPLANT
DRAPE WARM FLUID 44X44 (DRAPE) ×3 IMPLANT
DRSG MEPILEX BORDER 4X4 (GAUZE/BANDAGES/DRESSINGS) ×3 IMPLANT
DRSG MEPILEX BORDER 4X8 (GAUZE/BANDAGES/DRESSINGS) ×3 IMPLANT
ELECT COATED BLADE 2.86 ST (ELECTRODE) ×3 IMPLANT
ELECT REM PT RETURN 9FT ADLT (ELECTROSURGICAL) ×3
ELECTRODE REM PT RTRN 9FT ADLT (ELECTROSURGICAL) ×1 IMPLANT
EVACUATOR 1/8 PVC DRAIN (DRAIN) IMPLANT
EVACUATOR SILICONE 100CC (DRAIN) ×3 IMPLANT
GAUZE SPONGE 4X4 12PLY STRL (GAUZE/BANDAGES/DRESSINGS) ×3 IMPLANT
GAUZE SPONGE 4X4 16PLY XRAY LF (GAUZE/BANDAGES/DRESSINGS) IMPLANT
GLOVE BIO SURGEON STRL SZ7 (GLOVE) ×6 IMPLANT
GLOVE BIOGEL PI IND STRL 7.0 (GLOVE) IMPLANT
GLOVE BIOGEL PI IND STRL 7.5 (GLOVE) ×2 IMPLANT
GLOVE BIOGEL PI INDICATOR 7.0 (GLOVE)
GLOVE BIOGEL PI INDICATOR 7.5 (GLOVE) ×4
GLOVE EXAM NITRILE LRG STRL (GLOVE) ×3 IMPLANT
GLOVE EXAM NITRILE XL STR (GLOVE) IMPLANT
GLOVE EXAM NITRILE XS STR PU (GLOVE) IMPLANT
GLOVE SS BIOGEL STRL SZ 7.5 (GLOVE) ×4 IMPLANT
GLOVE SUPERSENSE BIOGEL SZ 7.5 (GLOVE) ×8
GOWN STRL REUS W/ TWL LRG LVL3 (GOWN DISPOSABLE) ×2 IMPLANT
GOWN STRL REUS W/ TWL XL LVL3 (GOWN DISPOSABLE) ×1 IMPLANT
GOWN STRL REUS W/TWL 2XL LVL3 (GOWN DISPOSABLE) IMPLANT
GOWN STRL REUS W/TWL LRG LVL3 (GOWN DISPOSABLE) ×4
GOWN STRL REUS W/TWL XL LVL3 (GOWN DISPOSABLE) ×2
HEMOSTAT POWDER KIT SURGIFOAM (HEMOSTASIS) ×3 IMPLANT
HEMOSTAT POWDER SURGIFOAM 1G (HEMOSTASIS) ×3 IMPLANT
HEMOSTAT SURGICEL 2X14 (HEMOSTASIS) IMPLANT
KIT BASIN OR (CUSTOM PROCEDURE TRAY) ×3 IMPLANT
KIT ROOM TURNOVER OR (KITS) ×3 IMPLANT
NEEDLE HYPO 21X1.5 SAFETY (NEEDLE) ×3 IMPLANT
NEEDLE HYPO 25X1 1.5 SAFETY (NEEDLE) ×3 IMPLANT
NS IRRIG 1000ML POUR BTL (IV SOLUTION) ×21 IMPLANT
OIL CARTRIDGE MAESTRO DRILL (MISCELLANEOUS) ×3
PACK CRANIOTOMY (CUSTOM PROCEDURE TRAY) ×3 IMPLANT
PATTIES SURGICAL .5 X.5 (GAUZE/BANDAGES/DRESSINGS) IMPLANT
PATTIES SURGICAL .5 X3 (DISPOSABLE) IMPLANT
PATTIES SURGICAL .5X1.5 (GAUZE/BANDAGES/DRESSINGS) IMPLANT
PATTIES SURGICAL 1X1 (DISPOSABLE) IMPLANT
PIN MAYFIELD SKULL DISP (PIN) ×3 IMPLANT
PLATE 1.5/0.5 18.5MM BURR HOLE (Plate) ×3 IMPLANT
PLATE 1.5/0.5 22MM BURR HO (Plate) ×3 IMPLANT
SCREW SELF DRILL HT 1.5/4MM (Screw) ×33 IMPLANT
SPONGE LAP 18X18 X RAY DECT (DISPOSABLE) ×3 IMPLANT
SPONGE NEURO XRAY DETECT 1X3 (DISPOSABLE) IMPLANT
SPONGE SURGIFOAM ABS GEL 100 (HEMOSTASIS) ×6 IMPLANT
SPONGE SURGIFOAM ABS GEL 100C (HEMOSTASIS) ×3 IMPLANT
STAPLER VISISTAT 35W (STAPLE) ×6 IMPLANT
STOCKINETTE 6  STRL (DRAPES) ×2
STOCKINETTE 6 STRL (DRAPES) ×1 IMPLANT
STRIP SURGICAL 1 X 6 IN (GAUZE/BANDAGES/DRESSINGS) IMPLANT
STRIP SURGICAL 1/2 X 6 IN (GAUZE/BANDAGES/DRESSINGS) IMPLANT
STRIP SURGICAL 1/4 X 6 IN (GAUZE/BANDAGES/DRESSINGS) IMPLANT
STRIP SURGICAL 3/4 X 6 IN (GAUZE/BANDAGES/DRESSINGS) IMPLANT
SUT ETHILON 3 0 FSL (SUTURE) IMPLANT
SUT ETHILON 3 0 PS 1 (SUTURE) IMPLANT
SUT NURALON 4 0 TR CR/8 (SUTURE) ×3 IMPLANT
SUT VIC AB 0 CT1 18XCR BRD8 (SUTURE) ×1 IMPLANT
SUT VIC AB 0 CT1 8-18 (SUTURE) ×2
SUT VIC AB 2-0 CT1 18 (SUTURE) ×9 IMPLANT
SUT VIC AB 3-0 SH 8-18 (SUTURE) ×3 IMPLANT
SYR 30ML LL (SYRINGE) ×6 IMPLANT
TOWEL GREEN STERILE (TOWEL DISPOSABLE) ×3 IMPLANT
TOWEL GREEN STERILE FF (TOWEL DISPOSABLE) ×3 IMPLANT
TRAY FOLEY W/METER SILVER 16FR (SET/KITS/TRAYS/PACK) ×3 IMPLANT
TUBE CONNECTING 12'X1/4 (SUCTIONS) ×1
TUBE CONNECTING 12X1/4 (SUCTIONS) ×2 IMPLANT
UNDERPAD 30X30 (UNDERPADS AND DIAPERS) IMPLANT
WATER STERILE IRR 1000ML POUR (IV SOLUTION) ×3 IMPLANT

## 2017-02-05 NOTE — Care Management Note (Signed)
Case Management Note  Patient Details  Name: [name] [name] MRN: [id number] Date of Birth: 1940/03/12  Subjective/Objective:   Pt in with subdural hematoma. He is from home with his spouse.   Was recently d/c with stable subdural hematoma and outpatient f/u.                Action/Plan: Plan is for: left sided craniotomy and right sided burr hole for evacuation of bilateral subdural hematomas today.  CM following for discharge needs, physician orders.     Expected Discharge Date:                  Expected Discharge Plan:     In-House Referral:     Discharge planning Services     Post Acute Care Choice:    Choice offered to:     DME Arranged:    DME Agency:     HH Arranged:    HH Agency:     Status of Service:  In process, will continue to follow  If discussed at Long Length of Stay Meetings, dates discussed:    Additional Comments:  Pollie Friar, RN 02/05/2017, 11:07 AM

## 2017-02-05 NOTE — Anesthesia Procedure Notes (Signed)
Procedure Name: Intubation Date/Time: 02/05/2017 1:14 PM Performed by: Gaylene Brooks, CRNA Pre-anesthesia Checklist: Patient identified, Emergency Drugs available, Suction available and Patient being monitored Patient Re-evaluated:Patient Re-evaluated prior to induction Oxygen Delivery Method: Circle System Utilized Preoxygenation: Pre-oxygenation with 100% oxygen Induction Type: IV induction Ventilation: Mask ventilation without difficulty Laryngoscope Size: Mac and 4 Grade View: Grade II Tube type: Oral Tube size: 7.5 mm Number of attempts: 1 Airway Equipment and Method: Stylet Placement Confirmation: ETT inserted through vocal cords under direct vision,  positive ETCO2 and breath sounds checked- equal and bilateral Secured at: 23 cm Tube secured with: Tape Dental Injury: Teeth and Oropharynx as per pre-operative assessment  Comments: Intubation per Vickii Penna, SRNA

## 2017-02-05 NOTE — Care Management Note (Signed)
Case Management Note  Patient Details  Name: [name] [name] MRN: [id number] Date of Birth: [birth year]  Subjective/Objective:    Pt admitted on 02/04/17 with bilateral SDH with increasing size of midline shift.   Pt s/p Left frontoparietal craniotomy for subdural hematoma, right burr holes for evacuation of subdural hematoma on 02/05/17.  PTA, pt resided at home with spouse.                Action/Plan: Will follow for discharge planning as pt progresses.  PT/OT consults when able to tolerate therapies.   Expected Discharge Date:                  Expected Discharge Plan:     In-House Referral:     Discharge planning Services  CM Consult  Post Acute Care Choice:    Choice offered to:     DME Arranged:    DME Agency:     HH Arranged:    HH Agency:     Status of Service:  In process, will continue to follow  If discussed at Long Length of Stay Meetings, dates discussed:    Additional Comments:  Reinaldo Raddle, RN, BSN  Trauma/Neuro ICU Case Manager [phone]

## 2017-02-05 NOTE — Brief Op Note (Signed)
02/04/2017 - 02/05/2017  2:37 PM  PATIENT:  [name]  76 y.o. male  PRE-OPERATIVE DIAGNOSIS:  Subdural Hematoma  POST-OPERATIVE DIAGNOSIS:  Subdural Hematoma  PROCEDURE:  Left craniotomy, right sided burr holes for evacuation of subdural hematoma  SURGEON:  Surgeon(s) and Role:    * Ditty, Kevan Ny, MD - Primary  PHYSICIAN ASSISTANT:   ASSISTANTS: Ferne Reus, PA-C  ANESTHESIA:   general  EBL:  250 mL   BLOOD ADMINISTERED:none  DRAINS: JP  LOCAL MEDICATIONS USED:  MARCAINE    and LIDOCAINE   SPECIMEN:  No Specimen  DISPOSITION OF SPECIMEN:  N/A  COUNTS:  YES  TOURNIQUET:  * No tourniquets in log *  DICTATION: .Dragon Dictation  PLAN OF CARE: Admit to inpatient   PATIENT DISPOSITION:  PACU - hemodynamically stable.   Delay start of Pharmacological VTE agent (>24hrs) due to surgical blood loss or risk of bleeding: yes

## 2017-02-05 NOTE — Transfer of Care (Signed)
Immediate Anesthesia Transfer of Care Note  Patient: [name] [name]  Procedure(s) Performed: Bilateral Haskell Flirt . Craniotomy for Subdural (N/A )  Patient Location: ICU  Anesthesia Type:General  Level of Consciousness: Patient remains intubated per anesthesia plan  Airway & Oxygen Therapy: Patient remains intubated per anesthesia plan and Patient placed on Ventilator (see vital sign flow sheet for setting)  Post-op Assessment: Report given to RN and Post -op Vital signs reviewed and stable  Post vital signs: Reviewed and stable  Last Vitals:  Vitals:   02/05/17 0056 02/05/17 0400  BP: 140/71 138/79  Pulse: 72 70  Resp: 16 18  Temp: 36.8 C 36.6 C  SpO2: 100% 99%    Last Pain:  Vitals:   02/05/17 0400  TempSrc: Oral  PainSc:          Complications: No apparent anesthesia complications

## 2017-02-05 NOTE — ED Notes (Signed)
Report given to [facility] on 3W; tried calling pt's wife x 2 with no answer

## 2017-02-05 NOTE — Progress Notes (Addendum)
Neurosurgery Progress Note  No issues overnight. Tired this am but otherwise no concerns. No family at bedside.  EXAM:  BP 138/79 (BP Location: Left Arm)   Pulse 70   Temp 97.8 F (36.6 C) (Oral)   Resp 18   Ht 5\' 9"  (1.753 m)   Wt 125.2 kg (276 lb 0.3 oz)   SpO2 99%   BMI 40.76 kg/m   Awake, alert, oriented to self, year. States at [facility] in [place], [place] Speech fluent, appropriate  CN grossly intact  MAEW with good strength No pronator drift  PLAN Stable this am. No family at bedside Will need to undergo evacuation of SDH later today. BJD to see family when present to discuss risks, benefits.   Addendum: Patient will need to undergo left sided craniotomy and right sided burr hole for evacuation of bilateral subdural hematomas. BJD to call wife to discuss risks/benefits and alternatives.

## 2017-02-05 NOTE — Progress Notes (Signed)
No acute events Patient somnolent, unable to provide informed consent I had a long discussion with his family.  I explained the risks, benefits, and alternatives to surgery.  They wish to proceed.

## 2017-02-05 NOTE — Progress Notes (Signed)
PROGRESS NOTE    [name] [name]  PYK:[id number] DOB: 06-Jan-1941 DOA: 02/04/2017 PCP: Timmothy Euler, MD   Brief Narrative:  [name] [name]  is a 76 y.o. 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 He was hospitalized from 11/28-11/30 With bilateral subdural hematoma and mild midline shift. Since he was hemodynamically stable without significant neurological deficit neurosurgery recommended repeat CT in 3 weeks and placed him on Keppra.  His Anticoagulation was discontinued and  patient discharged home. Patient then got hospitalized from 12/30-12/5 with slurred speech, headache and confusion. CT head without any changes. Neurology was consulted and it was suspected that the symptoms were likely due to [place] which was stopped and patient discharged home.  Patient felt fine for a few days but Wife noted patient was increasingly weak needing more assistance to get out of bed and go to the bathroom. He has been increasingly somnolent. He also has had poor appetite since last 2 days and increasingly confused since yesterday.  Patient denies any headache, blurred vision, dizziness, nausea, vomiting, chest pain, shortness of breath, abdominal pain,fevers, chills, dysuria or diarrhea. Denies bladder or bowel incontinence (wife reports that patient could not make it to the bathroom this morning). No head injury or fall at home.  He was brought to the ED and had a repeat CT of the head without contrast showed Increase in the size of left convexity subdural hematoma of 24 mm (from 19 mm previously) with increased midline shift of 9 mm. Size of the right convexity subdural hematoma was unchanged.  Neurosurgery Dr. Ronnald Ramp was consulted by ED physician who recommended transfer to  [facility]  Dr. Cyndy Freeze evaluated today and patient to undergo Left Sided Craniotomy and Right sided [facility] for Evacuation of Bilateral Subdural to be done today.  Assessment & Plan:   Principal Problem:   Subdural hematoma (HCC) Active Problems:   Hypothyroidism   Essential hypertension   HLD (hyperlipidemia)   Paroxysmal atrial fibrillation (HCC)   Pre-diabetes   Acute metabolic encephalopathy   Leukocytosis   Hyponatremia   Somnolence  Bilateral Subdural Hematoma -Bilateral subdural hematoma with increasing size of the left convexity and midline shift. -Head CT w/o Contrast showed Increased size of left convexity subdural hematoma, with likely subacute hemorrhage in the interval since 01/28/2017, now measuring up to 24 mm, previously 19 mm. Worsening rightward midline shift now measures 9 mm.  Unchanged right convexity subdural hematoma. Worsened mass effect on the left lateral ventricle. No hydrocephalus currently, but patient at risk for developing entrapment of the left temporal horn. -Needs evacuation of the Hematoma. Neurosurgery consulted and recommended transfer to [facility] -Admitted to Neuro Telemetry. No focal neurological deficit at present. -Neuro checks every 2 hours. -Gentle IV hydration with Normal Saline at 50 mL/hr. -Clear liquids yesterday. Nothing by mouth after midnight and patient was NPO -Keppra was stopped recently for Encephalopathy and slurred speech.  -Patient to undergo Left Sided Craniotomy and Right sided [facility] for Evacuation of Bilateral Subdrual -Continue to Monitor Hb/Hct; Hb/Hct went from 15.0/45.0 -> 15.5/45.4 -> 14.6/43.3 -Type and Screen   Preoperative Clearance -Based on growth perioperative cardiac risk patient has 0.65%  -Estimated risk for Perioperative myocardial infarction or Cardiac arrest. -Patient was independent and capable of his METs prior to his acute illness.  -EKG is  Negative for acute findings. -Patient is euvolemic on  exam and hemodynamically stable at present. -He does not need further perioperative cardiac workup.  Hypothyroidism -TSH was 1.302 -Continue Levothyroxine 100 mcg po Daily.  Essential Hypertension -Continue Cardizem at 300 mg po Daily (Dose reduced due to bradycardia) -Hold Lisinopril.  HLD (Hyperlipidemia) -Continue Atorvastatin 10 mg po qDaily  Paroxysmal Atrial Fibrillation (HCC) -In sinus rhythm. Anticoagulation discontinued during last hospitalization. -Continue Cardizem at 300 mg po Daily (Dose reduced due to bradycardia) and Flecainide 100 mg p q12h.  Acute Metabolic Encephalopathy/Somnolence -Suspect 2/2 to Above -Continue NeuroChecks per Protocol.  -No signs of Infection; CXR showed Low lung volumes with basilar opacities favored to represent atelectasis. -Urinalysis negative and UDS Negative  -Continue to Monitor closely   Pre-Diabetes/IFG -Hold Metformin.  -Continue to Monitor on Sensitive Novolog SSI q4h -CBG's ranging from 82-105  Leukocytosis  -Likely 2/2 to Subdural Hematoma -WBC went from 10.8 -> 11.5 -> 10.8 -Continue to Monitor for S/Sx of Infection -Repeat CMP in AM   Hyponatremia -Mild and Improved. Na+ went from 134 -> 135 -Was getting IVF at 50 mL/hr -Repeat CMP in AM  DVT prophylaxis: SCDs Code Status: FULL CODE Family Communication: Discussed with Wife and Family at bedside  Disposition Plan: Remain Inpatient for   Consultants:   Neurosurgery Dr. Shanon Brow Jones/Dr. Marland Kitchen Ditty   Procedures:  Left Sided Craniotomy and Right sided [facility] for Evacuation of Bilateral Subdural to be done today   Antimicrobials: Anti-infectives (From admission, onward)   Start     Dose/Rate Route Frequency Ordered Stop   02/05/17 1200  ceFAZolin (ANCEF) 3 g in dextrose 5 % 50 mL IVPB     3 g 130 mL/hr over 30 Minutes Intravenous To Surgery 02/05/17 1032 02/06/17 1200     Subjective: Seen and examined at bedside this AM and was somnolent but  easily arousable. No CP or SOB. He denies any focal deficits.   Objective: Vitals:   02/04/17 2230 02/04/17 2300 02/05/17 0056 02/05/17 0400  BP: 134/80 (!) 150/87 140/71 138/79  Pulse:   72 70  Resp: 17 15 16 18   Temp:   98.2 F (36.8 C) 97.8 F (36.6 C)  TempSrc:   Oral Oral  SpO2:   100% 99%  Weight:   125.2 kg (276 lb 0.3 oz)   Height:   5\' 9"  (1.753 m)     Intake/Output Summary (Last 24 hours) at 02/05/2017 1252 Last data filed at 02/05/2017 0600 Gross per 24 hour  Intake 207.5 ml  Output -  Net 207.5 ml   Filed Weights   02/04/17 1442 02/05/17 0056  Weight: 129.7 kg (286 lb) 125.2 kg (276 lb 0.3 oz)   Examination: Physical Exam:  Constitutional: WN/WD Obese Caucasian Male in NAD and appears calm; He is somnolent but easily arousable Eyes: Lids and conjunctivae normal, sclerae anicteric  ENMT: External Ears, Nose appear normal. Grossly normal hearing. Mucous membranes are moist.  Neck: Appears normal, supple, no cervical masses, normal ROM, no appreciable thyromegaly, no JVD Respiratory: Diminished to auscultation bilaterally, no wheezing, rales, rhonchi or crackles. Normal respiratory effort and patient is not tachypenic. No accessory muscle use.  Cardiovascular: RRR, no murmurs / rubs / gallops. S1 and S2 auscultated. No extremity edema.  Abdomen: Soft, non-tender, non-distended. No masses palpated. No appreciable hepatosplenomegaly. Bowel sounds positive x4.  GU: Deferred. Musculoskeletal: No clubbing / cyanosis of digits/nails. No joint deformity upper and lower extremities.  Skin: No rashes, lesions, ulcers on  a limited skin eval. No induration; Warm and dry.  Neurologic: CN 2-12 grossly intact with no focal deficits but is somnolent. Sensation intact in all 4 Extremities,  Psychiatric: Normal judgment and insight. Somnolent but arousable. Normal mood and flat affect.   Data Reviewed: I have personally reviewed following labs and imaging studies  CBC: Recent  Labs  Lab 02/04/17 1453 02/05/17 0139 02/05/17 0827  WBC 10.8* 11.5* 10.8*  NEUTROABS 7.7  --   --   HGB 15.0 15.5 14.6  HCT 45.0 45.4 43.3  MCV 90.2 88.7 88.4  PLT 242 272 657   Basic Metabolic Panel: Recent Labs  Lab 02/04/17 1453 02/05/17 0139  NA 134* 135  K 3.7 3.7  CL 99* 98*  CO2 24 26  GLUCOSE 118* 102*  BUN 15 12  CREATININE 0.77 0.85  CALCIUM 9.1 9.3   GFR: Estimated Creatinine Clearance: 99.8 mL/min (by C-G formula based on SCr of 0.85 mg/dL). Liver Function Tests: Recent Labs  Lab 02/04/17 1453  AST 19  ALT 26  ALKPHOS 58  BILITOT 0.5  PROT 7.7  ALBUMIN 3.7   No results for input(s): LIPASE, AMYLASE in the last 168 hours. No results for input(s): AMMONIA in the last 168 hours. Coagulation Profile: Recent Labs  Lab 02/04/17 1453  INR 0.99   Cardiac Enzymes: Recent Labs  Lab 02/04/17 1453  TROPONINI <0.03   BNP (last 3 results) No results for input(s): PROBNP in the last 8760 hours. HbA1C: No results for input(s): HGBA1C in the last 72 hours. CBG: Recent Labs  Lab 01/30/17 1150 02/05/17 0134 02/05/17 0406 02/05/17 0758 02/05/17 1040  GLUCAP 87 111* 105* 84 82   Lipid Profile: No results for input(s): CHOL, HDL, LDLCALC, TRIG, CHOLHDL, LDLDIRECT in the last 72 hours. Thyroid Function Tests: Recent Labs    02/04/17 1453  TSH 1.302   Anemia Panel: No results for input(s): VITAMINB12, FOLATE, FERRITIN, TIBC, IRON, RETICCTPCT in the last 72 hours. Sepsis Labs: No results for input(s): PROCALCITON, LATICACIDVEN in the last 168 hours.  Recent Results (from the past 240 hour(s))  Surgical pcr screen     Status: None   Collection Time: 02/05/17  1:18 AM  Result Value Ref Range Status   MRSA, PCR NEGATIVE NEGATIVE Final   Staphylococcus aureus NEGATIVE NEGATIVE Final    Comment: (NOTE) The Xpert SA Assay (FDA approved for NASAL specimens in patients 42 years of age and older), is one component of a comprehensive surveillance  program. It is not intended to diagnose infection nor to guide or monitor treatment.     Radiology Studies: Dg Chest 2 View  Result Date: 02/04/2017 CLINICAL DATA:  Patient with altered mental status. Increasing lethargy. EXAM: CHEST  2 VIEW COMPARISON:  Chest radiograph 06/20/2016 FINDINGS: Monitoring leads overlie the patient. Stable cardiac and mediastinal contours. Low lung volumes. Bibasilar heterogeneous pulmonary opacities. No pleural effusion or pneumothorax. Thoracic spine degenerative changes. IMPRESSION: Low lung volumes with basilar opacities favored to represent atelectasis. Infection not excluded. Electronically Signed   By: Lovey Newcomer M.D.   On: 02/04/2017 15:46   Ct Head Wo Contrast  Result Date: 02/04/2017 CLINICAL DATA:  Altered mental status. Increased lethargy for 3 days. Recent subdural hematoma. EXAM: CT HEAD WITHOUT CONTRAST TECHNIQUE: Contiguous axial images were obtained from the base of the skull through the vertex without intravenous contrast. COMPARISON:  Head CT 01/28/2017 FINDINGS: Brain: There bilateral mixed density subdural hematomas. The right subdural hematoma is unchanged in size measuring approximately  18 mm. The left subdural hematoma is increased and measures 24 mm, previously 19 mm. There are areas of hyperattenuation that are suggestive of a more recent hemorrhagic event. Midline shift is worsened and now measures 9 mm to the right, previously 7 mm, at the level of the foramina of [place]. The mass effect on the ventricles is also worsened. No hydrocephalus or ventricular trapping. The basal cisterns remain patent. No intraparenchymal hemorrhage. Vascular: No hyperdense vessel or unexpected calcification. Skull: Normal. Negative for fracture or focal lesion. Sinuses/Orbits: Trace amount of bilateral mastoid fluid. Normal orbits. Other: None. IMPRESSION: 1. Increased size of left convexity subdural hematoma, with likely subacute hemorrhage in the interval since  01/28/2017, now measuring up to 24 mm, previously 19 mm. Worsening rightward midline shift now measures 9 mm. 2. Unchanged right convexity subdural hematoma. 3. Worsened mass effect on the left lateral ventricle. No hydrocephalus currently, but patient at risk for developing entrapment of the left temporal horn. Critical Value/emergent results were called by telephone at the time of interpretation on 02/04/2017 at 3:46 pm to Dr. Ezequiel Essex , who verbally acknowledged these results. Electronically Signed   By: Ulyses Jarred M.D.   On: 02/04/2017 15:48   Scheduled Meds: . [MAR Hold] atorvastatin  10 mg Oral Daily  . [MAR Hold] diltiazem  300 mg Oral Daily  . [MAR Hold] flecainide  100 mg Oral Q12H  . [MAR Hold] insulin aspart  0-9 Units Subcutaneous Q4H  . [MAR Hold] levothyroxine  100 mcg Oral QAC breakfast   Continuous Infusions: . sodium chloride 50 mL/hr at 02/05/17 1133  .  ceFAZolin (ANCEF) IV       LOS: 1 day   Kerney Elbe, DO [facility] Pager [phone]  If 7PM-7AM, please contact night-coverage [email or web address] Password [email or web address] 02/05/2017, 12:52 PM

## 2017-02-05 NOTE — Consult Note (Signed)
PULMONARY / CRITICAL CARE MEDICINE   Name: [name] [name] MRN: [id number] DOB: 09-03-1940    ADMISSION DATE:  02/04/2017 CONSULTATION DATE:  02/04/2017  REFERRING MD:  Ditty, B  CHIEF COMPLAINT: Consult for perioperative care in a patient status post left frontal parietal craniotomy and right bur hole evacuation of subdural hematomas  HISTORY OF PRESENT ILLNESS:   This is a 76 year old with a history of intermittent atrial fibrillation for which he was formerly taking Coumadin as well as glucose intolerance hypertension and hypothyroidism who had a minor trauma prior to 1128.  Admitted on 11/28 observed after the finding of bilateral subdural hematomas at which time his Coumadin was discontinued.  He returned for headaches on 12 3 and was observed until 12/5 and again discharged home he was readmitted 12/10 complaining of fatigue.  CT showed increasing subdural hematomas and he was taken electively to the operating room earlier today.  He arrives in the intensive care unit still pharmacologically paralyzed and sedated intubated and mechanically ventilated.  Difficult airway is reported  PAST MEDICAL HISTORY :  He  has a past medical history of Arthritis, Atrial fibrillation ([place]), Cataract, Hyperlipidemia, HYPERTENSION, HYPOTHYROIDISM, Legally blind in left eye, as defined in [place], Pre-diabetes, Precancerous skin lesion, ROTATOR CUFF TEAR, and Subdural hematoma ([place]).  PAST SURGICAL HISTORY: He  has a past surgical history that includes Appendectomy; Rotator cuff repair (Left); Knee surgery; Shoulder surgery; Total knee arthroplasty (Bilateral); Cataract extraction w/PHACO (Right, 02/02/2014); Cataract extraction w/PHACO (Left, 02/16/2014); Eye surgery (2015); and Joint replacement (2009).  No Known Allergies  No current facility-administered medications on file prior to encounter.    Current Outpatient Medications on File Prior to Encounter  Medication Sig  . acetaminophen-codeine (TYLENOL  #3) 300-30 MG tablet Take 2 tablets by mouth every 4 (four) hours as needed for moderate pain.  Marland Kitchen albuterol (PROVENTIL HFA;VENTOLIN HFA) 108 (90 Base) MCG/ACT inhaler Inhale 2 puffs into the lungs every 6 (six) hours as needed for wheezing or shortness of breath.  Marland Kitchen atorvastatin (LIPITOR) 10 MG tablet TAKE 1 TABLET (10 MG TOTAL) BY MOUTH DAILY.  Marland Kitchen diltiazem (CARDIZEM CD) 360 MG 24 hr capsule TAKE 1 CAPSULE (360 MG TOTAL) BY MOUTH DAILY.  . flecainide (TAMBOCOR) 100 MG tablet TAKE 1 TABLET (100 MG TOTAL) BY MOUTH 2 (TWO) TIMES DAILY.  Marland Kitchen levothyroxine (SYNTHROID, LEVOTHROID) 100 MCG tablet TAKE 1 TABLET (100 MCG TOTAL) BY MOUTH DAILY.  Marland Kitchen lisinopril (PRINIVIL,ZESTRIL) 20 MG tablet TAKE 1 TABLET (20 MG TOTAL) BY MOUTH DAILY.  . metFORMIN (GLUCOPHAGE-XR) 500 MG 24 hr tablet TAKE 1 TABLET (500 MG TOTAL) BY MOUTH DAILY WITH BREAKFAST.  Marland Kitchen acetaminophen (TYLENOL) 500 MG tablet Take 1,000 mg by mouth every 6 (six) hours as needed for moderate pain.    FAMILY HISTORY:  His indicated that his mother is deceased. He indicated that his father is deceased. He indicated that his sister is alive. He indicated that only one of his two brothers is alive.   SOCIAL HISTORY: He  reports that  has never smoked. he has never used smokeless tobacco. He reports that he does not drink alcohol or use drugs.  REVIEW OF SYSTEMS:   Not obtainable  SUBJECTIVE:  Not obtainable  VITAL SIGNS: BP (!) 178/94   Pulse 96   Temp 97.8 F (36.6 C) (Oral)   Resp 18   Ht 5\' 9"  (1.753 m)   Wt 276 lb 0.3 oz (125.2 kg)   SpO2 99%   BMI 40.76 kg/m  HEMODYNAMICS:    VENTILATOR SETTINGS: Vent Mode: PRVC FiO2 (%):  [100 %] 100 % Set Rate:  [18 bmp] 18 bmp Vt Set:  [560 mL] 560 mL PEEP:  [5 cmH20] 5 cmH20 Plateau Pressure:  [15 cmH20] 15 cmH20  INTAKE / OUTPUT: I/O last 3 completed shifts: In: 207.5 [I.V.:207.5] Out: -   PHYSICAL EXAMINATION: General: Somewhat obese male who appears his stated age  neuro: Still  flaccid from part pharmacologic paralysis, his pupils are equal at 2 mm. HEENT: Is a short thick neck. Cardiovascular: S1 and S2 are currently regular and distant without murmur rub or gallop. Lungs: There is symmetric air movement and no wheezes. Abdomen: The abdomen is obese without pain or megaly masses tenderness guarding or rebound.     LABS:  BMET Recent Labs  Lab 02/04/17 1453 02/05/17 0139  NA 134* 135  K 3.7 3.7  CL 99* 98*  CO2 24 26  BUN 15 12  CREATININE 0.77 0.85  GLUCOSE 118* 102*    Electrolytes Recent Labs  Lab 02/04/17 1453 02/05/17 0139  CALCIUM 9.1 9.3    CBC Recent Labs  Lab 02/04/17 1453 02/05/17 0139 02/05/17 0827  WBC 10.8* 11.5* 10.8*  HGB 15.0 15.5 14.6  HCT 45.0 45.4 43.3  PLT 242 272 276    Coag's Recent Labs  Lab 02/04/17 1453  INR 0.99    Sepsis Markers No results for input(s): LATICACIDVEN, PROCALCITON, O2SATVEN in the last 168 hours.  ABG No results for input(s): PHART, PCO2ART, PO2ART in the last 168 hours.  Liver Enzymes Recent Labs  Lab 02/04/17 1453  AST 19  ALT 26  ALKPHOS 58  BILITOT 0.5  ALBUMIN 3.7    Cardiac Enzymes Recent Labs  Lab 02/04/17 1453  TROPONINI <0.03    Glucose Recent Labs  Lab 01/30/17 0751 01/30/17 1150 02/05/17 0134 02/05/17 0406 02/05/17 0758 02/05/17 1040  GLUCAP 101* 87 111* 105* 84 82    Imaging Dg Chest 2 View  Result Date: 02/04/2017 CLINICAL DATA:  Patient with altered mental status. Increasing lethargy. EXAM: CHEST  2 VIEW COMPARISON:  Chest radiograph 06/20/2016 FINDINGS: Monitoring leads overlie the patient. Stable cardiac and mediastinal contours. Low lung volumes. Bibasilar heterogeneous pulmonary opacities. No pleural effusion or pneumothorax. Thoracic spine degenerative changes. IMPRESSION: Low lung volumes with basilar opacities favored to represent atelectasis. Infection not excluded. Electronically Signed   By: Lovey Newcomer M.D.   On: 02/04/2017 15:46    Ct Head Wo Contrast  Result Date: 02/04/2017 CLINICAL DATA:  Altered mental status. Increased lethargy for 3 days. Recent subdural hematoma. EXAM: CT HEAD WITHOUT CONTRAST TECHNIQUE: Contiguous axial images were obtained from the base of the skull through the vertex without intravenous contrast. COMPARISON:  Head CT 01/28/2017 FINDINGS: Brain: There bilateral mixed density subdural hematomas. The right subdural hematoma is unchanged in size measuring approximately 18 mm. The left subdural hematoma is increased and measures 24 mm, previously 19 mm. There are areas of hyperattenuation that are suggestive of a more recent hemorrhagic event. Midline shift is worsened and now measures 9 mm to the right, previously 7 mm, at the level of the foramina of [place]. The mass effect on the ventricles is also worsened. No hydrocephalus or ventricular trapping. The basal cisterns remain patent. No intraparenchymal hemorrhage. Vascular: No hyperdense vessel or unexpected calcification. Skull: Normal. Negative for fracture or focal lesion. Sinuses/Orbits: Trace amount of bilateral mastoid fluid. Normal orbits. Other: None. IMPRESSION: 1. Increased size of left convexity subdural hematoma, with  likely subacute hemorrhage in the interval since 01/28/2017, now measuring up to 24 mm, previously 19 mm. Worsening rightward midline shift now measures 9 mm. 2. Unchanged right convexity subdural hematoma. 3. Worsened mass effect on the left lateral ventricle. No hydrocephalus currently, but patient at risk for developing entrapment of the left temporal horn. Critical Value/emergent results were called by telephone at the time of interpretation on 02/04/2017 at 3:46 pm to Dr. Ezequiel Essex , who verbally acknowledged these results. Electronically Signed   By: Ulyses Jarred M.D.   On: 02/04/2017 15:48     DISCUSSION: This is a 76 year old with a history of intermittent atrial fibrillation, hypertension, hypothyroidism, who is  status post bilateral subdural hematoma drainage.  I have discussed blood pressure goals with neurosurgery who will find a systolic of 485 acceptable.  At their request I will be keeping him intubated overnight at least until he demonstrates hemodynamic and neurologic stability.    ASSESSMENT / PLAN:  PULMONARY A: Intubated primarily for airway protection.  Although there is not a report of COPD in the history he is on inhaler at home and I have ordered inhaled bronchodilators.  I have ordered a blood gas in order to ensure that his PCO2 is appropriate for a neurosurgical patient.  CARDIOVASCULAR A: I will be adding a Cardizem infusion for blood pressure control to substitute for the Cardizem he normally takes at home.  Should blood pressure be problematic I will add a as needed dose of labetalol.  ENDOCRINE A: A TSH has been ordered for the morning as he has a history of hypothyroidism 32 minutes has been spent in the care of this patient today  Lars Masson, MD Pulmonary and [place] Pager: [phone]  02/05/2017, 3:27 PM

## 2017-02-05 NOTE — Anesthesia Postprocedure Evaluation (Signed)
Anesthesia Post Note  Patient: [name] [name]  Procedure(s) Performed: Bilateral Haskell Flirt . Craniotomy for Subdural (N/A )     Patient location during evaluation: ICU Anesthesia Type: General Level of consciousness: patient remains intubated per anesthesia plan Pain management: pain level controlled Vital Signs Assessment: post-procedure vital signs reviewed and stable Respiratory status: patient on ventilator - see flowsheet for VS Cardiovascular status: tachycardic Postop Assessment: no apparent nausea or vomiting Anesthetic complications: no    Last Vitals:  Vitals:   02/05/17 0400 02/05/17 1515  BP: 138/79 (!) 178/94  Pulse: 70 96  Resp: 18 18  Temp: 36.6 C   SpO2: 99% 99%    Last Pain:  Vitals:   02/05/17 0400  TempSrc: Oral  PainSc:    Pain Goal:                 Barry Culverhouse JR,JOHN Sherrill Buikema

## 2017-02-05 NOTE — Anesthesia Preprocedure Evaluation (Signed)
Anesthesia Evaluation  Patient identified by MRN, date of birth, ID band Patient unresponsive    Reviewed: Allergy & Precautions, NPO status , Patient's Chart, lab work & pertinent test results  Airway Mallampati: III       Dental  (+) Poor Dentition   Pulmonary    Pulmonary exam normal breath sounds clear to auscultation       Cardiovascular hypertension, Pt. on medications Normal cardiovascular exam Rhythm:Regular Rate:Normal     Neuro/Psych negative psych ROS   GI/Hepatic negative GI ROS, Neg liver ROS,   Endo/Other  Hypothyroidism Morbid obesity  Renal/GU negative Renal ROS  negative genitourinary   Musculoskeletal   Abdominal (+) + obese,   Peds  Hematology   Anesthesia Other Findings   Reproductive/Obstetrics                             Anesthesia Physical Anesthesia Plan  ASA: III  Anesthesia Plan: General   Post-op Pain Management:    Induction: Intravenous  PONV Risk Score and Plan: 3 and Ondansetron, Dexamethasone and Midazolam  Airway Management Planned: Oral ETT  Additional Equipment:   Intra-op Plan:   Post-operative Plan: Extubation in OR and Possible Post-op intubation/ventilation  Informed Consent: I have reviewed the patients History and Physical, chart, labs and discussed the procedure including the risks, benefits and alternatives for the proposed anesthesia with the patient or authorized representative who has indicated his/her understanding and acceptance.   Dental advisory given  Plan Discussed with: CRNA and Surgeon  Anesthesia Plan Comments:         Anesthesia Quick Evaluation

## 2017-02-05 NOTE — Op Note (Signed)
02/05/2017  2:57 PM  PATIENT:  [name] [name]  76 y.o. 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  I recommended the above procedure. The patient's family understood the risks, benefits, and alternatives and potential outcomes and wished to proceed.  PROCEDURE DETAILS: After smooth induction of general endotracheal anesthesia the patient was fixated to the bed in [place] pins with his head in the neutral position. The head was clipped of hair. It was wiped down with alcohol. Lidocaine and Marcaine with epinephrine was injected along the area of the planned incisions. The patient was prepped and draped in the usual sterile fashion.  A linear incision was made. The galea was sharply incised. Raney clips were applied.  The pericranium was reflected inferiorly with the temporalis muscle.   Two burr holes were drilled, one anteriorly and one posteriorly.  The dura was separated from the inner table skull using blunt dissection. Using a footplate and sidecutting bur a craniotomy was fashioned between the bur holes.  The bone flap was further separated from the dura and was removed from the operative field and put on the table.  The dura was opened sharply and reflected. There was dark hematoma under pressure. This was irrigated away. I irrigated under the reflected dural edges. The brain was depressed with a malleable brain retractor and further hematoma was aspirated. There was no active bleeding. I was satisfied that  there was complete hematoma evacuation and there was no continued intracranial bleeding. I irrigated vigorously with bacitracin saline.  The dural edges were coagulated.  A large piece of dry gel foam was placed over the dural defect. The bone flap was secured to the skull with titanium fixation plates. We irrigated again.  Two incisions were made on the right side above the temporalis muscle.  The pericranium was incised and scraped away to expose the skull.  A burr hole was drilled at each incision.  The dura was cauterized and opened sharply.  There was dark fluid but it was not under a lot of pressure.  It was irrigated with bacitracin saline.  I then used a 15 blade to open the membrane on the surface of the brain and it was elevated with a nerve hook.  I irrigated below this layer which caused it to float to the surface of the other burr hole where it was cauterized.  Several liters of irrigation were used to flush the subdural space.  A JP drain was then inserted in the posterior burr hole and fed anteriorly.  It was tunneled out the skin posteriorly.  I irrigated again.  The wounds were closed in routine anatomic layers using absorbable sutures. The skin was closed with staples. A sterile dressing was applied.  The patient was taken out of [place] pins and moved to the stretcher and then taken to PACU.  PATIENT DISPOSITION:  ICU - intubated and hemodynamically stable.   Delay start of Pharmacological VTE agent (>24hrs) due to surgical blood loss or risk of bleeding:  yes

## 2017-02-06 ENCOUNTER — Encounter (HOSPITAL_COMMUNITY): Payer: Self-pay | Admitting: Neurological Surgery

## 2017-02-06 ENCOUNTER — Inpatient Hospital Stay (HOSPITAL_COMMUNITY): Payer: PPO

## 2017-02-06 LAB — CBC WITH DIFFERENTIAL/PLATELET
BASOS PCT: 0 %
Basophils Absolute: 0 10*3/uL (ref 0.0–0.1)
EOS PCT: 0 %
Eosinophils Absolute: 0 10*3/uL (ref 0.0–0.7)
HEMATOCRIT: 39.5 % (ref 39.0–52.0)
Hemoglobin: 13.4 g/dL (ref 13.0–17.0)
LYMPHS PCT: 8 %
Lymphs Abs: 1.1 10*3/uL (ref 0.7–4.0)
MCH: 30 pg (ref 26.0–34.0)
MCHC: 33.9 g/dL (ref 30.0–36.0)
MCV: 88.4 fL (ref 78.0–100.0)
MONO ABS: 0.5 10*3/uL (ref 0.1–1.0)
MONOS PCT: 4 %
NEUTROS ABS: 11.7 10*3/uL — AB (ref 1.7–7.7)
Neutrophils Relative %: 88 %
Platelets: 265 10*3/uL (ref 150–400)
RBC: 4.47 MIL/uL (ref 4.22–5.81)
RDW: 13.2 % (ref 11.5–15.5)
WBC: 13.3 10*3/uL — ABNORMAL HIGH (ref 4.0–10.5)

## 2017-02-06 LAB — COMPREHENSIVE METABOLIC PANEL
ALT: 19 U/L (ref 17–63)
ANION GAP: 9 (ref 5–15)
AST: 15 U/L (ref 15–41)
Albumin: 2.7 g/dL — ABNORMAL LOW (ref 3.5–5.0)
Alkaline Phosphatase: 51 U/L (ref 38–126)
BILIRUBIN TOTAL: 0.5 mg/dL (ref 0.3–1.2)
BUN: 11 mg/dL (ref 6–20)
CO2: 21 mmol/L — ABNORMAL LOW (ref 22–32)
Calcium: 8.5 mg/dL — ABNORMAL LOW (ref 8.9–10.3)
Chloride: 105 mmol/L (ref 101–111)
Creatinine, Ser: 0.73 mg/dL (ref 0.61–1.24)
Glucose, Bld: 156 mg/dL — ABNORMAL HIGH (ref 65–99)
POTASSIUM: 3.7 mmol/L (ref 3.5–5.1)
Sodium: 135 mmol/L (ref 135–145)
TOTAL PROTEIN: 6.5 g/dL (ref 6.5–8.1)

## 2017-02-06 LAB — TRIGLYCERIDES: TRIGLYCERIDES: 67 mg/dL (ref <150)

## 2017-02-06 LAB — GLUCOSE, CAPILLARY
GLUCOSE-CAPILLARY: 121 mg/dL — AB (ref 65–99)
GLUCOSE-CAPILLARY: 127 mg/dL — AB (ref 65–99)
GLUCOSE-CAPILLARY: 127 mg/dL — AB (ref 65–99)
GLUCOSE-CAPILLARY: 128 mg/dL — AB (ref 65–99)
GLUCOSE-CAPILLARY: 141 mg/dL — AB (ref 65–99)
GLUCOSE-CAPILLARY: 162 mg/dL — AB (ref 65–99)

## 2017-02-06 LAB — TSH: TSH: 0.648 u[IU]/mL (ref 0.350–4.500)

## 2017-02-06 LAB — PHOSPHORUS: PHOSPHORUS: 3.4 mg/dL (ref 2.5–4.6)

## 2017-02-06 LAB — MAGNESIUM: MAGNESIUM: 1.9 mg/dL (ref 1.7–2.4)

## 2017-02-06 NOTE — Progress Notes (Addendum)
   PROGRESS NOTE  [name] [name] PVV:[id number] DOB: 19-Dec-1940 DOA: 02/04/2017 PCP: Timmothy Euler, MD  HPI/Recap of past 24 hours: [name] is a 76 yo male with PMH significant for chronic a-fib previously on coumadin who presented on 02/04/17 with complaints of increasing fatigue pt was found to have a left subacute/chronic subdural hematoma that had increased in size and stable right subdural hematoma. He was taken electively for neurosurgery and is POD# 1 post left frontoparietal craniotomy for subdural hematoma and right burr holes for evacuation of subdural hematoma.  No reported events overnight. Ct head with no contrast 02/06/17: IMPRESSION: 1. Postoperative changes from interval bilateral subdural evacuation with right-sided subdural drain in place. 2. Interval decrease in size of bilateral mixed density subdural hematoma is, left slightly larger than right. Persistent mass effect with 8 mm of left-to-right shift. No hydrocephalus or ventricular trapping. 3. No other new acute intracranial abnormality.  02/06/17: Patient is still intubated and on invasive mechanical ventilation. Followed by the critical care medicine team.  TRH will sign off for now. Re-consult when needed.       LOS: 2 days     Kayleen Memos, MD [facility] Pager [phone]  If 7PM-7AM, please contact night-coverage [email or web address] Password [email or web address] 02/06/2017, 7:12 AM

## 2017-02-06 NOTE — Procedures (Signed)
Extubation Procedure Note  Patient Details:   Name: [name] [name] DOB: Dec 08, 1940 MRN: [id number]   Airway Documentation:  Airway 7.5 mm (Active)  Secured at (cm) 24 cm 02/06/2017 11:15 AM  Measured From Lips 02/06/2017 11:15 AM  Secured Location Right 02/06/2017 11:15 AM  Secured By [employer] 02/06/2017 11:15 AM  Tube Holder Repositioned Yes 02/06/2017 11:15 AM  Cuff Pressure (cm H2O) 26 cm H2O 02/05/2017  7:51 PM  Site Condition Dry 02/06/2017 11:15 AM    Evaluation  O2 sats: stable throughout Complications: No apparent complications Patient did tolerate procedure well. Bilateral Breath Sounds: Clear   Yes   Patient was extubated to a 4L [place] without any complications, dyspnea or stridor noted. Patient was instructed on IS x 5, highest goal reached was 1732mL.  Claretta Fraise 02/06/2017, 11:58 PM

## 2017-02-06 NOTE — Progress Notes (Signed)
OT Cancellation Note  Patient Details Name: [name] [name] MRN: [id number] DOB: 1940/03/29   Cancelled Treatment:    Reason Eval/Treat Not Completed: Patient not medically ready. Pt currently intubated and sedated. Will check back as able to initiate OT evaluation.   Norman Herrlich, MS OTR/L  Pager: [phone]   Norman Herrlich 02/06/2017, 6:36 AM

## 2017-02-06 NOTE — Progress Notes (Signed)
PT Cancellation Note  Patient Details Name: [name] [name] MRN: [id number] DOB: December 05, 1940   Cancelled Treatment:    Reason Eval/Treat Not Completed: Patient not medically ready. Pt on strict bed rest until 1600 on 12/13. PT to return as able once activity orders advanced.    Shacarra Choe M Tallen Schnorr 02/06/2017, 8:09 AM   Kittie Plater, PT, DPT Pager #: [phone] Office #: [phone]

## 2017-02-06 NOTE — Progress Notes (Signed)
PULMONARY / CRITICAL CARE MEDICINE   Name: OREE HISLOP MRN: [id number] DOB: 1940/07/15    ADMISSION DATE:  02/04/2017    CHIEF COMPLAINT: Fatigue and lethargy, with finding of enlarging bilateral subdural hematomas  HISTORY OF PRESENT ILLNESS:        This is a 76 year old with a history of intermittent atrial fibrillation for which he was formerly taking Coumadin as well as glucose intolerance,hypertension, and hypothyroidism who had a minor trauma prior to 11/28.  Admitted on 11/28 observed after the finding of bilateral subdural hematomas at which time his Coumadin was discontinued.  He returned for headaches on 12 /3 and was observed until 12/5 and again discharged home.He was readmitted 12/10 complaining of fatigue.  CT showed increasing subdural hematomas and he was taken electively to the operating room 12/11.  He remains orally intubated and mechanically ventilated this morning but despite propofol sedation he is alert and following instructions. Difficult airway is reported    PAST MEDICAL HISTORY :  He  has a past medical history of Arthritis, Atrial fibrillation ([place]), Cataract, Hyperlipidemia, HYPERTENSION, HYPOTHYROIDISM, Legally blind in left eye, as defined in [place], Pre-diabetes, Precancerous skin lesion, ROTATOR CUFF TEAR, and Subdural hematoma ([place]).  PAST SURGICAL HISTORY: He  has a past surgical history that includes Appendectomy; Rotator cuff repair (Left); Knee surgery; Shoulder surgery; Total knee arthroplasty (Bilateral); Cataract extraction w/PHACO (Right, 02/02/2014); Cataract extraction w/PHACO (Left, 02/16/2014); Eye surgery (2015); and Joint replacement (2009).  No Known Allergies  No current facility-administered medications on file prior to encounter.    Current Outpatient Medications on File Prior to Encounter  Medication Sig  . acetaminophen-codeine (TYLENOL #3) 300-30 MG tablet Take 2 tablets by mouth every 4 (four) hours as needed for moderate pain.  Marland Kitchen  albuterol (PROVENTIL HFA;VENTOLIN HFA) 108 (90 Base) MCG/ACT inhaler Inhale 2 puffs into the lungs every 6 (six) hours as needed for wheezing or shortness of breath.  Marland Kitchen atorvastatin (LIPITOR) 10 MG tablet TAKE 1 TABLET (10 MG TOTAL) BY MOUTH DAILY.  Marland Kitchen diltiazem (CARDIZEM CD) 360 MG 24 hr capsule TAKE 1 CAPSULE (360 MG TOTAL) BY MOUTH DAILY.  . flecainide (TAMBOCOR) 100 MG tablet TAKE 1 TABLET (100 MG TOTAL) BY MOUTH 2 (TWO) TIMES DAILY.  Marland Kitchen levothyroxine (SYNTHROID, LEVOTHROID) 100 MCG tablet TAKE 1 TABLET (100 MCG TOTAL) BY MOUTH DAILY.  Marland Kitchen lisinopril (PRINIVIL,ZESTRIL) 20 MG tablet TAKE 1 TABLET (20 MG TOTAL) BY MOUTH DAILY.  . metFORMIN (GLUCOPHAGE-XR) 500 MG 24 hr tablet TAKE 1 TABLET (500 MG TOTAL) BY MOUTH DAILY WITH BREAKFAST.  Marland Kitchen acetaminophen (TYLENOL) 500 MG tablet Take 1,000 mg by mouth every 6 (six) hours as needed for moderate pain.    FAMILY HISTORY:  His indicated that his mother is deceased. He indicated that his father is deceased. He indicated that his sister is alive. He indicated that only one of his two brothers is alive.   SOCIAL HISTORY: He  reports that  has never smoked. he has never used smokeless tobacco. He reports that he does not drink alcohol or use drugs.  REVIEW OF SYSTEMS:   Not obtainable  SUBJECTIVE:  Not obtainable  VITAL SIGNS: BP (!) 157/77   Pulse 67   Temp (!) 97.3 F (36.3 C) (Axillary)   Resp 12   Ht 5\' 9"  (1.753 m)   Wt 272 lb 11.3 oz (123.7 kg)   SpO2 99%   BMI 40.27 kg/m   HEMODYNAMICS:    VENTILATOR SETTINGS: Vent Mode: PSV;CPAP FiO2 (%):  [  40 %-100 %] 40 % Set Rate:  [18 bmp] 18 bmp Vt Set:  [560 mL] 560 mL PEEP:  [5 cmH20] 5 cmH20 Pressure Support:  [12 cmH20] 12 cmH20 Plateau Pressure:  [15 cmH20-18 cmH20] 16 cmH20  INTAKE / OUTPUT: I/O last 3 completed shifts: In: 3766.6 [I.V.:3561.6; IV Piggyback:205] Out: 2330 [Urine:1930; Drains:150; Blood:250]  PHYSICAL EXAMINATION: General: This is a somewhat obese elderly male  with a short thick neck who is orally intubated and mechanically ventilated and in no acute distress Neuro: He is nodding to questions for me and following simple instructions.  He moves all fours on request.  Pupils are equal and his face is symmetric. Cardiovascular: S1 and S2 is regular at present without murmur rub or gallop. Lungs: Aspirations are unlabored on a pressure support of 12, there is symmetric air movement some scattered wheezes and no rhonchi. Abdomen: Abdomen is obese and soft without any organomegaly masses tenderness guarding or rebound. LABS:  BMET Recent Labs  Lab 02/04/17 1453 02/05/17 0139 02/06/17 0634  NA 134* 135 135  K 3.7 3.7 3.7  CL 99* 98* 105  CO2 24 26 21*  BUN 15 12 11   CREATININE 0.77 0.85 0.73  GLUCOSE 118* 102* 156*    Electrolytes Recent Labs  Lab 02/04/17 1453 02/05/17 0139 02/06/17 0634  CALCIUM 9.1 9.3 8.5*  MG  --   --  1.9  PHOS  --   --  3.4    CBC Recent Labs  Lab 02/05/17 0827 02/05/17 2315 02/06/17 0634  WBC 10.8* 11.6* 13.3*  HGB 14.6 13.7 13.4  HCT 43.3 39.9 39.5  PLT 276 263 265    Coag's Recent Labs  Lab 02/04/17 1453  INR 0.99    Sepsis Markers No results for input(s): LATICACIDVEN, PROCALCITON, O2SATVEN in the last 168 hours.  ABG Recent Labs  Lab 02/05/17 1621  PHART 7.373  PCO2ART 41.8  PO2ART 320.0*    Liver Enzymes Recent Labs  Lab 02/04/17 1453 02/06/17 0634  AST 19 15  ALT 26 19  ALKPHOS 58 51  BILITOT 0.5 0.5  ALBUMIN 3.7 2.7*    Cardiac Enzymes Recent Labs  Lab 02/04/17 1453  TROPONINI <0.03    Glucose Recent Labs  Lab 02/05/17 1040 02/05/17 1557 02/05/17 1937 02/05/17 2339 02/06/17 0350 02/06/17 0804  GLUCAP 82 142* 136* 177* 162* 128*    Imaging Ct Head Wo Contrast  Result Date: 02/06/2017 CLINICAL DATA:  Follow-up examination for subdural hemorrhage. EXAM: CT HEAD WITHOUT CONTRAST TECHNIQUE: Contiguous axial images were obtained from the base of the skull  through the vertex without intravenous contrast. COMPARISON:  Prior CT from 02/04/2017. FINDINGS: Brain: Postoperative changes from interval bilateral subdural evacuation. There has been performance of a craniotomy on the left. 2 burr holes in place at the right calvarium. A right-sided subdural drain traverses the posterior burr hole, with tip overlying the right frontal convexity. Previously seen mixed density subdural hematomas are decreased in size from previous. Left subdural measures up to 12 mm in maximal thickness at the level of the left frontal convexity. The right subdural collection now measures up to 8 mm in maximal thickness. Associated parafalcine and left tentorial extension noted. Postoperative pneumocephalus with hyperdense blood products seen bilaterally, left slightly greater than right. Persistent but improved mass effect on the bilateral cerebral hemispheres, with persistent 8 mm of left-to-right shift. No hydrocephalus or ventricular trapping. Basilar cisterns remain patent. No other acute intracranial hemorrhage. No acute large vessel territory infarct. No  mass lesion. Vascular: No hyperdense vessel. Scattered vascular calcifications noted within the carotid siphons. Skull: Postoperative changes present at the bilateral calvarium. Overlying soft tissue swelling with emphysema. Skin staples in place. Sinuses/Orbits: Globes and orbital soft tissues within normal limits. Other: Scattered mucosal thickening within the ethmoid air cells. Bilateral maxillary sinus retention cyst noted. Bilateral mastoid effusions, right greater than left. IMPRESSION: 1. Postoperative changes from interval bilateral subdural evacuation with right-sided subdural drain in place. 2. Interval decrease in size of bilateral mixed density subdural hematoma is, left slightly larger than right. Persistent mass effect with 8 mm of left-to-right shift. No hydrocephalus or ventricular trapping. 3. No other new acute  intracranial abnormality. Electronically Signed   By: Jeannine Boga M.D.   On: 02/06/2017 06:40   Dg Chest Port 1 View  Result Date: 02/05/2017 CLINICAL DATA:  Intubation. EXAM: PORTABLE CHEST 1 VIEW COMPARISON:  02/04/2017. FINDINGS: Endotracheal tube noted with its tip approximately 3.8 cm above the carina. Heart size stable. Low lung volumes with mild basilar atelectasis. No pleural effusion or pneumothorax. IMPRESSION: 1. Endotracheal tube noted with tip 3.8 cm above the carina. 2. Low lung volumes with mild basilar atelectasis . Electronically Signed   By: Marcello Moores  Register   On: 02/05/2017 16:07      DISCUSSION: This is a 76 year old with a history of glucose intolerance, hypothyroidism, and paroxysmal atrial fibrillation who is status post drainage of bilateral subdural hematomas.  He is alert postoperatively  ASSESSMENT / PLAN:  PULMONARY A: He is intubated solely for airway protection following sedation.  I am anticipating moving towards extubation today.  I am aware that he has a difficult airway.  CARDIOVASCULAR A: He is currently maintaining sinus rhythm on flecainide.  Cardizem is not being used for an AV blocking agent but is an antihypertensive which she normally takes at home.  ENDOCRINE A: Sliding scale insulin is in place for coverage of glucose. TSH this morning is normal  NEUROLOGIC A: Does not have gross neurological deficits following bilateral subdural drainage he is intubated and I cannot do an adequate evaluation of cognition.   Greater than 32 minutes was spent in the care of this patient today   Lars Masson, MD [facility] [place] Pager: [phone]  02/06/2017, 10:19 AM

## 2017-02-06 NOTE — Progress Notes (Signed)
Pt seen and examined. No issues overnight.   EXAM: Temp:  [97.4 F (36.3 C)-97.7 F (36.5 C)] 97.6 F (36.4 C) (12/12 0400) Pulse Rate:  [48-96] 64 (12/12 0800) Resp:  [0-22] 9 (12/12 0800) BP: (101-179)/(57-94) 145/70 (12/12 0800) SpO2:  [97 %-100 %] 99 % (12/12 0800) Arterial Line BP: (75-195)/(47-96) 174/67 (12/12 0800) FiO2 (%):  [40 %-100 %] 40 % (12/12 0800) Weight:  [123.7 kg (272 lb 11.3 oz)] 123.7 kg (272 lb 11.3 oz) (12/11 1515) Intake/Output      12/11 0701 - 12/12 0700 12/12 0701 - 12/13 0700   I.V. (mL/kg) 3354.1 (27.1) 113.6 (0.9)   IV Piggyback 205    Total Intake(mL/kg) 3559.1 (28.8) 113.6 (0.9)   Urine (mL/kg/hr) 1930 (0.7)    Drains 150    Stool 0    Blood 250    Total Output 2330    Net +1229.1 +113.6        Stool Occurrence 1 x     Awake, alert PERRL Follows commands throughout  LABS: Lab Results  Component Value Date   CREATININE 0.73 02/06/2017   BUN 11 02/06/2017   NA 135 02/06/2017   K 3.7 02/06/2017   CL 105 02/06/2017   CO2 21 (L) 02/06/2017   Lab Results  Component Value Date   WBC 13.3 (H) 02/06/2017   HGB 13.4 02/06/2017   HCT 39.5 02/06/2017   MCV 88.4 02/06/2017   PLT 265 02/06/2017    IMAGING: CT head independently reviewed.  Subdural hematomas much smaller.  Quite a bit of pneumocephalus on the left causes persistent shift.  Overall much improved.  IMPRESSION: - 76 y.o. male with bilateral subdural hematomas s/p evacuation.  Neurologically improved.  PLAN: - Keep flat until noon tomorrow - Drain removed today - Extubate when medically appropriate

## 2017-02-06 NOTE — Progress Notes (Signed)
Transported Pt from 4N 21 to CT and back without any complications.

## 2017-02-07 LAB — BASIC METABOLIC PANEL
ANION GAP: 7 (ref 5–15)
BUN: 14 mg/dL (ref 6–20)
CALCIUM: 8.5 mg/dL — AB (ref 8.9–10.3)
CHLORIDE: 111 mmol/L (ref 101–111)
CO2: 22 mmol/L (ref 22–32)
Creatinine, Ser: 0.75 mg/dL (ref 0.61–1.24)
GFR calc non Af Amer: >60 mL/min (ref >60)
GLUCOSE: 132 mg/dL — AB (ref 65–99)
POTASSIUM: 4 mmol/L (ref 3.5–5.1)
Sodium: 140 mmol/L (ref 135–145)

## 2017-02-07 LAB — GLUCOSE, CAPILLARY
GLUCOSE-CAPILLARY: 112 mg/dL — AB (ref 65–99)
Glucose-Capillary: 138 mg/dL — ABNORMAL HIGH (ref 65–99)
Glucose-Capillary: 133 mg/dL — ABNORMAL HIGH (ref 65–99)
Glucose-Capillary: 140 mg/dL — ABNORMAL HIGH (ref 65–99)

## 2017-02-07 NOTE — Progress Notes (Signed)
Pt seen and examined. No issues overnight.  EXAM: Temp:  [97.9 F (36.6 C)-98.7 F (37.1 C)] 97.9 F (36.6 C) (12/13 0400) Pulse Rate:  [67-92] 70 (12/13 0700) Resp:  [10-17] 12 (12/13 0700) BP: (136-162)/(69-97) 146/75 (12/13 0700) SpO2:  [93 %-100 %] 97 % (12/13 0700) Arterial Line BP: (202-226)/(94-98) 202/94 (12/12 1000) FiO2 (%):  [40 %] 40 % (12/12 1115) Intake/Output      12/12 0701 - 12/13 0700 12/13 0701 - 12/14 0700   P.O. 180    I.V. (mL/kg) 2431.7 (19.7)    IV Piggyback 105    Total Intake(mL/kg) 2716.7 (22)    Urine (mL/kg/hr) 2125 (0.7)    Drains     Stool     Blood     Total Output 2125    Net +591.7          Awake, alert, oriented PERRL Moves arms and legs with full strength Bandages with some dried blood  LABS: Lab Results  Component Value Date   CREATININE 0.75 02/07/2017   BUN 14 02/07/2017   NA 140 02/07/2017   K 4.0 02/07/2017   CL 111 02/07/2017   CO2 22 02/07/2017   Lab Results  Component Value Date   WBC 13.3 (H) 02/06/2017   HGB 13.4 02/06/2017   HCT 39.5 02/06/2017   MCV 88.4 02/06/2017   PLT 265 02/06/2017    IMAGING: No new imaging  IMPRESSION: - 76 y.o. male s/p subdural hematoma evacuation.  He is doing much better neurologically.  PLAN: - Ok to get up and around starting at noon today - May transfer to the floor this afternoon

## 2017-02-07 NOTE — Progress Notes (Signed)
Occupational Therapy Evaluation Patient Details Name: [name] [name] MRN: [id number] DOB: [birth year] Today's Date: 02/07/2017    History of Present Illness 76 yo male with PMH significant for chronic a-fib previously on coumadin who presented on 02/04/17 with complaints of increasing fatigue pt was found to have a left subacute/chronic subdural hematoma. Underwent left frontoparietal craniotomy for subdural hematoma and right burr holes for evacuation of subdural hematoma 02/05/17.    Clinical Impression   PTA, pt lived at home with his wife and was independent with ADL and mobility, drove, enjoyed watching sports and was active with his grandchildren and doing chores around the house. Pt currently requires min A with mobility @ RW level and Mod A with LB ADL.SBP 184 sitting - nsg notified.  Anticipate pt will make good progress and progress to DC home with HHOT to facilitate return to independent level of function with ADL and IADL tasks. Pt will have 24/7 S from supportive family after DC.     Follow Up Recommendations  Home health OT;Supervision/Assistance - 24 hour    Equipment Recommendations  None recommended by OT    Recommendations for Other Services       Precautions / Restrictions Precautions Precautions: Fall Restrictions Weight Bearing Restrictions: No      Mobility Bed Mobility Overal bed mobility: Needs Assistance Bed Mobility: Supine to Sit     Supine to sit: Supervision        Transfers Overall transfer level: Needs assistance Equipment used: Rolling walker (2 wheeled) Transfers: Sit to/from Stand Sit to Stand: Min assist;+2 safety/equipment              Balance Overall balance assessment: Needs assistance   Sitting balance-Leahy Scale: Fair       Standing balance-Leahy Scale: Poor Standing balance comment: appaers reliant on RW at this time                           ADL either performed or assessed with clinical judgement    ADL Overall ADL's : Needs assistance/impaired     Grooming: Set up;Sitting   Upper Body Bathing: Set up;Sitting   Lower Body Bathing: Minimal assistance;Sit to/from stand   Upper Body Dressing : Minimal assistance;Sitting   Lower Body Dressing: Moderate assistance;Sit to/from stand   Toilet Transfer: Minimal assistance;+2 for safety/equipment;RW     Toileting - Clothing Manipulation Details (indicate cue type and reason): foley     Functional mobility during ADLs: +2 for safety/equipment;Minimal assistance;Rolling walker       Vision Baseline Vision/History: No visual deficits       Perception Perception Comments: WFL   Praxis Praxis Praxis tested?: Within functional limits    Pertinent Vitals/Pain Pain Assessment: No/denies pain     Hand Dominance Right   Extremity/Trunk Assessment Upper Extremity Assessment Upper Extremity Assessment: Overall WFL for tasks assessed   Lower Extremity Assessment Lower Extremity Assessment: Defer to PT evaluation   Cervical / Trunk Assessment Cervical / Trunk Assessment: Other exceptions(forward head)   Communication Communication Communication: No difficulties   Cognition Arousal/Alertness: Awake/alert Behavior During Therapy: WFL for tasks assessed/performed Overall Cognitive Status: Within Functional Limits for tasks assessed                                 General Comments: WILL FURTHER ASSESS COGNITION BUT APPAERAS INTACT TFOR TASKS ASSESSED   General Comments  Exercises     Shoulder Instructions      Home Living Family/patient expects to be discharged to:: Private residence Living Arrangements: Spouse/significant other;Other relatives Available Help at Discharge: Family;Available 24 hours/day Type of Home: House Home Access: Ramped entrance     Home Layout: One level     Bathroom Shower/Tub: Occupational psychologist: Handicapped height Bathroom Accessibility: Yes How  Accessible: Accessible via walker Home Equipment: [place] - 2 wheels;Cane - single point;Bedside commode;Shower seat(MAY HAVE OTHER dme)          Prior Functioning/Environment Level of Independence: Independent        Comments: DROVE; LIKES WATCHING SPORTS; ENJOYS HIS GRANDCHILDREN        OT Problem List: Decreased activity tolerance;Impaired balance (sitting and/or standing);Decreased safety awareness;Decreased knowledge of use of DME or AE      OT Treatment/Interventions: Self-care/ADL training;Therapeutic exercise;DME and/or AE instruction;Therapeutic activities;Cognitive remediation/compensation;Patient/family education;Balance training    OT Goals(Current goals can be found in the care plan section) Acute Rehab OT Goals Patient Stated Goal: to go home OT Goal Formulation: With patient Time For Goal Achievement: 02/21/17 Potential to Achieve Goals: Good  OT Frequency: Min 2X/week   Barriers to D/C:            Co-evaluation PT/OT/SLP Co-Evaluation/Treatment: Yes Reason for Co-Treatment: For patient/therapist safety   OT goals addressed during session: ADL's and self-care      AM-PAC PT "6 Clicks" Daily Activity     Outcome Measure Help from another person eating meals?: None Help from another person taking care of personal grooming?: A Little Help from another person toileting, which includes using toliet, bedpan, or urinal?: A Little Help from another person bathing (including washing, rinsing, drying)?: A Little Help from another person to put on and taking off regular upper body clothing?: A Little Help from another person to put on and taking off regular lower body clothing?: A Little 6 Click Score: 19   End of Session Equipment Utilized During Treatment: Gait belt;Rolling walker Nurse Communication: Mobility status(BP 184)  Activity Tolerance: Patient tolerated treatment well Patient left: Other (comment)(in w/c being transported to another unit)  OT  Visit Diagnosis: Unsteadiness on feet (R26.81);Muscle weakness (generalized) (M62.81)                Time: 6269-4854 OT Time Calculation (min): 24 min Charges:  OT General Charges $OT Visit: 1 Visit OT Evaluation $OT Eval Moderate Complexity: 1 Mod G-Codes:     Delmus Warwick, OT/L  [phone] 02/07/2017  Levon Penning,HILLARY 02/07/2017, 4:09 PM

## 2017-02-07 NOTE — Progress Notes (Signed)
PT Cancellation Note  Patient Details Name: KESHAUN DUBEY MRN: [id number] DOB: Feb 27, 1940   Cancelled Treatment:    Reason Eval/Treat Not Completed: Medical issues which prohibited therapy(pt remains on bedrest until 1600 will see as time permits)   Kacey Dysert B Cindra Austad 02/07/2017, 7:22 AM Elwyn Reach, [place]

## 2017-02-07 NOTE — Progress Notes (Signed)
PULMONARY / CRITICAL CARE MEDICINE   Name: [name] [name] MRN: [id number] DOB: January 03, 1941    ADMISSION DATE:  02/04/2017    CHIEF COMPLAINT: Fatigue and lethargy, with finding of enlarging bilateral subdural hematomas  HISTORY OF PRESENT ILLNESS:        This is a 76 year old with a history of intermittent atrial fibrillation for which he was formerly taking Coumadin as well as glucose intolerance,hypertension, and hypothyroidism who had a minor trauma prior to 11/28.  Admitted on 11/28 observed after the finding of bilateral subdural hematomas at which time his Coumadin was discontinued.  He returned for headaches on 12 /3 and was observed until 12/5 and again discharged home.He was readmitted 12/10 complaining of fatigue.  CT showed increasing subdural hematomas and he was taken electively to the operating room 12/11.  He was extubated 12/12. Today he is conversant without specific complaint.    PAST MEDICAL HISTORY :  He  has a past medical history of Arthritis, Atrial fibrillation ([name] [name]), Cataract, Hyperlipidemia, HYPERTENSION, HYPOTHYROIDISM, Legally blind in left eye, as defined in [place], Pre-diabetes, Precancerous skin lesion, ROTATOR CUFF TEAR, and Subdural hematoma ([name] [name]).  PAST SURGICAL HISTORY: He  has a past surgical history that includes Appendectomy; Rotator cuff repair (Left); Knee surgery; Shoulder surgery; Total knee arthroplasty (Bilateral); Cataract extraction w/PHACO (Right, 02/02/2014); Cataract extraction w/PHACO (Left, 02/16/2014); Eye surgery (2015); Joint replacement (2009); and Craniotomy (N/A, 02/05/2017).  No Known Allergies  No current facility-administered medications on file prior to encounter.    Current Outpatient Medications on File Prior to Encounter  Medication Sig  . acetaminophen-codeine (TYLENOL #3) 300-30 MG tablet Take 2 tablets by mouth every 4 (four) hours as needed for moderate pain.  Marland Kitchen albuterol (PROVENTIL HFA;VENTOLIN HFA) 108 (90 Base) MCG/ACT  inhaler Inhale 2 puffs into the lungs every 6 (six) hours as needed for wheezing or shortness of breath.  Marland Kitchen atorvastatin (LIPITOR) 10 MG tablet TAKE 1 TABLET (10 MG TOTAL) BY MOUTH DAILY.  Marland Kitchen diltiazem (CARDIZEM CD) 360 MG 24 hr capsule TAKE 1 CAPSULE (360 MG TOTAL) BY MOUTH DAILY.  . flecainide (TAMBOCOR) 100 MG tablet TAKE 1 TABLET (100 MG TOTAL) BY MOUTH 2 (TWO) TIMES DAILY.  Marland Kitchen levothyroxine (SYNTHROID, LEVOTHROID) 100 MCG tablet TAKE 1 TABLET (100 MCG TOTAL) BY MOUTH DAILY.  Marland Kitchen lisinopril (PRINIVIL,ZESTRIL) 20 MG tablet TAKE 1 TABLET (20 MG TOTAL) BY MOUTH DAILY.  . metFORMIN (GLUCOPHAGE-XR) 500 MG 24 hr tablet TAKE 1 TABLET (500 MG TOTAL) BY MOUTH DAILY WITH BREAKFAST.  Marland Kitchen acetaminophen (TYLENOL) 500 MG tablet Take 1,000 mg by mouth every 6 (six) hours as needed for moderate pain.    FAMILY HISTORY:  His indicated that his mother is deceased. He indicated that his father is deceased. He indicated that his sister is alive. He indicated that only one of his two brothers is alive.   SOCIAL HISTORY: He  reports that  has never smoked. he has never used smokeless tobacco. He reports that he does not drink alcohol or use drugs.  REVIEW OF SYSTEMS:   Not obtainable  SUBJECTIVE:  Not obtainable  VITAL SIGNS: BP (!) 146/75   Pulse 70   Temp 97.9 F (36.6 C) (Oral)   Resp 12   Ht 5\' 9"  (1.753 m)   Wt 272 lb 11.3 oz (123.7 kg)   SpO2 97%   BMI 40.27 kg/m   HEMODYNAMICS:    VENTILATOR SETTINGS: Vent Mode: PSV;CPAP FiO2 (%):  [40 %] 40 % PEEP:  [5 cmH20] 5  cmH20 Pressure Support:  [10 cmH20] 10 cmH20  INTAKE / OUTPUT: I/O last 3 completed shifts: In: 4456 [P.O.:180; I.V.:3966; IV Piggyback:310] Out: 6314 [Urine:3375; Drains:100]  PHYSICAL EXAMINATION: General: This is a somewhat obese elderly male with a short thick neck.  He is pleasantly and appropriately conversant. Neuro: He knows the month but not the exact date.  He follows simple instructions.  He moves all fours on  request.  Pupils are equal and his face is symmetric. Cardiovascular: S1 and S2 is regular at present without murmur rub or gallop. Lungs: Respirations are unlabored.  There is symmetric air movement, no wheezes.   Abdomen: Abdomen is obese and soft without any organomegaly masses tenderness guarding or rebound. LABS:  BMET Recent Labs  Lab 02/05/17 0139 02/06/17 0634 02/07/17 0249  NA 135 135 140  K 3.7 3.7 4.0  CL 98* 105 111  CO2 26 21* 22  BUN 12 11 14   CREATININE 0.85 0.73 0.75  GLUCOSE 102* 156* 132*    Electrolytes Recent Labs  Lab 02/05/17 0139 02/06/17 0634 02/07/17 0249  CALCIUM 9.3 8.5* 8.5*  MG  --  1.9  --   PHOS  --  3.4  --     CBC Recent Labs  Lab 02/05/17 0827 02/05/17 2315 02/06/17 0634  WBC 10.8* 11.6* 13.3*  HGB 14.6 13.7 13.4  HCT 43.3 39.9 39.5  PLT 276 263 265    Coag's Recent Labs  Lab 02/04/17 1453  INR 0.99    Sepsis Markers No results for input(s): LATICACIDVEN, PROCALCITON, O2SATVEN in the last 168 hours.  ABG Recent Labs  Lab 02/05/17 1621  PHART 7.373  PCO2ART 41.8  PO2ART 320.0*    Liver Enzymes Recent Labs  Lab 02/04/17 1453 02/06/17 0634  AST 19 15  ALT 26 19  ALKPHOS 58 51  BILITOT 0.5 0.5  ALBUMIN 3.7 2.7*    Cardiac Enzymes Recent Labs  Lab 02/04/17 1453  TROPONINI <0.03    Glucose Recent Labs  Lab 02/06/17 0804 02/06/17 1135 02/06/17 1534 02/06/17 2009 02/06/17 2339 02/07/17 0331  GLUCAP 128* 127* 121* 127* 141* 138*    Imaging No results found.    DISCUSSION: This is a 76 year old with a history of glucose intolerance, hypothyroidism, and paroxysmal atrial fibrillation who is status post drainage of bilateral subdural hematomas on 12/11.  He is alert postoperatively  ASSESSMENT / PLAN:  PULMONARY A: He was intubated solely for airway protection following sedation.  Extubation has been well-tolerated.   CARDIOVASCULAR A: He is maintaining sinus rhythm on flecainide.   Cardizem is not being used for an AV blocking agent but is an antihypertensive which he normally takes at home.  ENDOCRINE A: Sliding scale insulin is in place for coverage of glucose. TSH is normal  NEUROLOGIC A: Does not have gross neurological deficits following bilateral subdural drainage.   Lars Masson, MD Pulmonary and [place] Pager: [phone]  02/07/2017, 8:30 AM

## 2017-02-07 NOTE — Evaluation (Signed)
Physical Therapy Evaluation Patient Details Name: [name] [name] MRN: [id number] DOB: 09-08-1940 Today's Date: 02/07/2017   History of Present Illness  76 yo male with PMH significant for chronic a-fib previously on coumadin who presented on 02/04/17 with complaints of increasing fatigue pt was found to have a left subacute/chronic subdural hematoma. Underwent left frontoparietal craniotomy for subdural hematoma and right burr holes for evacuation of subdural hematoma 02/05/17.   Clinical Impression  Pt presented supine in bed with HOB elevated, awake and willing to participate in therapy session. Prior to admission, pt reported that he was independent with all functional mobility and ADLs. Pt ambulated in hallway with use of RW and close min guard with chair follow for safety. Pt had grandson and son present throughout session. They reported that he would have 24/7 supervision/assistance as needed upon d/c. Pt would continue to benefit from skilled physical therapy services at this time while admitted and after d/c to address the below listed limitations in order to improve overall safety and independence with functional mobility.     Follow Up Recommendations Home health PT;Supervision/Assistance - 24 hour    Equipment Recommendations  None recommended by PT    Recommendations for Other Services       Precautions / Restrictions Precautions Precautions: Fall Restrictions Weight Bearing Restrictions: No      Mobility  Bed Mobility Overal bed mobility: Needs Assistance Bed Mobility: Supine to Sit     Supine to sit: Supervision     General bed mobility comments: supervision for safety  Transfers Overall transfer level: Needs assistance Equipment used: Rolling walker (2 wheeled) Transfers: Sit to/from [employer] Transfers Sit to Stand: Min assist;+2 safety/equipment Stand pivot transfers: Min assist;+2 safety/equipment          Ambulation/Gait Ambulation/Gait  assistance: Min guard;+2 safety/equipment Ambulation Distance (Feet): 100 Feet Assistive device: Rolling walker (2 wheeled) Gait Pattern/deviations: Step-through pattern;Decreased step length - right;Decreased step length - left;Decreased stride length Gait velocity: decreased Gait velocity interpretation: Below normal speed for age/gender General Gait Details: no instability or LOB, close min guard for safety with use of RW  Stairs            Wheelchair Mobility    Modified Rankin (Stroke Patients Only)       Balance Overall balance assessment: Needs assistance Sitting-balance support: Feet supported Sitting balance-Leahy Scale: Good     Standing balance support: During functional activity;Bilateral upper extremity supported Standing balance-Leahy Scale: Poor Standing balance comment: reliant on RW                             Pertinent Vitals/Pain Pain Assessment: No/denies pain    Home Living Family/patient expects to be discharged to:: Private residence Living Arrangements: Spouse/significant other;Other relatives Available Help at Discharge: Family;Available 24 hours/day Type of Home: House Home Access: Ramped entrance     Home Layout: One level Home Equipment: Walker - 2 wheels;Cane - single point;Bedside commode;Shower seat(MAY HAVE OTHER dme)      Prior Function Level of Independence: Independent         Comments: DROVE; LIKES WATCHING SPORTS; ENJOYS HIS GRANDCHILDREN     Hand Dominance   Dominant Hand: Right    Extremity/Trunk Assessment   Upper Extremity Assessment Upper Extremity Assessment: Defer to OT evaluation    Lower Extremity Assessment Lower Extremity Assessment: Generalized weakness    Cervical / Trunk Assessment Cervical / Trunk Assessment: Other exceptions(forward head)  Communication  Communication: No difficulties  Cognition Arousal/Alertness: Awake/alert Behavior During Therapy: WFL for tasks  assessed/performed Overall Cognitive Status: Within Functional Limits for tasks assessed                                 General Comments: WILL FURTHER ASSESS COGNITION BUT APPAERAS INTACT TFOR TASKS ASSESSED      General Comments      Exercises     Assessment/Plan    PT Assessment Patient needs continued PT services  PT Problem List Decreased strength;Decreased balance;Decreased mobility;Decreased coordination;Decreased knowledge of use of DME;Decreased safety awareness       PT Treatment Interventions DME instruction;Gait training;Stair training;Functional mobility training;Therapeutic exercise;Therapeutic activities;Balance training;Neuromuscular re-education;Patient/family education    PT Goals (Current goals can be found in the Care Plan section)  Acute Rehab PT Goals Patient Stated Goal: to go home PT Goal Formulation: With patient Time For Goal Achievement: 02/21/17 Potential to Achieve Goals: Good    Frequency Min 3X/week   Barriers to discharge        Co-evaluation PT/OT/SLP Co-Evaluation/Treatment: Yes Reason for Co-Treatment: For patient/therapist safety;To address functional/ADL transfers PT goals addressed during session: Mobility/safety with mobility;Balance;Proper use of DME;Strengthening/ROM OT goals addressed during session: ADL's and self-care       AM-PAC PT "6 Clicks" Daily Activity  Outcome Measure Difficulty turning over in bed (including adjusting bedclothes, sheets and blankets)?: A Little Difficulty moving from lying on back to sitting on the side of the bed? : A Little Difficulty sitting down on and standing up from a chair with arms (e.g., wheelchair, bedside commode, etc,.)?: Unable Help needed moving to and from a bed to chair (including a wheelchair)?: A Little Help needed walking in hospital room?: A Little Help needed climbing 3-5 steps with a railing? : A Little 6 Click Score: 16    End of Session Equipment Utilized  During Treatment: Gait belt Activity Tolerance: Patient tolerated treatment well Patient left: in chair;with call bell/phone within reach;with nursing/sitter in room;with family/visitor present Nurse Communication: Mobility status PT Visit Diagnosis: Other abnormalities of gait and mobility (R26.89)    Time: 2706-2376 PT Time Calculation (min) (ACUTE ONLY): 31 min   Charges:   PT Evaluation $PT Eval Moderate Complexity: 1 Mod     PT G Codes:        [place], PT, DPT [place] 02/07/2017, 4:51 PM

## 2017-02-08 DIAGNOSIS — S065X9A Traumatic subdural hemorrhage with loss of consciousness of unspecified duration, initial encounter: Secondary | ICD-10-CM

## 2017-02-08 LAB — GLUCOSE, CAPILLARY
GLUCOSE-CAPILLARY: 97 mg/dL (ref 65–99)
Glucose-Capillary: 102 mg/dL — ABNORMAL HIGH (ref 65–99)
Glucose-Capillary: 109 mg/dL — ABNORMAL HIGH (ref 65–99)

## 2017-02-08 MED ORDER — LEVETIRACETAM 500 MG PO TABS: 500.0000 mg | ORAL_TABLET | Freq: Two times a day (BID) | ORAL | 2 refills | Status: DC

## 2017-02-08 MED ORDER — METHYLPREDNISOLONE 4 MG PO TABS: 4.0000 mg | ORAL_TABLET | Freq: Every day | ORAL | 0 refills | Status: DC

## 2017-02-08 MED ORDER — DEXAMETHASONE 4 MG PO TABS
4.0000 mg | ORAL_TABLET | Freq: Three times a day (TID) | ORAL | Status: DC
  Administered 2017-02-08: 4 mg via ORAL
  Filled 2017-02-08: qty 1

## 2017-02-08 MED ORDER — PANTOPRAZOLE SODIUM 40 MG PO TBEC: 40.0000 mg | DELAYED_RELEASE_TABLET | Freq: Every day | ORAL | Status: DC

## 2017-02-08 MED ORDER — LEVETIRACETAM 500 MG PO TABS
500.0000 mg | ORAL_TABLET | Freq: Two times a day (BID) | ORAL | Status: AC
  Administered 2017-02-08: 500 mg via ORAL
  Filled 2017-02-08: qty 1

## 2017-02-08 MED ORDER — HYDROCODONE-ACETAMINOPHEN 5-325 MG PO TABS: 1.0000 | ORAL_TABLET | ORAL | 0 refills | Status: DC | PRN

## 2017-02-08 NOTE — Progress Notes (Signed)
PULMONARY / CRITICAL CARE MEDICINE   Name: [name] BELLAND MRN: [id number] DOB: March 01, 1940    ADMISSION DATE:  02/04/2017  CHIEF COMPLAINT: Fatigue and lethargy, with finding of enlarging bilateral subdural hematomas   BRIEF SUMMARY:  76 year old with a history of intermittent atrial fibrillation for which he was formerly taking Coumadin as well as glucose intolerance, hypertension, and hypothyroidism who had a minor trauma prior to 11/28.  Admitted on 11/28 observed after the finding of bilateral subdural hematomas at which time his Coumadin was discontinued.  He returned for headaches on 12 /3 and was observed until 12/5 and again discharged home. He was readmitted 12/10 complaining of fatigue.  CT showed increasing subdural hematomas and he was taken electively to the operating room 12/11.  He was extubated 12/12.     SUBJECTIVE:  Pt reports feeling well.  Denies chest pain, palpitation, SOB.  States occasional dry cough / no sputum production.  No fevers.  VITAL SIGNS: BP (!) 163/85 (BP Location: Right Arm)   Pulse 65   Temp 97.9 F (36.6 C) (Oral)   Resp 12   Ht 5\' 9"  (1.753 m)   Wt 272 lb 11.3 oz (123.7 kg)   SpO2 98%   BMI 40.27 kg/m   HEMODYNAMICS:    VENTILATOR SETTINGS:    INTAKE / OUTPUT: I/O last 3 completed shifts: In: 2535 [P.O.:300; I.V.:2025; IV Piggyback:210] Out: 2325 [Urine:2325]  PHYSICAL EXAMINATION: General:  Obese elderly male in NAD, lying in bed HEENT: MM pink/moist, bilateral dressings to top of head, speech clear Neuro: AAOx4, speech clear, MAe  CV: s1s2 regular, no m/r/g PULM: even/non-labored, lungs bilaterally clear  PY:KDXI, non-tender, bsx4 active  Extremities: warm/dry, minimal trace edema  Skin: no rashes or lesions  LABS:  BMET Recent Labs  Lab 02/05/17 0139 02/06/17 0634 02/07/17 0249  NA 135 135 140  K 3.7 3.7 4.0  CL 98* 105 111  CO2 26 21* 22  BUN 12 11 14   CREATININE 0.85 0.73 0.75  GLUCOSE 102* 156* 132*     Electrolytes Recent Labs  Lab 02/05/17 0139 02/06/17 0634 02/07/17 0249  CALCIUM 9.3 8.5* 8.5*  MG  --  1.9  --   PHOS  --  3.4  --     CBC Recent Labs  Lab 02/05/17 0827 02/05/17 2315 02/06/17 0634  WBC 10.8* 11.6* 13.3*  HGB 14.6 13.7 13.4  HCT 43.3 39.9 39.5  PLT 276 263 265    Coag's Recent Labs  Lab 02/04/17 1453  INR 0.99    Sepsis Markers No results for input(s): LATICACIDVEN, PROCALCITON, O2SATVEN in the last 168 hours.  ABG Recent Labs  Lab 02/05/17 1621  PHART 7.373  PCO2ART 41.8  PO2ART 320.0*    Liver Enzymes Recent Labs  Lab 02/04/17 1453 02/06/17 0634  AST 19 15  ALT 26 19  ALKPHOS 58 51  BILITOT 0.5 0.5  ALBUMIN 3.7 2.7*    Cardiac Enzymes Recent Labs  Lab 02/04/17 1453  TROPONINI <0.03    Glucose Recent Labs  Lab 02/07/17 0829 02/07/17 1158 02/07/17 1549 02/07/17 2353 02/08/17 0335 02/08/17 0746  GLUCAP 133* 112* 140* 102* 97 109*    Imaging No results found.   DISCUSSION: 76 year old with a history of glucose intolerance, hypothyroidism, paroxysmal atrial fibrillation (previously on coumadin) with recent trauma who is status post drainage of bilateral subdural hematomas on 12/11.  Extubated 12/12.  To SDU 12/13.    ASSESSMENT / PLAN:  NEUROLOGIC A: Bilateral Subdural Hematoma s/p  Evacuation ([place]) P:  Per Neurosurgery  Decadron per NSGY  Continue keppra Dilaudid PRN for pain    PULMONARY A:  S/p Extubation - intubated for surgery only  P: Pulmonary hygiene - IS, mobilize   CARDIOVASCULAR A:  Atrial Fibrillation - hx of PAF, previously on coumadin  HTN HLD P: Continue flecainide  Lipitor PRN labetalol for SBP > 170   ENDOCRINE A:  Glucose Intolerance  Hypothyroidism  P: SSI  Continue synthroid   GI  A: Constipation  P:  Continue senokot-S PRN fleet   Global:  SDU status.  To TRH as of 12/15 am    Noe Gens, NP-C [facility] Pgr: [phone] or if  no answer [phone] 02/08/2017, 10:06 AM

## 2017-02-08 NOTE — Care Management Note (Signed)
Case Management Note  Patient Details  Name: [name] [name] MRN: [id number] Date of Birth: 1940-06-08  Subjective/Objective:    Pt admitted on 02/04/17 with bilateral SDH with increasing size of midline shift.   Pt s/p Left frontoparietal craniotomy for subdural hematoma, right burr holes for evacuation of subdural hematoma on 02/05/17.  PTA, pt resided at home with spouse.                Action/Plan: Will follow for discharge planning as pt progresses.  PT/OT consults when able to tolerate therapies.   Expected Discharge Date:  02/08/17               Expected Discharge Plan:  [name]  In-House Referral:     Discharge planning Services  CM Consult  Post Acute Care Choice:  Home Health Choice offered to:  Patient  DME Arranged:    DME Agency:     HH Arranged:  PT, OT HH Agency:  [facility]  Status of Service:  Completed, signed off  If discussed at [place] of Stay Meetings, dates discussed:    Additional Comments:   02/08/17 J. Franki Stemen, RN, BSN PT/OT recommending [name] follow up and pt agreeable.  Pt prefers [place] services with [facility] agency, as he has used this agency in the past.  Referral to [facility] with [facility] for [facility] needs.  Pt denies any DME needs for home, as he has RW and BSC.   Wife able to provide care at home.    Reinaldo Raddle, RN, BSN  Trauma/Neuro ICU Case Manager [phone]

## 2017-02-08 NOTE — Discharge Summary (Signed)
Date of Admission: 02/04/2017  Date of Discharge: 02/08/17  Admission Diagnosis: Bilateral chronic subdural hematomas, morbid obesity  Discharge Diagnosis: Same  Procedure Performed: Left craniotomy, right burr holes for subdural hematoma  Attending: Aldean Ast, MD  Hospital Course:  The patient was admitted with enlarging subdural hematomas.  I recommended the above listed operation which the patient tolerated well and after which he had an uncomplicated post-operative course.  He is discharged in stable condition.  Follow up: 3 weeks  Allergies as of 02/08/2017   No Known Allergies     Medication List    STOP taking these medications   acetaminophen-codeine 300-30 MG tablet Commonly known as:  TYLENOL #3     TAKE these medications   acetaminophen 500 MG tablet Commonly known as:  TYLENOL Take 1,000 mg by mouth every 6 (six) hours as needed for moderate pain.   albuterol 108 (90 Base) MCG/ACT inhaler Commonly known as:  PROVENTIL HFA;VENTOLIN HFA Inhale 2 puffs into the lungs every 6 (six) hours as needed for wheezing or shortness of breath.   atorvastatin 10 MG tablet Commonly known as:  LIPITOR TAKE 1 TABLET (10 MG TOTAL) BY MOUTH DAILY.   diltiazem 360 MG 24 hr capsule Commonly known as:  CARDIZEM CD TAKE 1 CAPSULE (360 MG TOTAL) BY MOUTH DAILY.   flecainide 100 MG tablet Commonly known as:  TAMBOCOR TAKE 1 TABLET (100 MG TOTAL) BY MOUTH 2 (TWO) TIMES DAILY.   HYDROcodone-acetaminophen 5-325 MG tablet Commonly known as:  NORCO/VICODIN Take 1 tablet by mouth every 4 (four) hours as needed for moderate pain.   levETIRAcetam 500 MG tablet Commonly known as:  KEPPRA Take 1 tablet (500 mg total) by mouth 2 (two) times daily.   levothyroxine 100 MCG tablet Commonly known as:  SYNTHROID, LEVOTHROID TAKE 1 TABLET (100 MCG TOTAL) BY MOUTH DAILY.   lisinopril 20 MG tablet Commonly known as:  PRINIVIL,ZESTRIL TAKE 1 TABLET (20 MG TOTAL) BY MOUTH DAILY.   metFORMIN 500 MG 24 hr tablet Commonly known as:  GLUCOPHAGE-XR TAKE 1 TABLET (500 MG TOTAL) BY MOUTH DAILY WITH BREAKFAST.   methylPREDNISolone 4 MG tablet Commonly known as:  MEDROL Take 1 tablet (4 mg total) by mouth daily. Dispense 1 medrol dose pack, take as directed

## 2017-02-08 NOTE — Progress Notes (Signed)
Physical Therapy Treatment Patient Details Name: [name] [name] MRN: [id number] DOB: March 15, 1940 Today's Date: 02/08/2017    History of Present Illness 76 yo male with PMH significant for chronic a-fib previously on coumadin who presented on 02/04/17 with complaints of increasing fatigue pt was found to have a left subacute/chronic subdural hematoma. Underwent left frontoparietal craniotomy for subdural hematoma and right burr holes for evacuation of subdural hematoma 02/05/17.     PT Comments    Pt progressed towards PT goals today, ambulating further distances with a slow and steady gait (min guard + RW) and performing sit-to-stand transfers with min assist and RW. Pt had bowel movement in toilet after ambulation. Pt reports overall he is feeling well and is looking forward to going back home for the holidays. PT will follow acutely while in hospital to promote safe mobility and increase level of independence.    Follow Up Recommendations  Home health PT;Supervision/Assistance - 24 hour     Equipment Recommendations  None recommended by PT    Recommendations for Other Services       Precautions / Restrictions Precautions Precautions: Fall Restrictions Weight Bearing Restrictions: No    Mobility  Bed Mobility               General bed mobility comments: at EOB upon PT arrival  Transfers Overall transfer level: Needs assistance Equipment used: Rolling walker (2 wheeled) Transfers: Sit to/from Stand Sit to Stand: Min assist         General transfer comment: x1 from EOB, x1 from toilet  Ambulation/Gait Ambulation/Gait assistance: Min guard Ambulation Distance (Feet): 150 Feet Assistive device: Rolling walker (2 wheeled) Gait Pattern/deviations: Step-through pattern;Decreased stride length;Wide base of support Gait velocity: decreased Gait velocity interpretation: Below normal speed for age/gender General Gait Details: slow and steady gait with feet slightly  turned out. Min guard for safety.    Stairs            Wheelchair Mobility    Modified Rankin (Stroke Patients Only)       Balance Overall balance assessment: Needs assistance Sitting-balance support: Feet supported Sitting balance-Leahy Scale: Good Sitting balance - Comments: Pt able to sit EOB and use UEs freely. Pt able to shift weight forward and backward without LOB.      Standing balance-Leahy Scale: Poor Standing balance comment: pt uses RW for standing balance                            Cognition Arousal/Alertness: Awake/alert Behavior During Therapy: WFL for tasks assessed/performed Overall Cognitive Status: Within Functional Limits for tasks assessed                                        Exercises      General Comments General comments (skin integrity, edema, etc.): Pt reports no dizzines, SOB, or pain. Pt able to use toilet in bathroom for bowel movement, but requires assist for getting on/off toilet and wiping. Pt c/o left eye burning during session.       Pertinent Vitals/Pain Pain Assessment: No/denies pain    Home Living                      Prior Function            PT Goals (current goals can now be found in the  care plan section) Progress towards PT goals: Progressing toward goals    Frequency    Min 3X/week      PT Plan Current plan remains appropriate    Co-evaluation              AM-PAC PT "6 Clicks" Daily Activity  Outcome Measure  Difficulty turning over in bed (including adjusting bedclothes, sheets and blankets)?: A Little Difficulty moving from lying on back to sitting on the side of the bed? : A Little Difficulty sitting down on and standing up from a chair with arms (e.g., wheelchair, bedside commode, etc,.)?: Unable Help needed moving to and from a bed to chair (including a wheelchair)?: A Little Help needed walking in hospital room?: A Little Help needed climbing 3-5  steps with a railing? : A Little 6 Click Score: 16    End of Session Equipment Utilized During Treatment: Gait belt Activity Tolerance: Patient tolerated treatment well Patient left: in chair;with chair alarm set;with call bell/phone within reach Nurse Communication: Mobility status PT Visit Diagnosis: Other abnormalities of gait and mobility (R26.89)     Time: 6837-2902 PT Time Calculation (min) (ACUTE ONLY): 23 min  Charges:  $Gait Training: 8-22 mins $Therapeutic Activity: 8-22 mins                    G Codes:       Judee Clara, SPT   Judee Clara 02/08/2017, 9:34 AM

## 2017-02-11 ENCOUNTER — Ambulatory Visit: Payer: PPO | Admitting: Family Medicine

## 2017-02-12 DIAGNOSIS — R7303 Prediabetes: Secondary | ICD-10-CM | POA: Diagnosis not present

## 2017-02-12 DIAGNOSIS — I10 Essential (primary) hypertension: Secondary | ICD-10-CM | POA: Diagnosis not present

## 2017-02-12 DIAGNOSIS — E039 Hypothyroidism, unspecified: Secondary | ICD-10-CM | POA: Diagnosis not present

## 2017-02-12 DIAGNOSIS — S065X0D Traumatic subdural hemorrhage without loss of consciousness, subsequent encounter: Secondary | ICD-10-CM | POA: Diagnosis not present

## 2017-02-12 DIAGNOSIS — I480 Paroxysmal atrial fibrillation: Secondary | ICD-10-CM | POA: Diagnosis not present

## 2017-02-12 DIAGNOSIS — Z7984 Long term (current) use of oral hypoglycemic drugs: Secondary | ICD-10-CM | POA: Diagnosis not present

## 2017-02-12 DIAGNOSIS — E785 Hyperlipidemia, unspecified: Secondary | ICD-10-CM | POA: Diagnosis not present

## 2017-02-13 ENCOUNTER — Ambulatory Visit
Admission: RE | Admit: 2017-02-13 | Discharge: 2017-02-13 | Disposition: A | Discharge status: Home / Self Care | Payer: PPO | Source: Ambulatory Visit | Attending: Neurological Surgery | Admitting: Neurological Surgery

## 2017-02-13 DIAGNOSIS — S065X0A Traumatic subdural hemorrhage without loss of consciousness, initial encounter: Secondary | ICD-10-CM | POA: Diagnosis not present

## 2017-02-13 DIAGNOSIS — S065XAA Traumatic subdural hemorrhage with loss of consciousness status unknown, initial encounter: Secondary | ICD-10-CM

## 2017-02-13 DIAGNOSIS — S065X9A Traumatic subdural hemorrhage with loss of consciousness of unspecified duration, initial encounter: Secondary | ICD-10-CM

## 2017-02-16 ENCOUNTER — Other Ambulatory Visit: Payer: Self-pay | Admitting: Family Medicine

## 2017-02-21 ENCOUNTER — Other Ambulatory Visit: Payer: Self-pay | Admitting: Neurological Surgery

## 2017-02-21 DIAGNOSIS — S065XAA Traumatic subdural hemorrhage with loss of consciousness status unknown, initial encounter: Secondary | ICD-10-CM

## 2017-02-21 DIAGNOSIS — S065X9A Traumatic subdural hemorrhage with loss of consciousness of unspecified duration, initial encounter: Secondary | ICD-10-CM

## 2017-02-22 ENCOUNTER — Ambulatory Visit: Payer: PPO | Admitting: Nurse Practitioner

## 2017-02-22 ENCOUNTER — Encounter: Payer: Self-pay | Admitting: Nurse Practitioner

## 2017-02-22 VITALS — BP 116/67 | HR 64 | Temp 97.4°F | Ht 69.0 in | Wt 267.0 lb

## 2017-02-22 DIAGNOSIS — R05 Cough: Secondary | ICD-10-CM | POA: Diagnosis not present

## 2017-02-22 DIAGNOSIS — J0100 Acute maxillary sinusitis, unspecified: Secondary | ICD-10-CM

## 2017-02-22 DIAGNOSIS — R059 Cough, unspecified: Secondary | ICD-10-CM

## 2017-02-22 MED ORDER — HYDROCODONE-HOMATROPINE 5-1.5 MG/5ML PO SYRP
5.0000 mL | ORAL_SOLUTION | Freq: Four times a day (QID) | ORAL | 0 refills | Status: DC | PRN
Start: 2017-02-22 — End: 2017-03-04

## 2017-02-22 MED ORDER — DOXYCYCLINE HYCLATE 100 MG PO TABS: 100.0000 mg | ORAL_TABLET | Freq: Two times a day (BID) | ORAL | 0 refills | Status: DC

## 2017-02-22 MED ORDER — AZITHROMYCIN 250 MG PO TABS: ORAL_TABLET | ORAL | 0 refills | Status: DC

## 2017-02-22 NOTE — Progress Notes (Addendum)
Subjective:     [name] [name] is a 76 y.o. male who presents for evaluation of sinus pain. Symptoms include: congestion, cough, facial pain, headaches, nasal congestion and sinus pressure. Onset of symptoms was a few weeks ago. Symptoms have been gradually worsening since that time. Past history is significant for occasional episodes of bronchitis. Patient is a non-smoker.  The following portions of the patient's history were reviewed and updated as appropriate: allergies, current medications, past family history, past medical history, past social history, past surgical history and problem list.  * him and his granddaughter bumped heads and he develop a hemorrhage and had to have surgery to relieve rain pressure.  Review of Systems Pertinent items noted in HPI and remainder of comprehensive ROS otherwise negative.   Objective:    BP 116/67   Pulse 64   Temp (!) 97.4 F (36.3 C) (Oral)   Ht 5\' 9"  (1.753 m)   Wt 267 lb (121.1 kg)   BMI 39.43 kg/m  General appearance: alert and cooperative Eyes: conjunctivae/corneas clear. PERRL, EOM's intact. Fundi benign. Ears: normal TM's and external ear canals both ears Nose: purulent discharge, moderate congestion, turbinates red, sinus tenderness bilateral Throat: lips, mucosa, and tongue normal; teeth and gums normal and post oral pharynx erythematous Neck: no adenopathy, no carotid bruit, no JVD, supple, symmetrical, trachea midline and thyroid not enlarged, symmetric, no tenderness/mass/nodules Lungs: clear to auscultation bilaterally and dry cough Heart: regular rate and rhythm, S1, S2 normal, no murmur, click, rub or gallop    Assessment:    Acute bacterial sinusitis with cough   Plan:  1. Acute maxillary sinusitis, recurrence not specified - azithromycin (ZITHROMAX) 250 MG tablet; Two tablets day one, then one tablet daily next 4 days.  Dispense: 6 tablet; Refill: 0  2. Cough - HYDROcodone-homatropine (HYCODAN) 5-1.5 MG/5ML syrup;  Take 5 mLs by mouth every 6 (six) hours as needed for cough.  Dispense: 120 mL; Refill: 0  1. Take meds as prescribed 2. Use a cool mist humidifier especially during the winter months and when heat has been humid. 3. Use saline nose sprays frequently 4. Saline irrigations of the nose can be very helpful if done frequently.  * 4X daily for 1 week*  * Use of a nettie pot can be helpful with this. Follow directions with this* 5. Drink plenty of fluids 6. Keep thermostat turn down low 7.For any cough or congestion  Use plain Mucinex- regular strength or max strength is fine   * Children- consult with Pharmacist for dosing 8. For fever or aces or pains- take tylenol or ibuprofen appropriate for age and weight.  * for fevers greater than 101 orally you may alternate ibuprofen and tylenol every  3 hours.   [place] called about zithromax and flecanide interaction so antibiotic was change dto doxycycline 100mg  1  Po bid #14.

## 2017-02-22 NOTE — Addendum Note (Signed)
Addended by: Chevis Pretty on: 02/22/2017 09:47 AM   Modules accepted: Orders

## 2017-02-22 NOTE — Patient Instructions (Signed)

## 2017-02-25 ENCOUNTER — Ambulatory Visit
Admission: RE | Admit: 2017-02-25 | Discharge: 2017-02-25 | Disposition: A | Discharge status: Home / Self Care | Payer: PPO | Source: Ambulatory Visit | Attending: Neurological Surgery | Admitting: Neurological Surgery

## 2017-02-25 DIAGNOSIS — I6200 Nontraumatic subdural hemorrhage, unspecified: Secondary | ICD-10-CM | POA: Diagnosis not present

## 2017-02-25 DIAGNOSIS — S065X9A Traumatic subdural hemorrhage with loss of consciousness of unspecified duration, initial encounter: Secondary | ICD-10-CM

## 2017-02-25 DIAGNOSIS — S065XAA Traumatic subdural hemorrhage with loss of consciousness status unknown, initial encounter: Secondary | ICD-10-CM

## 2017-03-04 ENCOUNTER — Encounter: Payer: Self-pay | Admitting: Family Medicine

## 2017-03-04 ENCOUNTER — Ambulatory Visit (INDEPENDENT_AMBULATORY_CARE_PROVIDER_SITE_OTHER): Payer: PPO | Admitting: Family Medicine

## 2017-03-04 VITALS — BP 113/63 | HR 58 | Temp 97.5°F | Ht 69.0 in | Wt 268.0 lb

## 2017-03-04 DIAGNOSIS — R05 Cough: Secondary | ICD-10-CM

## 2017-03-04 DIAGNOSIS — R059 Cough, unspecified: Secondary | ICD-10-CM

## 2017-03-04 MED ORDER — TRIAMCINOLONE ACETONIDE 40 MG/ML IJ SUSP
40.0000 mg | Freq: Once | INTRAMUSCULAR | Status: AC
  Administered 2017-03-04: 40 mg via INTRAMUSCULAR

## 2017-03-04 NOTE — Progress Notes (Signed)
   HPI  Patient presents today with cough.  Patient explains that he has been ill since his surgery in December.  He states that he was seen in December 28, about a week and a half ago and was treated with azithromycin and Hycodan cough syrup.  He does feel that he is getting better, however the cough is persistent and he has thick white sputum. He denies any shortness of breath, fever, chills, or difficulty tolerating foods or fluids.  He had craniotomy for subdural hematoma in December.  His headaches are resolved  PMH: Smoking status noted ROS: Per HPI  Objective: BP 113/63   Pulse (!) 58   Temp (!) 97.5 F (36.4 C) (Oral)   Ht 5\' 9"  (1.753 m)   Wt 268 lb (121.6 kg)   SpO2 97%   BMI 39.58 kg/m  Gen: NAD, alert, cooperative with exam HEENT: NCAT, moist oral mucosa CV: RRR, good S1/S2, no murmur Resp: CTABL, no wheezes, non-labored Ext: No edema, warm Neuro: Alert and oriented, No gross deficits  Assessment and plan:  #Cough Postinfectious cough most likely, patient seems to be improving. Given IM Kenalog for symptomatic relief. Discussed supportive care Return to clinic with any concerns  Meds ordered this encounter  Medications  . triamcinolone acetonide (KENALOG-40) injection 40 mg    Laroy Apple, MD [facility] 03/04/2017, 8:11 AM

## 2017-03-04 NOTE — Patient Instructions (Signed)
Great to see you!   Cough, Adult A cough helps to clear your throat and lungs. A cough may last only 2-3 weeks (acute), or it may last longer than 8 weeks (chronic). Many different things can cause a cough. A cough may be a sign of an illness or another medical condition. Follow these instructions at home:  Pay attention to any changes in your cough.  Take medicines only as told by your doctor. ? If you were prescribed an antibiotic medicine, take it as told by your doctor. Do not stop taking it even if you start to feel better. ? Talk with your doctor before you try using a cough medicine.  Drink enough fluid to keep your pee (urine) clear or pale yellow.  If the air is dry, use a cold steam vaporizer or humidifier in your home.  Stay away from things that make you cough at work or at home.  If your cough is worse at night, try using extra pillows to raise your head up higher while you sleep.  Do not smoke, and try not to be around smoke. If you need help quitting, ask your doctor.  Do not have caffeine.  Do not drink alcohol.  Rest as needed. Contact a doctor if:  You have new problems (symptoms).  You cough up yellow fluid (pus).  Your cough does not get better after 2-3 weeks, or your cough gets worse.  Medicine does not help your cough and you are not sleeping well.  You have pain that gets worse or pain that is not helped with medicine.  You have a fever.  You are losing weight and you do not know why.  You have night sweats. Get help right away if:  You cough up blood.  You have trouble breathing.  Your heartbeat is very fast. This information is not intended to replace advice given to you by your health care provider. Make sure you discuss any questions you have with your health care provider. Document Released: 10/26/2010 Document Revised: 07/21/2015 Document Reviewed: 04/21/2014 Elsevier Interactive Patient Education  [employer].

## 2017-03-08 ENCOUNTER — Other Ambulatory Visit: Payer: Self-pay | Admitting: Family Medicine

## 2017-05-05 ENCOUNTER — Other Ambulatory Visit: Payer: Self-pay | Admitting: Family Medicine

## 2017-05-13 ENCOUNTER — Encounter: Payer: Self-pay | Admitting: Family Medicine

## 2017-05-13 ENCOUNTER — Ambulatory Visit (INDEPENDENT_AMBULATORY_CARE_PROVIDER_SITE_OTHER): Payer: PPO | Admitting: Family Medicine

## 2017-05-13 VITALS — BP 112/57 | HR 59 | Temp 97.0°F | Ht 69.0 in | Wt 282.0 lb

## 2017-05-13 DIAGNOSIS — E785 Hyperlipidemia, unspecified: Secondary | ICD-10-CM

## 2017-05-13 DIAGNOSIS — I480 Paroxysmal atrial fibrillation: Secondary | ICD-10-CM

## 2017-05-13 DIAGNOSIS — R7303 Prediabetes: Secondary | ICD-10-CM | POA: Diagnosis not present

## 2017-05-13 LAB — COAGUCHEK XS/INR WAIVED
INR: 2.4 — ABNORMAL HIGH (ref 0.9–1.1)
Prothrombin Time: 28.5 s

## 2017-05-13 NOTE — Progress Notes (Signed)
   HPI  Patient presents today here to follow up for chronic medical problems  Pt restarted coumadin 6 weeks ago after a subdural hematoma about 3 months ago. Has intermittent lightheadedness after surgery.   Watching diet moderately, tolerating metformin well.  A1C Ip was 5.9  He denies any bleeding, taking old regimen of coumadin x 6 weeks  Takes metformin, not watching diet very closely.   PMH: Smoking status noted ROS: Per HPI  Objective: BP (!) 112/57   Pulse (!) 59   Temp (!) 97 F (36.1 C) (Oral)   Ht '5\' 9"'$  (1.753 m)   Wt 282 lb (127.9 kg)   BMI 41.64 kg/m  Gen: NAD, alert, cooperative with exam HEENT: NCAT CV: RRR, good S1/S2, no murmur Resp: CTABL, no wheezes, non-labored Ext: No edema, warm Neuro: Alert and oriented, No gross deficits  Assessment and plan:  # A fib Rate controlled INR is theraputic, recheck 6 weeks  # HLD Labs today, continue lipitor  # Pre-diabetes Admits some indiscretion A1C up to date    Orders Placed This Encounter  Procedures  . CBC with Differential/Platelet  . CMP14+EGFR  . Lipid panel  . CoaguChek XS/INR Waived  . TSH     Laroy Apple, MD [facility] 05/13/2017, 11:56 AM

## 2017-05-13 NOTE — Patient Instructions (Signed)
Great to see you!  Come back for an INR check in

## 2017-05-14 LAB — CMP14+EGFR
A/G RATIO: 1.6 (ref 1.2–2.2)
ALBUMIN: 4.1 g/dL (ref 3.5–4.8)
ALT: 20 IU/L (ref 0–44)
AST: 7 IU/L (ref 0–40)
Alkaline Phosphatase: 59 IU/L (ref 39–117)
BUN / CREAT RATIO: 12 (ref 10–24)
BUN: 11 mg/dL (ref 8–27)
Bilirubin Total: 0.3 mg/dL (ref 0.0–1.2)
CALCIUM: 9.1 mg/dL (ref 8.6–10.2)
CO2: 24 mmol/L (ref 20–29)
Chloride: 104 mmol/L (ref 96–106)
Creatinine, Ser: 0.95 mg/dL (ref 0.76–1.27)
GFR, EST AFRICAN AMERICAN: 91 mL/min/{1.73_m2} (ref >59)
GFR, EST NON AFRICAN AMERICAN: 79 mL/min/{1.73_m2} (ref >59)
GLUCOSE: 109 mg/dL — AB (ref 65–99)
Globulin, Total: 2.6 g/dL (ref 1.5–4.5)
Potassium: 4.8 mmol/L (ref 3.5–5.2)
Sodium: 144 mmol/L (ref 134–144)
TOTAL PROTEIN: 6.7 g/dL (ref 6.0–8.5)

## 2017-05-14 LAB — LIPID PANEL
CHOL/HDL RATIO: 2.6 ratio (ref 0.0–5.0)
Cholesterol, Total: 116 mg/dL (ref 100–199)
HDL: 45 mg/dL (ref >39)
LDL Calculated: 48 mg/dL (ref 0–99)
Triglycerides: 115 mg/dL (ref 0–149)
VLDL CHOLESTEROL CAL: 23 mg/dL (ref 5–40)

## 2017-05-14 LAB — CBC WITH DIFFERENTIAL/PLATELET
BASOS: 1 %
Basophils Absolute: 0.1 10*3/uL (ref 0.0–0.2)
EOS (ABSOLUTE): 0.1 10*3/uL (ref 0.0–0.4)
Eos: 1 %
HEMOGLOBIN: 13.5 g/dL (ref 13.0–17.7)
Hematocrit: 41.5 % (ref 37.5–51.0)
IMMATURE GRANULOCYTES: 0 %
Immature Grans (Abs): 0 10*3/uL (ref 0.0–0.1)
Lymphocytes Absolute: 2.5 10*3/uL (ref 0.7–3.1)
Lymphs: 29 %
MCH: 29.4 pg (ref 26.6–33.0)
MCHC: 32.5 g/dL (ref 31.5–35.7)
MCV: 90 fL (ref 79–97)
MONOCYTES: 13 %
Monocytes Absolute: 1.1 10*3/uL — ABNORMAL HIGH (ref 0.1–0.9)
NEUTROS ABS: 4.9 10*3/uL (ref 1.4–7.0)
NEUTROS PCT: 56 %
PLATELETS: 262 10*3/uL (ref 150–379)
RBC: 4.59 x10E6/uL (ref 4.14–5.80)
RDW: 14.7 % (ref 12.3–15.4)
WBC: 8.6 10*3/uL (ref 3.4–10.8)

## 2017-05-15 LAB — TSH: TSH: 1.59 u[IU]/mL (ref 0.450–4.500)

## 2017-05-15 LAB — SPECIMEN STATUS REPORT

## 2017-05-19 ENCOUNTER — Other Ambulatory Visit: Payer: Self-pay | Admitting: Pediatrics

## 2017-05-28 ENCOUNTER — Other Ambulatory Visit: Payer: Self-pay | Admitting: *Deleted

## 2017-05-28 MED ORDER — WARFARIN SODIUM 5 MG PO TABS: 5.0000 mg | ORAL_TABLET | Freq: Every day | ORAL | 0 refills | Status: DC

## 2017-05-28 MED ORDER — WARFARIN SODIUM 5 MG PO TABS: ORAL_TABLET | ORAL | 0 refills | Status: DC

## 2017-06-24 ENCOUNTER — Ambulatory Visit: Payer: Self-pay | Admitting: Family Medicine

## 2017-06-25 ENCOUNTER — Ambulatory Visit (INDEPENDENT_AMBULATORY_CARE_PROVIDER_SITE_OTHER): Payer: PPO | Admitting: Family Medicine

## 2017-06-25 ENCOUNTER — Encounter: Payer: Self-pay | Admitting: Family Medicine

## 2017-06-25 VITALS — BP 134/66 | HR 65 | Temp 98.2°F | Ht 69.0 in | Wt 287.4 lb

## 2017-06-25 DIAGNOSIS — I480 Paroxysmal atrial fibrillation: Secondary | ICD-10-CM | POA: Diagnosis not present

## 2017-06-25 LAB — COAGUCHEK XS/INR WAIVED
INR: 1.9 — AB (ref 0.9–1.1)
Prothrombin Time: 23.1 s

## 2017-06-25 NOTE — Progress Notes (Signed)
   HPI  Patient presents today here for INR  Pt can clearly describe his regimen. Denies any missed doses bleeding, or diet changes.   PMH: Smoking status noted ROS: Per HPI  Objective: BP 134/66   Pulse 65   Temp 98.2 F (36.8 C) (Oral)   Ht 5\' 9"  (1.753 m)   Wt 287 lb 6.4 oz (130.4 kg)   BMI 42.44 kg/m  Gen: NAD, alert, cooperative with exam HEENT: NCAT Ext: No edema, warm Neuro: Alert and oriented  Assessment and plan:  # A fib, chronic a fib INR slightly subtherapeutic today, no changes 6 week recheck.    Orders Placed This Encounter  Procedures  . CoaguChek XS/INR Carney Bern, MD [facility] 06/25/2017, 1:10 PM

## 2017-07-04 ENCOUNTER — Other Ambulatory Visit: Payer: Self-pay | Admitting: Family Medicine

## 2017-07-04 NOTE — Telephone Encounter (Signed)
Next Ov 08/06/17

## 2017-07-28 ENCOUNTER — Other Ambulatory Visit: Payer: Self-pay | Admitting: Family Medicine

## 2017-08-05 ENCOUNTER — Other Ambulatory Visit: Payer: Self-pay | Admitting: Pediatrics

## 2017-08-06 ENCOUNTER — Ambulatory Visit (INDEPENDENT_AMBULATORY_CARE_PROVIDER_SITE_OTHER): Payer: PPO | Admitting: Family Medicine

## 2017-08-06 ENCOUNTER — Encounter: Payer: Self-pay | Admitting: Family Medicine

## 2017-08-06 VITALS — BP 124/66 | HR 63 | Temp 97.5°F | Ht 69.0 in | Wt 289.4 lb

## 2017-08-06 DIAGNOSIS — I480 Paroxysmal atrial fibrillation: Secondary | ICD-10-CM | POA: Diagnosis not present

## 2017-08-06 DIAGNOSIS — Z7901 Long term (current) use of anticoagulants: Secondary | ICD-10-CM | POA: Diagnosis not present

## 2017-08-06 LAB — COAGUCHEK XS/INR WAIVED
INR: 3 — AB (ref 0.9–1.1)
Prothrombin Time: 35.6 s

## 2017-08-06 NOTE — Progress Notes (Signed)
   HPI  Patient presents today for INR.  Patient has history of atrial fibrillation on chronic anticoagulation for that. No bleeding No missed pills Easily recounts his Coumadin regimen.  Patient is having difficult time recently as his son died 3 weeks ago.   PMH: Smoking status noted ROS: Per HPI  Objective: BP 124/66   Pulse 63   Temp (!) 97.5 F (36.4 C) (Oral)   Ht 5\' 9"  (1.753 m)   Wt 289 lb 6.4 oz (131.3 kg)   BMI 42.74 kg/m  Gen: NAD, alert, cooperative with exam HEENT: NCAT CV: RRR, good S1/S2 Resp: CTABL, no wheezes, non-labored Ext: No edema, warm Neuro: Alert and oriented, No gross deficits  Assessment and plan:  #A. fib, long-term use of anticoagulants INR therapeutic, no changes Follow-up 4 to 6 weeks Labs next visit   Orders Placed This Encounter  Procedures  . CoaguChek XS/INR Carney Bern, MD [facility] 08/06/2017, 10:40 AM

## 2017-08-23 ENCOUNTER — Other Ambulatory Visit: Payer: Self-pay | Admitting: Family Medicine

## 2017-08-26 ENCOUNTER — Telehealth: Payer: Self-pay | Admitting: Family Medicine

## 2017-08-30 ENCOUNTER — Ambulatory Visit (INDEPENDENT_AMBULATORY_CARE_PROVIDER_SITE_OTHER): Payer: PPO | Admitting: Family Medicine

## 2017-08-30 VITALS — BP 132/65 | HR 56 | Temp 98.8°F | Ht 69.0 in | Wt 295.0 lb

## 2017-08-30 DIAGNOSIS — Z0000 Encounter for general adult medical examination without abnormal findings: Secondary | ICD-10-CM | POA: Diagnosis not present

## 2017-08-30 NOTE — Progress Notes (Signed)
Subjective:   TRISTAN PROTO is a 77 y.o. male who presents for Medicare Annual/Subsequent preventive examination.  He is a retired Engineer, manufacturing systems who lives at home with his wife, one grandson, and three great grandchildren ages 49, 82 and 5.  He has one living son.  He and his wife lost their other son in May of this year.  They have 5 grandchildren, and 6 great-grandchildren.  They have a pool at home, so he enjoys spending time in the pool with the three children who are in the home.  He enjoys cooking and a normal days diet is two meals and healthy snacks.    Review of Systems:   Cardiac Risk Factors include: advanced age (>52men, >34 women);diabetes mellitus;dyslipidemia;hypertension;male gender;sedentary lifestyle     Objective:    Vitals: BP 132/65   Pulse (!) 56   Temp 98.8 F (37.1 C) (Oral)   Ht 5\' 9"  (1.753 m)   Wt 295 lb (133.8 kg)   BMI 43.56 kg/m   Body mass index is 43.56 kg/m.  Advanced Directives 08/30/2017 02/05/2017 02/04/2017 01/28/2017 01/23/2017 01/23/2017 01/23/2017  Does Patient Have a Medical Advance Directive? No No No No No No No  Would patient like information on creating a medical advance directive? Yes (Inpatient - patient defers creating a medical advance directive at this time) No - Patient declined - No - Patient declined No - Patient declined No - Patient declined No - Patient declined    Tobacco Social History   Tobacco Use  Smoking Status Never Smoker  Smokeless Tobacco Never Used      Clinical Intake:  Past Medical History:  Diagnosis Date  . Arthritis   . Atrial fibrillation ([place])    previously on Coumadin, reversed with head bleed  . Cataract   . Hyperlipidemia   . HYPERTENSION   . HYPOTHYROIDISM   . 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 Pre-diabetes   . Precancerous skin lesion    on back  . ROTATOR CUFF TEAR   . 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  APPENDECTOMY    . CATARACT EXTRACTION W/PHACO Right 02/02/2014   Procedure: CATARACT EXTRACTION PHACO AND INTRAOCULAR LENS PLACEMENT (IOC);  Surgeon: Elta Guadeloupe T. Gershon Crane, MD;  Location: [facility];  Service: Ophthalmology;  Laterality: Right;  CDE 12.17  . CATARACT EXTRACTION W/PHACO Left 02/16/2014   Procedure: CATARACT EXTRACTION PHACO AND INTRAOCULAR LENS PLACEMENT ;  Surgeon: Elta Guadeloupe T. Gershon Crane, MD;  Location: [facility];  Service: Ophthalmology;  Laterality: Left;  CDE:13.42  . CRANIOTOMY N/A 02/05/2017   Procedure: Bilateral Burr Holes . Craniotomy for Subdural;  Surgeon: Ditty, Kevan Ny, MD;  Location: ;  Service: Neurosurgery;  Laterality: N/A;  . EYE SURGERY  2015   cataract surgery. bilateral  . JOINT REPLACEMENT  2009   bilateral knee replacement  . KNEE SURGERY    . ROTATOR CUFF REPAIR Left   . SHOULDER SURGERY    . TOTAL KNEE ARTHROPLASTY Bilateral    Family History  Problem Relation Age of Onset  . Heart attack Brother   . Early death Brother   . Asthma Father   . Vision loss Father   . Hypertension Mother   . Heart attack Brother 40  . Arthritis Brother    Social History   Socioeconomic History  . Marital status: Married    Spouse name: Not on file  .  Number of children: 2  . Years of education: Not on file  . Highest education level: Not on file  Occupational History  . Not on file  Social Needs  . Financial resource strain: Not hard at all  . Food insecurity:    Worry: Never true    Inability: Never true  . Transportation needs:    Medical: No    Non-medical: No  Tobacco Use  . Smoking status: Never Smoker  . Smokeless tobacco: Never Used  Substance and Sexual Activity  . 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 Drug use: No    Comment: remote h/o marijuana use  . Sexual activity: Never    Birth control/protection: None  Lifestyle  . Physical activity:    Days per week: 0 days    Minutes  per session: 0 min  . Stress: Not at all  Relationships  . Social connections:    Talks on phone: More than three times a week    Gets together: More than three times a week    Attends religious service: More than 4 times per year    Active member of club or organization: Yes    Attends meetings of clubs or organizations: More than 4 times per year    Relationship status: Married  Other Topics Concern  . Not on file  Social History Narrative        Outpatient Encounter Medications as of 08/30/2017  Medication Sig  . albuterol (PROVENTIL HFA;VENTOLIN HFA) 108 (90 Base) MCG/ACT inhaler Inhale 2 puffs into the lungs every 6 (six) hours as needed for wheezing or shortness of breath.  Marland Kitchen atorvastatin (LIPITOR) 10 MG tablet TAKE 1 TABLET (10 MG TOTAL) BY MOUTH DAILY.  Marland Kitchen diltiazem (CARDIZEM CD) 360 MG 24 hr capsule TAKE 1 CAPSULE (360 MG TOTAL) BY MOUTH DAILY.  . flecainide (TAMBOCOR) 100 MG tablet TAKE 1 TABLET (100 MG TOTAL) BY MOUTH 2 (TWO) TIMES DAILY.  Marland Kitchen levothyroxine (SYNTHROID, LEVOTHROID) 50 MCG tablet TAKE 2 TABLETS BY MOUTH DAILY  . lisinopril (PRINIVIL,ZESTRIL) 20 MG tablet TAKE 1 TABLET (20 MG TOTAL) BY MOUTH DAILY.  . metFORMIN (GLUCOPHAGE-XR) 500 MG 24 hr tablet TAKE 1 TABLET (500 MG TOTAL) BY MOUTH DAILY WITH BREAKFAST.  Marland Kitchen warfarin (COUMADIN) 5 MG tablet Take 1/2 tab Mon, Wed and Fri and 1 tab on all other days  . warfarin (COUMADIN) 5 MG tablet TAKE 1/2 TO 1 TABLET EVERYDAY PER ANTICOAGULATION CLINIC   No facility-administered encounter medications on file as of 08/30/2017.     Activities of Daily Living In your present state of health, do you have any difficulty performing the following activities: 08/30/2017 02/05/2017  Hearing? N -  Vision? Y -  Comment blind in left eye -  Difficulty concentrating or making decisions? N -  Comment - -  Walking or climbing stairs? N -  Dressing or bathing? N -  Doing errands, shopping? N N  Preparing Food and eating ? N -  Using the  Toilet? N -  In the past six months, have you accidently leaked urine? N -  Do you have problems with loss of bowel control? N -  Managing your Medications? N -  Managing your Finances? N -  Housekeeping or managing your Housekeeping? N -  Some recent data might be hidden   Patient reports he is blind in his left eye.  Patient Care Team: Timmothy Euler, MD as PCP - General (Family Medicine) Rutherford Guys, MD as Consulting Physician (Ophthalmology) Stanford Breed, Denice Bors, MD as Consulting Physician (Cardiology) Rogene Houston, MD as Consulting Physician (Gastroenterology) Gaynelle Arabian, MD as Consulting Physician (Orthopedic Surgery) Celene Squibb, MD as Consulting Physician (Dermatology)   Assessment:   This is a routine wellness examination for Jarious.  Exercise Activities and Dietary recommendations Current Exercise Habits: The patient does not participate in regular exercise at present  Goals    . DIET - REDUCE CALORIE INTAKE    . Exercise 3x per week (30 min per time)       Fall Risk Fall Risk  08/30/2017 08/06/2017 06/25/2017 05/13/2017 03/04/2017  Falls in the past year? No No No No No   Is the patient's home free of loose throw rugs in walkways, pet beds, electrical cords, etc?   Yes      Grab bars in the bathroom? Yes      Handrails on the stairs?   Yes      Adequate lighting?   Yes  Depression Screen PHQ 2/9 Scores 08/30/2017 08/06/2017 06/25/2017 05/13/2017  PHQ - 2 Score 0 0 0 0    Cognitive Function MMSE - Mini Mental State Exam 08/30/2017 07/12/2016 04/29/2015  Orientation to time 5 5 5   Orientation to Place 5 5 5   Registration 3 3 3   Attention/ Calculation 5 5 3   Recall 3 3 3   Language- name 2 objects 2 2 2   Language- repeat 1 1 1   Language- follow 3 step command 3 3 3   Language- read & follow direction 1 1 1   Write a sentence 1 1 1   Copy design 1 1 1   Total score 30 30 28    Patient performed well on the MMSE, scoring 30 out of 30 possible  points.  Immunization History  Administered Date(s) Administered  . Influenza, High Dose Seasonal PF 11/30/2014, 01/04/2017  . Influenza,inj,Quad PF,6+ Mos 12/06/2015  . Influenza-Unspecified 12/01/2013, 11/28/2014  . Pneumococcal Conjugate-13 07/08/2015  . Pneumococcal Polysaccharide-23 02/26/2010, 07/12/2016  . Td 02/26/2010    Screening Tests Health Maintenance  Topic Date Due  . INFLUENZA VACCINE  09/26/2017  . TETANUS/TDAP  02/27/2020  . COLONOSCOPY  07/18/2020  . PNA vac Low Risk Adult  Completed   Cancer Screenings: Lung: Low Dose CT Chest recommended if Age 23-80 years, 30 pack-year currently smoking OR have quit w/in 15years. Patient does not qualify. Colorectal:  Patient's is due for colonoscopy in May of 2022.    Plan:     Follow up with PCP, Dr. Wendi Snipes, on 09/17/2017.  I have personally reviewed and noted the following in the patient's chart:   . Medical and social history . Use of alcohol, tobacco or illicit drugs  . Current medications and supplements . Functional ability and status . Nutritional status . Physical activity . Advanced directives . List of other physicians . Hospitalizations, surgeries, and ER visits in previous 12 months . Vitals . Screenings to include cognitive, depression, and falls . Referrals and appointments  In addition, I have reviewed and discussed with patient certain preventive protocols, quality metrics, and best practice recommendations. A written personalized care plan for preventive services as well as general preventive health recommendations were provided to patient.     Burnadette Pop, LPN  04/04/2534

## 2017-08-30 NOTE — Patient Instructions (Signed)
  [name] [name] , Thank you for taking time to come for your Medicare Wellness Visit. I appreciate your ongoing commitment to your health goals. Please review the following plan we discussed and let me know if I can assist you in the future.   These are the goals we discussed: Goals    . DIET - REDUCE CALORIE INTAKE    . Exercise 3x per week (30 min per time)       This is a list of the screening recommended for you and due dates:  Health Maintenance  Topic Date Due  . Flu Shot  09/26/2017  . Tetanus Vaccine  02/27/2020  . Colon Cancer Screening  07/18/2020  . Pneumonia vaccines  Completed

## 2017-09-16 ENCOUNTER — Other Ambulatory Visit: Payer: Self-pay | Admitting: Family Medicine

## 2017-09-17 ENCOUNTER — Ambulatory Visit (INDEPENDENT_AMBULATORY_CARE_PROVIDER_SITE_OTHER): Payer: PPO | Admitting: Family Medicine

## 2017-09-17 ENCOUNTER — Encounter: Payer: Self-pay | Admitting: Family Medicine

## 2017-09-17 VITALS — BP 134/70 | HR 58 | Temp 97.0°F | Ht 69.0 in | Wt 294.0 lb

## 2017-09-17 DIAGNOSIS — I480 Paroxysmal atrial fibrillation: Secondary | ICD-10-CM | POA: Diagnosis not present

## 2017-09-17 DIAGNOSIS — Z7901 Long term (current) use of anticoagulants: Secondary | ICD-10-CM | POA: Diagnosis not present

## 2017-09-17 LAB — COAGUCHEK XS/INR WAIVED
INR: 1.9 — AB (ref 0.9–1.1)
Prothrombin Time: 23.4 s

## 2017-09-17 NOTE — Patient Instructions (Signed)
Great to see you!  Come back in 4-6 weeks for repeat INR check

## 2017-09-17 NOTE — Progress Notes (Signed)
   HPI  Patient presents today for INR check.  Patient is anticoagulated long-term for atrial fibrillation with Coumadin.  He denies any missed pills.  He denies any bleeding or diet changes He can easily recount his Coumadin regimen which is 5 mg daily except for 2.5 mg on Monday, Wednesday, Friday.  PMH: Smoking status noted ROS: Per HPI  Objective: BP 134/70   Pulse (!) 58   Temp (!) 97 F (36.1 C) (Oral)   Ht 5\' 9"  (1.753 m)   Wt 294 lb (133.4 kg)   BMI 43.42 kg/m  Gen: NAD, alert, cooperative with exam HEENT: NCAT Ext: No edema, warm Neuro: Alert and oriented, No gross deficits  Assessment and plan:  #A. fib, long-term use of anticoagulant INR is very close to therapeutic today, 1.9 Previous on same dose he was 3.0, expect he is fluctuating between these 2 numbers and will likely be between 2 and 3 most of the time. Recheck 4 to 6 weeks    Orders Placed This Encounter  Procedures  . CoaguChek XS/INR Carney Bern, MD [facility] 09/17/2017, 10:37 AM

## 2017-09-27 ENCOUNTER — Telehealth: Payer: Self-pay | Admitting: Family Medicine

## 2017-09-27 NOTE — Telephone Encounter (Signed)
Faxed release number [id number]

## 2017-10-09 DIAGNOSIS — N5201 Erectile dysfunction due to arterial insufficiency: Secondary | ICD-10-CM | POA: Diagnosis not present

## 2017-10-22 ENCOUNTER — Other Ambulatory Visit: Payer: Self-pay | Admitting: Family Medicine

## 2017-10-22 NOTE — Telephone Encounter (Signed)
Ov 10/30/17

## 2017-10-30 ENCOUNTER — Ambulatory Visit (INDEPENDENT_AMBULATORY_CARE_PROVIDER_SITE_OTHER): Payer: PPO | Admitting: Family Medicine

## 2017-10-30 ENCOUNTER — Encounter: Payer: Self-pay | Admitting: Family Medicine

## 2017-10-30 VITALS — BP 107/57 | HR 53 | Temp 96.9°F | Ht 69.0 in | Wt 298.0 lb

## 2017-10-30 DIAGNOSIS — I480 Paroxysmal atrial fibrillation: Secondary | ICD-10-CM

## 2017-10-30 LAB — COAGUCHEK XS/INR WAIVED
INR: 2.6 — ABNORMAL HIGH (ref 0.9–1.1)
Prothrombin Time: 30.8 s

## 2017-10-30 NOTE — Progress Notes (Signed)
   BP (!) 107/57   Pulse (!) 53   Temp (!) 96.9 F (36.1 C) (Oral)   Ht 5\' 9"  (1.753 m)   Wt 298 lb (135.2 kg)   BMI 44.01 kg/m    Subjective:    Patient ID: [name] [name], male    DOB: 10-25-1940, 77 y.o.   MRN: [id number]  HPI: [name] [name] is a 77 y.o. male presenting on 10/30/2017 for Coagulation Disorder   HPI Patient is coming in for Coumadin/INR recheck today. Coming in for INR recheck today.  He denies any bruising or bleeding abnormalities.  He has been doing well on his Coumadin dose and is on the same doses last time.  His previous INR was 1.9 which is right on the border so the dose did not change.  He typically fluctuates between 1.9 and 3.0.  He denies any chest pain or palpitations and denies being out of rhythm in quite a long time.  Relevant past medical, surgical, family and social history reviewed and updated as indicated. Interim medical history since our last visit reviewed. Allergies and medications reviewed and updated.  Review of Systems  Constitutional: Negative for chills and fever.  Respiratory: Negative for shortness of breath and wheezing.   Cardiovascular: Negative for chest pain and leg swelling.  Gastrointestinal: Negative for anal bleeding and blood in stool.  Genitourinary: Negative for hematuria.  Musculoskeletal: Negative for back pain and gait problem.  Skin: Negative for rash.  All other systems reviewed and are negative.   Per HPI unless specifically indicated above        Objective:    BP (!) 107/57   Pulse (!) 53   Temp (!) 96.9 F (36.1 C) (Oral)   Ht 5\' 9"  (1.753 m)   Wt 298 lb (135.2 kg)   BMI 44.01 kg/m   Wt Readings from Last 3 Encounters:  10/30/17 298 lb (135.2 kg)  09/17/17 294 lb (133.4 kg)  08/30/17 295 lb (133.8 kg)    Physical Exam  Constitutional: He is oriented to person, place, and time. He appears well-developed and well-nourished. No distress.  Eyes: Conjunctivae are normal. No scleral icterus.    Cardiovascular: Normal rate, regular rhythm, normal heart sounds and intact distal pulses.  No murmur heard. Pulmonary/Chest: Effort normal and breath sounds normal. No respiratory distress. He has no wheezes.  Neurological: He is alert and oriented to person, place, and time. Coordination normal.  Skin: Skin is warm and dry. No rash noted. He is not diaphoretic.  Psychiatric: He has a normal mood and affect. His behavior is normal.  Nursing note and vitals reviewed.   Description   Continue warfarin 5mg  - take 1/2 tablet mondays, wednesdays and fridays and 1 tablet all other days.  INR was 2.6 today        Assessment & Plan:   Problem List Items Addressed This Visit      Cardiovascular and Mediastinum   Paroxysmal atrial fibrillation ([place]) - Primary   Relevant Orders   CoaguChek XS/INR Waived       Follow up plan: Return in about 1 month (around 11/29/2017), or if symptoms worsen or fail to improve, for Prediabetes and hypertension and INR.  Counseling provided for all of the vaccine components Orders Placed This Encounter  Procedures  . CoaguChek XS/INR [place], MD [facility] 10/30/2017, 11:20 AM

## 2017-11-01 ENCOUNTER — Other Ambulatory Visit: Payer: Self-pay

## 2017-11-01 MED ORDER — WARFARIN SODIUM 5 MG PO TABS: ORAL_TABLET | ORAL | 0 refills | Status: DC

## 2017-11-05 ENCOUNTER — Other Ambulatory Visit: Payer: Self-pay | Admitting: *Deleted

## 2017-11-05 MED ORDER — DILTIAZEM HCL ER COATED BEADS 360 MG PO CP24: ORAL_CAPSULE | ORAL | 0 refills | Status: DC

## 2017-11-24 ENCOUNTER — Inpatient Hospital Stay (HOSPITAL_COMMUNITY)
Admission: EM | Admit: 2017-11-24 | Discharge: 2017-11-29 | DRG: 064 | Disposition: A | Discharge status: Home / Self Care | Payer: PPO | Attending: Internal Medicine | Admitting: Internal Medicine

## 2017-11-24 ENCOUNTER — Emergency Department (HOSPITAL_COMMUNITY): Payer: PPO

## 2017-11-24 ENCOUNTER — Encounter (HOSPITAL_COMMUNITY): Payer: Self-pay | Admitting: Emergency Medicine

## 2017-11-24 DIAGNOSIS — H548 Legal blindness, as defined in USA: Secondary | ICD-10-CM | POA: Diagnosis present

## 2017-11-24 DIAGNOSIS — I480 Paroxysmal atrial fibrillation: Secondary | ICD-10-CM

## 2017-11-24 DIAGNOSIS — E039 Hypothyroidism, unspecified: Secondary | ICD-10-CM | POA: Diagnosis not present

## 2017-11-24 DIAGNOSIS — G935 Compression of brain: Secondary | ICD-10-CM | POA: Diagnosis present

## 2017-11-24 DIAGNOSIS — G9349 Other encephalopathy: Secondary | ICD-10-CM | POA: Diagnosis not present

## 2017-11-24 DIAGNOSIS — E1169 Type 2 diabetes mellitus with other specified complication: Secondary | ICD-10-CM | POA: Diagnosis present

## 2017-11-24 DIAGNOSIS — G936 Cerebral edema: Secondary | ICD-10-CM | POA: Diagnosis not present

## 2017-11-24 DIAGNOSIS — I611 Nontraumatic intracerebral hemorrhage in hemisphere, cortical: Secondary | ICD-10-CM | POA: Diagnosis not present

## 2017-11-24 DIAGNOSIS — E785 Hyperlipidemia, unspecified: Secondary | ICD-10-CM | POA: Diagnosis not present

## 2017-11-24 DIAGNOSIS — Z8679 Personal history of other diseases of the circulatory system: Secondary | ICD-10-CM

## 2017-11-24 DIAGNOSIS — T45515A Adverse effect of anticoagulants, initial encounter: Secondary | ICD-10-CM | POA: Diagnosis present

## 2017-11-24 DIAGNOSIS — Z79890 Hormone replacement therapy (postmenopausal): Secondary | ICD-10-CM

## 2017-11-24 DIAGNOSIS — D6832 Hemorrhagic disorder due to extrinsic circulating anticoagulants: Secondary | ICD-10-CM | POA: Diagnosis not present

## 2017-11-24 DIAGNOSIS — Z79899 Other long term (current) drug therapy: Secondary | ICD-10-CM | POA: Diagnosis not present

## 2017-11-24 DIAGNOSIS — I629 Nontraumatic intracranial hemorrhage, unspecified: Secondary | ICD-10-CM

## 2017-11-24 DIAGNOSIS — I482 Chronic atrial fibrillation, unspecified: Secondary | ICD-10-CM | POA: Diagnosis not present

## 2017-11-24 DIAGNOSIS — Z7901 Long term (current) use of anticoagulants: Secondary | ICD-10-CM

## 2017-11-24 DIAGNOSIS — Z825 Family history of asthma and other chronic lower respiratory diseases: Secondary | ICD-10-CM

## 2017-11-24 DIAGNOSIS — I619 Nontraumatic intracerebral hemorrhage, unspecified: Secondary | ICD-10-CM | POA: Diagnosis not present

## 2017-11-24 DIAGNOSIS — Z821 Family history of blindness and visual loss: Secondary | ICD-10-CM | POA: Diagnosis not present

## 2017-11-24 DIAGNOSIS — Z6841 Body Mass Index (BMI) 40.0 and over, adult: Secondary | ICD-10-CM

## 2017-11-24 DIAGNOSIS — Z8249 Family history of ischemic heart disease and other diseases of the circulatory system: Secondary | ICD-10-CM

## 2017-11-24 DIAGNOSIS — I10 Essential (primary) hypertension: Secondary | ICD-10-CM | POA: Diagnosis not present

## 2017-11-24 DIAGNOSIS — I612 Nontraumatic intracerebral hemorrhage in hemisphere, unspecified: Secondary | ICD-10-CM | POA: Diagnosis not present

## 2017-11-24 DIAGNOSIS — Z9889 Other specified postprocedural states: Secondary | ICD-10-CM | POA: Diagnosis not present

## 2017-11-24 DIAGNOSIS — R4182 Altered mental status, unspecified: Secondary | ICD-10-CM | POA: Diagnosis not present

## 2017-11-24 DIAGNOSIS — Z23 Encounter for immunization: Secondary | ICD-10-CM

## 2017-11-24 DIAGNOSIS — Z794 Long term (current) use of insulin: Secondary | ICD-10-CM

## 2017-11-24 DIAGNOSIS — R40241 Glasgow coma scale score 13-15, unspecified time: Secondary | ICD-10-CM | POA: Diagnosis present

## 2017-11-24 DIAGNOSIS — Z8261 Family history of arthritis: Secondary | ICD-10-CM

## 2017-11-24 DIAGNOSIS — I618 Other nontraumatic intracerebral hemorrhage: Secondary | ICD-10-CM | POA: Diagnosis not present

## 2017-11-24 LAB — BASIC METABOLIC PANEL
Anion gap: 7 (ref 5–15)
BUN: 11 mg/dL (ref 8–23)
CO2: 27 mmol/L (ref 22–32)
Calcium: 8.9 mg/dL (ref 8.9–10.3)
Chloride: 103 mmol/L (ref 98–111)
Creatinine, Ser: 0.76 mg/dL (ref 0.61–1.24)
GFR calc Af Amer: >60 mL/min (ref >60)
GFR calc non Af Amer: >60 mL/min (ref >60)
Glucose, Bld: 86 mg/dL (ref 70–99)
Potassium: 4.2 mmol/L (ref 3.5–5.1)
Sodium: 137 mmol/L (ref 135–145)

## 2017-11-24 LAB — CBC WITH DIFFERENTIAL/PLATELET
Basophils Absolute: 0.1 10*3/uL (ref 0.0–0.1)
Basophils Relative: 1 %
Eosinophils Absolute: 0.2 10*3/uL (ref 0.0–0.7)
Eosinophils Relative: 2 %
HCT: 41.6 % (ref 39.0–52.0)
Hemoglobin: 13.9 g/dL (ref 13.0–17.0)
Lymphocytes Relative: 32 %
Lymphs Abs: 2.7 10*3/uL (ref 0.7–4.0)
MCH: 30.1 pg (ref 26.0–34.0)
MCHC: 33.4 g/dL (ref 30.0–36.0)
MCV: 90 fL (ref 78.0–100.0)
Monocytes Absolute: 1.3 10*3/uL — ABNORMAL HIGH (ref 0.1–1.0)
Monocytes Relative: 16 %
Neutro Abs: 4.2 10*3/uL (ref 1.7–7.7)
Neutrophils Relative %: 49 %
Platelets: 220 10*3/uL (ref 150–400)
RBC: 4.62 MIL/uL (ref 4.22–5.81)
RDW: 13.9 % (ref 11.5–15.5)
WBC: 8.5 10*3/uL (ref 4.0–10.5)

## 2017-11-24 LAB — GLUCOSE, CAPILLARY: Glucose-Capillary: 160 mg/dL — ABNORMAL HIGH (ref 70–99)

## 2017-11-24 LAB — PROTIME-INR
INR: 1.99
INR: 1.06
Prothrombin Time: 22.4 seconds — ABNORMAL HIGH (ref 11.4–15.2)
Prothrombin Time: 13.7 seconds (ref 11.4–15.2)

## 2017-11-24 LAB — APTT: aPTT: 39 seconds — ABNORMAL HIGH (ref 24–36)

## 2017-11-24 MED ORDER — VITAMIN K1 10 MG/ML IJ SOLN
INTRAMUSCULAR | Status: AC
  Filled 2017-11-24: qty 1

## 2017-11-24 MED ORDER — VITAMIN K1 10 MG/ML IJ SOLN
10.0000 mg | Freq: Once | INTRAVENOUS | Status: AC
  Administered 2017-11-24: 10 mg via INTRAVENOUS
  Filled 2017-11-24: qty 1

## 2017-11-24 MED ORDER — IPRATROPIUM-ALBUTEROL 0.5-2.5 (3) MG/3ML IN SOLN: 3.0000 mL | Freq: Four times a day (QID) | RESPIRATORY_TRACT | Status: DC | PRN

## 2017-11-24 MED ORDER — ATORVASTATIN CALCIUM 20 MG PO TABS
20.0000 mg | ORAL_TABLET | Freq: Every day | ORAL | Status: DC
  Administered 2017-11-26 – 2017-11-28 (×3): 20 mg via ORAL
  Filled 2017-11-24 (×3): qty 1

## 2017-11-24 MED ORDER — INSULIN ASPART 100 UNIT/ML ~~LOC~~ SOLN
3.0000 [IU] | SUBCUTANEOUS | Status: DC
  Administered 2017-11-25: 6 [IU] via SUBCUTANEOUS
  Administered 2017-11-25: 3 [IU] via SUBCUTANEOUS

## 2017-11-24 MED ORDER — MORPHINE SULFATE (PF) 4 MG/ML IV SOLN
4.0000 mg | Freq: Once | INTRAVENOUS | Status: AC
  Administered 2017-11-24: 4 mg via INTRAVENOUS
  Filled 2017-11-24: qty 1

## 2017-11-24 MED ORDER — LEVOTHYROXINE SODIUM 100 MCG PO TABS
100.0000 ug | ORAL_TABLET | Freq: Every day | ORAL | Status: DC
  Administered 2017-11-25 – 2017-11-29 (×5): 100 ug via ORAL
  Filled 2017-11-24 (×5): qty 1

## 2017-11-24 MED ORDER — PROTHROMBIN COMPLEX CONC HUMAN 500 UNITS IV KIT
2664.0000 [IU] | PACK | Freq: Once | Status: AC
  Administered 2017-11-24: 2664 [IU] via INTRAVENOUS
  Filled 2017-11-24: qty 2664

## 2017-11-24 NOTE — ED Provider Notes (Addendum)
[facility] EMERGENCY DEPARTMENT Provider Note   CSN: [id number] Arrival date & time: 11/24/17  1656     History   Chief Complaint Chief Complaint  Patient presents with  . Altered Mental Status    HPI [name] [name] is a 77 y.o. male.  HPI   77 year old male brought in by family for evaluation of changes in mental status.  Patient has a past history of subdural hematomas status post evacuation in December this past year.  Family reports that he has been chronically somnolent since that time.  In the past 3 weeks they have noticed a change in this with him being even more tired.  He will lay in bed all day and less he is encouraged to get out.  They have also noticed some personality changes.  He states that he is at times irritable and short tempered when he is normally very jovial.  He reports what sounds like some minor head trauma 2 days ago when a mechanical lift in his [employer] struck him in the head.  There is no loss of consciousness.  Denies any headaches or any acute pain elsewhere.  No acute visual changes.  No acute numbness, tingling focal loss of strength.  No fevers or chills.  No urinary complaints.  Denies any recent medication changes.  Past Medical History:  Diagnosis Date  . Arthritis   . Atrial fibrillation ([place])    previously on Coumadin, reversed with head bleed  . Cataract   . Hyperlipidemia   . HYPERTENSION   . HYPOTHYROIDISM   . 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 Pre-diabetes   . Precancerous skin lesion    on back  . ROTATOR CUFF TEAR   . Subdural hematoma [facility])     Patient Active Problem List   Diagnosis Date Noted  . Leukocytosis 02/05/2017  . Hyponatremia 02/05/2017  . Somnolence 02/05/2017  . Acute metabolic encephalopathy 02/72/5366  . Headache 01/28/2017  . Subdural hematoma ([place]) 01/23/2017  . Pre-diabetes 09/14/2015  . Paroxysmal atrial fibrillation ([place]) 03/30/2015  . HLD (hyperlipidemia) 03/02/2015  .  Healthcare maintenance 03/02/2015  . Long term current use of anticoagulant 03/30/2014  . Hypothyroidism 06/26/2008  . Essential hypertension 06/26/2008  . ROTATOR CUFF TEAR 06/26/2008  . DYSPNEA 06/26/2008    Past Surgical History:  Procedure Laterality Date  . APPENDECTOMY    . CATARACT EXTRACTION W/PHACO Right 02/02/2014   Procedure: CATARACT EXTRACTION PHACO AND INTRAOCULAR LENS PLACEMENT (IOC);  Surgeon: Elta Guadeloupe T. Gershon Crane, MD;  Location: [facility];  Service: Ophthalmology;  Laterality: Right;  CDE 12.17  . CATARACT EXTRACTION W/PHACO Left 02/16/2014   Procedure: CATARACT EXTRACTION PHACO AND INTRAOCULAR LENS PLACEMENT ;  Surgeon: Elta Guadeloupe T. Gershon Crane, MD;  Location: [facility];  Service: Ophthalmology;  Laterality: Left;  CDE:13.42  . CRANIOTOMY N/A 02/05/2017   Procedure: Bilateral Burr Holes . Craniotomy for Subdural;  Surgeon: Ditty, Kevan Ny, MD;  Location: [facility];  Service: Neurosurgery;  Laterality: N/A;  . EYE SURGERY  2015   cataract surgery. bilateral  . JOINT REPLACEMENT  2009   bilateral knee replacement  . KNEE SURGERY    . ROTATOR CUFF REPAIR Left   . SHOULDER SURGERY    . TOTAL KNEE ARTHROPLASTY Bilateral         Home Medications    Prior to Admission medications   Medication Sig Start Date End Date Taking? Authorizing Provider  albuterol (PROVENTIL HFA;VENTOLIN HFA) 108 (  90 Base) MCG/ACT inhaler Inhale 2 puffs into the lungs every 6 (six) hours as needed for wheezing or shortness of breath. 06/29/16   Eustaquio Maize, MD  atorvastatin (LIPITOR) 10 MG tablet TAKE 1 TABLET (10 MG TOTAL) BY MOUTH DAILY. 09/17/17   Timmothy Euler, MD  diltiazem (CARDIZEM CD) 360 MG 24 hr capsule TAKE 1 CAPSULE (360 MG TOTAL) BY MOUTH DAILY. 11/05/17   Dettinger, [name], MD  flecainide (TAMBOCOR) 100 MG tablet TAKE 1 TABLET (100 MG TOTAL) BY MOUTH 2 (TWO) TIMES DAILY. 08/23/17   Timmothy Euler, MD  levothyroxine (SYNTHROID, LEVOTHROID) 50 MCG tablet TAKE 2 TABLETS BY MOUTH DAILY  07/29/17   Timmothy Euler, MD  lisinopril (PRINIVIL,ZESTRIL) 20 MG tablet TAKE 1 TABLET (20 MG TOTAL) BY MOUTH DAILY. 08/23/17   Timmothy Euler, MD  metFORMIN (GLUCOPHAGE-XR) 500 MG 24 hr tablet TAKE 1 TABLET (500 MG TOTAL) BY MOUTH DAILY WITH BREAKFAST. 10/22/17   Dettinger, [name], MD  warfarin (COUMADIN) 5 MG tablet Take 1/2 tab Mon, Wed and Fri and 1 tab on all other days 11/01/17   Dettinger, [name], MD    Family History Family History  Problem Relation Age of Onset  . Heart attack Brother   . Early death Brother   . Asthma Father   . Vision loss Father   . Hypertension Mother   . Heart attack Brother 67  . Arthritis Brother     Social History Social History   Tobacco Use  . Smoking status: Never Smoker  . Smokeless tobacco: Never Used  Substance Use Topics  . Alcohol use: No    Frequency: Never    Comment: remote use, h/o heavy binge use about 25 years ago  . Drug use: No    Comment: remote h/o marijuana use     Allergies   Patient has no known allergies.   Review of Systems Review of Systems  All systems reviewed and negative, other than as noted in HPI.  Physical Exam Updated Vital Signs BP 118/66   Pulse 62   Temp 98.8 F (37.1 C) (Oral)   Resp 20   Ht 5\' 10"  (1.778 m)   Wt 130.2 kg   SpO2 98%   BMI 41.18 kg/m   Physical Exam  Constitutional: He appears well-developed and well-nourished. No distress.  HENT:  Head: Normocephalic and atraumatic.  Eyes: Conjunctivae are normal. Right eye exhibits no discharge. Left eye exhibits no discharge.  Neck: Neck supple.  Cardiovascular: Normal rate, regular rhythm and normal heart sounds. Exam reveals no gallop and no friction rub.  No murmur heard. Pulmonary/Chest: Effort normal and breath sounds normal. No respiratory distress.  Abdominal: Soft. He exhibits no distension. There is no tenderness.  Musculoskeletal: He exhibits no edema or tenderness.  Neurological: He is alert.  Skin: Skin is warm  and dry.  Psychiatric: He has a normal mood and affect. His behavior is normal. Thought content normal.  Nursing note and vitals reviewed.    ED Treatments / Results  Labs (all labs ordered are listed, but only abnormal results are displayed) Labs Reviewed  CBC WITH DIFFERENTIAL/PLATELET - Abnormal; Notable for the following components:      Result Value   Monocytes Absolute 1.3 (*)    All other components within normal limits  PROTIME-INR - Abnormal; Notable for the following components:   Prothrombin Time 22.4 (*)    All other components within normal limits  APTT - Abnormal; Notable for  the following components:   aPTT 39 (*)    All other components within normal limits  BASIC METABOLIC PANEL  URINALYSIS, ROUTINE W REFLEX MICROSCOPIC    EKG EKG Interpretation  Date/Time:  Sunday November 24 2017 17:23:04 EDT Ventricular Rate:  66 PR Interval:    QRS Duration: 118 QT Interval:  409 QTC Calculation: 429 R Axis:   34 Text Interpretation:  Sinus rhythm Prolonged PR interval Nonspecific intraventricular conduction delay Confirmed by Virgel Manifold [phone]) on 11/24/2017 5:32:11 PM   Radiology Ct Head Wo Contrast  Result Date: 11/24/2017 CLINICAL DATA:  Altered mental status. EXAM: CT HEAD WITHOUT CONTRAST TECHNIQUE: Contiguous axial images were obtained from the base of the skull through the vertex without intravenous contrast. COMPARISON:  None. FINDINGS: Brain: There is multifocal intraparenchymal hemorrhage. The larger focus is located in the right frontal lobe and measures 5.3 x 3.6 x 4.5 cm (volume = 45 cm^3). Focus in the left frontal lobe measures 1.8 cm. A more inferior right frontal focus measures 10 mm. There is anterior midline shift to the left measuring 6 mm at the level of the foramina of [place]. Brain parenchyma is otherwise normal. Vascular: No abnormal hyperdensity of the major intracranial arteries or dural venous sinuses. No intracranial atherosclerosis. Skull:  Old bilateral craniotomies. Sinuses/Orbits: No fluid levels or advanced mucosal thickening of the visualized paranasal sinuses. No mastoid or middle ear effusion. The orbits are normal. IMPRESSION: 1. Multifocal acute intraparenchymal hemorrhage, largest in the right frontal lobe, measuring 45 mL. The multifocality of emerges, while possibly due to hypertension, raise suspicion for metastatic disease. MRI of the brain with and without contrast is recommended, when possible, to assess for possible underlying lesions. 2. The appearance of the hematomata suggests that they are of mixed acuity with some components in the early subacute phase, which is compatible with the reported timeline of the patient's symptoms over days to weeks. 3. Leftward midline shift measuring 6 mm. Critical Value/emergent results were called by telephone at the time of interpretation on 11/24/2017 at 6:51 pm to Dr. Virgel Manifold , who verbally acknowledged these results. Electronically Signed   By: Ulyses Jarred M.D.   On: 11/24/2017 18:55    Procedures Procedures (including critical care time)  CRITICAL CARE Performed by: Virgel Manifold Total critical care time: 40 minutes Critical care time was exclusive of separately billable procedures and treating other patients. Critical care was necessary to treat or prevent imminent or life-threatening deterioration. Critical care was time spent personally by me on the following activities: development of treatment plan with patient and/or surrogate as well as nursing, discussions with consultants, evaluation of patient's response to treatment, examination of patient, obtaining history from patient or surrogate, ordering and performing treatments and interventions, ordering and review of laboratory studies, ordering and review of radiographic studies, pulse oximetry and re-evaluation of patient's condition.   Medications Ordered in ED Medications  prothrombin complex conc human (KCENTRA)  IVPB 2,000 Units (has no administration in time range)  phytonadione (VITAMIN K) 10 mg in dextrose 5 % 50 mL IVPB (has no administration in time range)     Initial Impression / Assessment and Plan / ED Course  I have reviewed the triage vital signs and the nursing notes.  Pertinent labs & imaging results that were available during my care of the patient were reviewed by me and considered in my medical decision making (see chart for details).  Clinical Course as of Nov 25 1356  Sun Nov 24, 2017  2134 Reassessed while awaiting transport. Says he has developed a mild headache. He has no other additional complaints. Had Vit K/kcentra. Will continue to monitor. Advised to let us know if it worsens or he develops any additional symptoms.    [SK]    Clinical Course User Index [SK] Virgel Manifold, MD    77 year old male with changes in mental status which are primarily increased somnolence and some personality changes.  Patient himself really has no complaints although he does acknowledge that he is a little bit more tired than typical for him.  His neuro exam is nonfocal.  Unfortunately, CT showing multiple areas of hemorrhage.  He is on Coumadin for history of atrial fibrillation.  This will need to be reversed.  His blood pressure has been good thus far. No known malignancy. He describes what sounds like minor head trauma two days ago but was already having symptoms prior to this and doubtful these all these bleeds are traumatic. Will discuss with neurosurgery.  Anticipate transfer to [facility].   7:56 PM Discussed with Dr Arnoldo Morale, neurosurgery. Will admit to CCM. Transfer to [facility].   Final Clinical Impressions(s) / ED Diagnoses   Final diagnoses:  Intracranial bleeding [facility])  Warfarin anticoagulation    ED Discharge Orders    None         Virgel Manifold, MD 11/24/17 2007    Virgel Manifold, MD 11/25/17 1358

## 2017-11-24 NOTE — ED Triage Notes (Signed)
Wife states pt has been acting strange for about a week and it worsened after the back of the [employer] came down and hit pt on head Friday afternoon. Wife states the last time the pt acted like this he had bleeding on brain.  Wife states pt has been sleeping more than usual.
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
## 2017-11-25 ENCOUNTER — Other Ambulatory Visit: Payer: Self-pay

## 2017-11-25 DIAGNOSIS — Z7901 Long term (current) use of anticoagulants: Secondary | ICD-10-CM

## 2017-11-25 DIAGNOSIS — Z8679 Personal history of other diseases of the circulatory system: Secondary | ICD-10-CM

## 2017-11-25 DIAGNOSIS — Z9889 Other specified postprocedural states: Secondary | ICD-10-CM

## 2017-11-25 LAB — BASIC METABOLIC PANEL
Anion gap: 7 (ref 5–15)
BUN: 9 mg/dL (ref 8–23)
CHLORIDE: 104 mmol/L (ref 98–111)
CO2: 25 mmol/L (ref 22–32)
Calcium: 8.5 mg/dL — ABNORMAL LOW (ref 8.9–10.3)
Creatinine, Ser: 0.79 mg/dL (ref 0.61–1.24)
GFR calc Af Amer: >60 mL/min (ref >60)
GFR calc non Af Amer: >60 mL/min (ref >60)
GLUCOSE: 120 mg/dL — AB (ref 70–99)
POTASSIUM: 3.7 mmol/L (ref 3.5–5.1)
Sodium: 136 mmol/L (ref 135–145)

## 2017-11-25 LAB — GLUCOSE, CAPILLARY
GLUCOSE-CAPILLARY: 123 mg/dL — AB (ref 70–99)
Glucose-Capillary: 113 mg/dL — ABNORMAL HIGH (ref 70–99)
Glucose-Capillary: 104 mg/dL — ABNORMAL HIGH (ref 70–99)
Glucose-Capillary: 120 mg/dL — ABNORMAL HIGH (ref 70–99)
Glucose-Capillary: 173 mg/dL — ABNORMAL HIGH (ref 70–99)

## 2017-11-25 LAB — CBC WITH DIFFERENTIAL/PLATELET
ABS IMMATURE GRANULOCYTES: 0 10*3/uL (ref 0.0–0.1)
Basophils Absolute: 0.1 10*3/uL (ref 0.0–0.1)
Basophils Relative: 1 %
Eosinophils Absolute: 0.2 10*3/uL (ref 0.0–0.7)
Eosinophils Relative: 2 %
HEMATOCRIT: 42.2 % (ref 39.0–52.0)
Hemoglobin: 13.7 g/dL (ref 13.0–17.0)
Immature Granulocytes: 0 %
Lymphocytes Relative: 35 %
Lymphs Abs: 2.9 10*3/uL (ref 0.7–4.0)
MCH: 29.1 pg (ref 26.0–34.0)
MCHC: 32.5 g/dL (ref 30.0–36.0)
MCV: 89.6 fL (ref 78.0–100.0)
MONO ABS: 1.1 10*3/uL — AB (ref 0.1–1.0)
MONOS PCT: 13 %
NEUTROS ABS: 4.1 10*3/uL (ref 1.7–7.7)
Neutrophils Relative %: 49 %
Platelets: 219 10*3/uL (ref 150–400)
RBC: 4.71 MIL/uL (ref 4.22–5.81)
RDW: 13.5 % (ref 11.5–15.5)
WBC: 8.3 10*3/uL (ref 4.0–10.5)

## 2017-11-25 LAB — URINALYSIS, ROUTINE W REFLEX MICROSCOPIC
BILIRUBIN URINE: NEGATIVE
Glucose, UA: NEGATIVE mg/dL
HGB URINE DIPSTICK: NEGATIVE
Ketones, ur: NEGATIVE mg/dL
Leukocytes, UA: NEGATIVE
Nitrite: NEGATIVE
PROTEIN: NEGATIVE mg/dL
Specific Gravity, Urine: 1.011 (ref 1.005–1.030)
pH: 6 (ref 5.0–8.0)

## 2017-11-25 LAB — PROTIME-INR
INR: 1.04
INR: 1.11
Prothrombin Time: 13.5 seconds (ref 11.4–15.2)
Prothrombin Time: 14.2 seconds (ref 11.4–15.2)

## 2017-11-25 LAB — PHOSPHORUS: Phosphorus: 3 mg/dL (ref 2.5–4.6)

## 2017-11-25 LAB — MRSA PCR SCREENING: MRSA by PCR: NEGATIVE

## 2017-11-25 LAB — MAGNESIUM: MAGNESIUM: 2.1 mg/dL (ref 1.7–2.4)

## 2017-11-25 LAB — HEMOGLOBIN A1C
Hgb A1c MFr Bld: 6.1 % — ABNORMAL HIGH (ref 4.8–5.6)
Mean Plasma Glucose: 128.37 mg/dL

## 2017-11-25 LAB — TSH: TSH: 3.109 u[IU]/mL (ref 0.350–4.500)

## 2017-11-25 MED ORDER — INSULIN ASPART 100 UNIT/ML ~~LOC~~ SOLN
0.0000 [IU] | Freq: Three times a day (TID) | SUBCUTANEOUS | Status: DC
  Administered 2017-11-28: 2 [IU] via SUBCUTANEOUS

## 2017-11-25 MED ORDER — INFLUENZA VAC SPLIT HIGH-DOSE 0.5 ML IM SUSY
0.5000 mL | PREFILLED_SYRINGE | INTRAMUSCULAR | Status: AC
  Administered 2017-11-26: 0.5 mL via INTRAMUSCULAR
  Filled 2017-11-25 (×2): qty 0.5

## 2017-11-25 MED ORDER — FLECAINIDE ACETATE 100 MG PO TABS
100.0000 mg | ORAL_TABLET | Freq: Two times a day (BID) | ORAL | Status: DC
  Administered 2017-11-25 – 2017-11-29 (×8): 100 mg via ORAL
  Filled 2017-11-25 (×8): qty 1

## 2017-11-25 MED ORDER — INSULIN ASPART 100 UNIT/ML ~~LOC~~ SOLN: 0.0000 [IU] | Freq: Every day | SUBCUTANEOUS | Status: DC

## 2017-11-25 MED ORDER — METFORMIN HCL ER 500 MG PO TB24
500.0000 mg | ORAL_TABLET | Freq: Every day | ORAL | Status: DC
  Administered 2017-11-26 – 2017-11-29 (×4): 500 mg via ORAL
  Filled 2017-11-25 (×4): qty 1

## 2017-11-25 NOTE — Consult Note (Signed)
Reason for Consult: Intracerebral hemorrhage Referring Physician: Dr. Blair Heys [name] [name] is an 77 y.o. male.  HPI: The patient is a 77 year old white male with a history of atrial fibrillation and consequently on Coumadin.  The patient had subdural hematoma and had bilateral surgery/bur holes by Dr. Cyndy Freeze in December 2018.  The patient was subsequently restarted on Coumadin.  The patient has had some mental status changes.  He was brought to the [facility] ER.  A CT scan was obtained which demonstrated bilateral frontal intracerebral hemorrhages.  Patient's Coumadin anticoagulation was reversed with Kcentra.  He was transferred to [facility] intensive care service.  A neurosurgical consultation was requested.  Presently the patient is alert and pleasant.  He has no complaints.  There is a reported history of drinking he has had a few days ago.  Past Medical History:  Diagnosis Date  . Arthritis   . Atrial fibrillation ([place])    previously on Coumadin, reversed with head bleed  . Cataract   . Hyperlipidemia   . HYPERTENSION   . HYPOTHYROIDISM   . 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 Pre-diabetes   . Precancerous skin lesion    on back  . ROTATOR CUFF TEAR   . Subdural hematoma [facility])     Past Surgical History:  Procedure Laterality Date  . APPENDECTOMY    . CATARACT EXTRACTION W/PHACO Right 02/02/2014   Procedure: CATARACT EXTRACTION PHACO AND INTRAOCULAR LENS PLACEMENT (IOC);  Surgeon: Elta Guadeloupe T. Gershon Crane, MD;  Location: [facility];  Service: Ophthalmology;  Laterality: Right;  CDE 12.17  . CATARACT EXTRACTION W/PHACO Left 02/16/2014   Procedure: CATARACT EXTRACTION PHACO AND INTRAOCULAR LENS PLACEMENT ;  Surgeon: Elta Guadeloupe T. Gershon Crane, MD;  Location: [facility];  Service: Ophthalmology;  Laterality: Left;  CDE:13.42  . CRANIOTOMY N/A 02/05/2017   Procedure: Bilateral Burr Holes . Craniotomy for Subdural;  Surgeon: Ditty, Kevan Ny, MD;  Location: [place];   Service: Neurosurgery;  Laterality: N/A;  . EYE SURGERY  2015   cataract surgery. bilateral  . JOINT REPLACEMENT  2009   bilateral knee replacement  . KNEE SURGERY    . ROTATOR CUFF REPAIR Left   . SHOULDER SURGERY    . TOTAL KNEE ARTHROPLASTY Bilateral     Family History  Problem Relation Age of Onset  . Heart attack Brother   . Early death Brother   . Asthma Father   . Vision loss Father   . Hypertension Mother   . Heart attack Brother 37  . Arthritis Brother     Social History:  reports that he has never smoked. He has never used smokeless tobacco. He reports that he does not drink alcohol or use drugs.  Allergies: No Known Allergies  Medications:  I have reviewed the patient's current medications. Prior to Admission:  Medications Prior to Admission  Medication Sig Dispense Refill Last Dose  . albuterol (PROVENTIL HFA;VENTOLIN HFA) 108 (90 Base) MCG/ACT inhaler Inhale 2 puffs into the lungs every 6 (six) hours as needed for wheezing or shortness of breath. 1 Inhaler 2 >30 days  . atorvastatin (LIPITOR) 10 MG tablet TAKE 1 TABLET (10 MG TOTAL) BY MOUTH DAILY. (Patient taking differently: Take 10 mg by mouth every morning. ) 90 tablet 0 11/24/2017 at Unknown time  . diltiazem (CARDIZEM CD) 360 MG 24 hr capsule TAKE 1 CAPSULE (360 MG TOTAL) BY MOUTH DAILY. (Patient taking differently: Take 360 mg by mouth  every evening. ) 90 capsule 0 11/23/2017 at Unknown time  . flecainide (TAMBOCOR) 100 MG tablet TAKE 1 TABLET (100 MG TOTAL) BY MOUTH 2 (TWO) TIMES DAILY. (Patient taking differently: Take 100 mg by mouth 2 (two) times daily. ) 180 tablet 1 11/24/2017 at Unknown time  . levothyroxine (SYNTHROID, LEVOTHROID) 50 MCG tablet TAKE 2 TABLETS BY MOUTH DAILY (Patient taking differently: Take 100 mcg by mouth every morning. ) 180 tablet 2 11/24/2017 at Unknown time  . lisinopril (PRINIVIL,ZESTRIL) 20 MG tablet TAKE 1 TABLET (20 MG TOTAL) BY MOUTH DAILY. (Patient taking differently: Take 20 mg  by mouth every morning. ) 90 tablet 1 11/24/2017 at Unknown time  . metFORMIN (GLUCOPHAGE-XR) 500 MG 24 hr tablet TAKE 1 TABLET (500 MG TOTAL) BY MOUTH DAILY WITH BREAKFAST. 90 tablet 0 11/24/2017 at Unknown time  . warfarin (COUMADIN) 5 MG tablet Take 1/2 tab Mon, Wed and Fri and 1 tab on all other days (Patient taking differently: Take 2.5-5 mg by mouth See admin instructions. Take 1/2 tab (2.74m total) Mon, Wed and Fri and 1 tab (540mtotal) on all other days) 90 tablet 0 11/24/2017 at 200a   Scheduled: . atorvastatin  20 mg Oral q1800  . insulin aspart  3-9 Units Subcutaneous Q4H  . levothyroxine  100 mcg Oral QAC breakfast   Continuous:  PRYIF:OYDXAJOINOM-VEHMCNOBSnti-infectives (From admission, onward)   None       Results for orders placed or performed during the hospital encounter of 11/24/17 (from the past 48 hour(s))  CBC with Differential     Status: Abnormal   Collection Time: 11/24/17  5:35 PM  Result Value Ref Range   WBC 8.5 4.0 - 10.5 K/uL   RBC 4.62 4.22 - 5.81 MIL/uL   Hemoglobin 13.9 13.0 - 17.0 g/dL   HCT 41.6 39.0 - 52.0 %   MCV 90.0 78.0 - 100.0 fL   MCH 30.1 26.0 - 34.0 pg   MCHC 33.4 30.0 - 36.0 g/dL   RDW 13.9 11.5 - 15.5 %   Platelets 220 150 - 400 K/uL   Neutrophils Relative % 49 %   Neutro Abs 4.2 1.7 - 7.7 K/uL   Lymphocytes Relative 32 %   Lymphs Abs 2.7 0.7 - 4.0 K/uL   Monocytes Relative 16 %   Monocytes Absolute 1.3 (H) 0.1 - 1.0 K/uL   Eosinophils Relative 2 %   Eosinophils Absolute 0.2 0.0 - 0.7 K/uL   Basophils Relative 1 %   Basophils Absolute 0.1 0.0 - 0.1 K/uL    Comment: Performed at [facility] [place] [id number] metabolic panel     Status: None   Collection Time: 11/24/17  5:35 PM  Result Value Ref Range   Sodium 137 135 - 145 mmol/L   Potassium 4.2 3.5 - 5.1 mmol/L   Chloride 103 98 - 111 mmol/L   CO2 27 22 - 32 mmol/L   Glucose, Bld 86 70 - 99 mg/dL   BUN 11 8 - 23 mg/dL   Creatinine, Ser 0.76  0.61 - 1.24 mg/dL   Calcium 8.9 8.9 - 10.3 mg/dL   GFR calc non Af Amer >60 >60 mL/min   GFR calc Af Amer >60 >60 mL/min    Comment: (NOTE) The eGFR has been calculated using the CKD EPI equation. This calculation has not been validated in all clinical situations. eGFR's persistently <60 mL/min signify possible Chronic Kidney Disease.    Anion gap 7 5 -  15    Comment: Performed at [facility], [address], [place], [place] [address]  Protime-INR     Status: Abnormal   Collection Time: 11/24/17  5:44 PM  Result Value Ref Range   Prothrombin Time 22.4 (H) 11.4 - 15.2 seconds   INR 1.99     Comment: Performed at [facility], [address], [place], [place] [address]  APTT     Status: Abnormal   Collection Time: 11/24/17  5:44 PM  Result Value Ref Range   aPTT 39 (H) 24 - 36 seconds    Comment:        IF BASELINE aPTT IS ELEVATED, SUGGEST PATIENT RISK ASSESSMENT BE USED TO DETERMINE APPROPRIATE ANTICOAGULANT THERAPY. Performed at [facility], [address], [place], [place] [address]   Protime-INR     Status: None   Collection Time: 11/24/17  9:42 PM  Result Value Ref Range   Prothrombin Time 13.7 11.4 - 15.2 seconds   INR 1.06     Comment: Performed at [facility], [address] [place], [place] [address]  MRSA PCR Screening     Status: None   Collection Time: 11/24/17 10:46 PM  Result Value Ref Range   MRSA by PCR NEGATIVE NEGATIVE    Comment:        The GeneXpert MRSA Assay (FDA approved for NASAL specimens only), is one component of a comprehensive MRSA colonization surveillance program. It is not intended to diagnose MRSA infection nor to guide or monitor treatment for MRSA infections. Performed at [facility], [place] [address] [place], [place] [address]   Glucose, capillary     Status: Abnormal   Collection Time: 11/24/17 11:35 PM  Result Value Ref Range   Glucose-Capillary 160 (H) 70 - 99 mg/dL  CBC with Differential/Platelet     Status: Abnormal    Collection Time: 11/25/17  2:40 AM  Result Value Ref Range   WBC 8.3 4.0 - 10.5 K/uL   RBC 4.71 4.22 - 5.81 MIL/uL   Hemoglobin 13.7 13.0 - 17.0 g/dL   HCT 42.2 39.0 - 52.0 %   MCV 89.6 78.0 - 100.0 fL   MCH 29.1 26.0 - 34.0 pg   MCHC 32.5 30.0 - 36.0 g/dL   RDW 13.5 11.5 - 15.5 %   Platelets 219 150 - 400 K/uL   Neutrophils Relative % 49 %   Neutro Abs 4.1 1.7 - 7.7 K/uL   Lymphocytes Relative 35 %   Lymphs Abs 2.9 0.7 - 4.0 K/uL   Monocytes Relative 13 %   Monocytes Absolute 1.1 (H) 0.1 - 1.0 K/uL   Eosinophils Relative 2 %   Eosinophils Absolute 0.2 0.0 - 0.7 K/uL   Basophils Relative 1 %   Basophils Absolute 0.1 0.0 - 0.1 K/uL   Immature Granulocytes 0 %   Abs Immature Granulocytes 0.0 0.0 - 0.1 K/uL    Comment: Performed at [facility], [place] [address], [place], [place] [address]  Basic metabolic panel     Status: Abnormal   Collection Time: 11/25/17  2:40 AM  Result Value Ref Range   Sodium 136 135 - 145 mmol/L   Potassium 3.7 3.5 - 5.1 mmol/L   Chloride 104 98 - 111 mmol/L   CO2 25 22 - 32 mmol/L   Glucose, Bld 120 (H) 70 - 99 mg/dL   BUN 9 8 - 23 mg/dL   Creatinine, Ser 0.79 0.61 - 1.24 mg/dL   Calcium 8.5 (L)  8.9 - 10.3 mg/dL   GFR calc non Af Amer >60 >60 mL/min   GFR calc Af Amer >60 >60 mL/min    Comment: (NOTE) The eGFR has been calculated using the CKD EPI equation. This calculation has not been validated in all clinical situations. eGFR's persistently <60 mL/min signify possible Chronic Kidney Disease.    Anion gap 7 5 - 15    Comment: Performed at [place] [address], [place], [place] [address]  Magnesium     Status: None   Collection Time: 11/25/17  2:40 AM  Result Value Ref Range   Magnesium 2.1 1.7 - 2.4 mg/dL    Comment: Performed at [facility] [address] [place], [place] [address]  Phosphorus     Status: None   Collection Time: 11/25/17  2:40 AM  Result Value Ref Range   Phosphorus 3.0 2.5 - 4.6 mg/dL     Comment: Performed at [facility] [address] [place], [place] [address]  Hemoglobin A1c     Status: Abnormal   Collection Time: 11/25/17  2:40 AM  Result Value Ref Range   Hgb A1c MFr Bld 6.1 (H) 4.8 - 5.6 %    Comment: (NOTE) Pre diabetes:          5.7%-6.4% Diabetes:              >6.4% Glycemic control for   <7.0% adults with diabetes    Mean Plasma Glucose 128.37 mg/dL    Comment: Performed at [facility] [address] [place], [place] [address]  TSH     Status: None   Collection Time: 11/25/17  2:40 AM  Result Value Ref Range   TSH 3.109 0.350 - 4.500 uIU/mL    Comment: Performed by a 3rd Generation assay with a functional sensitivity of <=0.01 uIU/mL. Performed at [facility], [place] [address], [place], [place] [address]   Protime-INR     Status: None   Collection Time: 11/25/17  2:40 AM  Result Value Ref Range   Prothrombin Time 13.5 11.4 - 15.2 seconds   INR 1.04     Comment: Performed at [facility] [address] [place], [place] [address]  Glucose, capillary     Status: Abnormal   Collection Time: 11/25/17  3:35 AM  Result Value Ref Range   Glucose-Capillary 113 (H) 70 - 99 mg/dL  Urinalysis, Routine w reflex microscopic     Status: None   Collection Time: 11/25/17  6:14 AM  Result Value Ref Range   Color, Urine YELLOW YELLOW   APPearance CLEAR CLEAR   Specific Gravity, Urine 1.011 1.005 - 1.030   pH 6.0 5.0 - 8.0   Glucose, UA NEGATIVE NEGATIVE mg/dL   Hgb urine dipstick NEGATIVE NEGATIVE   Bilirubin Urine NEGATIVE NEGATIVE   Ketones, ur NEGATIVE NEGATIVE mg/dL   Protein, ur NEGATIVE NEGATIVE mg/dL   Nitrite NEGATIVE NEGATIVE   Leukocytes, UA NEGATIVE NEGATIVE    Comment: Performed at [facility] [address], [place], [place] [address]    Ct Head Wo Contrast  Result Date: 11/24/2017 CLINICAL DATA:  Altered mental status. EXAM: CT HEAD WITHOUT CONTRAST TECHNIQUE: Contiguous axial images were obtained from the  base of the skull through the vertex without intravenous contrast. COMPARISON:  None. FINDINGS: Brain: There is multifocal intraparenchymal hemorrhage. The larger focus is located in the right frontal lobe and measures 5.3 x 3.6 x  4.5 cm (volume = 45 cm^3). Focus in the left frontal lobe measures 1.8 cm. A more inferior right frontal focus measures 10 mm. There is anterior midline shift to the left measuring 6 mm at the level of the foramina of [place]. Brain parenchyma is otherwise normal. Vascular: No abnormal hyperdensity of the major intracranial arteries or dural venous sinuses. No intracranial atherosclerosis. Skull: Old bilateral craniotomies. Sinuses/Orbits: No fluid levels or advanced mucosal thickening of the visualized paranasal sinuses. No mastoid or middle ear effusion. The orbits are normal. IMPRESSION: 1. Multifocal acute intraparenchymal hemorrhage, largest in the right frontal lobe, measuring 45 mL. The multifocality of emerges, while possibly due to hypertension, raise suspicion for metastatic disease. MRI of the brain with and without contrast is recommended, when possible, to assess for possible underlying lesions. 2. The appearance of the hematomata suggests that they are of mixed acuity with some components in the early subacute phase, which is compatible with the reported timeline of the patient's symptoms over days to weeks. 3. Leftward midline shift measuring 6 mm. Critical Value/emergent results were called by telephone at the time of interpretation on 11/24/2017 at 6:51 pm to Dr. Virgel Manifold , who verbally acknowledged these results. Electronically Signed   By: Ulyses Jarred M.D.   On: 11/24/2017 18:55    ROS: As above Blood pressure 113/67, pulse 60, temperature 97.7 F (36.5 C), temperature source Oral, resp. rate 15, height 5' 10"  (1.778 m), weight 131.3 kg, SpO2 98 %. Estimated body mass index is 41.53 kg/m as calculated from the following:   Height as of this encounter: 5' 10"   (1.778 m).   Weight as of this encounter: 131.3 kg.  Physical Exam  General: An alert and pleasant 77 year old obese white male in no apparent distress  HEENT: The patient's bilateral bur hole incisions are well-healed.  His pupils are equal round reactive light, extraocular muscles are intact.  Neck: Supple with a age-appropriate normal range of motion  Thorax: Symmetric  Abdomen: Obese  Extremities: Unremarkable  Neurologic exam: The patient is alert and oriented x3.  Glasgow Coma Scale 15.  Cranial nerves II through XII were examined bilaterally grossly normal.  Vision and hearing are grossly normal bilaterally.  The patient's motor strength is 5/5 in his bilateral bicep, tricep, handgrip, gastrocnemius, and dorsiflexors.  Cerebellar function is intact to rapid alternating movements of the upper extremities bilaterally.  Sensory function is intact to light touch sensation all tested dermatomes bilaterally.  Imaging studies: I have reviewed the patient's head CT scan performed at [facility] yesterday.  He has a fairly large right frontal internal cerebral hemorrhage.  There is a smaller left frontal and inferior frontal hemorrhage.  There is moderate mass-effect.  Assessment/Plan: Intracerebral hemorrhage, Coumadin anticoagulation, atrial fibrillation: The patient has a fairly large right frontal intracerebral hemorrhage.  The 2 smaller  hematomas are unlikely to require intervention.  However he is doing great clinically.  His Coumadin anticoagulation has been reversed.  A follow-up head CT scan has been ordered.  We will continue to observe the patient for clinical deterioration in which case he would need surgery..  Hopefully these hematomas will resolve without surgery.  This patient does not seem like a good candidate for Coumadin anticoagulation in the future.  Ophelia Charter 11/25/2017, 6:40 AM
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
## 2017-11-26 ENCOUNTER — Inpatient Hospital Stay (HOSPITAL_COMMUNITY): Payer: PPO

## 2017-11-26 LAB — BASIC METABOLIC PANEL
ANION GAP: 11 (ref 5–15)
BUN: 13 mg/dL (ref 8–23)
CO2: 25 mmol/L (ref 22–32)
Calcium: 9 mg/dL (ref 8.9–10.3)
Chloride: 102 mmol/L (ref 98–111)
Creatinine, Ser: 0.9 mg/dL (ref 0.61–1.24)
GFR calc Af Amer: >60 mL/min (ref >60)
GLUCOSE: 100 mg/dL — AB (ref 70–99)
Potassium: 4.2 mmol/L (ref 3.5–5.1)
Sodium: 138 mmol/L (ref 135–145)

## 2017-11-26 LAB — GLUCOSE, CAPILLARY
GLUCOSE-CAPILLARY: 107 mg/dL — AB (ref 70–99)
GLUCOSE-CAPILLARY: 135 mg/dL — AB (ref 70–99)
Glucose-Capillary: 117 mg/dL — ABNORMAL HIGH (ref 70–99)
Glucose-Capillary: 93 mg/dL (ref 70–99)

## 2017-11-26 LAB — CBC
HCT: 45.3 % (ref 39.0–52.0)
Hemoglobin: 14.9 g/dL (ref 13.0–17.0)
MCH: 29.4 pg (ref 26.0–34.0)
MCHC: 32.9 g/dL (ref 30.0–36.0)
MCV: 89.5 fL (ref 78.0–100.0)
Platelets: 236 10*3/uL (ref 150–400)
RBC: 5.06 MIL/uL (ref 4.22–5.81)
RDW: 13.4 % (ref 11.5–15.5)
WBC: 8 10*3/uL (ref 4.0–10.5)

## 2017-11-26 LAB — PROTIME-INR
INR: 1.01
PROTHROMBIN TIME: 13.2 s (ref 11.4–15.2)

## 2017-11-26 LAB — MAGNESIUM: Magnesium: 2.1 mg/dL (ref 1.7–2.4)

## 2017-11-26 MED ORDER — SENNOSIDES-DOCUSATE SODIUM 8.6-50 MG PO TABS
2.0000 | ORAL_TABLET | Freq: Every evening | ORAL | Status: DC | PRN
  Filled 2017-11-26: qty 2

## 2017-11-26 MED ORDER — SENNOSIDES-DOCUSATE SODIUM 8.6-50 MG PO TABS
2.0000 | ORAL_TABLET | Freq: Once | ORAL | Status: AC
  Administered 2017-11-26: 2 via ORAL
  Filled 2017-11-26: qty 2

## 2017-11-26 MED ORDER — GADOBUTROL 1 MMOL/ML IV SOLN
10.0000 mL | Freq: Once | INTRAVENOUS | Status: AC | PRN
  Administered 2017-11-26: 10 mL via INTRAVENOUS

## 2017-11-26 NOTE — Progress Notes (Signed)
Subjective: The patient is alert and pleasant.  He is in no apparent distress.  Objective: Vital signs in last 24 hours: Temp:  [97.7 F (36.5 C)-98.3 F (36.8 C)] 97.7 F (36.5 C) (10/01 0400) Pulse Rate:  [25-74] 57 (10/01 0600) Resp:  [10-28] 14 (10/01 0713) BP: (114-139)/(48-105) 119/99 (10/01 0713) SpO2:  [84 %-100 %] 95 % (10/01 0600) Estimated body mass index is 41.53 kg/m as calculated from the following:   Height as of this encounter: 5\' 10"  (1.778 m).   Weight as of this encounter: 131.3 kg.   Intake/Output from previous day: 09/30 0701 - 10/01 0700 In: -  Out: 2000 [Urine:2000] Intake/Output this shift: No intake/output data recorded.  Physical exam patient is alert and oriented x3.  Glasgow Coma Scale 15.  His speech and strength is normal.  I have reviewed the patient's brain MRI performed this morning.  It demonstrates bifrontal hematomas without any significant change from his CAT scan.  Lab Results: Recent Labs    11/25/17 0240 11/26/17 0353  WBC 8.3 8.0  HGB 13.7 14.9  HCT 42.2 45.3  PLT 219 236   BMET Recent Labs    11/25/17 0240 11/26/17 0353  NA 136 138  K 3.7 4.2  CL 104 102  CO2 25 25  GLUCOSE 120* 100*  BUN 9 13  CREATININE 0.79 0.90  CALCIUM 8.5* 9.0    Studies/Results: Ct Head Wo Contrast  Result Date: 11/24/2017 CLINICAL DATA:  Altered mental status. EXAM: CT HEAD WITHOUT CONTRAST TECHNIQUE: Contiguous axial images were obtained from the base of the skull through the vertex without intravenous contrast. COMPARISON:  None. FINDINGS: Brain: There is multifocal intraparenchymal hemorrhage. The larger focus is located in the right frontal lobe and measures 5.3 x 3.6 x 4.5 cm (volume = 45 cm^3). Focus in the left frontal lobe measures 1.8 cm. A more inferior right frontal focus measures 10 mm. There is anterior midline shift to the left measuring 6 mm at the level of the foramina of [place]. Brain parenchyma is otherwise normal. Vascular:  No abnormal hyperdensity of the major intracranial arteries or dural venous sinuses. No intracranial atherosclerosis. Skull: Old bilateral craniotomies. Sinuses/Orbits: No fluid levels or advanced mucosal thickening of the visualized paranasal sinuses. No mastoid or middle ear effusion. The orbits are normal. IMPRESSION: 1. Multifocal acute intraparenchymal hemorrhage, largest in the right frontal lobe, measuring 45 mL. The multifocality of emerges, while possibly due to hypertension, raise suspicion for metastatic disease. MRI of the brain with and without contrast is recommended, when possible, to assess for possible underlying lesions. 2. The appearance of the hematomata suggests that they are of mixed acuity with some components in the early subacute phase, which is compatible with the reported timeline of the patient's symptoms over days to weeks. 3. Leftward midline shift measuring 6 mm. Critical Value/emergent results were called by telephone at the time of interpretation on 11/24/2017 at 6:51 pm to Dr. Virgel Manifold , who verbally acknowledged these results. Electronically Signed   By: Ulyses Jarred M.D.   On: 11/24/2017 18:55   Mr Jeri Cos ZW Contrast  Result Date: 11/26/2017 CLINICAL DATA:  Altered mental status, follow up intracranial hemorrhage. On Coumadin for atrial fibrillation. History of subdural hematoma vacuo a shin. EXAM: MRI HEAD WITHOUT AND WITH CONTRAST TECHNIQUE: Multiplanar, multiecho pulse sequences of the brain and surrounding structures were obtained without and with intravenous contrast. CONTRAST:  10 cc Gadavist COMPARISON:  CT HEAD November 24, 2017 FINDINGS: INTRACRANIAL CONTENTS:  2 RIGHT and 1 LEFT frontal intraparenchymal hematomas measuring to 5.1 cm on the RIGHT, proportional surrounding vasogenic edema and regional mass effect. Hematomas demonstrate heterogeneous plate dark shading on FLAIR sequence and, mild intrinsic T1 shortening. Scant superimposed enhancement of the  hematomas. 5 mm RIGHT to LEFT subfalcine herniation. Mass effect on RIGHT frontal horn lateral ventricle without entrapment or hydrocephalus. No suspicious intracranial or extra-axial enhancement. No abnormal extra-axial fluid collections. Convexity susceptibility artifact associated with old subdural hematomas and burr holes. No reduced diffusion to suggest acute ischemia. VASCULAR: Normal major intracranial vascular flow voids present at skull base. SKULL AND UPPER CERVICAL SPINE: No abnormal sellar expansion. No suspicious calvarial bone marrow signal. LEFT craniotomy. Four convexity holes. Craniocervical junction maintained. SINUSES/ORBITS: Bilateral mastoid effusions and scattered mucosal retention cyst. Status post bilateral ocular lens implants.The included ocular globes and orbital contents are non-suspicious. OTHER: None. IMPRESSION: 1. Bifrontal early subacute hematomas measuring to 5.1 cm on the RIGHT. Proportional vasogenic edema, 5 mm RIGHT to LEFT subfalcine herniation. 2. Status post subdural hematoma evacuation without re-accumulation. Electronically Signed   By: Elon Alas M.D.   On: 11/26/2017 02:58    Assessment/Plan: Bilateral intracerebral hemorrhage: The patient is stable clinically.  He will need continued ICU observation.  So far so good.  LOS: 2 days     [name] [name] 11/26/2017, 7:59 AM
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
## 2017-11-27 DIAGNOSIS — I618 Other nontraumatic intracerebral hemorrhage: Secondary | ICD-10-CM

## 2017-11-27 LAB — GLUCOSE, CAPILLARY
GLUCOSE-CAPILLARY: 120 mg/dL — AB (ref 70–99)
Glucose-Capillary: 102 mg/dL — ABNORMAL HIGH (ref 70–99)
Glucose-Capillary: 95 mg/dL (ref 70–99)
Glucose-Capillary: 93 mg/dL (ref 70–99)

## 2017-11-27 LAB — CBC
HEMATOCRIT: 45.6 % (ref 39.0–52.0)
Hemoglobin: 15.5 g/dL (ref 13.0–17.0)
MCH: 29.9 pg (ref 26.0–34.0)
MCHC: 34 g/dL (ref 30.0–36.0)
MCV: 87.9 fL (ref 78.0–100.0)
Platelets: 193 10*3/uL (ref 150–400)
RBC: 5.19 MIL/uL (ref 4.22–5.81)
RDW: 13.3 % (ref 11.5–15.5)
WBC: 8.9 10*3/uL (ref 4.0–10.5)

## 2017-11-27 LAB — BASIC METABOLIC PANEL
ANION GAP: 10 (ref 5–15)
BUN: 13 mg/dL (ref 8–23)
CALCIUM: 8.8 mg/dL — AB (ref 8.9–10.3)
CO2: 24 mmol/L (ref 22–32)
Chloride: 102 mmol/L (ref 98–111)
Creatinine, Ser: 0.79 mg/dL (ref 0.61–1.24)
GFR calc Af Amer: >60 mL/min (ref >60)
GFR calc non Af Amer: >60 mL/min (ref >60)
GLUCOSE: 94 mg/dL (ref 70–99)
Potassium: 4.1 mmol/L (ref 3.5–5.1)
Sodium: 136 mmol/L (ref 135–145)

## 2017-11-27 MED ORDER — DILTIAZEM HCL ER COATED BEADS 360 MG PO CP24
360.0000 mg | ORAL_CAPSULE | Freq: Every evening | ORAL | Status: DC
  Administered 2017-11-27 – 2017-11-28 (×2): 360 mg via ORAL
  Filled 2017-11-27: qty 1
  Filled 2017-11-27: qty 2

## 2017-11-27 MED ORDER — ATORVASTATIN CALCIUM 10 MG PO TABS: 10.0000 mg | ORAL_TABLET | Freq: Every morning | ORAL | Status: DC

## 2017-11-27 MED ORDER — ALBUTEROL SULFATE (2.5 MG/3ML) 0.083% IN NEBU: 3.0000 mL | INHALATION_SOLUTION | Freq: Four times a day (QID) | RESPIRATORY_TRACT | Status: DC | PRN

## 2017-11-27 MED ORDER — LEVOTHYROXINE SODIUM 100 MCG PO TABS: 100.0000 ug | ORAL_TABLET | Freq: Every morning | ORAL | Status: DC

## 2017-11-27 NOTE — Evaluation (Signed)
Physical Therapy Evaluation Patient Details Name: [name] [name] MRN: [id number] DOB: 05/18/1940 Today's Date: 11/27/2017   History of Present Illness  77 year old man with a history of previous subdural hematoma, atrial fibrillation on Coumadin, admitted with another intracerebral hemorrhage after suffering head trauma  Clinical Impression  Patient presents close to functional baseline.  Did educate on fall prevention tips for home including footwear, lighting, set up in shower, keeping items frequently used close enough to reach. No further skilled PT needs.  Will sign off.     Follow Up Recommendations No PT follow up    Equipment Recommendations  None recommended by PT    Recommendations for Other Services       Precautions / Restrictions Precautions Precautions: None      Mobility  Bed Mobility               General bed mobility comments: up in recliner  Transfers Overall transfer level: Independent                  Ambulation/Gait Ambulation/Gait assistance: Independent Gait Distance (Feet): 200 Feet Assistive device: None Gait Pattern/deviations: WFL(Within Functional Limits)        Stairs Stairs: Yes Stairs assistance: Modified independent (Device/Increase time);Supervision Stair Management: One rail Left;Alternating pattern;Forwards Number of Stairs: 3 General stair comments: S turning on stairs for safety  Wheelchair Mobility    Modified Rankin (Stroke Patients Only)       Balance Overall balance assessment: Independent                               Standardized Balance Assessment Standardized Balance Assessment : Dynamic Gait Index   Dynamic Gait Index Level Surface: Normal Change in Gait Speed: Mild Impairment Gait with Horizontal Head Turns: Normal Gait with Vertical Head Turns: Normal Gait and Pivot Turn: Normal Step Over Obstacle: Normal Step Around Obstacles: Normal Steps: Mild Impairment Total Score:  22       Pertinent Vitals/Pain Pain Assessment: No/denies pain    Home Living Family/patient expects to be discharged to:: Private residence Living Arrangements: Spouse/significant other Available Help at Discharge: Family;Available 24 hours/day Type of Home: House Home Access: Ramped entrance     Home Layout: One level Home Equipment: Walker - 2 wheels;Cane - single point;Bedside commode;Shower seat      Prior Function Level of Independence: Independent         Comments: does the cooking and grocery shopping (wife here was transported via w/c)     Hand Dominance   Dominant Hand: Right    Extremity/Trunk Assessment   Upper Extremity Assessment Upper Extremity Assessment: Overall WFL for tasks assessed    Lower Extremity Assessment Lower Extremity Assessment: Overall WFL for tasks assessed       Communication   Communication: No difficulties  Cognition Arousal/Alertness: Awake/alert Behavior During Therapy: WFL for tasks assessed/performed Overall Cognitive Status: Within Functional Limits for tasks assessed                                        General Comments      Exercises     Assessment/Plan    PT Assessment Patent does not need any further PT services  PT Problem List         PT Treatment Interventions      PT Goals (Current goals  can be found in the Care Plan section)  Acute Rehab PT Goals PT Goal Formulation: All assessment and education complete, DC therapy    Frequency     Barriers to discharge        Co-evaluation               AM-PAC PT "6 Clicks" Daily Activity  Outcome Measure Difficulty turning over in bed (including adjusting bedclothes, sheets and blankets)?: None Difficulty moving from lying on back to sitting on the side of the bed? : None Difficulty sitting down on and standing up from a chair with arms (e.g., wheelchair, bedside commode, etc,.)?: None Help needed moving to and from a bed to  chair (including a wheelchair)?: None Help needed walking in hospital room?: None Help needed climbing 3-5 steps with a railing? : A Little 6 Click Score: 23    End of Session Equipment Utilized During Treatment: Gait belt Activity Tolerance: Patient tolerated treatment well Patient left: with call bell/phone within reach;in chair;with family/visitor present   PT Visit Diagnosis: Other abnormalities of gait and mobility (R26.89)    Time: 0569-7948 PT Time Calculation (min) (ACUTE ONLY): 21 min   Charges:   PT Evaluation $PT Eval Low Complexity: Jansen, PT Acute Rehabilitation Services [phone] 11/27/2017   Reginia Naas 11/27/2017, 3:22 PM

## 2017-11-27 NOTE — Progress Notes (Signed)
Subjective: The patient is alert and pleasant.  He looks well.  His family is at the bedside.  He has no complaints.  Objective: Vital signs in last 24 hours: Temp:  [97.6 F (36.4 C)-98.1 F (36.7 C)] 98 F (36.7 C) (10/02 1200) Pulse Rate:  [58-72] 58 (10/02 0500) Resp:  [10-21] 15 (10/02 1200) BP: (93-163)/(43-88) 143/63 (10/02 0700) SpO2:  [91 %-100 %] 100 % (10/02 0500) Estimated body mass index is 41.53 kg/m as calculated from the following:   Height as of this encounter: 5\' 10"  (1.778 m).   Weight as of this encounter: 131.3 kg.   Intake/Output from previous day: 10/01 0701 - 10/02 0700 In: 710 [P.O.:710] Out: 500 [Urine:500] Intake/Output this shift: Total I/O In: 240 [P.O.:240] Out: -   Physical exam patient is alert and pleasant.  Oriented x3.  His speech and strength is normal.  Lab Results: Recent Labs    11/26/17 0353 11/27/17 0301  WBC 8.0 8.9  HGB 14.9 15.5  HCT 45.3 45.6  PLT 236 193   BMET Recent Labs    11/26/17 0353 11/27/17 0727  NA 138 136  K 4.2 4.1  CL 102 102  CO2 25 24  GLUCOSE 100* 94  BUN 13 13  CREATININE 0.90 0.79  CALCIUM 9.0 8.8*    Studies/Results: Mr Jeri Cos Wo Contrast  Result Date: 11/26/2017 CLINICAL DATA:  Altered mental status, follow up intracranial hemorrhage. On Coumadin for atrial fibrillation. History of subdural hematoma vacuo a shin. EXAM: MRI HEAD WITHOUT AND WITH CONTRAST TECHNIQUE: Multiplanar, multiecho pulse sequences of the brain and surrounding structures were obtained without and with intravenous contrast. CONTRAST:  10 cc Gadavist COMPARISON:  CT HEAD November 24, 2017 FINDINGS: INTRACRANIAL CONTENTS: 2 RIGHT and 1 LEFT frontal intraparenchymal hematomas measuring to 5.1 cm on the RIGHT, proportional surrounding vasogenic edema and regional mass effect. Hematomas demonstrate heterogeneous plate dark shading on FLAIR sequence and, mild intrinsic T1 shortening. Scant superimposed enhancement of the  hematomas. 5 mm RIGHT to LEFT subfalcine herniation. Mass effect on RIGHT frontal horn lateral ventricle without entrapment or hydrocephalus. No suspicious intracranial or extra-axial enhancement. No abnormal extra-axial fluid collections. Convexity susceptibility artifact associated with old subdural hematomas and burr holes. No reduced diffusion to suggest acute ischemia. VASCULAR: Normal major intracranial vascular flow voids present at skull base. SKULL AND UPPER CERVICAL SPINE: No abnormal sellar expansion. No suspicious calvarial bone marrow signal. LEFT craniotomy. Four convexity holes. Craniocervical junction maintained. SINUSES/ORBITS: Bilateral mastoid effusions and scattered mucosal retention cyst. Status post bilateral ocular lens implants.The included ocular globes and orbital contents are non-suspicious. OTHER: None. IMPRESSION: 1. Bifrontal early subacute hematomas measuring to 5.1 cm on the RIGHT. Proportional vasogenic edema, 5 mm RIGHT to LEFT subfalcine herniation. 2. Status post subdural hematoma evacuation without re-accumulation. Electronically Signed   By: Elon Alas M.D.   On: 11/26/2017 02:58    Assessment/Plan: Intracerebral hemorrhage: The patient looks much better than his scan.  I would recommend we keep him here until 11/29/2017.  We will plan to repeat his CAT scan on Friday morning.  If this without change he can be discharged.  He can go to a stepdown unit from my point of view.  LOS: 3 days     Ophelia Charter 11/27/2017, 3:06 PM

## 2017-11-27 NOTE — Progress Notes (Addendum)
PROGRESS NOTE    [name] [name]  RCB:[id number] DOB: 1940/08/27 DOA: 11/24/2017 PCP: Dettinger, [name], MD   Brief Narrative: 77 yr old male with PMHx subdural hematoma s/p evacuation , Afib on Coumadin presented to the hospital with  altered mental status and was found to have [place]. Neurosurgery was consulted. Warfarin was reversed with Vitamin K and Kcentra. Pt transferred from [facility] and was accepted by PCCM.  Assessment & Plan:   Active Problems:   Intracerebral hemorrhage (HCC)   Intracranial bleeding (HCC)   Warfarin anticoagulation   H/O subdural hemorrhage   History of burr hole surgery  Multifocal acute intraparenchymal hemorrhage, the largest one on the right frontal lobe around 6 mm with mild left-shift from head CT scan on 11/24/2017.Marland Kitchen MRI done on 11/26/2017 showed bifrontal early subacute hematomas measuring to 5.1 cm on the RIGHT. Proportional vasogenic edema, 5 mm RIGHT to LEFT subfalcine herniation. Status post subdural hematoma evacuation without re-accumulation.  Patient was seen by neurosurgery today.  Recommend continuation of observation in the hospital until 11/29/2017.Marland Kitchen  Patient has remained stable.  Will downgrade to stepdown today.  Was seen by physical therapy today and recommend no skilled therapy needs.  Acute encephalopathy secondary to intracranial hemorrhage.  Improved.  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  History of chronic atrial fibrillation on Coumadin.  Discussed with the patient regarding the risk of being on Coumadin due to second episode of intracranial hemorrhage.  Patient will be discontinued off Coumadin for now.  Patient will have to have a discussion with his primary care physician as outpatient ,could consider low-dose aspirin as an option later date.  Risk versus benefits of anticoagulants including stroke and hemorrhage were explained to the patient and  the family in detail.  Continue the patient on flexion and.  Resume Cardizem.  Watch for blood pressure closely.  Morbid obesity.  Present on admission.  Patient was counseled about nutrition and weight loss.  Diabetes mellitus type 2.  On sliding scale insulin Accu-Cheks diabetic diet.  We will closely monitor.  Blood glucose levels seem to be within normal range.   DVT prophylaxis: SCD  Code Status: Full code  Family Communication: Spoke with the family members at bedside  Disposition Plan: Home  Consultants: Neurosurgery,  Procedures: CT scan of the head, MRI of the brain  Antimicrobials: None  Subjective:  Patient denies headache nausea vomiting blurring of vision or focal weakness.  Objective: Vitals:   11/27/17 0500 11/27/17 0600 11/27/17 0700 11/27/17 0800  BP: 131/88 (!) 142/75 (!) 143/63   Pulse: (!) 58     Resp: 13 12 11  (!) 21  Temp:    97.8 F (36.6 C)  TempSrc:    Oral  SpO2: 100%     Weight:      Height:        Intake/Output Summary (Last 24 hours) at 11/27/2017 1109 Last data filed at 11/27/2017 0000 Gross per 24 hour  Intake 350 ml  Output 500 ml  Net -150 ml   Filed Weights   11/24/17 1714 11/24/17 2205  Weight: 130.2 kg 131.3 kg    Examination: General exam: Appears calm and comfortable ,Not in obvious distress,average built HEENT:PERRL,Oral mucosa moist, Ear/Nose normal on gross exam Respiratory system: Bilateral equal air entry, normal vesicular breath sounds, no wheezes or crackles  Cardiovascular system: S1 & S2 heard, RRR. No JVD, murmurs, rubs, gallops or clicks. No  pedal edema. Gastrointestinal system: Abdomen is nondistended, soft and nontender. No organomegaly or masses felt. Normal bowel sounds heard. MSK: Normal muscle bulk,tone ,power Extremities: No edema, no clubbing ,no cyanosis, distal peripheral pulses palpable. Skin: No rashes, lesions or ulcers,no icterus ,no pallor Psychiatry: Judgement and insight appear normal. Mood &  affect appropriate.  Central nervous system: Alert and oriented. No focal neurological deficits.  Data Reviewed: I have personally reviewed following labs and imaging studies  CBC: Recent Labs  Lab 11/24/17 1735 11/25/17 0240 11/26/17 0353 11/27/17 0301  WBC 8.5 8.3 8.0 8.9  NEUTROABS 4.2 4.1  --   --   HGB 13.9 13.7 14.9 15.5  HCT 41.6 42.2 45.3 45.6  MCV 90.0 89.6 89.5 87.9  PLT 220 219 236 353   Basic Metabolic Panel: Recent Labs  Lab 11/24/17 1735 11/25/17 0240 11/26/17 0353 11/27/17 0727  NA 137 136 138 136  K 4.2 3.7 4.2 4.1  CL 103 104 102 102  CO2 27 25 25 24   GLUCOSE 86 120* 100* 94  BUN 11 9 13 13   CREATININE 0.76 0.79 0.90 0.79  CALCIUM 8.9 8.5* 9.0 8.8*  MG  --  2.1 2.1  --   PHOS  --  3.0  --   --    GFR: Estimated Creatinine Clearance: 110.3 mL/min (by C-G formula based on SCr of 0.79 mg/dL). Liver Function Tests: No results for input(s): AST, ALT, ALKPHOS, BILITOT, PROT, ALBUMIN in the last 168 hours. No results for input(s): LIPASE, AMYLASE in the last 168 hours. No results for input(s): AMMONIA in the last 168 hours. Coagulation Profile: Recent Labs  Lab 11/24/17 1744 11/24/17 2142 11/25/17 0240 11/25/17 1002 11/26/17 0353  INR 1.99 1.06 1.04 1.11 1.01   Cardiac Enzymes: No results for input(s): CKTOTAL, CKMB, CKMBINDEX, TROPONINI in the last 168 hours. BNP (last 3 results) No results for input(s): PROBNP in the last 8760 hours. HbA1C: Recent Labs    11/25/17 0240  HGBA1C 6.1*   CBG: Recent Labs  Lab 11/26/17 0747 11/26/17 1208 11/26/17 1555 11/26/17 2133 11/27/17 0756  GLUCAP 117* 93 107* 135* 102*   Lipid Profile: No results for input(s): CHOL, HDL, LDLCALC, TRIG, CHOLHDL, LDLDIRECT in the last 72 hours. Thyroid Function Tests: Recent Labs    11/25/17 0240  TSH 3.109   Anemia Panel: No results for input(s): VITAMINB12, FOLATE, FERRITIN, TIBC, IRON, RETICCTPCT in the last 72 hours. Sepsis Labs: No results for  input(s): PROCALCITON, LATICACIDVEN in the last 168 hours.  Recent Results (from the past 240 hour(s))  MRSA PCR Screening     Status: None   Collection Time: 11/24/17 10:46 PM  Result Value Ref Range Status   MRSA by PCR NEGATIVE NEGATIVE Final    Comment:        The GeneXpert MRSA Assay (FDA approved for NASAL specimens only), is one component of a comprehensive MRSA colonization surveillance program. It is not intended to diagnose MRSA infection nor to guide or monitor treatment for MRSA infections. Performed at [facility], [place] [address], [place], [place] [address]      Radiology Studies: Mr Jeri Cos XV Contrast  Result Date: 11/26/2017 CLINICAL DATA:  Altered mental status, follow up intracranial hemorrhage. On Coumadin for atrial fibrillation. History of subdural hematoma vacuo a shin. EXAM: MRI HEAD WITHOUT AND WITH CONTRAST TECHNIQUE: Multiplanar, multiecho pulse sequences of the brain and surrounding structures were obtained without and with intravenous contrast. CONTRAST:  10 cc Gadavist COMPARISON:  CT  HEAD November 24, 2017 FINDINGS: INTRACRANIAL CONTENTS: 2 RIGHT and 1 LEFT frontal intraparenchymal hematomas measuring to 5.1 cm on the RIGHT, proportional surrounding vasogenic edema and regional mass effect. Hematomas demonstrate heterogeneous plate dark shading on FLAIR sequence and, mild intrinsic T1 shortening. Scant superimposed enhancement of the hematomas. 5 mm RIGHT to LEFT subfalcine herniation. Mass effect on RIGHT frontal horn lateral ventricle without entrapment or hydrocephalus. No suspicious intracranial or extra-axial enhancement. No abnormal extra-axial fluid collections. Convexity susceptibility artifact associated with old subdural hematomas and burr holes. No reduced diffusion to suggest acute ischemia. VASCULAR: Normal major intracranial vascular flow voids present at skull base. SKULL AND UPPER CERVICAL SPINE: No abnormal sellar expansion. No suspicious  calvarial bone marrow signal. LEFT craniotomy. Four convexity holes. Craniocervical junction maintained. SINUSES/ORBITS: Bilateral mastoid effusions and scattered mucosal retention cyst. Status post bilateral ocular lens implants.The included ocular globes and orbital contents are non-suspicious. OTHER: None. IMPRESSION: 1. Bifrontal early subacute hematomas measuring to 5.1 cm on the RIGHT. Proportional vasogenic edema, 5 mm RIGHT to LEFT subfalcine herniation. 2. Status post subdural hematoma evacuation without re-accumulation. Electronically Signed   By: Elon Alas M.D.   On: 11/26/2017 02:58    Scheduled Meds: . atorvastatin  20 mg Oral q1800  . flecainide  100 mg Oral BID  . insulin aspart  0-15 Units Subcutaneous TID WC  . insulin aspart  0-5 Units Subcutaneous QHS  . levothyroxine  100 mcg Oral QAC breakfast  . metFORMIN  500 mg Oral Q breakfast   Continuous Infusions:   LOS: 3 days   Time spent: More than 50% of that time was spent in counseling and/or coordination of care.  Flora Lipps, MD [facility] Pager [phone]  If 7PM-7AM, please contact night-coverage [email or web address] Password [email or web address] 11/27/2017, 11:09 AM

## 2017-11-28 LAB — CBC
HEMATOCRIT: 44.2 % (ref 39.0–52.0)
Hemoglobin: 14.4 g/dL (ref 13.0–17.0)
MCH: 29.3 pg (ref 26.0–34.0)
MCHC: 32.6 g/dL (ref 30.0–36.0)
MCV: 89.8 fL (ref 78.0–100.0)
PLATELETS: 228 10*3/uL (ref 150–400)
RBC: 4.92 MIL/uL (ref 4.22–5.81)
RDW: 13.3 % (ref 11.5–15.5)
WBC: 9.9 10*3/uL (ref 4.0–10.5)

## 2017-11-28 LAB — GLUCOSE, CAPILLARY
GLUCOSE-CAPILLARY: 135 mg/dL — AB (ref 70–99)
Glucose-Capillary: 111 mg/dL — ABNORMAL HIGH (ref 70–99)
Glucose-Capillary: 91 mg/dL (ref 70–99)
Glucose-Capillary: 98 mg/dL (ref 70–99)

## 2017-11-28 NOTE — Plan of Care (Signed)
Pt able to state any needs he may have and is able to perform ADLs well on his own after given supplies. Pt steady ambulating and moving well from bed to chair to bathroom.

## 2017-11-28 NOTE — Progress Notes (Signed)
Subjective: The patient is alert and pleasant.  He has no complaints.  He looks well.  Objective: Vital signs in last 24 hours: Temp:  [97.6 F (36.4 C)-98.2 F (36.8 C)] 97.8 F (36.6 C) (10/03 0800) Pulse Rate:  [75] 75 (10/03 0800) Resp:  [9-24] 9 (10/03 0900) BP: (121-151)/(64-99) 145/99 (10/03 0900) SpO2:  [98 %-100 %] 100 % (10/03 0800) Estimated body mass index is 41.53 kg/m as calculated from the following:   Height as of this encounter: 5\' 10"  (1.778 m).   Weight as of this encounter: 131.3 kg.   Intake/Output from previous day: 10/02 0701 - 10/03 0700 In: 960 [P.O.:960] Out: 1050 [Urine:1050] Intake/Output this shift: No intake/output data recorded.  Physical exam the patient is alert and pleasant.  He is oriented x3.  His speech and strength is normal.  Lab Results: Recent Labs    11/27/17 0301 11/28/17 0238  WBC 8.9 9.9  HGB 15.5 14.4  HCT 45.6 44.2  PLT 193 228   BMET Recent Labs    11/26/17 0353 11/27/17 0727  NA 138 136  K 4.2 4.1  CL 102 102  CO2 25 24  GLUCOSE 100* 94  BUN 13 13  CREATININE 0.90 0.79  CALCIUM 9.0 8.8*    Studies/Results: No results found.  Assessment/Plan: Intracerebral hemorrhages: The patient continues to do well clinically.  We will plan to repeat his CAT scan tomorrow.  If it is without significant change then he can be discharged home from my point of view.  LOS: 4 days     Ophelia Charter 11/28/2017, 11:31 AM

## 2017-11-28 NOTE — Progress Notes (Signed)
PROGRESS NOTE    [name] [name]  CBJ:[id number] DOB: 02/26/1941 DOA: 11/24/2017 PCP: Dettinger, [name], MD   Brief Narrative: 77 yr old male with PMHx subdural hematoma s/p evacuation , Afib on Coumadin presented to the hospital with  altered mental status and was found to have [place]. Neurosurgery was consulted. Warfarin was reversed with Vitamin K and Kcentra. Pt transferred from [facility] and was accepted by PCCM.  Assessment & Plan:   Active Problems:   Intracerebral hemorrhage (HCC)   Intracranial bleeding (HCC)   Warfarin anticoagulation   H/O subdural hemorrhage   History of burr hole surgery  Multifocal acute intraparenchymal hemorrhage, the largest one on the right frontal lobe around 6 mm with mild left-shift from head CT scan on 11/24/2017. MRI done on 11/26/2017 showed bifrontal early subacute hematomas measuring to 5.1 cm on the RIGHT. Proportional vasogenic edema, 5 mm RIGHT to LEFT subfalcine herniation.  Neurosurgery on board, recommend continuation of observation in the hospital until 11/29/2017 with a CT scan on 11/29/2017.  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  History of chronic atrial fibrillation on Coumadin.  Discussed with the patient regarding the risk of being on Coumadin due to second episode of intracranial hemorrhage.  Patient will be discontinued off Coumadin for now.  Patient will have to have a discussion with his primary [name] physician as outpatient ,could consider low-dose aspirin as an option later date.  Risk versus benefits of anticoagulants including stroke and hemorrhage were explained to the patient and the family in detail.  on Cardizem.    Morbid obesity.  Present on admission.  Patient was counseled about nutrition and weight loss.  Diabetes mellitus type 2.  On sliding scale insulin, Accu-Cheks  diabetic diet.  We will closely monitor.  Blood glucose levels seem to be within normal range.   DVT prophylaxis: SCD  Code Status: Full code  Family Communication:  No one at bedside  Disposition Plan: Home  Consultants: Neurosurgery,  Procedures: CT scan of the head, MRI of the brain  Antimicrobials: None  Subjective:  Denies any headache, nausea vomiting or increasing weakness.  Objective: Vitals:   11/28/17 0600 11/28/17 0700 11/28/17 0800 11/28/17 0900  BP: 140/85 (!) 145/64 140/74 (!) 145/99  Pulse:   75   Resp: 11 11  (!) 9  Temp:   97.8 F (36.6 C)   TempSrc:   Oral   SpO2:   100%   Weight:      Height:        Intake/Output Summary (Last 24 hours) at 11/28/2017 1106 Last data filed at 11/28/2017 0400 Gross per 24 hour  Intake 840 ml  Output 1050 ml  Net -210 ml   Filed Weights   11/24/17 1714 11/24/17 2205  Weight: 130.2 kg 131.3 kg    Examination: General exam: Appears calm and comfortable ,Not in distress,average built HEENT:PERRL,Oral mucosa moist, Ear/Nose normal on gross exam Respiratory system: Bilateral equal air entry, normal vesicular breath sounds, no wheezes or crackles  Cardiovascular system: S1 & S2 heard, irregular rhythm. No JVD, murmurs, rubs, gallops or clicks. No pedal edema. Gastrointestinal system: Abdomen is nondistended, soft and nontender. No organomegaly or masses felt. Normal bowel sounds heard. Central nervous system: Alert and oriented. No focal neurological deficits. Extremities: No edema, no clubbing ,no cyanosis, distal peripheral pulses palpable. Skin: No rashes,  lesions or ulcers,no icterus ,no pallor MSK: Normal muscle bulk,tone ,power Psychiatry: Judgement and insight appear normal. Mood & affect appropriate.    Data Reviewed: I have personally reviewed following labs and imaging studies  CBC: Recent Labs  Lab 11/24/17 1735 11/25/17 0240 11/26/17 0353 11/27/17 0301 11/28/17 0238  WBC 8.5 8.3 8.0 8.9 9.9    NEUTROABS 4.2 4.1  --   --   --   HGB 13.9 13.7 14.9 15.5 14.4  HCT 41.6 42.2 45.3 45.6 44.2  MCV 90.0 89.6 89.5 87.9 89.8  PLT 220 219 236 193 163   Basic Metabolic Panel: Recent Labs  Lab 11/24/17 1735 11/25/17 0240 11/26/17 0353 11/27/17 0727  NA 137 136 138 136  K 4.2 3.7 4.2 4.1  CL 103 104 102 102  CO2 27 25 25 24   GLUCOSE 86 120* 100* 94  BUN 11 9 13 13   CREATININE 0.76 0.79 0.90 0.79  CALCIUM 8.9 8.5* 9.0 8.8*  MG  --  2.1 2.1  --   PHOS  --  3.0  --   --    GFR: Estimated Creatinine Clearance: 110.3 mL/min (by C-G formula based on SCr of 0.79 mg/dL). Liver Function Tests: No results for input(s): AST, ALT, ALKPHOS, BILITOT, PROT, ALBUMIN in the last 168 hours. No results for input(s): LIPASE, AMYLASE in the last 168 hours. No results for input(s): AMMONIA in the last 168 hours. Coagulation Profile: Recent Labs  Lab 11/24/17 1744 11/24/17 2142 11/25/17 0240 11/25/17 1002 11/26/17 0353  INR 1.99 1.06 1.04 1.11 1.01   Cardiac Enzymes: No results for input(s): CKTOTAL, CKMB, CKMBINDEX, TROPONINI in the last 168 hours. BNP (last 3 results) No results for input(s): PROBNP in the last 8760 hours. HbA1C: No results for input(s): HGBA1C in the last 72 hours. CBG: Recent Labs  Lab 11/27/17 0756 11/27/17 1216 11/27/17 1519 11/27/17 2115 11/28/17 0829  GLUCAP 102* 95 93 120* 111*   Lipid Profile: No results for input(s): CHOL, HDL, LDLCALC, TRIG, CHOLHDL, LDLDIRECT in the last 72 hours. Thyroid Function Tests: No results for input(s): TSH, T4TOTAL, FREET4, T3FREE, THYROIDAB in the last 72 hours. Anemia Panel: No results for input(s): VITAMINB12, FOLATE, FERRITIN, TIBC, IRON, RETICCTPCT in the last 72 hours. Sepsis Labs: No results for input(s): PROCALCITON, LATICACIDVEN in the last 168 hours.  Recent Results (from the past 240 hour(s))  MRSA PCR Screening     Status: None   Collection Time: 11/24/17 10:46 PM  Result Value Ref Range Status   MRSA by  PCR NEGATIVE NEGATIVE Final    Comment:        The GeneXpert MRSA Assay (FDA approved for NASAL specimens only), is one component of a comprehensive MRSA colonization surveillance program. It is not intended to diagnose MRSA infection nor to guide or monitor treatment for MRSA infections. Performed at [facility], [place] [address], [place], [place] [address]      Radiology Studies: No results found.  Scheduled Meds: . atorvastatin  20 mg Oral q1800  . diltiazem  360 mg Oral QPM  . flecainide  100 mg Oral BID  . insulin aspart  0-15 Units Subcutaneous TID WC  . insulin aspart  0-5 Units Subcutaneous QHS  . levothyroxine  100 mcg Oral QAC breakfast  . metFORMIN  500 mg Oral Q breakfast   Continuous Infusions:   LOS: 4 days   Time spent: More than 50% of that time was spent in counseling and/or coordination of [name].  [name] [name]  Khaliq Turay, MD [facility] Pager [phone]  If 7PM-7AM, please contact night-coverage [email or web address] Password [email or web address] 11/28/2017, 11:06 AM

## 2017-11-29 ENCOUNTER — Ambulatory Visit: Payer: PPO | Admitting: Family Medicine

## 2017-11-29 ENCOUNTER — Inpatient Hospital Stay (HOSPITAL_COMMUNITY): Payer: PPO

## 2017-11-29 LAB — GLUCOSE, CAPILLARY
Glucose-Capillary: 108 mg/dL — ABNORMAL HIGH (ref 70–99)
Glucose-Capillary: 151 mg/dL — ABNORMAL HIGH (ref 70–99)

## 2017-11-29 NOTE — Discharge Summary (Addendum)
Physician Discharge Summary  [name] [name] WCB:[id number] DOB: 01/03/1941 DOA: 11/24/2017  PCP: Dettinger, [name], MD  Admit date: 11/24/2017 Discharge date: 11/29/2017  Admitted From: Home  Disposition:  Home  Discharge Condition:Stable  CODE STATUS:FULL,   Diet recommendation: Heart Healthy   Brief/Interim Summary:  77 yr old male with PMHx subdural hematoma s/p evacuation , Afib on Coumadin presented to the hospital with  altered mental status and was found to have [place]. Neurosurgery was consulted. Warfarin was reversed with Vitamin K and Kcentra. Pt was transferred from [facility] and was accepted by PCCM.    Patient was admitted to the hospital and following conditions were addressed during hospitalization,  Multifocal acute intraparenchymal hemorrhage, the largest one on the right frontal lobe around 6 mm with mild left-shift from head CT scan on 11/24/2017. MRI done on 11/26/2017 showed bifrontal early subacute hematomas measuring to 5.1 cm on the RIGHT. Proportional vasogenic edema, 5 mm RIGHT to LEFT subfalcine herniation.  Neurosurgery was consulted and followed the patient in the hospital.  Repeat CT scan of the head was performed which showed stability.  Patient did not have any focal neurological deficits.  Patient was also seen by physical therapy who did not recommend any skilled therapy needs.  Patient was then cleared by neurosurgery for discharge home.  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  History of chronic atrial fibrillation on Coumadin on presentation..    I have discussed with the patient regarding the risk of being on Coumadin due to second episode of intracranial hemorrhage.  Patient will be discontinued off Coumadin for now.  Patient will have to have a discussion with his primary care physician/cardiology as outpatient ,could consider low-dose aspirin as an option later date.   Risk versus benefits of anticoagulants including stroke and hemorrhage were explained to the patient and the family in detail.    Will continue on Cardizem for atrial fibrillation.  Diabetes mellitus type 2.    Patient received  sliding scale insulin,  and will resume metformin on discharge.  Disposition.  Patient was seen by physical therapy and patient did not require any skilled therapy needs.  I also spoke with multiple family members at bedside regarding the plan for disposition and follow-up.  Discharge Diagnoses:    Intracerebral hemorrhage ([place])   Intracranial bleeding (HCC)   Warfarin anticoagulation   H/O subdural hemorrhage   History of burr hole surgery  Discharge Instructions  Discharge Instructions    Diet - low sodium heart healthy   Complete by:  As directed    Discharge instructions   Complete by:  As directed    Follow up with your primary care provider in one week. Follow up with Dr Arnoldo Morale, neurosurgery in 1-2 weeks. No aspirin or blood thinners (coumadin) until seen by your doctor. Follow up with Dr Stanford Breed cardiology in 1-2 weeks.   Increase activity slowly   Complete by:  As directed      Allergies as of 11/29/2017   No Known Allergies     Medication List    STOP taking these medications   warfarin 5 MG tablet Commonly known as:  COUMADIN     TAKE these medications   albuterol 108 (90 Base) MCG/ACT inhaler Commonly known as:  PROVENTIL HFA;VENTOLIN HFA Inhale 2 puffs into the lungs every 6 (six) hours as needed for wheezing or shortness of breath.   atorvastatin 10 MG tablet  Commonly known as:  LIPITOR TAKE 1 TABLET (10 MG TOTAL) BY MOUTH DAILY. What changed:  when to take this   diltiazem 360 MG 24 hr capsule Commonly known as:  CARDIZEM CD TAKE 1 CAPSULE (360 MG TOTAL) BY MOUTH DAILY. What changed:    how much to take  how to take this  when to take this  additional instructions   flecainide 100 MG tablet Commonly known as:   TAMBOCOR TAKE 1 TABLET (100 MG TOTAL) BY MOUTH 2 (TWO) TIMES DAILY. What changed:  See the new instructions.   levothyroxine 50 MCG tablet Commonly known as:  SYNTHROID, LEVOTHROID TAKE 2 TABLETS BY MOUTH DAILY What changed:  when to take this   lisinopril 20 MG tablet Commonly known as:  PRINIVIL,ZESTRIL TAKE 1 TABLET (20 MG TOTAL) BY MOUTH DAILY. What changed:  when to take this   metFORMIN 500 MG 24 hr tablet Commonly known as:  GLUCOPHAGE-XR TAKE 1 TABLET (500 MG TOTAL) BY MOUTH DAILY WITH BREAKFAST.       No Known Allergies  Consultations:  Neurosurgery   Procedures/Studies: Ct Head Wo Contrast  Result Date: 11/29/2017 CLINICAL DATA:  77 y/o  M; intracranial hemorrhage for follow-up. EXAM: CT HEAD WITHOUT CONTRAST TECHNIQUE: Contiguous axial images were obtained from the base of the skull through the vertex without intravenous contrast. COMPARISON:  11/24/2017 CT head. 11/26/2017 MRI head. FINDINGS: Brain: Stable size of 3 frontal lobe hematomas the largest in the right frontal lobe measuring 5.1 x 3.8 x 4.7 cm (volume = 48 cm^3) in the axial plane (series 3, image 21). The hematomas demonstrating decreased attenuation compatible with interval partial dispersion of blood products. Vasogenic edema associated with the hematomas is slightly increased as is mass effect with 10 mm right-to-left subfalcine herniation (series 3, image 17). No new acute intracranial hemorrhage, focus of mass effect, or stroke identified. Vascular: Calcific atherosclerosis of carotid siphons. No hyperdense vessel identified. Skull: Chronic postsurgical changes related to a left lateral craniotomy as well as bilateral frontal and parietal burr holes. Sinuses/Orbits: Patchy opacification of mastoid air cells. Maxillary sinus mucous retention cyst. Bilateral intra-ocular lens replacement. Other: None. IMPRESSION: 1. Stable size of 3 frontal lobe hematomas the largest in the right frontal lobe. Partial  interval dispersion of blood products. Surrounding vasogenic edema is mildly increased as is mass effect with 10 mm right-to-left subfalcine herniation. 2. No new acute intracranial abnormality identified. Electronically Signed   By: Kristine Garbe M.D.   On: 11/29/2017 06:08   Ct Head Wo Contrast  Result Date: 11/24/2017 CLINICAL DATA:  Altered mental status. EXAM: CT HEAD WITHOUT CONTRAST TECHNIQUE: Contiguous axial images were obtained from the base of the skull through the vertex without intravenous contrast. COMPARISON:  None. FINDINGS: Brain: There is multifocal intraparenchymal hemorrhage. The larger focus is located in the right frontal lobe and measures 5.3 x 3.6 x 4.5 cm (volume = 45 cm^3). Focus in the left frontal lobe measures 1.8 cm. A more inferior right frontal focus measures 10 mm. There is anterior midline shift to the left measuring 6 mm at the level of the foramina of [place]. Brain parenchyma is otherwise normal. Vascular: No abnormal hyperdensity of the major intracranial arteries or dural venous sinuses. No intracranial atherosclerosis. Skull: Old bilateral craniotomies. Sinuses/Orbits: No fluid levels or advanced mucosal thickening of the visualized paranasal sinuses. No mastoid or middle ear effusion. The orbits are normal. IMPRESSION: 1. Multifocal acute intraparenchymal hemorrhage, largest in the right frontal lobe, measuring 45 mL.  The multifocality of emerges, while possibly due to hypertension, raise suspicion for metastatic disease. MRI of the brain with and without contrast is recommended, when possible, to assess for possible underlying lesions. 2. The appearance of the hematomata suggests that they are of mixed acuity with some components in the early subacute phase, which is compatible with the reported timeline of the patient's symptoms over days to weeks. 3. Leftward midline shift measuring 6 mm. Critical Value/emergent results were called by telephone at the time of  interpretation on 11/24/2017 at 6:51 pm to Dr. Virgel Manifold , who verbally acknowledged these results. Electronically Signed   By: Ulyses Jarred M.D.   On: 11/24/2017 18:55   Mr Jeri Cos WN Contrast  Result Date: 11/26/2017 CLINICAL DATA:  Altered mental status, follow up intracranial hemorrhage. On Coumadin for atrial fibrillation. History of subdural hematoma vacuo a shin. EXAM: MRI HEAD WITHOUT AND WITH CONTRAST TECHNIQUE: Multiplanar, multiecho pulse sequences of the brain and surrounding structures were obtained without and with intravenous contrast. CONTRAST:  10 cc Gadavist COMPARISON:  CT HEAD November 24, 2017 FINDINGS: INTRACRANIAL CONTENTS: 2 RIGHT and 1 LEFT frontal intraparenchymal hematomas measuring to 5.1 cm on the RIGHT, proportional surrounding vasogenic edema and regional mass effect. Hematomas demonstrate heterogeneous plate dark shading on FLAIR sequence and, mild intrinsic T1 shortening. Scant superimposed enhancement of the hematomas. 5 mm RIGHT to LEFT subfalcine herniation. Mass effect on RIGHT frontal horn lateral ventricle without entrapment or hydrocephalus. No suspicious intracranial or extra-axial enhancement. No abnormal extra-axial fluid collections. Convexity susceptibility artifact associated with old subdural hematomas and burr holes. No reduced diffusion to suggest acute ischemia. VASCULAR: Normal major intracranial vascular flow voids present at skull base. SKULL AND UPPER CERVICAL SPINE: No abnormal sellar expansion. No suspicious calvarial bone marrow signal. LEFT craniotomy. Four convexity holes. Craniocervical junction maintained. SINUSES/ORBITS: Bilateral mastoid effusions and scattered mucosal retention cyst. Status post bilateral ocular lens implants.The included ocular globes and orbital contents are non-suspicious. OTHER: None. IMPRESSION: 1. Bifrontal early subacute hematomas measuring to 5.1 cm on the RIGHT. Proportional vasogenic edema, 5 mm RIGHT to LEFT  subfalcine herniation. 2. Status post subdural hematoma evacuation without re-accumulation. Electronically Signed   By: Elon Alas M.D.   On: 11/26/2017 02:58      Subjective: Patient denies any headache, nausea vomiting dizziness  Discharge Exam: Vitals:   11/29/17 0400 11/29/17 0825  BP: (!) 155/80 134/62  Pulse:  (!) 51  Resp:    Temp: (!) 97.5 F (36.4 C) 97.7 F (36.5 C)  SpO2: 100% 94%   Vitals:   11/28/17 2000 11/28/17 2300 11/29/17 0400 11/29/17 0825  BP: (!) 147/85 (!) 146/97 (!) 155/80 134/62  Pulse:  62  (!) 51  Resp:      Temp: 97.9 F (36.6 C) 97.8 F (36.6 C) (!) 97.5 F (36.4 C) 97.7 F (36.5 C)  TempSrc: Oral Oral Oral Oral  SpO2: 96% 100% 100% 94%  Weight:      Height:       General: Pt is alert, awake, not in acute distress Cardiovascular: Irregular rhythm S1/S2 +, no rubs, no gallops Respiratory: CTA bilaterally, no wheezing, no rhonchi Abdominal: Soft, NT, ND, bowel sounds + CNS: Nonfocal. Extremities: no edema, no cyanosis   The results of significant diagnostics from this hospitalization (including imaging, microbiology, ancillary and laboratory) are listed below for reference.     Microbiology: Recent Results (from the past 240 hour(s))  MRSA PCR Screening     Status: None  Collection Time: 11/24/17 10:46 PM  Result Value Ref Range Status   MRSA by PCR NEGATIVE NEGATIVE Final    Comment:        The GeneXpert MRSA Assay (FDA approved for NASAL specimens only), is one component of a comprehensive MRSA colonization surveillance program. It is not intended to diagnose MRSA infection nor to guide or monitor treatment for MRSA infections. Performed at [facility], [place] [address], [place], [place] [address]      Labs: BNP (last 3 results) No results for input(s): BNP in the last 8760 hours. Basic Metabolic Panel: Recent Labs  Lab 11/24/17 1735 11/25/17 0240 11/26/17 0353 11/27/17 0727  NA 137 136 138 136  K 4.2  3.7 4.2 4.1  CL 103 104 102 102  CO2 27 25 25 24   GLUCOSE 86 120* 100* 94  BUN 11 9 13 13   CREATININE 0.76 0.79 0.90 0.79  CALCIUM 8.9 8.5* 9.0 8.8*  MG  --  2.1 2.1  --   PHOS  --  3.0  --   --    Liver Function Tests: No results for input(s): AST, ALT, ALKPHOS, BILITOT, PROT, ALBUMIN in the last 168 hours. No results for input(s): LIPASE, AMYLASE in the last 168 hours. No results for input(s): AMMONIA in the last 168 hours. CBC: Recent Labs  Lab 11/24/17 1735 11/25/17 0240 11/26/17 0353 11/27/17 0301 11/28/17 0238  WBC 8.5 8.3 8.0 8.9 9.9  NEUTROABS 4.2 4.1  --   --   --   HGB 13.9 13.7 14.9 15.5 14.4  HCT 41.6 42.2 45.3 45.6 44.2  MCV 90.0 89.6 89.5 87.9 89.8  PLT 220 219 236 193 228   Cardiac Enzymes: No results for input(s): CKTOTAL, CKMB, CKMBINDEX, TROPONINI in the last 168 hours. BNP: Invalid input(s): POCBNP CBG: Recent Labs  Lab 11/27/17 2115 11/28/17 0829 11/28/17 1215 11/28/17 1658 11/28/17 2140  GLUCAP 120* 111* 135* 91 98   D-Dimer No results for input(s): DDIMER in the last 72 hours. Hgb A1c No results for input(s): HGBA1C in the last 72 hours. Lipid Profile No results for input(s): CHOL, HDL, LDLCALC, TRIG, CHOLHDL, LDLDIRECT in the last 72 hours. Thyroid function studies No results for input(s): TSH, T4TOTAL, T3FREE, THYROIDAB in the last 72 hours.  Invalid input(s): FREET3 Anemia work up No results for input(s): VITAMINB12, FOLATE, FERRITIN, TIBC, IRON, RETICCTPCT in the last 72 hours. Urinalysis    Component Value Date/Time   COLORURINE YELLOW 11/25/2017 0614   APPEARANCEUR CLEAR 11/25/2017 0614   LABSPEC 1.011 11/25/2017 0614   PHURINE 6.0 11/25/2017 0614   GLUCOSEU NEGATIVE 11/25/2017 0614   HGBUR NEGATIVE 11/25/2017 0614   BILIRUBINUR NEGATIVE 11/25/2017 0614   KETONESUR NEGATIVE 11/25/2017 0614   PROTEINUR NEGATIVE 11/25/2017 0614   UROBILINOGEN 1.0 05/11/2010 1145   NITRITE NEGATIVE 11/25/2017 0614   LEUKOCYTESUR NEGATIVE  11/25/2017 6270   Sepsis Labs Invalid input(s): PROCALCITONIN,  WBC,  LACTICIDVEN Microbiology Recent Results (from the past 240 hour(s))  MRSA PCR Screening     Status: None   Collection Time: 11/24/17 10:46 PM  Result Value Ref Range Status   MRSA by PCR NEGATIVE NEGATIVE Final    Comment:        The GeneXpert MRSA Assay (FDA approved for NASAL specimens only), is one component of a comprehensive MRSA colonization surveillance program. It is not intended to diagnose MRSA infection nor to guide or monitor treatment for MRSA infections. Performed at [facility], [place]  [address], [place], [place] [address]     Please note: You were cared for by a hospitalist during your hospital stay. Once you are discharged, your primary care physician will handle any further medical issues. Please note that NO REFILLS for any discharge medications will be authorized once you are discharged, as it is imperative that you return to your primary care physician (or establish a relationship with a primary care physician if you do not have one) for your post hospital discharge needs so that they can reassess your need for medications and monitor your lab values.   Time coordinating discharge: 40 minutes  SIGNED:  Flora Lipps, MD  [facility] 11/29/2017, 9:41 AM  If 7PM-7AM, please contact night-coverage [email or web address] Password [email or web address]

## 2017-11-29 NOTE — Progress Notes (Signed)
Subjective: The patient is alert and pleasant and in no apparent distress.  He wants to go home.  Objective: Vital signs in last 24 hours: Temp:  [97.5 F (36.4 C)-98 F (36.7 C)] 97.5 F (36.4 C) (10/04 0400) Pulse Rate:  [57-95] 62 (10/03 2300) Resp:  [9] 9 (10/03 0900) BP: (140-155)/(68-99) 155/80 (10/04 0400) SpO2:  [96 %-100 %] 100 % (10/04 0400) Weight:  [540 kg] 128 kg (10/03 1627) Estimated body mass index is 40.49 kg/m as calculated from the following:   Height as of this encounter: 5\' 10"  (1.778 m).   Weight as of this encounter: 128 kg.   Intake/Output from previous day: No intake/output data recorded. Intake/Output this shift: No intake/output data recorded.  Physical exam the patient is alert and oriented x3.  Glasgow Coma Scale 15.  His speech and strength is normal.  I reviewed the patient's follow-up head CT performed at [facility] today.  It demonstrates expected evolution of his hematoma with some mildly increased surrounding edema.  There has not been a whole lot of change.  Lab Results: Recent Labs    11/27/17 0301 11/28/17 0238  WBC 8.9 9.9  HGB 15.5 14.4  HCT 45.6 44.2  PLT 193 228   BMET Recent Labs    11/27/17 0727  NA 136  K 4.1  CL 102  CO2 24  GLUCOSE 94  BUN 13  CREATININE 0.79  CALCIUM 8.8*    Studies/Results: Ct Head Wo Contrast  Result Date: 11/29/2017 CLINICAL DATA:  77 y/o  M; intracranial hemorrhage for follow-up. EXAM: CT HEAD WITHOUT CONTRAST TECHNIQUE: Contiguous axial images were obtained from the base of the skull through the vertex without intravenous contrast. COMPARISON:  11/24/2017 CT head. 11/26/2017 MRI head. FINDINGS: Brain: Stable size of 3 frontal lobe hematomas the largest in the right frontal lobe measuring 5.1 x 3.8 x 4.7 cm (volume = 48 cm^3) in the axial plane (series 3, image 21). The hematomas demonstrating decreased attenuation compatible with interval partial dispersion of blood products.  Vasogenic edema associated with the hematomas is slightly increased as is mass effect with 10 mm right-to-left subfalcine herniation (series 3, image 17). No new acute intracranial hemorrhage, focus of mass effect, or stroke identified. Vascular: Calcific atherosclerosis of carotid siphons. No hyperdense vessel identified. Skull: Chronic postsurgical changes related to a left lateral craniotomy as well as bilateral frontal and parietal burr holes. Sinuses/Orbits: Patchy opacification of mastoid air cells. Maxillary sinus mucous retention cyst. Bilateral intra-ocular lens replacement. Other: None. IMPRESSION: 1. Stable size of 3 frontal lobe hematomas the largest in the right frontal lobe. Partial interval dispersion of blood products. Surrounding vasogenic edema is mildly increased as is mass effect with 10 mm right-to-left subfalcine herniation. 2. No new acute intracranial abnormality identified. Electronically Signed   By: Kristine Garbe M.D.   On: 11/29/2017 06:08    Assessment/Plan: Bifrontal intracerebral hemorrhages: The patient's series of imaging studies are stable.  He is doing well clinically.  I think he is okay to go home from my point of view.  I have asked him to follow-up with me in the office in a week or 2.  With 2 intracranial hemorrhage is on Coumadin perhaps the risk of anticoagulation outweigh the benefits.  LOS: 5 days     Ophelia Charter 11/29/2017, 7:03 AM

## 2017-11-29 NOTE — Consult Note (Signed)
            [facility] CM Primary Care Navigator  11/29/2017  [name] [name] 1940/12/27 [id number]   Went to seepatient at the bedside to identify possible discharge needs buthe wasalready discharged homeper staff.  Per MD note, patient presented to the hospital with altered mental status and was found to have [place] (Intracerebral hemorrhage). Neurosurgery consulted and was then cleared for discharge.  Patient has discharge instruction to follow-up withprimary care provider in 1 week, cardiology in 1- 2 weeks and neurosurgery follow-up in 1- 2 weeks.  Primary care provider's office is listed as providing transition of care (TOC) follow-up.   For additional questions please contact:  Edwena Felty A. Maureen Duesing, BSN, RN-BC [facility] PRIMARY CARE Navigator Cell: [phone]

## 2017-12-02 ENCOUNTER — Ambulatory Visit (INDEPENDENT_AMBULATORY_CARE_PROVIDER_SITE_OTHER): Payer: PPO | Admitting: Cardiology

## 2017-12-02 ENCOUNTER — Encounter: Payer: Self-pay | Admitting: Cardiology

## 2017-12-02 ENCOUNTER — Emergency Department (HOSPITAL_COMMUNITY)
Admission: EM | Admit: 2017-12-02 | Discharge: 2017-12-02 | Disposition: A | Discharge status: Home / Self Care | Payer: PPO | Attending: Emergency Medicine | Admitting: Emergency Medicine

## 2017-12-02 ENCOUNTER — Other Ambulatory Visit: Payer: Self-pay

## 2017-12-02 ENCOUNTER — Encounter (HOSPITAL_COMMUNITY): Payer: Self-pay

## 2017-12-02 ENCOUNTER — Emergency Department (HOSPITAL_COMMUNITY): Payer: PPO

## 2017-12-02 VITALS — BP 106/50 | HR 59 | Ht 69.0 in | Wt 290.0 lb

## 2017-12-02 DIAGNOSIS — I10 Essential (primary) hypertension: Secondary | ICD-10-CM

## 2017-12-02 DIAGNOSIS — R410 Disorientation, unspecified: Secondary | ICD-10-CM | POA: Diagnosis not present

## 2017-12-02 DIAGNOSIS — E7800 Pure hypercholesterolemia, unspecified: Secondary | ICD-10-CM | POA: Diagnosis not present

## 2017-12-02 DIAGNOSIS — E039 Hypothyroidism, unspecified: Secondary | ICD-10-CM | POA: Insufficient documentation

## 2017-12-02 DIAGNOSIS — R4182 Altered mental status, unspecified: Secondary | ICD-10-CM | POA: Diagnosis not present

## 2017-12-02 DIAGNOSIS — Z79899 Other long term (current) drug therapy: Secondary | ICD-10-CM | POA: Diagnosis not present

## 2017-12-02 DIAGNOSIS — Z8679 Personal history of other diseases of the circulatory system: Secondary | ICD-10-CM

## 2017-12-02 DIAGNOSIS — I480 Paroxysmal atrial fibrillation: Secondary | ICD-10-CM

## 2017-12-02 LAB — COMPREHENSIVE METABOLIC PANEL
ALBUMIN: 3.8 g/dL (ref 3.5–5.0)
ALK PHOS: 56 U/L (ref 38–126)
ALT: 25 U/L (ref 0–44)
AST: 20 U/L (ref 15–41)
Anion gap: 10 (ref 5–15)
BUN: 13 mg/dL (ref 8–23)
CALCIUM: 9 mg/dL (ref 8.9–10.3)
CO2: 22 mmol/L (ref 22–32)
Chloride: 103 mmol/L (ref 98–111)
Creatinine, Ser: 0.88 mg/dL (ref 0.61–1.24)
GFR calc Af Amer: >60 mL/min (ref >60)
GFR calc non Af Amer: >60 mL/min (ref >60)
GLUCOSE: 91 mg/dL (ref 70–99)
Potassium: 4 mmol/L (ref 3.5–5.1)
SODIUM: 135 mmol/L (ref 135–145)
Total Bilirubin: 0.4 mg/dL (ref 0.3–1.2)
Total Protein: 6.9 g/dL (ref 6.5–8.1)

## 2017-12-02 LAB — URINALYSIS, ROUTINE W REFLEX MICROSCOPIC
Bilirubin Urine: NEGATIVE
Glucose, UA: NEGATIVE mg/dL
HGB URINE DIPSTICK: NEGATIVE
KETONES UR: NEGATIVE mg/dL
Leukocytes, UA: NEGATIVE
Nitrite: NEGATIVE
PH: 5 (ref 5.0–8.0)
Protein, ur: NEGATIVE mg/dL
SPECIFIC GRAVITY, URINE: 1.018 (ref 1.005–1.030)

## 2017-12-02 LAB — CBC
HEMATOCRIT: 44.3 % (ref 39.0–52.0)
Hemoglobin: 14.1 g/dL (ref 13.0–17.0)
MCH: 28.9 pg (ref 26.0–34.0)
MCHC: 31.8 g/dL (ref 30.0–36.0)
MCV: 90.8 fL (ref 78.0–100.0)
Platelets: 252 10*3/uL (ref 150–400)
RBC: 4.88 MIL/uL (ref 4.22–5.81)
RDW: 13.4 % (ref 11.5–15.5)
WBC: 8.8 10*3/uL (ref 4.0–10.5)

## 2017-12-02 NOTE — ED Notes (Signed)
ED Provider at bedside. 
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
## 2017-12-02 NOTE — Patient Instructions (Signed)
Medication Instructions:  NONE If you need a refill on your cardiac medications before your next appointment, please call your pharmacy.   Lab work: If you have labs (blood work) drawn today and your tests are completely normal, you will receive your results only by: Marland Kitchen MyChart Message (if you have MyChart) OR . A paper copy in the mail If you have any lab test that is abnormal or we need to change your treatment, we will call you to review the results.  Follow-Up: At [facility], you and your health needs are our priority.  As part of our continuing mission to provide you with exceptional heart care, we have created designated Provider Care Teams.  These Care Teams include your primary Cardiologist (physician) and Advanced Practice Providers (APPs -  Physician Assistants and Nurse Practitioners) who all work together to provide you with the care you need, when you need it. You will need a follow up appointment in 6 months.  Please call our office 2 months in advance to schedule this appointment.  You may see Kirk Ruths MD or one of the following Advanced Practice Providers on your designated Care Team:   Kerin Ransom, PA-C Roby Lofts, [place] . Sande Rives, PA-C
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
## 2017-12-02 NOTE — ED Provider Notes (Signed)
[facility] EMERGENCY DEPARTMENT Provider Note   CSN: [id number] Arrival date & time: 12/02/17  1559     History   Chief Complaint Chief Complaint  Patient presents with  . Altered Mental Status    HPI OUSMANE SEEMAN is a 77 y.o. male.  Patient noted by family w periods increased confusion in the past few weeks. Recent admission w [facility], now off anticoag therapy. Pt is awake and alert, and denies any new complaints. No falls. No headache. No chest pain or discomfort. No abd pain. No dysuria or gu c/o. Normal appetite, and good po intake. No change in meds or new/sedating meds. Sleeps fine at night. Pt/family deny any preceding hx dementia. Pt denies numbness or weakness. Denies change in functional ability. No change in speech or vision. No fever or chills.   The history is provided by the patient.  Altered Mental Status   Associated symptoms include confusion. Pertinent negatives include no weakness.    Past Medical History:  Diagnosis Date  . Arthritis   . Atrial fibrillation ([place])    previously on Coumadin, reversed with head bleed  . Cataract   . Hyperlipidemia   . HYPERTENSION   . HYPOTHYROIDISM   . 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 Pre-diabetes   . Precancerous skin lesion    on back  . ROTATOR CUFF TEAR   . Subdural hematoma [facility])     Patient Active Problem List   Diagnosis Date Noted  . Warfarin anticoagulation   . H/O subdural hemorrhage   . History of burr hole surgery   . Intracerebral hemorrhage ([place]) 11/24/2017  . Intracranial bleeding ([place]) 11/24/2017  . Leukocytosis 02/05/2017  . Hyponatremia 02/05/2017  . Somnolence 02/05/2017  . Acute metabolic encephalopathy 38/11/1749  . Headache 01/28/2017  . Subdural hematoma ([place]) 01/23/2017  . Pre-diabetes 09/14/2015  . Paroxysmal atrial fibrillation ([place]) 03/30/2015  . HLD (hyperlipidemia) 03/02/2015  . Healthcare maintenance 03/02/2015  . Long term  current use of anticoagulant 03/30/2014  . Hypothyroidism 06/26/2008  . Essential hypertension 06/26/2008  . ROTATOR CUFF TEAR 06/26/2008  . DYSPNEA 06/26/2008    Past Surgical History:  Procedure Laterality Date  . APPENDECTOMY    . CATARACT EXTRACTION W/PHACO Right 02/02/2014   Procedure: CATARACT EXTRACTION PHACO AND INTRAOCULAR LENS PLACEMENT (IOC);  Surgeon: Elta Guadeloupe T. Gershon Crane, MD;  Location: [facility];  Service: Ophthalmology;  Laterality: Right;  CDE 12.17  . CATARACT EXTRACTION W/PHACO Left 02/16/2014   Procedure: CATARACT EXTRACTION PHACO AND INTRAOCULAR LENS PLACEMENT ;  Surgeon: Elta Guadeloupe T. Gershon Crane, MD;  Location: [facility];  Service: Ophthalmology;  Laterality: Left;  CDE:13.42  . CRANIOTOMY N/A 02/05/2017   Procedure: Bilateral Burr Holes . Craniotomy for Subdural;  Surgeon: Ditty, Kevan Ny, MD;  Location: [facility];  Service: Neurosurgery;  Laterality: N/A;  . EYE SURGERY  2015   cataract surgery. bilateral  . JOINT REPLACEMENT  2009   bilateral knee replacement  . KNEE SURGERY    . ROTATOR CUFF REPAIR Left   . SHOULDER SURGERY    . TOTAL KNEE ARTHROPLASTY Bilateral         Home Medications    Prior to Admission medications   Medication Sig Start Date End Date Taking? Authorizing Provider  albuterol (PROVENTIL HFA;VENTOLIN HFA) 108 (90 Base) MCG/ACT inhaler Inhale 2 puffs into the lungs every 6 (six) hours as needed for wheezing or shortness of breath. 06/29/16   Evette Doffing,  Berlin Hun, MD  atorvastatin (LIPITOR) 10 MG tablet TAKE 1 TABLET (10 MG TOTAL) BY MOUTH DAILY. Patient taking differently: Take 10 mg by mouth every morning.  09/17/17   Timmothy Euler, MD  diltiazem (CARDIZEM CD) 360 MG 24 hr capsule TAKE 1 CAPSULE (360 MG TOTAL) BY MOUTH DAILY. Patient taking differently: Take 360 mg by mouth every evening.  11/05/17   Dettinger, [name], MD  flecainide (TAMBOCOR) 100 MG tablet TAKE 1 TABLET (100 MG TOTAL) BY MOUTH 2 (TWO) TIMES DAILY. Patient taking differently: Take 100  mg by mouth 2 (two) times daily.  08/23/17   Timmothy Euler, MD  levothyroxine (SYNTHROID, LEVOTHROID) 50 MCG tablet TAKE 2 TABLETS BY MOUTH DAILY Patient taking differently: Take 100 mcg by mouth every morning.  07/29/17   Timmothy Euler, MD  lisinopril (PRINIVIL,ZESTRIL) 20 MG tablet TAKE 1 TABLET (20 MG TOTAL) BY MOUTH DAILY. Patient taking differently: Take 20 mg by mouth every morning.  08/23/17   Timmothy Euler, MD  metFORMIN (GLUCOPHAGE-XR) 500 MG 24 hr tablet TAKE 1 TABLET (500 MG TOTAL) BY MOUTH DAILY WITH BREAKFAST. 10/22/17   Dettinger, [name], MD    Family History Family History  Problem Relation Age of Onset  . Heart attack Brother   . Early death Brother   . Asthma Father   . Vision loss Father   . Hypertension Mother   . Heart attack Brother 7  . Arthritis Brother     Social History Social History   Tobacco Use  . Smoking status: Never Smoker  . Smokeless tobacco: Never Used  Substance Use Topics  . Alcohol use: No    Frequency: Never    Comment: remote use, h/o heavy binge use about 25 years ago  . Drug use: No    Comment: remote h/o marijuana use     Allergies   Patient has no known allergies.   Review of Systems Review of Systems  Constitutional: Negative for fever.  HENT: Negative for sore throat.   Eyes: Negative for visual disturbance.  Respiratory: Negative for cough and shortness of breath.   Cardiovascular: Negative for chest pain.  Gastrointestinal: Negative for abdominal pain, diarrhea and vomiting.  Genitourinary: Negative for dysuria and flank pain.  Musculoskeletal: Negative for back pain and neck pain.  Skin: Negative for rash.  Neurological: Negative for speech difficulty, weakness, numbness and headaches.  Hematological: Does not bruise/bleed easily.  Psychiatric/Behavioral: Positive for confusion.     Physical Exam Updated Vital Signs BP 123/63   Pulse (!) 56   Temp 98.8 F (37.1 C) (Oral)   Resp 20   SpO2 98%     Physical Exam  Constitutional: He appears well-developed and well-nourished. No distress.  HENT:  Head: Atraumatic.  Mouth/Throat: Oropharynx is clear and moist.  Eyes: Pupils are equal, round, and reactive to light. Conjunctivae are normal.  Neck: Neck supple. No tracheal deviation present.  No stiffness or rigidity  Cardiovascular: Normal rate, regular rhythm, normal heart sounds and intact distal pulses. Exam reveals no gallop and no friction rub.  No murmur heard. Pulmonary/Chest: Effort normal and breath sounds normal. No accessory muscle usage. No respiratory distress.  Abdominal: Soft. Bowel sounds are normal. He exhibits no distension. There is no tenderness.  Genitourinary:  Genitourinary Comments: No cva tenderness  Musculoskeletal: He exhibits no edema.  Neurological: He is alert.  Speech clear/fluent. Oriented. Motor intact bil, stre 5/5. sens grossly intact.   Skin: Skin is warm and  dry. No rash noted.  Psychiatric: He has a normal mood and affect.  Nursing note and vitals reviewed.    ED Treatments / Results  Labs (all labs ordered are listed, but only abnormal results are displayed) Results for orders placed or performed during the hospital encounter of 12/02/17  Comprehensive metabolic panel  Result Value Ref Range   Sodium 135 135 - 145 mmol/L   Potassium 4.0 3.5 - 5.1 mmol/L   Chloride 103 98 - 111 mmol/L   CO2 22 22 - 32 mmol/L   Glucose, Bld 91 70 - 99 mg/dL   BUN 13 8 - 23 mg/dL   Creatinine, Ser 0.88 0.61 - 1.24 mg/dL   Calcium 9.0 8.9 - 10.3 mg/dL   Total Protein 6.9 6.5 - 8.1 g/dL   Albumin 3.8 3.5 - 5.0 g/dL   AST 20 15 - 41 U/L   ALT 25 0 - 44 U/L   Alkaline Phosphatase 56 38 - 126 U/L   Total Bilirubin 0.4 0.3 - 1.2 mg/dL   GFR calc non Af Amer >60 >60 mL/min   GFR calc Af Amer >60 >60 mL/min   Anion gap 10 5 - 15  CBC  Result Value Ref Range   WBC 8.8 4.0 - 10.5 K/uL   RBC 4.88 4.22 - 5.81 MIL/uL   Hemoglobin 14.1 13.0 - 17.0 g/dL    HCT 44.3 39.0 - 52.0 %   MCV 90.8 78.0 - 100.0 fL   MCH 28.9 26.0 - 34.0 pg   MCHC 31.8 30.0 - 36.0 g/dL   RDW 13.4 11.5 - 15.5 %   Platelets 252 150 - 400 K/uL   Ct Head Wo Contrast  Result Date: 12/02/2017 CLINICAL DATA:  Confusion for 3 days. EXAM: CT HEAD WITHOUT CONTRAST TECHNIQUE: Contiguous axial images were obtained from the base of the skull through the vertex without intravenous contrast. COMPARISON:  CT head without contrast 11/29/2017. FINDINGS: Brain: Continued evolution of bifrontal parenchymal hemorrhage is noted. The largest hemorrhages in the right frontal lobe measuring 5.6 x 4.2 x 3.8 cm. A more hyperdense component is noted anteriorly and laterally, measuring up to 2.7 cm, also unchanged. A left frontal parenchymal hematoma also demonstrates continued evolution, now measuring 2 cm maximally, unchanged. An inferior right frontal lobe hematoma measures 13 mm in maximal dimension. Midline shift is present anteriorly, right to left measuring up to 10 mm. Diffuse surrounding vasogenic edema is similar the prior exams. There is effacement of the sulci. Overall mass effect is similar prior studies. No new lesions are present. The brainstem and cerebellum are normal. Vascular: Atherosclerotic calcifications are present. There is no hyperdense vessel. Skull: Burr holes are in place. Calvarium is otherwise unremarkable. Sinuses/Orbits: The paranasal sinuses demonstrate scratched at bilateral maxillary polyps or mucous retention cysts are again noted. The paranasal sinuses and mastoid air cells are otherwise clear. Bilateral lens replacements are present. Globes and orbits are within normal limits. IMPRESSION: 1. Continued expected evolution of 3 intraparenchymal frontal hematomas. 2. Similar appearance of surrounding vasogenic edema and midline shift. 3. Hyperdense component adjacent to the right frontal parenchymal hematoma may represent a separate epidural hemorrhage or even meningioma.  Recommend continued surveillance. Electronically Signed   By: San Morelle M.D.   On: 12/02/2017 17:14   Ct Head Wo Contrast  Result Date: 11/29/2017 CLINICAL DATA:  77 y/o  M; intracranial hemorrhage for follow-up. EXAM: CT HEAD WITHOUT CONTRAST TECHNIQUE: Contiguous axial images were obtained from the base of the skull through  the vertex without intravenous contrast. COMPARISON:  11/24/2017 CT head. 11/26/2017 MRI head. FINDINGS: Brain: Stable size of 3 frontal lobe hematomas the largest in the right frontal lobe measuring 5.1 x 3.8 x 4.7 cm (volume = 48 cm^3) in the axial plane (series 3, image 21). The hematomas demonstrating decreased attenuation compatible with interval partial dispersion of blood products. Vasogenic edema associated with the hematomas is slightly increased as is mass effect with 10 mm right-to-left subfalcine herniation (series 3, image 17). No new acute intracranial hemorrhage, focus of mass effect, or stroke identified. Vascular: Calcific atherosclerosis of carotid siphons. No hyperdense vessel identified. Skull: Chronic postsurgical changes related to a left lateral craniotomy as well as bilateral frontal and parietal burr holes. Sinuses/Orbits: Patchy opacification of mastoid air cells. Maxillary sinus mucous retention cyst. Bilateral intra-ocular lens replacement. Other: None. IMPRESSION: 1. Stable size of 3 frontal lobe hematomas the largest in the right frontal lobe. Partial interval dispersion of blood products. Surrounding vasogenic edema is mildly increased as is mass effect with 10 mm right-to-left subfalcine herniation. 2. No new acute intracranial abnormality identified. Electronically Signed   By: Kristine Garbe M.D.   On: 11/29/2017 06:08   Ct Head Wo Contrast  Result Date: 11/24/2017 CLINICAL DATA:  Altered mental status. EXAM: CT HEAD WITHOUT CONTRAST TECHNIQUE: Contiguous axial images were obtained from the base of the skull through the vertex  without intravenous contrast. COMPARISON:  None. FINDINGS: Brain: There is multifocal intraparenchymal hemorrhage. The larger focus is located in the right frontal lobe and measures 5.3 x 3.6 x 4.5 cm (volume = 45 cm^3). Focus in the left frontal lobe measures 1.8 cm. A more inferior right frontal focus measures 10 mm. There is anterior midline shift to the left measuring 6 mm at the level of the foramina of [place]. Brain parenchyma is otherwise normal. Vascular: No abnormal hyperdensity of the major intracranial arteries or dural venous sinuses. No intracranial atherosclerosis. Skull: Old bilateral craniotomies. Sinuses/Orbits: No fluid levels or advanced mucosal thickening of the visualized paranasal sinuses. No mastoid or middle ear effusion. The orbits are normal. IMPRESSION: 1. Multifocal acute intraparenchymal hemorrhage, largest in the right frontal lobe, measuring 45 mL. The multifocality of emerges, while possibly due to hypertension, raise suspicion for metastatic disease. MRI of the brain with and without contrast is recommended, when possible, to assess for possible underlying lesions. 2. The appearance of the hematomata suggests that they are of mixed acuity with some components in the early subacute phase, which is compatible with the reported timeline of the patient's symptoms over days to weeks. 3. Leftward midline shift measuring 6 mm. Critical Value/emergent results were called by telephone at the time of interpretation on 11/24/2017 at 6:51 pm to Dr. Virgel Manifold , who verbally acknowledged these results. Electronically Signed   By: Ulyses Jarred M.D.   On: 11/24/2017 18:55   Mr Jeri Cos PP Contrast  Result Date: 11/26/2017 CLINICAL DATA:  Altered mental status, follow up intracranial hemorrhage. On Coumadin for atrial fibrillation. History of subdural hematoma vacuo a shin. EXAM: MRI HEAD WITHOUT AND WITH CONTRAST TECHNIQUE: Multiplanar, multiecho pulse sequences of the brain and surrounding  structures were obtained without and with intravenous contrast. CONTRAST:  10 cc Gadavist COMPARISON:  CT HEAD November 24, 2017 FINDINGS: INTRACRANIAL CONTENTS: 2 RIGHT and 1 LEFT frontal intraparenchymal hematomas measuring to 5.1 cm on the RIGHT, proportional surrounding vasogenic edema and regional mass effect. Hematomas demonstrate heterogeneous plate dark shading on FLAIR sequence and, mild intrinsic T1  shortening. Scant superimposed enhancement of the hematomas. 5 mm RIGHT to LEFT subfalcine herniation. Mass effect on RIGHT frontal horn lateral ventricle without entrapment or hydrocephalus. No suspicious intracranial or extra-axial enhancement. No abnormal extra-axial fluid collections. Convexity susceptibility artifact associated with old subdural hematomas and burr holes. No reduced diffusion to suggest acute ischemia. VASCULAR: Normal major intracranial vascular flow voids present at skull base. SKULL AND UPPER CERVICAL SPINE: No abnormal sellar expansion. No suspicious calvarial bone marrow signal. LEFT craniotomy. Four convexity holes. Craniocervical junction maintained. SINUSES/ORBITS: Bilateral mastoid effusions and scattered mucosal retention cyst. Status post bilateral ocular lens implants.The included ocular globes and orbital contents are non-suspicious. OTHER: None. IMPRESSION: 1. Bifrontal early subacute hematomas measuring to 5.1 cm on the RIGHT. Proportional vasogenic edema, 5 mm RIGHT to LEFT subfalcine herniation. 2. Status post subdural hematoma evacuation without re-accumulation. Electronically Signed   By: Elon Alas M.D.   On: 11/26/2017 02:58    EKG None  Radiology Ct Head Wo Contrast  Result Date: 12/02/2017 CLINICAL DATA:  Confusion for 3 days. EXAM: CT HEAD WITHOUT CONTRAST TECHNIQUE: Contiguous axial images were obtained from the base of the skull through the vertex without intravenous contrast. COMPARISON:  CT head without contrast 11/29/2017. FINDINGS: Brain:  Continued evolution of bifrontal parenchymal hemorrhage is noted. The largest hemorrhages in the right frontal lobe measuring 5.6 x 4.2 x 3.8 cm. A more hyperdense component is noted anteriorly and laterally, measuring up to 2.7 cm, also unchanged. A left frontal parenchymal hematoma also demonstrates continued evolution, now measuring 2 cm maximally, unchanged. An inferior right frontal lobe hematoma measures 13 mm in maximal dimension. Midline shift is present anteriorly, right to left measuring up to 10 mm. Diffuse surrounding vasogenic edema is similar the prior exams. There is effacement of the sulci. Overall mass effect is similar prior studies. No new lesions are present. The brainstem and cerebellum are normal. Vascular: Atherosclerotic calcifications are present. There is no hyperdense vessel. Skull: Burr holes are in place. Calvarium is otherwise unremarkable. Sinuses/Orbits: The paranasal sinuses demonstrate scratched at bilateral maxillary polyps or mucous retention cysts are again noted. The paranasal sinuses and mastoid air cells are otherwise clear. Bilateral lens replacements are present. Globes and orbits are within normal limits. IMPRESSION: 1. Continued expected evolution of 3 intraparenchymal frontal hematomas. 2. Similar appearance of surrounding vasogenic edema and midline shift. 3. Hyperdense component adjacent to the right frontal parenchymal hematoma may represent a separate epidural hemorrhage or even meningioma. Recommend continued surveillance. Electronically Signed   By: San Morelle M.D.   On: 12/02/2017 17:14    Procedures Procedures (including critical care time)  Medications Ordered in ED Medications - No data to display   Initial Impression / Assessment and Plan / ED Course  I have reviewed the triage vital signs and the nursing notes.  Pertinent labs & imaging results that were available during my care of the patient were reviewed by me and considered in my  medical decision making (see chart for details).  Labs. Imaging.  Ct reviewed - no acute change or worsening from prior.  Labs reviewed - chem normal.  Reviewed nursing notes and prior charts for additional history.   Pt remains alert, oriented, speech clear.   SW/CM asked to eval, talk w family, and to arrange home health services.   Will also make referral to neurology for close outpt f/u.   Pt remains complaint free, and appears stable for d/c.     Final Clinical Impressions(s) / ED  Diagnoses   Final diagnoses:  None    ED Discharge Orders    None       Lajean Saver, MD 12/02/17 1931

## 2017-12-02 NOTE — Discharge Instructions (Signed)
It was our pleasure to provide your ER care today - we hope that you feel better.  We have made referral for home health services - nurse, aide, PT/OT.  Follow up with primary care doctor in the coming week.  Also follow up with neurologist in the next 1-2 weeks - call office to arrange appointment.   Return to ER if worse, new symptoms, fevers, new or severe pain, change in speech or vision, new numbness/weakness, other concern.

## 2017-12-02 NOTE — ED Notes (Signed)
Pt and family given water to drink

## 2017-12-02 NOTE — Progress Notes (Addendum)
CSW and RNCM met with pt, pt's wife, and family member at bedside. Pt lives at home with wife and family member. Pt and pt's family members agreeable to pt transitioning home with home health when medically cleared. Pt and pt's family members did not express any concerns about pt's safety transitioning home. Pt active with [facility]. CSW reached out to RN assigned to pt's case, notifying her that pt in the ED.   RNCM will establish pt with home health via [facility] when pt medically cleared to transition home.  Wendelyn Breslow, [facility]  [phone]

## 2017-12-02 NOTE — ED Notes (Signed)
Pt given urinal to provide sample 

## 2017-12-02 NOTE — Care Management Important Message (Signed)
Important Message  Patient Details  Name: [name] [name] MRN: [id number] Date of Birth: [birth year] Medicare Important Message Given:  Yes Patient signed on 11/29/2017   Orbie Pyo 12/02/2017, 8:19 AM

## 2017-12-03 NOTE — Consult Note (Addendum)
            [facility] CM Primary Care Navigator  12/03/2017  JAQUIN COY 1941/02/13 [id number]   Received an in-basket message from [facility], [place] notifying that patient was seen in the ED last night. Patient was cleared to transition to home last night with home health services via [facility].   Patient has discharge instruction to follow-up with primary care provider in 1 week post recent hospitalization on 9/29- 10/4.   Primary care provider's office called (spoke to [facility]) to notifyofrecent hospital discharge and ED visit and discharge last night. Also reminded of need for post hospital follow-up and transition of care. Notified her of patient'shealth issues needingfollow-up. Was informed that patient had a scheduled follow-up with primary care provider on 10/4 but was cancelled since he was still in the hospital then.   Made aware to refer patient to [facility] care management if deemed necessaryandappropriate for any services.   For additional questions please contact:  Edwena Felty A. Zyrell Carmean, BSN, RN-BC [facility] PRIMARY CARE Navigator Cell: [phone]

## 2017-12-04 ENCOUNTER — Telehealth: Payer: Self-pay | Admitting: *Deleted

## 2017-12-04 DIAGNOSIS — I69228 Other speech and language deficits following other nontraumatic intracranial hemorrhage: Secondary | ICD-10-CM | POA: Diagnosis not present

## 2017-12-04 DIAGNOSIS — H5462 Unqualified visual loss, left eye, normal vision right eye: Secondary | ICD-10-CM | POA: Diagnosis not present

## 2017-12-04 DIAGNOSIS — I4891 Unspecified atrial fibrillation: Secondary | ICD-10-CM | POA: Diagnosis not present

## 2017-12-04 DIAGNOSIS — M1991 Primary osteoarthritis, unspecified site: Secondary | ICD-10-CM | POA: Diagnosis not present

## 2017-12-04 DIAGNOSIS — E039 Hypothyroidism, unspecified: Secondary | ICD-10-CM | POA: Diagnosis not present

## 2017-12-04 DIAGNOSIS — I10 Essential (primary) hypertension: Secondary | ICD-10-CM | POA: Diagnosis not present

## 2017-12-04 DIAGNOSIS — R7303 Prediabetes: Secondary | ICD-10-CM | POA: Diagnosis not present

## 2017-12-04 DIAGNOSIS — Z96653 Presence of artificial knee joint, bilateral: Secondary | ICD-10-CM | POA: Diagnosis not present

## 2017-12-04 DIAGNOSIS — E785 Hyperlipidemia, unspecified: Secondary | ICD-10-CM | POA: Diagnosis not present

## 2017-12-04 NOTE — Telephone Encounter (Signed)
Spoke with patient's wife. He tells them that he does not have any pain anywhere. She says that he still isn't acting like his normal self. Stated that it's like he is in shock. She assists him with daily care activities. No real concerns at this time. Appt scheduled with Dr Dettinger for next week. They will f/u sooner if anything changes.

## 2017-12-05 DIAGNOSIS — I610 Nontraumatic intracerebral hemorrhage in hemisphere, subcortical: Secondary | ICD-10-CM | POA: Diagnosis not present

## 2017-12-05 DIAGNOSIS — Z6841 Body Mass Index (BMI) 40.0 and over, adult: Secondary | ICD-10-CM | POA: Diagnosis not present

## 2017-12-10 ENCOUNTER — Ambulatory Visit (HOSPITAL_COMMUNITY)
Admission: RE | Admit: 2017-12-10 | Discharge: 2017-12-10 | Disposition: A | Discharge status: Home / Self Care | Payer: PPO | Source: Ambulatory Visit | Attending: Family Medicine | Admitting: Family Medicine

## 2017-12-10 ENCOUNTER — Ambulatory Visit (INDEPENDENT_AMBULATORY_CARE_PROVIDER_SITE_OTHER): Payer: PPO | Admitting: Family Medicine

## 2017-12-10 ENCOUNTER — Encounter: Payer: Self-pay | Admitting: Family Medicine

## 2017-12-10 ENCOUNTER — Ambulatory Visit (HOSPITAL_COMMUNITY): Payer: PPO

## 2017-12-10 VITALS — BP 109/55 | HR 59 | Temp 97.0°F | Ht 69.0 in | Wt 284.2 lb

## 2017-12-10 DIAGNOSIS — R29818 Other symptoms and signs involving the nervous system: Secondary | ICD-10-CM

## 2017-12-10 DIAGNOSIS — X58XXXA Exposure to other specified factors, initial encounter: Secondary | ICD-10-CM | POA: Insufficient documentation

## 2017-12-10 DIAGNOSIS — S06340D Traumatic hemorrhage of right cerebrum without loss of consciousness, subsequent encounter: Secondary | ICD-10-CM | POA: Diagnosis not present

## 2017-12-10 DIAGNOSIS — M7989 Other specified soft tissue disorders: Secondary | ICD-10-CM | POA: Diagnosis not present

## 2017-12-10 DIAGNOSIS — S065X9A Traumatic subdural hemorrhage with loss of consciousness of unspecified duration, initial encounter: Secondary | ICD-10-CM | POA: Insufficient documentation

## 2017-12-10 DIAGNOSIS — S065XAA Traumatic subdural hemorrhage with loss of consciousness status unknown, initial encounter: Secondary | ICD-10-CM

## 2017-12-10 DIAGNOSIS — I6200 Nontraumatic subdural hemorrhage, unspecified: Secondary | ICD-10-CM | POA: Diagnosis not present

## 2017-12-10 DIAGNOSIS — R482 Apraxia: Secondary | ICD-10-CM | POA: Diagnosis not present

## 2017-12-10 DIAGNOSIS — R531 Weakness: Secondary | ICD-10-CM | POA: Diagnosis not present

## 2017-12-10 DIAGNOSIS — R2242 Localized swelling, mass and lump, left lower limb: Secondary | ICD-10-CM

## 2017-12-10 DIAGNOSIS — S06310D Contusion and laceration of right cerebrum without loss of consciousness, subsequent encounter: Secondary | ICD-10-CM

## 2017-12-10 NOTE — Progress Notes (Signed)
BP (!) 109/55   Pulse (!) 59   Temp (!) 97 F (36.1 C) (Oral)   Ht 5\' 9"  (1.753 m)   Wt 284 lb 3.2 oz (128.9 kg)   BMI 41.97 kg/m    Subjective:    Patient ID: [name] [name], male    DOB: 04/17/1940, 77 y.o.   MRN: [id number]  HPI: [name] [name] is a 77 y.o. male presenting on 12/10/2017 for Hospitalization Follow-up (9/29 &10/7- intracranial hermorrhage- Stating that he has having weakness in left side.); Knee Pain (Left knee x 1 week); and Leg Swelling (left lower leg )   HPI Patient is coming in for hospital and ER follow-up.  He was seen in the hospital both on 9/29 and 12/02/2017.  He had an initial trauma where a door from the back of his [employer]i hit the front of his head as it was popping open and he was taken to the emergency department and found to have an intracranial hemorrhage in the frontal region.  He went back on 12/02/2017 because he developed some confusion that was new.  He is coming in here today for follow-up and he had a CT scan and MRI on the first event and then a repeat CT scan on the second ER visit.  His family who is here with him says he still been somewhat confused but they have noticed now that he is displaying some new left-sided weakness that was not there as much previously.  They are just concerned with his new symptoms that he has been having that is been changed from previous.  They also feel like his memory and focus and concentration has been fading.  They say especially his ability to follow commands when I asked him to do something has been challenging.  They say that he will say yes he will do it and then not do it sometimes.  Relevant past medical, surgical, family and social history reviewed and updated as indicated. Interim medical history since our last visit reviewed. Allergies and medications reviewed and updated.  Review of Systems  Constitutional: Negative for chills and fever.  Eyes: Negative for visual disturbance.  Respiratory: Negative for  shortness of breath and wheezing.   Cardiovascular: Negative for chest pain and leg swelling.  Musculoskeletal: Negative for back pain and gait problem.  Skin: Negative for rash.  Neurological: Positive for weakness. Negative for dizziness and headaches.  Psychiatric/Behavioral: Positive for confusion.  All other systems reviewed and are negative.   Per HPI unless specifically indicated above   Allergies as of 12/10/2017   No Known Allergies     Medication List        Accurate as of 12/10/17 12:18 PM. Always use your most recent med list.          albuterol 108 (90 Base) MCG/ACT inhaler Commonly known as:  PROVENTIL HFA;VENTOLIN HFA Inhale 2 puffs into the lungs every 6 (six) hours as needed for wheezing or shortness of breath.   atorvastatin 10 MG tablet Commonly known as:  LIPITOR TAKE 1 TABLET (10 MG TOTAL) BY MOUTH DAILY.   diltiazem 360 MG 24 hr capsule Commonly known as:  CARDIZEM CD TAKE 1 CAPSULE (360 MG TOTAL) BY MOUTH DAILY.   flecainide 100 MG tablet Commonly known as:  TAMBOCOR TAKE 1 TABLET (100 MG TOTAL) BY MOUTH 2 (TWO) TIMES DAILY.   levothyroxine 50 MCG tablet Commonly known as:  SYNTHROID, LEVOTHROID TAKE 2 TABLETS BY MOUTH DAILY   lisinopril  20 MG tablet Commonly known as:  PRINIVIL,ZESTRIL TAKE 1 TABLET (20 MG TOTAL) BY MOUTH DAILY.   metFORMIN 500 MG 24 hr tablet Commonly known as:  GLUCOPHAGE-XR TAKE 1 TABLET (500 MG TOTAL) BY MOUTH DAILY WITH BREAKFAST.          Objective:    BP (!) 109/55   Pulse (!) 59   Temp (!) 97 F (36.1 C) (Oral)   Ht 5\' 9"  (1.753 m)   Wt 284 lb 3.2 oz (128.9 kg)   BMI 41.97 kg/m   Wt Readings from Last 3 Encounters:  12/10/17 284 lb 3.2 oz (128.9 kg)  12/02/17 290 lb (131.5 kg)  11/28/17 282 lb 3 oz (128 kg)    Physical Exam  Constitutional: He is oriented to person, place, and time. He appears well-developed and well-nourished. No distress.  Eyes: Pupils are equal, round, and reactive to light.  Conjunctivae and EOM are normal. No scleral icterus.  Neck: Neck supple. No thyromegaly present.  Cardiovascular: Normal rate, regular rhythm, normal heart sounds and intact distal pulses.  No murmur heard. Pulmonary/Chest: Effort normal and breath sounds normal. No respiratory distress. He has no wheezes.  Musculoskeletal: Normal range of motion. He exhibits no edema.  Lymphadenopathy:    He has no cervical adenopathy.  Neurological: He is alert and oriented to person, place, and time. He displays normal reflexes. No cranial nerve deficit. He exhibits normal muscle tone (No focal abnormal tone noted on exam, 4 out of 5 strength in all 4 extremities). Coordination normal.  Some difficulty following some commands, inconsistent sometimes on following commands of EOM and some motor commands.  Skin: Skin is warm and dry. No rash noted. He is not diaphoretic.  Psychiatric: He has a normal mood and affect. His behavior is normal.  Nursing note and vitals reviewed.       Assessment & Plan:   Problem List Items Addressed This Visit      Nervous and Auditory   Subdural hematoma (HCC) - Primary   Relevant Orders   CT Head Wo Contrast (Completed)   MR BRAIN W WO CONTRAST (Completed)    Other Visit Diagnoses    Left-sided weakness       They feel like the left-sided weakness has been new since October 10, will do repeat CT head   Relevant Orders   CT Head Wo Contrast (Completed)   MR BRAIN W WO CONTRAST (Completed)   Motor apraxia       Relevant Orders   CT Head Wo Contrast (Completed)   MR BRAIN W WO CONTRAST (Completed)   Localized swelling of left lower leg       Relevant Orders   US Venous Img Lower Unilateral Left (Completed)   Other symptoms and signs involving the nervous system       Relevant Orders   CT Head Wo Contrast (Completed)   Intraparenchymal hematoma of brain, right, without loss of consciousness, subsequent encounter       Relevant Orders   MR BRAIN W WO CONTRAST  (Completed)       Follow up plan: Return if symptoms worsen or fail to improve.  Counseling provided for all of the vaccine components Orders Placed This Encounter  Procedures  . CT Head Wo Contrast  . US Venous Img Lower Unilateral Left    Caryl Pina, MD [facility] 12/10/2017, 12:18 PM

## 2017-12-12 ENCOUNTER — Ambulatory Visit (HOSPITAL_COMMUNITY)
Admission: RE | Admit: 2017-12-12 | Discharge: 2017-12-12 | Disposition: A | Discharge status: Home / Self Care | Payer: PPO | Source: Ambulatory Visit | Attending: Family Medicine | Admitting: Family Medicine

## 2017-12-12 ENCOUNTER — Telehealth: Payer: Self-pay | Admitting: Family Medicine

## 2017-12-12 DIAGNOSIS — X58XXXA Exposure to other specified factors, initial encounter: Secondary | ICD-10-CM | POA: Insufficient documentation

## 2017-12-12 DIAGNOSIS — R482 Apraxia: Secondary | ICD-10-CM | POA: Diagnosis not present

## 2017-12-12 DIAGNOSIS — S06340D Traumatic hemorrhage of right cerebrum without loss of consciousness, subsequent encounter: Secondary | ICD-10-CM

## 2017-12-12 DIAGNOSIS — R531 Weakness: Secondary | ICD-10-CM | POA: Insufficient documentation

## 2017-12-12 DIAGNOSIS — S065XAA Traumatic subdural hemorrhage with loss of consciousness status unknown, initial encounter: Secondary | ICD-10-CM

## 2017-12-12 DIAGNOSIS — S065X9A Traumatic subdural hemorrhage with loss of consciousness of unspecified duration, initial encounter: Secondary | ICD-10-CM | POA: Diagnosis not present

## 2017-12-12 DIAGNOSIS — I6200 Nontraumatic subdural hemorrhage, unspecified: Secondary | ICD-10-CM | POA: Diagnosis not present

## 2017-12-12 DIAGNOSIS — S06310D Contusion and laceration of right cerebrum without loss of consciousness, subsequent encounter: Secondary | ICD-10-CM

## 2017-12-12 MED ORDER — GADOBUTROL 1 MMOL/ML IV SOLN
10.0000 mL | Freq: Once | INTRAVENOUS | Status: AC | PRN
  Administered 2017-12-12: 10 mL via INTRAVENOUS

## 2017-12-12 NOTE — Telephone Encounter (Signed)
Attempted to contact patient to give them results from MRI but MRI did not show any signs of mass lesion but just swelling and that healing bleeds from previous.  He needs to follow-up with his neurologist for these.

## 2017-12-13 ENCOUNTER — Telehealth: Payer: Self-pay | Admitting: *Deleted

## 2017-12-13 NOTE — Telephone Encounter (Signed)
Incoming call from pt's wife stating pt is experiencing new onset weakness and nausea Pt is also having trouble swallowing Due to recent subdural hemmorhage Dr Dettinger spoke with wife and instructed to take pt to ER for evaluation Wife verbalizes understanding

## 2017-12-13 NOTE — Telephone Encounter (Signed)
Pt's wife aware of MRI results

## 2017-12-17 ENCOUNTER — Other Ambulatory Visit: Payer: Self-pay

## 2017-12-17 ENCOUNTER — Emergency Department (HOSPITAL_COMMUNITY)
Admission: EM | Admit: 2017-12-17 | Discharge: 2017-12-17 | Disposition: A | Discharge status: Home / Self Care | Payer: PPO | Attending: Emergency Medicine | Admitting: Emergency Medicine

## 2017-12-17 ENCOUNTER — Emergency Department (HOSPITAL_COMMUNITY): Payer: PPO

## 2017-12-17 ENCOUNTER — Encounter (HOSPITAL_COMMUNITY): Payer: Self-pay

## 2017-12-17 DIAGNOSIS — R51 Headache: Secondary | ICD-10-CM | POA: Diagnosis not present

## 2017-12-17 DIAGNOSIS — E039 Hypothyroidism, unspecified: Secondary | ICD-10-CM | POA: Diagnosis not present

## 2017-12-17 DIAGNOSIS — I10 Essential (primary) hypertension: Secondary | ICD-10-CM | POA: Diagnosis not present

## 2017-12-17 DIAGNOSIS — Z79899 Other long term (current) drug therapy: Secondary | ICD-10-CM | POA: Insufficient documentation

## 2017-12-17 DIAGNOSIS — R519 Headache, unspecified: Secondary | ICD-10-CM

## 2017-12-17 MED ORDER — ATORVASTATIN CALCIUM 10 MG PO TABS: 10.0000 mg | ORAL_TABLET | Freq: Every day | ORAL | 0 refills | Status: AC

## 2017-12-17 NOTE — ED Provider Notes (Signed)
Patient placed in Quick Look pathway, seen and evaluated   Chief Complaint: Headache  HPI:   [name] [name] is a 77 y.o. male with history of brain bleed, who presents to the emergency department for evaluation after he had a sudden onset severe headache yesterday afternoon, which has since resolved but patient reports given his history he was concerned and felt like he should come and get it evaluated.  He denies any associated vision changes, changes in speech or swallowing, numbness, weakness or numbness.  No nausea or vomiting.  He is followed by Dr. Arnoldo Morale neurosurgery ever since his bleed.  Is no longer on blood thinners.  No recent falls or head trauma.  ROS: + Headache -vision changes, numbness, weakness, nausea, vomiting, dizziness  Physical Exam:   Gen: No distress  Neuro: Awake and Alert  Skin: Warm    Focused Exam:  Speech is clear, able to follow commands  CN III-XII intact  Normal strength in upper and lower extremities bilaterally including dorsiflexion and plantar flexion, strong and equal grip strength  Sensation normal to light and sharp touch  Moves extremities without ataxia, coordination intact  Normal finger to nose and rapid alternating movements  No pronator drift   Initiation of care has begun. The patient has been counseled on the process, plan, and necessity for staying for the completion/evaluation, and the remainder of the medical screening examination    Janet Berlin 12/17/17 1425    Valarie Merino, MD 12/17/17 [phone]

## 2017-12-17 NOTE — ED Provider Notes (Signed)
[facility] EMERGENCY DEPARTMENT Provider Note   CSN: [id number] Arrival date & time: 12/17/17  1356     History   Chief Complaint Chief Complaint  Patient presents with  . Headache    HPI [name] [name] is a 77 y.o. male.  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  He is brought to the emergency department for nausea with some increasing headache over the past 3 to 4 days.  Patient has had cognitive and behavioral disturbances since the traumatic injury in December 2018.  These have not significantly changed.  No vomiting today.  Scheduled to see neurosurgery on November 1 for follow-up.  Denies new arm or leg weakness.  No fevers or chills.  Patient reports mild headache at this time without nausea.   Past Medical History:  Diagnosis Date  . Arthritis   . Atrial fibrillation ([place])    previously on Coumadin, reversed with head bleed  . Cataract   . Hyperlipidemia   . HYPERTENSION   . HYPOTHYROIDISM   . 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 Pre-diabetes   . Precancerous skin lesion    on back  . ROTATOR CUFF TEAR   . Subdural hematoma [facility])     Patient Active Problem List   Diagnosis Date Noted  . Warfarin anticoagulation   . H/O subdural hemorrhage   . History of burr hole surgery   . Intracerebral hemorrhage ([place]) 11/24/2017  . Intracranial bleeding ([place]) 11/24/2017  . Leukocytosis 02/05/2017  . Hyponatremia 02/05/2017  . Somnolence 02/05/2017  . Acute metabolic encephalopathy 82/42/3536  . Headache 01/28/2017  . Subdural hematoma ([place]) 01/23/2017  . Pre-diabetes 09/14/2015  . Paroxysmal atrial fibrillation ([place]) 03/30/2015  . HLD (hyperlipidemia) 03/02/2015  . Healthcare maintenance 03/02/2015  . Long term current use of anticoagulant 03/30/2014  . Hypothyroidism 06/26/2008  . Essential hypertension 06/26/2008  . ROTATOR CUFF  TEAR 06/26/2008  . DYSPNEA 06/26/2008    Past Surgical History:  Procedure Laterality Date  . APPENDECTOMY    . CATARACT EXTRACTION W/PHACO Right 02/02/2014   Procedure: CATARACT EXTRACTION PHACO AND INTRAOCULAR LENS PLACEMENT (IOC);  Surgeon: Elta Guadeloupe T. Gershon Crane, MD;  Location: [facility];  Service: Ophthalmology;  Laterality: Right;  CDE 12.17  . CATARACT EXTRACTION W/PHACO Left 02/16/2014   Procedure: CATARACT EXTRACTION PHACO AND INTRAOCULAR LENS PLACEMENT ;  Surgeon: Elta Guadeloupe T. Gershon Crane, MD;  Location: [facility];  Service: Ophthalmology;  Laterality: Left;  CDE:13.42  . CRANIOTOMY N/A 02/05/2017   Procedure: Bilateral Burr Holes . Craniotomy for Subdural;  Surgeon: Ditty, Kevan Ny, MD;  Location: [facility];  Service: Neurosurgery;  Laterality: N/A;  . EYE SURGERY  2015   cataract surgery. bilateral  . JOINT REPLACEMENT  2009   bilateral knee replacement  . KNEE SURGERY    . ROTATOR CUFF REPAIR Left   . SHOULDER SURGERY    . TOTAL KNEE ARTHROPLASTY Bilateral         Home Medications    Prior to Admission medications   Medication Sig Start Date End Date Taking? Authorizing Provider  acetaminophen (TYLENOL) 500 MG tablet Take 500-1,000 mg by mouth every 6 (six) hours as needed for mild pain or headache.   Yes [provider]  albuterol (PROVENTIL HFA;VENTOLIN HFA) 108 (90 Base) MCG/ACT inhaler Inhale 2 puffs into the lungs every 6 (six) hours as needed for wheezing or shortness of  breath. 06/29/16  Yes Eustaquio Maize, MD  atorvastatin (LIPITOR) 10 MG tablet Take 1 tablet (10 mg total) by mouth daily. 12/17/17  Yes Chipper Herb, MD  diltiazem (CARDIZEM CD) 360 MG 24 hr capsule TAKE 1 CAPSULE (360 MG TOTAL) BY MOUTH DAILY. Patient taking differently: Take 360 mg by mouth every evening.  11/05/17  Yes Dettinger, [name], MD  flecainide (TAMBOCOR) 100 MG tablet TAKE 1 TABLET (100 MG TOTAL) BY MOUTH 2 (TWO) TIMES DAILY. Patient taking differently: Take 100 mg by mouth 2 (two) times  daily.  08/23/17  Yes Timmothy Euler, MD  levothyroxine (SYNTHROID, LEVOTHROID) 50 MCG tablet TAKE 2 TABLETS BY MOUTH DAILY Patient taking differently: Take 100 mcg by mouth daily before breakfast.  07/29/17  Yes Timmothy Euler, MD  lisinopril (PRINIVIL,ZESTRIL) 20 MG tablet TAKE 1 TABLET (20 MG TOTAL) BY MOUTH DAILY. 08/23/17  Yes Timmothy Euler, MD  metFORMIN (GLUCOPHAGE-XR) 500 MG 24 hr tablet TAKE 1 TABLET (500 MG TOTAL) BY MOUTH DAILY WITH BREAKFAST. 10/22/17  Yes Dettinger, [name], MD    Family History Family History  Problem Relation Age of Onset  . Heart attack Brother   . Early death Brother   . Asthma Father   . Vision loss Father   . Hypertension Mother   . Heart attack Brother 70  . Arthritis Brother     Social History Social History   Tobacco Use  . Smoking status: Never Smoker  . Smokeless tobacco: Never Used  Substance Use Topics  . Alcohol use: No    Frequency: Never    Comment: remote use, h/o heavy binge use about 25 years ago  . Drug use: No    Comment: remote h/o marijuana use     Allergies   Patient has no known allergies.   Review of Systems Review of Systems  All other systems reviewed and are negative.    Physical Exam Updated Vital Signs BP (!) 103/33   Pulse 65   Temp 98.4 F (36.9 C) (Oral)   Resp 16   SpO2 99%   Physical Exam  Constitutional: He is oriented to person, place, and time. He appears well-developed and well-nourished.  HENT:  Head: Normocephalic and atraumatic.  Eyes: Pupils are equal, round, and reactive to light. EOM are normal.  Neck: Normal range of motion.  Cardiovascular: Normal rate, regular rhythm, normal heart sounds and intact distal pulses.  Pulmonary/Chest: Effort normal and breath sounds normal. No respiratory distress.  Abdominal: Soft. He exhibits no distension. There is no tenderness.  Musculoskeletal: Normal range of motion.  Neurological: He is alert and oriented to person, place, and  time.  5/5 strength in major muscle groups of  bilateral upper and lower extremities. Speech normal. No facial asymetry.   Skin: Skin is warm and dry.  Psychiatric: He has a normal mood and affect. Judgment normal.  Nursing note and vitals reviewed.    ED Treatments / Results  Labs (all labs ordered are listed, but only abnormal results are displayed) Labs Reviewed - No data to display  EKG None  Radiology Ct Head Wo Contrast  Result Date: 12/17/2017 CLINICAL DATA:  Head trauma December 2018, struck head with tailgate, history of intracranial hemorrhage, now with worsening headache for 2 weeks, history atrial fibrillation, hypertension EXAM: CT HEAD WITHOUT CONTRAST TECHNIQUE: Contiguous axial images were obtained from the base of the skull through the vertex without intravenous contrast. Sagittal and coronal MPR images reconstructed from axial  data set. COMPARISON:  12/10/2017 FINDINGS: Brain: Minimal dilatation of the frontal horn of the RIGHT lateral ventricle since the previous exam. Decrease in white matter edema in the RIGHT frontal lobe. Decreased RIGHT to LEFT midline shift from 13 mm to 5 mm. Large area of encephalomalacia within the RIGHT frontal lobe lower in attenuation versus prior study due to evolving hemorrhage. Additional adjacent RIGHT frontal hematoma measuring 28 x 13 x 18 mm not significantly changed. Focus of intraparenchymal hemorrhage at the LEFT frontal lobe is little changed, 18 mm diameter. Focus of acute hemorrhage at RIGHT frontotemporal lobe 10 mm transverse previously 12 mm. No new areas of hemorrhage or infarction. Vascular: Atherosclerotic calcification of internal carotid arteries bilaterally at skull base Skull: RIGHT parietal burr holes. Prior LEFT parietal craniotomy. No acute osseous findings. Sinuses/Orbits: Small amount of fluid within LEFT mastoid air cells. Mucosal retention cyst at anterior LEFT maxillary sinus. Remaining visualized paranasal sinuses and  mastoid air cells clear. Other: N/A IMPRESSION: BILATERAL intraparenchymal hemorrhages show little interval change in size. Interval decrease in attenuation of an additional large area of intraparenchymal hemorrhage at the RIGHT frontal lobe with decreased surrounding edema, less midline shift, and interval mild dilatation of the frontal horn of the RIGHT lateral ventricle. No new intracranial abnormalities. Electronically Signed   By: Lavonia Dana M.D.   On: 12/17/2017 16:06    Procedures Procedures (including critical care time)  Medications Ordered in ED Medications - No data to display   Initial Impression / Assessment and Plan / ED Course  I have reviewed the triage vital signs and the nursing notes.  Pertinent labs & imaging results that were available during my care of the patient were reviewed by me and considered in my medical decision making (see chart for details).     CT imaging today demonstrates no significant new abnormality.  If anything there has been some improvement as compared to his prior CT.  At this time I do not think he needs acute neurosurgical intervention.  He will need close follow-up with his primary care physician as well as his neurosurgical team and will likely benefit from a neurologist as well.  I long discussion with the patient's spouse regarding traumatic brain injury and some thought that his cognitive function may not return back to his prior baseline before his injury in December 2018.  He may benefit from evaluation by a traumatic brain injury center for appropriate rehabilitation.  Overall well-appearing here in the emergency department.  Stable for discharge.  Abdominal exam is benign.  Vital signs are stable.  Final Clinical Impressions(s) / ED Diagnoses   Final diagnoses:  Nonintractable headache, unspecified chronicity pattern, unspecified headache type    ED Discharge Orders    None       Jola Schmidt, MD 12/17/17 1926
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
## 2017-12-18 ENCOUNTER — Ambulatory Visit (INDEPENDENT_AMBULATORY_CARE_PROVIDER_SITE_OTHER): Payer: PPO

## 2017-12-18 DIAGNOSIS — Z96653 Presence of artificial knee joint, bilateral: Secondary | ICD-10-CM | POA: Diagnosis not present

## 2017-12-18 DIAGNOSIS — E785 Hyperlipidemia, unspecified: Secondary | ICD-10-CM

## 2017-12-18 DIAGNOSIS — E039 Hypothyroidism, unspecified: Secondary | ICD-10-CM

## 2017-12-18 DIAGNOSIS — I10 Essential (primary) hypertension: Secondary | ICD-10-CM | POA: Diagnosis not present

## 2017-12-18 DIAGNOSIS — I4891 Unspecified atrial fibrillation: Secondary | ICD-10-CM

## 2017-12-18 DIAGNOSIS — H5462 Unqualified visual loss, left eye, normal vision right eye: Secondary | ICD-10-CM

## 2017-12-18 DIAGNOSIS — M1991 Primary osteoarthritis, unspecified site: Secondary | ICD-10-CM

## 2017-12-18 DIAGNOSIS — I69228 Other speech and language deficits following other nontraumatic intracranial hemorrhage: Secondary | ICD-10-CM

## 2017-12-18 DIAGNOSIS — R7303 Prediabetes: Secondary | ICD-10-CM

## 2017-12-23 ENCOUNTER — Other Ambulatory Visit: Payer: Self-pay

## 2017-12-23 NOTE — Patient Outreach (Signed)
[employer] [facility] [facility]) Care Management  12/23/2017  [name] [name] 08-Jul-1940 [id number]   Telephone Screen  Referral Date: 12/20/17 Referral Source: [facility] Dept Referral Reason: " member request med co-pay assistance for Xarelto" Insurance: [employer]   Outreach attempt #1 to patient. No answer at present. RN CM will left HIPAA compliant voicemail message along with contact info.       Plan: RN CM will make outreach attempt to patient within 3-4 business days.  RN CM will send unsuccessful outreach letter to patient.   [name], RN,BSN,CCM [place] [facility] Telephonic Care Management Coordinator Direct Phone: [phone] Toll Free: [phone] Fax: [phone]

## 2017-12-24 ENCOUNTER — Other Ambulatory Visit: Payer: Self-pay

## 2017-12-24 NOTE — Patient Outreach (Signed)
[name] [name] [facility]) Care Management  12/24/2017  [name] [name] 04-Sep-1940 [id number]   Telephone Screen  Referral Date: 12/20/17 Referral Source: [facility] Referral Reason: " member request med co-pay assistance for Xarelto" Insurance: [employer]   Outreach attempt #2 to patient. Spoke with patient and discussed referral. Patient voices that he was switched from Xarelto to Coumadin at the end of last year due to cheaper cost. However, he voices that he has been off all blood thinners until further notice following a subdural hematoma a few months ago. Patient does not know when or if he will ever resume blood thinner. He voices that he is currently able to afford and manage the meds that he is taking at present. He lives with supportive spouse and is fairly independent. He denies any issues with transportation. Patient has PMH of arthriits, A-fib, HLD, HTN, hypothyroidism, legal blindess to left eye and pre-diabetic. He denies needing any assistance managing his chronic conditions. RN CM reviewed and discussed [facility] services with patient. Patient appreciative to know services available to assist him but does not feel like he needs any of the services at this time. Advised patient that RN CM had sent out [facility] brochure and contact info via mail for future reference. Patient mae aware that he can call [facility] at any time for any future needs or concerns. He voiced understanding and was appreciative of call.     Plan: RN CM will close case at this time as no further interventions needed.   [name], RN,BSN,CCM [place] [facility] Telephonic Care Management Coordinator Direct Phone: [phone] Toll Free: [phone] Fax: [phone]

## 2017-12-27 DIAGNOSIS — I610 Nontraumatic intracerebral hemorrhage in hemisphere, subcortical: Secondary | ICD-10-CM | POA: Diagnosis not present

## 2017-12-27 DIAGNOSIS — Z6841 Body Mass Index (BMI) 40.0 and over, adult: Secondary | ICD-10-CM | POA: Diagnosis not present

## 2018-01-07 ENCOUNTER — Other Ambulatory Visit: Payer: Self-pay | Admitting: Family Medicine

## 2018-01-14 ENCOUNTER — Other Ambulatory Visit: Payer: Self-pay | Admitting: Family Medicine

## 2018-01-22 ENCOUNTER — Encounter: Payer: Self-pay | Admitting: Family Medicine

## 2018-01-22 ENCOUNTER — Emergency Department (HOSPITAL_COMMUNITY): Payer: PPO

## 2018-01-22 ENCOUNTER — Ambulatory Visit (INDEPENDENT_AMBULATORY_CARE_PROVIDER_SITE_OTHER): Payer: PPO | Admitting: Family Medicine

## 2018-01-22 ENCOUNTER — Encounter (HOSPITAL_COMMUNITY): Payer: Self-pay | Admitting: Emergency Medicine

## 2018-01-22 ENCOUNTER — Inpatient Hospital Stay (HOSPITAL_COMMUNITY)
Admission: EM | Admit: 2018-01-22 | Discharge: 2018-01-29 | DRG: 054 | Disposition: A | Discharge status: Home / Self Care | Payer: PPO | Attending: Internal Medicine | Admitting: Internal Medicine

## 2018-01-22 ENCOUNTER — Other Ambulatory Visit: Payer: Self-pay

## 2018-01-22 ENCOUNTER — Other Ambulatory Visit (HOSPITAL_COMMUNITY): Payer: Self-pay | Admitting: Neurosurgery

## 2018-01-22 VITALS — BP 126/76 | HR 62 | Temp 97.1°F | Ht 69.0 in | Wt 275.0 lb

## 2018-01-22 DIAGNOSIS — I10 Essential (primary) hypertension: Secondary | ICD-10-CM | POA: Diagnosis present

## 2018-01-22 DIAGNOSIS — G4459 Other complicated headache syndrome: Secondary | ICD-10-CM

## 2018-01-22 DIAGNOSIS — H548 Legal blindness, as defined in USA: Secondary | ICD-10-CM | POA: Diagnosis present

## 2018-01-22 DIAGNOSIS — I639 Cerebral infarction, unspecified: Secondary | ICD-10-CM | POA: Diagnosis not present

## 2018-01-22 DIAGNOSIS — Z9842 Cataract extraction status, left eye: Secondary | ICD-10-CM | POA: Diagnosis not present

## 2018-01-22 DIAGNOSIS — R7303 Prediabetes: Secondary | ICD-10-CM | POA: Diagnosis present

## 2018-01-22 DIAGNOSIS — G939 Disorder of brain, unspecified: Secondary | ICD-10-CM | POA: Diagnosis not present

## 2018-01-22 DIAGNOSIS — G9389 Other specified disorders of brain: Secondary | ICD-10-CM | POA: Diagnosis present

## 2018-01-22 DIAGNOSIS — K760 Fatty (change of) liver, not elsewhere classified: Secondary | ICD-10-CM | POA: Diagnosis not present

## 2018-01-22 DIAGNOSIS — Z8679 Personal history of other diseases of the circulatory system: Secondary | ICD-10-CM

## 2018-01-22 DIAGNOSIS — I680 Cerebral amyloid angiopathy: Secondary | ICD-10-CM | POA: Diagnosis present

## 2018-01-22 DIAGNOSIS — Z9049 Acquired absence of other specified parts of digestive tract: Secondary | ICD-10-CM

## 2018-01-22 DIAGNOSIS — I480 Paroxysmal atrial fibrillation: Secondary | ICD-10-CM | POA: Diagnosis present

## 2018-01-22 DIAGNOSIS — I4891 Unspecified atrial fibrillation: Secondary | ICD-10-CM | POA: Diagnosis not present

## 2018-01-22 DIAGNOSIS — I619 Nontraumatic intracerebral hemorrhage, unspecified: Secondary | ICD-10-CM | POA: Diagnosis present

## 2018-01-22 DIAGNOSIS — Z8261 Family history of arthritis: Secondary | ICD-10-CM

## 2018-01-22 DIAGNOSIS — Z9889 Other specified postprocedural states: Secondary | ICD-10-CM

## 2018-01-22 DIAGNOSIS — C7972 Secondary malignant neoplasm of left adrenal gland: Secondary | ICD-10-CM | POA: Diagnosis not present

## 2018-01-22 DIAGNOSIS — E785 Hyperlipidemia, unspecified: Secondary | ICD-10-CM | POA: Diagnosis present

## 2018-01-22 DIAGNOSIS — Z961 Presence of intraocular lens: Secondary | ICD-10-CM | POA: Diagnosis present

## 2018-01-22 DIAGNOSIS — I610 Nontraumatic intracerebral hemorrhage in hemisphere, subcortical: Secondary | ICD-10-CM

## 2018-01-22 DIAGNOSIS — R519 Headache, unspecified: Secondary | ICD-10-CM | POA: Diagnosis present

## 2018-01-22 DIAGNOSIS — Z6841 Body Mass Index (BMI) 40.0 and over, adult: Secondary | ICD-10-CM | POA: Diagnosis not present

## 2018-01-22 DIAGNOSIS — I609 Nontraumatic subarachnoid hemorrhage, unspecified: Secondary | ICD-10-CM | POA: Diagnosis present

## 2018-01-22 DIAGNOSIS — Z9841 Cataract extraction status, right eye: Secondary | ICD-10-CM | POA: Diagnosis not present

## 2018-01-22 DIAGNOSIS — E279 Disorder of adrenal gland, unspecified: Secondary | ICD-10-CM

## 2018-01-22 DIAGNOSIS — G935 Compression of brain: Secondary | ICD-10-CM | POA: Diagnosis present

## 2018-01-22 DIAGNOSIS — Z821 Family history of blindness and visual loss: Secondary | ICD-10-CM

## 2018-01-22 DIAGNOSIS — C7931 Secondary malignant neoplasm of brain: Principal | ICD-10-CM | POA: Diagnosis present

## 2018-01-22 DIAGNOSIS — C439 Malignant melanoma of skin, unspecified: Secondary | ICD-10-CM | POA: Diagnosis not present

## 2018-01-22 DIAGNOSIS — G936 Cerebral edema: Secondary | ICD-10-CM | POA: Diagnosis present

## 2018-01-22 DIAGNOSIS — Z825 Family history of asthma and other chronic lower respiratory diseases: Secondary | ICD-10-CM

## 2018-01-22 DIAGNOSIS — K869 Disease of pancreas, unspecified: Secondary | ICD-10-CM | POA: Diagnosis not present

## 2018-01-22 DIAGNOSIS — I615 Nontraumatic intracerebral hemorrhage, intraventricular: Secondary | ICD-10-CM | POA: Diagnosis present

## 2018-01-22 DIAGNOSIS — Z8249 Family history of ischemic heart disease and other diseases of the circulatory system: Secondary | ICD-10-CM

## 2018-01-22 DIAGNOSIS — E039 Hypothyroidism, unspecified: Secondary | ICD-10-CM | POA: Diagnosis present

## 2018-01-22 DIAGNOSIS — E278 Other specified disorders of adrenal gland: Secondary | ICD-10-CM

## 2018-01-22 DIAGNOSIS — L989 Disorder of the skin and subcutaneous tissue, unspecified: Secondary | ICD-10-CM | POA: Diagnosis present

## 2018-01-22 DIAGNOSIS — I959 Hypotension, unspecified: Secondary | ICD-10-CM | POA: Diagnosis not present

## 2018-01-22 DIAGNOSIS — R918 Other nonspecific abnormal finding of lung field: Secondary | ICD-10-CM | POA: Diagnosis not present

## 2018-01-22 DIAGNOSIS — Z96653 Presence of artificial knee joint, bilateral: Secondary | ICD-10-CM | POA: Diagnosis present

## 2018-01-22 DIAGNOSIS — Z7984 Long term (current) use of oral hypoglycemic drugs: Secondary | ICD-10-CM

## 2018-01-22 DIAGNOSIS — K862 Cyst of pancreas: Secondary | ICD-10-CM | POA: Diagnosis not present

## 2018-01-22 DIAGNOSIS — I629 Nontraumatic intracranial hemorrhage, unspecified: Secondary | ICD-10-CM | POA: Diagnosis present

## 2018-01-22 DIAGNOSIS — R29700 NIHSS score 0: Secondary | ICD-10-CM | POA: Diagnosis present

## 2018-01-22 DIAGNOSIS — Z79890 Hormone replacement therapy (postmenopausal): Secondary | ICD-10-CM

## 2018-01-22 DIAGNOSIS — R51 Headache: Secondary | ICD-10-CM | POA: Diagnosis not present

## 2018-01-22 DIAGNOSIS — M1990 Unspecified osteoarthritis, unspecified site: Secondary | ICD-10-CM | POA: Diagnosis present

## 2018-01-22 NOTE — Progress Notes (Signed)
BP 126/76   Pulse 62   Temp (!) 97.1 F (36.2 C) (Oral)   Ht 5\' 9"  (1.753 m)   Wt 275 lb (124.7 kg)   BMI 40.61 kg/m    Subjective:    Patient ID: [name] [name], male    DOB: 06-28-1940, 77 y.o.   MRN: [id number]  HPI: [name] [name] is a 77 y.o. male presenting on 01/22/2018 for Headache   HPI Headache Patient is coming in for headache that is severe and he describes as a pulsating and pressure on the top of his head.  He says when he coughs or sneezes the pressure on top of his head increases.  Patient has a history of 2 intracranial bleeds over the past 2 years both were related to trauma.  He says when this started up a couple weeks ago he does not think he had any new trauma to his head but it has been gradually increasing in frequency and severity over the past 2 weeks.  Today he says he had a headache all day and it has not let up despite using Tylenol for it which he has used previously.  He denies any visual changes or changes in taste or smell.  He denies any numbness or weakness on one side of his body or the other.  He says the pain is severe enough that over the past couple days it has woken him up and he rates it as an 8 out of 10.  He said earlier in the week he did try and call his neurologist Dr. Arnoldo Morale about this but was unable to get through to them.  Relevant past medical, surgical, family and social history reviewed and updated as indicated. Interim medical history since our last visit reviewed. Allergies and medications reviewed and updated.  Review of Systems  Constitutional: Negative for chills and fever.  Eyes: Negative for photophobia, pain and visual disturbance.  Respiratory: Negative for shortness of breath and wheezing.   Cardiovascular: Negative for chest pain and leg swelling.  Musculoskeletal: Negative for back pain and gait problem.  Skin: Negative for rash.  Neurological: Positive for headaches. Negative for dizziness, speech difficulty, weakness  and numbness.  Psychiatric/Behavioral: Negative for behavioral problems.  All other systems reviewed and are negative.   Per HPI unless specifically indicated above   Allergies as of 01/22/2018   No Known Allergies     Medication List        Accurate as of 01/22/18  6:21 PM. Always use your most recent med list.          acetaminophen 500 MG tablet Commonly known as:  TYLENOL Take 500-1,000 mg by mouth every 6 (six) hours as needed for mild pain or headache.   albuterol 108 (90 Base) MCG/ACT inhaler Commonly known as:  PROVENTIL HFA;VENTOLIN HFA Inhale 2 puffs into the lungs every 6 (six) hours as needed for wheezing or shortness of breath.   atorvastatin 10 MG tablet Commonly known as:  LIPITOR Take 1 tablet (10 mg total) by mouth daily.   diltiazem 360 MG 24 hr capsule Commonly known as:  CARDIZEM CD TAKE 1 CAPSULE BY MOUTH EVERY DAY   flecainide 100 MG tablet Commonly known as:  TAMBOCOR TAKE 1 TABLET (100 MG TOTAL) BY MOUTH 2 (TWO) TIMES DAILY.   levothyroxine 50 MCG tablet Commonly known as:  SYNTHROID, LEVOTHROID TAKE 2 TABLETS BY MOUTH DAILY   lisinopril 20 MG tablet Commonly known as:  PRINIVIL,ZESTRIL TAKE 1  TABLET (20 MG TOTAL) BY MOUTH DAILY.   metFORMIN 500 MG 24 hr tablet Commonly known as:  GLUCOPHAGE-XR Take 1 tablet (500 mg total) by mouth daily with breakfast. (Need to be seen)          Objective:    BP 126/76   Pulse 62   Temp (!) 97.1 F (36.2 C) (Oral)   Ht 5\' 9"  (1.753 m)   Wt 275 lb (124.7 kg)   BMI 40.61 kg/m   Wt Readings from Last 3 Encounters:  01/22/18 275 lb (124.7 kg)  12/10/17 284 lb 3.2 oz (128.9 kg)  12/02/17 290 lb (131.5 kg)    Physical Exam  Constitutional: He is oriented to person, place, and time. He appears well-developed and well-nourished. No distress.  Eyes: Pupils are equal, round, and reactive to light. Conjunctivae and EOM are normal. Right eye exhibits no discharge. No scleral icterus.  Neck: Neck  supple. No thyromegaly present.  Cardiovascular: Normal rate, regular rhythm, normal heart sounds and intact distal pulses.  No murmur heard. Pulmonary/Chest: Effort normal and breath sounds normal. No respiratory distress. He has no wheezes.  Musculoskeletal: Normal range of motion. He exhibits no edema.  Lymphadenopathy:    He has no cervical adenopathy.  Neurological: He is alert and oriented to person, place, and time. He is not disoriented. He displays normal reflexes. No cranial nerve deficit or sensory deficit. Coordination and gait normal.  Skin: Skin is warm and dry. No rash noted. He is not diaphoretic.  Psychiatric: He has a normal mood and affect. His behavior is normal.  Nursing note and vitals reviewed.       Assessment & Plan:   Problem List Items Addressed This Visit      Other   Headache - Primary      Patient has a complicated headache syndrome because of history of intracranial bleeding twice in the past couple years.  He has been having worsening headaches over the past couple weeks where it is more severe each day and today is been more severe and constant today.  It is after hours and we cannot get a CT head outpatient currently and I recommended that he go to the emergency department.  Patient does not want to go in because tomorrow is Thanksgiving so they are going to hold off and watch for focal neurological symptoms or worsening headaches, if he has not gotten one by Friday and he will call here and we will order a CT had on Friday. Follow up plan: Return if symptoms worsen or fail to improve.  Counseling provided for all of the vaccine components No orders of the defined types were placed in this encounter.   Caryl Pina, MD [facility] 01/22/2018, 6:21 PM
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
## 2018-01-22 NOTE — ED Triage Notes (Signed)
Pt states he has been having headaches x 2 weeks. Pt states he has seen pcp for the same.

## 2018-01-23 ENCOUNTER — Inpatient Hospital Stay (HOSPITAL_COMMUNITY): Payer: PPO

## 2018-01-23 ENCOUNTER — Encounter (HOSPITAL_COMMUNITY): Payer: Self-pay | Admitting: Family Medicine

## 2018-01-23 DIAGNOSIS — I610 Nontraumatic intracerebral hemorrhage in hemisphere, subcortical: Secondary | ICD-10-CM

## 2018-01-23 DIAGNOSIS — L989 Disorder of the skin and subcutaneous tissue, unspecified: Secondary | ICD-10-CM | POA: Diagnosis present

## 2018-01-23 DIAGNOSIS — H548 Legal blindness, as defined in USA: Secondary | ICD-10-CM | POA: Diagnosis present

## 2018-01-23 DIAGNOSIS — I619 Nontraumatic intracerebral hemorrhage, unspecified: Secondary | ICD-10-CM | POA: Diagnosis not present

## 2018-01-23 DIAGNOSIS — E039 Hypothyroidism, unspecified: Secondary | ICD-10-CM

## 2018-01-23 DIAGNOSIS — Z8261 Family history of arthritis: Secondary | ICD-10-CM | POA: Diagnosis not present

## 2018-01-23 DIAGNOSIS — I629 Nontraumatic intracranial hemorrhage, unspecified: Secondary | ICD-10-CM

## 2018-01-23 DIAGNOSIS — R7303 Prediabetes: Secondary | ICD-10-CM | POA: Diagnosis present

## 2018-01-23 DIAGNOSIS — Z9842 Cataract extraction status, left eye: Secondary | ICD-10-CM | POA: Diagnosis not present

## 2018-01-23 DIAGNOSIS — R29700 NIHSS score 0: Secondary | ICD-10-CM | POA: Diagnosis present

## 2018-01-23 DIAGNOSIS — E785 Hyperlipidemia, unspecified: Secondary | ICD-10-CM | POA: Diagnosis present

## 2018-01-23 DIAGNOSIS — Z6841 Body Mass Index (BMI) 40.0 and over, adult: Secondary | ICD-10-CM | POA: Diagnosis not present

## 2018-01-23 DIAGNOSIS — G9389 Other specified disorders of brain: Secondary | ICD-10-CM | POA: Diagnosis present

## 2018-01-23 DIAGNOSIS — G935 Compression of brain: Secondary | ICD-10-CM | POA: Diagnosis present

## 2018-01-23 DIAGNOSIS — I680 Cerebral amyloid angiopathy: Secondary | ICD-10-CM | POA: Diagnosis present

## 2018-01-23 DIAGNOSIS — Z8679 Personal history of other diseases of the circulatory system: Secondary | ICD-10-CM | POA: Diagnosis not present

## 2018-01-23 DIAGNOSIS — Z961 Presence of intraocular lens: Secondary | ICD-10-CM | POA: Diagnosis present

## 2018-01-23 DIAGNOSIS — G936 Cerebral edema: Secondary | ICD-10-CM | POA: Diagnosis present

## 2018-01-23 DIAGNOSIS — Z8249 Family history of ischemic heart disease and other diseases of the circulatory system: Secondary | ICD-10-CM | POA: Diagnosis not present

## 2018-01-23 DIAGNOSIS — C7931 Secondary malignant neoplasm of brain: Secondary | ICD-10-CM | POA: Diagnosis present

## 2018-01-23 DIAGNOSIS — I480 Paroxysmal atrial fibrillation: Secondary | ICD-10-CM | POA: Diagnosis present

## 2018-01-23 DIAGNOSIS — I10 Essential (primary) hypertension: Secondary | ICD-10-CM | POA: Diagnosis present

## 2018-01-23 DIAGNOSIS — I615 Nontraumatic intracerebral hemorrhage, intraventricular: Secondary | ICD-10-CM | POA: Diagnosis present

## 2018-01-23 DIAGNOSIS — Z825 Family history of asthma and other chronic lower respiratory diseases: Secondary | ICD-10-CM | POA: Diagnosis not present

## 2018-01-23 DIAGNOSIS — Z9841 Cataract extraction status, right eye: Secondary | ICD-10-CM | POA: Diagnosis not present

## 2018-01-23 DIAGNOSIS — Z96653 Presence of artificial knee joint, bilateral: Secondary | ICD-10-CM | POA: Diagnosis present

## 2018-01-23 DIAGNOSIS — E279 Disorder of adrenal gland, unspecified: Secondary | ICD-10-CM | POA: Diagnosis not present

## 2018-01-23 DIAGNOSIS — Z821 Family history of blindness and visual loss: Secondary | ICD-10-CM | POA: Diagnosis not present

## 2018-01-23 DIAGNOSIS — R51 Headache: Secondary | ICD-10-CM | POA: Diagnosis not present

## 2018-01-23 DIAGNOSIS — I609 Nontraumatic subarachnoid hemorrhage, unspecified: Secondary | ICD-10-CM | POA: Diagnosis present

## 2018-01-23 LAB — URINALYSIS, ROUTINE W REFLEX MICROSCOPIC
Bilirubin Urine: NEGATIVE
Glucose, UA: NEGATIVE mg/dL
HGB URINE DIPSTICK: NEGATIVE
Ketones, ur: NEGATIVE mg/dL
Leukocytes, UA: NEGATIVE
Nitrite: NEGATIVE
Protein, ur: NEGATIVE mg/dL
Specific Gravity, Urine: 1.018 (ref 1.005–1.030)
pH: 6 (ref 5.0–8.0)

## 2018-01-23 LAB — COMPREHENSIVE METABOLIC PANEL
ALBUMIN: 4.3 g/dL (ref 3.5–5.0)
ALK PHOS: 66 U/L (ref 38–126)
ALT: 36 U/L (ref 0–44)
ANION GAP: 11 (ref 5–15)
AST: 27 U/L (ref 15–41)
BILIRUBIN TOTAL: 0.9 mg/dL (ref 0.3–1.2)
BUN: 13 mg/dL (ref 8–23)
CO2: 23 mmol/L (ref 22–32)
Calcium: 9.1 mg/dL (ref 8.9–10.3)
Chloride: 101 mmol/L (ref 98–111)
Creatinine, Ser: 0.97 mg/dL (ref 0.61–1.24)
GFR calc non Af Amer: >60 mL/min (ref >60)
GLUCOSE: 110 mg/dL — AB (ref 70–99)
Potassium: 3.9 mmol/L (ref 3.5–5.1)
Sodium: 135 mmol/L (ref 135–145)
Total Protein: 7.7 g/dL (ref 6.5–8.1)

## 2018-01-23 LAB — CBC WITH DIFFERENTIAL/PLATELET
Abs Immature Granulocytes: 0.03 10*3/uL (ref 0.00–0.07)
Basophils Absolute: 0.1 10*3/uL (ref 0.0–0.1)
Basophils Relative: 1 %
EOS ABS: 0.1 10*3/uL (ref 0.0–0.5)
EOS PCT: 1 %
HEMATOCRIT: 44.3 % (ref 39.0–52.0)
Hemoglobin: 14.4 g/dL (ref 13.0–17.0)
Immature Granulocytes: 0 %
Lymphocytes Relative: 24 %
Lymphs Abs: 2.7 10*3/uL (ref 0.7–4.0)
MCH: 28.9 pg (ref 26.0–34.0)
MCHC: 32.5 g/dL (ref 30.0–36.0)
MCV: 89 fL (ref 80.0–100.0)
MONOS PCT: 10 %
Monocytes Absolute: 1.2 10*3/uL — ABNORMAL HIGH (ref 0.1–1.0)
NRBC: 0 % (ref 0.0–0.2)
Neutro Abs: 7.3 10*3/uL (ref 1.7–7.7)
Neutrophils Relative %: 64 %
Platelets: 242 10*3/uL (ref 150–400)
RBC: 4.98 MIL/uL (ref 4.22–5.81)
RDW: 13.3 % (ref 11.5–15.5)
WBC: 11.3 10*3/uL — ABNORMAL HIGH (ref 4.0–10.5)

## 2018-01-23 LAB — MRSA PCR SCREENING: MRSA by PCR: NEGATIVE

## 2018-01-23 LAB — PROTIME-INR
INR: 1
Prothrombin Time: 13.1 seconds (ref 11.4–15.2)

## 2018-01-23 LAB — APTT: aPTT: 28 seconds (ref 24–36)

## 2018-01-23 MED ORDER — LABETALOL HCL 5 MG/ML IV SOLN: 5.0000 mg | INTRAVENOUS | Status: DC | PRN

## 2018-01-23 MED ORDER — ONDANSETRON HCL 4 MG PO TABS: 4.0000 mg | ORAL_TABLET | Freq: Four times a day (QID) | ORAL | Status: DC | PRN

## 2018-01-23 MED ORDER — ATORVASTATIN CALCIUM 10 MG PO TABS
10.0000 mg | ORAL_TABLET | Freq: Every day | ORAL | Status: DC
  Administered 2018-01-23 – 2018-01-29 (×7): 10 mg via ORAL
  Filled 2018-01-23 (×7): qty 1

## 2018-01-23 MED ORDER — LEVOTHYROXINE SODIUM 100 MCG PO TABS
100.0000 ug | ORAL_TABLET | Freq: Every day | ORAL | Status: DC
  Administered 2018-01-24 – 2018-01-29 (×6): 100 ug via ORAL
  Filled 2018-01-23 (×6): qty 1

## 2018-01-23 MED ORDER — ACETAMINOPHEN 325 MG PO TABS
650.0000 mg | ORAL_TABLET | Freq: Four times a day (QID) | ORAL | Status: DC | PRN
  Administered 2018-01-23 – 2018-01-24 (×2): 650 mg via ORAL
  Filled 2018-01-23 (×2): qty 2

## 2018-01-23 MED ORDER — SODIUM CHLORIDE 0.9% FLUSH: 3.0000 mL | INTRAVENOUS | Status: DC | PRN

## 2018-01-23 MED ORDER — DILTIAZEM HCL ER COATED BEADS 360 MG PO CP24
360.0000 mg | ORAL_CAPSULE | Freq: Every day | ORAL | Status: DC
  Administered 2018-01-23 – 2018-01-26 (×4): 360 mg via ORAL
  Filled 2018-01-23: qty 1
  Filled 2018-01-23: qty 2
  Filled 2018-01-23 (×2): qty 1
  Filled 2018-01-23: qty 2
  Filled 2018-01-23 (×2): qty 1
  Filled 2018-01-23: qty 2

## 2018-01-23 MED ORDER — SODIUM CHLORIDE 0.9% FLUSH
3.0000 mL | Freq: Two times a day (BID) | INTRAVENOUS | Status: DC
  Administered 2018-01-24 – 2018-01-25 (×3): 3 mL via INTRAVENOUS
  Administered 2018-01-27: 10 mL via INTRAVENOUS
  Administered 2018-01-28 – 2018-01-29 (×2): 3 mL via INTRAVENOUS

## 2018-01-23 MED ORDER — LISINOPRIL 20 MG PO TABS
20.0000 mg | ORAL_TABLET | Freq: Every day | ORAL | Status: DC
  Administered 2018-01-23 – 2018-01-26 (×4): 20 mg via ORAL
  Filled 2018-01-23 (×4): qty 1

## 2018-01-23 MED ORDER — SODIUM CHLORIDE 0.9% FLUSH
3.0000 mL | Freq: Two times a day (BID) | INTRAVENOUS | Status: DC
  Administered 2018-01-23 – 2018-01-29 (×13): 3 mL via INTRAVENOUS

## 2018-01-23 MED ORDER — TOPIRAMATE 25 MG PO TABS
50.0000 mg | ORAL_TABLET | Freq: Two times a day (BID) | ORAL | Status: DC
  Administered 2018-01-23 – 2018-01-26 (×8): 50 mg via ORAL
  Filled 2018-01-23 (×8): qty 2

## 2018-01-23 MED ORDER — SODIUM CHLORIDE 0.9 % IV SOLN: 250.0000 mL | INTRAVENOUS | Status: DC | PRN

## 2018-01-23 MED ORDER — ONDANSETRON HCL 4 MG/2ML IJ SOLN: 4.0000 mg | Freq: Four times a day (QID) | INTRAMUSCULAR | Status: DC | PRN

## 2018-01-23 MED ORDER — FLECAINIDE ACETATE 100 MG PO TABS
100.0000 mg | ORAL_TABLET | Freq: Two times a day (BID) | ORAL | Status: DC
  Administered 2018-01-23 – 2018-01-29 (×12): 100 mg via ORAL
  Filled 2018-01-23 (×12): qty 1

## 2018-01-23 MED ORDER — GADOBUTROL 1 MMOL/ML IV SOLN
10.0000 mL | Freq: Once | INTRAVENOUS | Status: AC | PRN
  Administered 2018-01-23: 10 mL via INTRAVENOUS

## 2018-01-23 MED ORDER — SENNOSIDES-DOCUSATE SODIUM 8.6-50 MG PO TABS
1.0000 | ORAL_TABLET | Freq: Two times a day (BID) | ORAL | Status: DC
  Administered 2018-01-23 – 2018-01-29 (×12): 1 via ORAL
  Filled 2018-01-23 (×14): qty 1

## 2018-01-23 NOTE — ED Provider Notes (Addendum)
  Physical Exam  BP (!) 153/86   Pulse 76   Temp 97.8 F (36.6 C)   Resp 18   Ht 5\' 9"  (1.753 m)   Wt 124.7 kg   SpO2 99%   BMI 40.61 kg/m   Physical Exam  ED Course/Procedures     Procedures  MDM  Patient arrives to [facility] from [facility] after the CT scan showed that he had intraparenchymal and intraventricular bleed.  At the moment patient is headache free and denies any new numbness, tingling, weakness.  He appears comfortable and is nontoxic.  The pressure is in the 295J systolic.  I spoke with Dr. Venetia Constable, neurosurgery. He has reviewed the images and requested that patient be admitted by neurologist, and they will follow-up with the patient.    Varney Biles, MD 01/23/18 0344  4:44 AM Spoke with Dr. Lorraine Lax, Neurology.  Given that patient's blood pressure is stable, his headaches has been going on for several days and he does not have any focal neurologic deficits   -he thinks that the patient can be admitted by medicine team to stepdown ICU and they will follow-up with the patient.  Dr. Lorraine Lax has reviewed the CT scan before giving [place] a recommendation.  6:23 AM Pt aware of the disposition. He continues to be asymptomatic.    CRITICAL CARE Performed by: Chany Woolworth   Total critical care time: 31 minutes  Critical care time was exclusive of separately billable procedures and treating other patients.  Critical care was necessary to treat or prevent imminent or life-threatening deterioration.  Critical care was time spent personally by me on the following activities: development of treatment plan with patient and/or surrogate as well as nursing, discussions with consultants, evaluation of patient's response to treatment, examination of patient, obtaining history from patient or surrogate, ordering and performing treatments and interventions, ordering and review of laboratory studies, ordering and review of radiographic studies, pulse oximetry and  re-evaluation of patient's condition.       Varney Biles, MD 01/23/18 [phone]

## 2018-01-23 NOTE — ED Notes (Signed)
ORDERED BREAKFAST AND LUNCH TRAY

## 2018-01-23 NOTE — H&P (Signed)
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  Repeat CT showed enlargement of prior hemorrhage.  Seen by neurology in the emergency department with recommendation for admission to stepdown unit, no need for ICU, check MRI brain with and without contrast, BP goal less than 140/90.  Patient reports 2 weeks of headache, gradually worsening, becoming intense.  Combination of sharp, aching, throbbing.  Worse with cough, better with Tylenol.  Located on the top of the head.  Denies trauma.  No NSAID use.  Not on any anticoagulation.  ED Course: As above  Review of Systems:  Negative for fever, visual changes, sore throat, rash, new muscle aches, chest pain, SOB, dysuria, bleeding, n/abdominal pain.  Positive for 1 episode of vomiting.  Past Medical History:  Diagnosis Date  . Arthritis   . Atrial fibrillation ([place])    previously on Coumadin, reversed with head bleed  . Cataract   . Hyperlipidemia   . HYPERTENSION   . HYPOTHYROIDISM   . 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 Pre-diabetes   . Precancerous skin lesion    on back  . ROTATOR CUFF TEAR   . Subdural hematoma [facility])     Past Surgical History:  Procedure Laterality Date  . APPENDECTOMY    . CATARACT EXTRACTION W/PHACO Right 02/02/2014   Procedure: CATARACT EXTRACTION PHACO AND INTRAOCULAR LENS PLACEMENT (IOC);  Surgeon: Elta Guadeloupe T. Gershon Crane, MD;  Location: [facility];  Service: Ophthalmology;  Laterality: Right;  CDE 12.17  . CATARACT EXTRACTION W/PHACO Left 02/16/2014   Procedure: CATARACT  EXTRACTION PHACO AND INTRAOCULAR LENS PLACEMENT ;  Surgeon: Elta Guadeloupe T. Gershon Crane, MD;  Location: [facility];  Service: Ophthalmology;  Laterality: Left;  CDE:13.42  . CRANIOTOMY N/A 02/05/2017   Procedure: Bilateral Burr Holes . Craniotomy for Subdural;  Surgeon: Ditty, Kevan Ny, MD;  Location: [place];  Service: Neurosurgery;  Laterality: N/A;  . EYE SURGERY  2015   cataract surgery. bilateral  . JOINT REPLACEMENT  2009   bilateral knee replacement  . KNEE SURGERY    . ROTATOR CUFF REPAIR Left   . SHOULDER SURGERY    . TOTAL KNEE ARTHROPLASTY Bilateral      reports that he has never smoked. He has never used smokeless tobacco. He reports that he does not drink alcohol or use drugs. Mobility: Ambulatory  No Known Allergies  Family History  Problem Relation Age of Onset  . Heart attack Brother   . Early death Brother   . Asthma Father   . Vision loss Father   . Hypertension Mother   . Heart attack Brother 67  . Arthritis Brother      Prior to Admission medications   Medication Sig Start Date End Date Taking? Authorizing Provider  albuterol (PROVENTIL HFA;VENTOLIN HFA) 108 (90 Base) MCG/ACT inhaler Inhale 2 puffs into the lungs every 6 (six) hours as needed for wheezing or shortness of breath. 06/29/16  Yes Eustaquio Maize, MD  atorvastatin (LIPITOR) 10 MG tablet Take 1 tablet (10 mg total) by mouth daily. 12/17/17  Yes Chipper Herb, MD  diltiazem (CARDIZEM CD) 360 MG 24  hr capsule TAKE 1 CAPSULE BY MOUTH EVERY DAY Patient taking differently: Take 360 mg by mouth daily.  01/07/18  Yes Dettinger, [name], MD  flecainide (TAMBOCOR) 100 MG tablet TAKE 1 TABLET (100 MG TOTAL) BY MOUTH 2 (TWO) TIMES DAILY. Patient taking differently: Take 100 mg by mouth 2 (two) times daily.  08/23/17  Yes Timmothy Euler, MD  levothyroxine (SYNTHROID, LEVOTHROID) 50 MCG tablet TAKE 2 TABLETS BY MOUTH DAILY Patient taking differently: Take 100 mcg by mouth daily before breakfast.  07/29/17  Yes  Timmothy Euler, MD  lisinopril (PRINIVIL,ZESTRIL) 20 MG tablet TAKE 1 TABLET (20 MG TOTAL) BY MOUTH DAILY. 08/23/17  Yes Timmothy Euler, MD  metFORMIN (GLUCOPHAGE-XR) 500 MG 24 hr tablet Take 1 tablet (500 mg total) by mouth daily with breakfast. (Need to be seen) 01/14/18  Yes Dettinger, [name], MD    Physical Exam: Vitals:   01/23/18 0730 01/23/18 0745  BP: (!) 153/95 120/79  Pulse: 82 70  Resp: 13 15  Temp:    SpO2: 96% 96%    Constitutional:   . Appears calm and comfortable Eyes:  . pupils and irises appear normal . Normal lids ENMT:  . grossly normal hearing  . Lips appear normal . Oropharynx: tongue appears normal Neck:  . neck appears normal, no masses . no thyromegaly Respiratory:  . CTA bilaterally, no w/r/r.  . Respiratory effort normal.  Cardiovascular:  . RRR, no m/r/g . No LE extremity edema   Abdomen:  . Abdomen appears normal; no tenderness or masses . No hernias noted . No hepatomegaly Musculoskeletal:  . RUE, LUE, RLE, LLE   o strength and tone normal, no atrophy, no abnormal movements o No tenderness, masses Skin:  . No rashes, lesions, ulcers.  Numerous seborrheic keratoses noted. . palpation of skin: no induration or nodules Neurologic:  . CN 2-12 intact . No pronator drift.  No upper extremity dysdiadochokinesis. Psychiatric:  . Mental status o Mood, affect appropriate . judgment and insight appear intact    I have personally reviewed following labs and imaging studies  Labs:   CMP unremarkable, CBC unremarkable.  INR, PTT unremarkable.  Imaging studies:   CT head noted.  Medical tests:      Principal Problem:   ICH (intracerebral hemorrhage) (HCC) Active Problems:   Hypothyroidism   Essential hypertension   Paroxysmal atrial fibrillation (HCC)   Headache   Assessment/Plan Multifocal intracerebral hemorrhage with surrounding vasogenic cerebral edema and brain compression.  Etiology unclear. --Plan as per  neurology and neurosurgery: Admit to stepdown, check MRI with and without contrast to evaluate for amyloid angiopathy --Check CT chest abdomen and pelvis to rule out obvious metastatic disease in AM --Neurochecks --Currently no acute neurosurgical intervention indicated per neurosurgery --Blood pressure goal <140/90 --Consider hematology evaluation in the near future  Atrial fibrillation --Heart rate stable.  Continue Cardizem.  No anticoagulation as above.  Essential hypertension --Resume Cardizem, lisinopril.  Hydralazine as needed.  Hypothyroidism --Continue levothyroxine.  Hyperlipidemia --Continue statin.  Prediabetes.  Hold metformin.  Congenital blindness left eye  Severity of Illness: The appropriate patient status for this patient is INPATIENT. Inpatient status is judged to be reasonable and necessary in order to provide the required intensity of service to ensure the patient's safety. The patient's presenting symptoms, physical exam findings, and initial radiographic and laboratory data in the context of their chronic comorbidities is felt to place them at high risk for further clinical deterioration. Furthermore, it is not anticipated  that the patient will be medically stable for discharge from the hospital within 2 midnights of admission. The following factors support the patient status of inpatient.   " The patient's presenting symptoms include headache. " " The initial radiographic and laboratory data are worrisome because of intracerebral hemorrhage with brain cerebral edema and mass-effect.  Patient requires frequent neuro checks and close monitoring. " The chronic co-morbidities include hypertension.  * I certify that at the point of admission it is my clinical judgment that the patient will require inpatient hospital care spanning beyond 2 midnights from the point of admission due to high intensity of service, high risk for further deterioration and high frequency of  surveillance required.*   DVT prophylaxis: SCDs Code Status: Full Family Communication: none Consults called: neurology, neurosurgery    Time spent: 60 minutes  Murray Hodgkins, MD  [facility] Direct contact: [phone] --Via amion app OR  --[email or web address]; password [email or web address]  7PM-7AM contact night coverage as above  01/23/2018, 9:19 AM

## 2018-01-23 NOTE — ED Notes (Signed)
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

## 2018-01-23 NOTE — Progress Notes (Addendum)
STROKE TEAM PROGRESS NOTE   INTERVAL HISTORY No family is at the bedside.  He is doing pretty good other than his HA, no focal deficits. I have reviewed in detail history of presenting illness as well as electronic medical records and prior admission and imaging films  Vitals:   01/23/18 0700 01/23/18 0715 01/23/18 0730 01/23/18 0745  BP: (!) 161/91 (!) 141/93 (!) 153/95 120/79  Pulse: 73 71 82 70  Resp: 15 13 13 15   Temp:      TempSrc:      SpO2: 97% 94% 96% 96%  Weight:      Height:        CBC:  Recent Labs  Lab 01/22/18 2358  WBC 11.3*  NEUTROABS 7.3  HGB 14.4  HCT 44.3  MCV 89.0  PLT 811    Basic Metabolic Panel:  Recent Labs  Lab 01/22/18 2358  NA 135  K 3.9  CL 101  CO2 23  GLUCOSE 110*  BUN 13  CREATININE 0.97  CALCIUM 9.1   Lipid Panel:     Component Value Date/Time   CHOL 116 05/13/2017 1105   TRIG 115 05/13/2017 1105   TRIG 87 07/17/2008   HDL 45 05/13/2017 1105   CHOLHDL 2.6 05/13/2017 1105   LDLCALC 48 05/13/2017 1105   HgbA1c:  Lab Results  Component Value Date   HGBA1C 6.1 (H) 11/25/2017   Urine Drug Screen: No results found for: LABOPIA, COCAINSCRNUR, LABBENZ, AMPHETMU, THCU, LABBARB  Alcohol Level No results found for: ETH  IMAGING Ct Head Wo Contrast  Result Date: 01/23/2018 CLINICAL DATA:  Initial evaluation for acute headache, recent intracranial hemorrhages. EXAM: CT HEAD WITHOUT CONTRAST TECHNIQUE: Contiguous axial images were obtained from the base of the skull through the vertex without intravenous contrast. COMPARISON:  Prior CT from 12/17/2017. FINDINGS: Brain: Since the previous exam, the right frontal intraparenchymal hemorrhage has enlarged in size now measuring 3.5 x 2.4 x 1.9 cm (series 2, image 25). Localized edema within this region overall slightly improved. Adjacent cystic encephalomalacia of now contains a small amount of layering hemorrhage (series 2, image 22). Additional left frontal intraparenchymal hematoma also  increased in size now measuring 3.1 x 2.5 x 2.5 cm (series 2, image 22, previously 1.8 cm). Localized vasogenic edema slightly worsened from previous. Additional right frontotemporal hemorrhage also increased in size now measuring 1.5 x 1.7 x 1.1 cm (series 2, image 17). Localized vasogenic edema has worsened without significant regional mass effect. Additional soft tissue density at the parasagittal aspect of the anterior inferior left frontal lobe measures 1.5 x 1.2 x 1.3 cm with mild localized edema without midline shift. This may have been present on prior CT, although difficult to see on prior exam. Finding also likely reflects a small intraparenchymal hemorrhage. New small volume intraventricular hemorrhage with blood seen layering within the occipital horns of both lateral ventricles, likely via extension from the right frontal ventricular size is relatively stable without interval hydrocephalus. Basilar cisterns remain patent. No other acute intracranial hemorrhage. No subarachnoid or extra-axial hemorrhage appreciated. No acute large vessel territory infarct. Underlying cerebral atrophy, stable. Vascular: No hyperdense vessel. Scattered vascular calcifications noted within the carotid siphons. Skull: Scalp soft tissues demonstrate no acute abnormality. Sequelae of prior bilateral burr hole craniotomy. Sinuses/Orbits: Globes and orbital soft tissues within normal limits. Small bilateral maxillary sinus retention cyst noted. Paranasal sinuses are otherwise clear. Small chronic right mastoid effusion noted. Other: None. IMPRESSION: 1. Interval increase in size of bilateral intraparenchymal hematomas  as above. Localized vasogenic edema without significant midline shift. 2. New small volume intraventricular hemorrhage, likely via extension from the right frontal hematoma and adjacent cystic encephalomalacia. Stable ventricular size without hydrocephalus or ventricular trapping at this time. Critical  Value/emergent results were called by telephone at the time of interpretation on 01/23/2018 at 12:13 am to Dr. Rolland Porter , who verbally acknowledged these results. Electronically Signed   By: Jeannine Boga M.D.   On: 01/23/2018 00:18    PHYSICAL EXAM Pleasant elderly Caucasian male not in distress.  . Afebrile. Head is nontraumatic. Neck is supple without bruit.    Cardiac exam no murmur or gallop. Lungs are clear to auscultation. Distal pulses are well felt. Neurological Exam ;  Awake  Alert oriented x 3. Normal speech and language.eye movements full without nystagmus.fundi were not visualized. Blind os since birth. Hearing is normal. Palatal movements are normal. Face symmetric. Tongue midline. Normal strength, tone, reflexes and coordination. Normal sensation. Gait deferred.  ASSESSMENT/PLAN [name] [name] is a 77 y.o. 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 CT shows hemorrhage enlargement.   Stroke:   Recent B IPH with interval increase in size of hemorrhage with new IVH, no neuro sx. Hmg felt in sep 2019  to be secondary to coagulopathy on warfarin for AF.  Stroke not involved in recent admission.   CT head interval increase B IPH. Vasogenic edema w/o shift. New small IVH.  Stable ventricular size.  Repeat CT head in 24h from initial CT pending   SCDs for VTE prophylaxis  No antithrombotic prior to admission, now on No antithrombotic given hemorrhage  No    Therapy recommendations:  No anticipated needs  Disposition:  Return home ok from stroke standpoint  Will repeat imaging in 1 month to evaluation for possible underlying etiology once hmg is absorbed  Follow up stroke clinic in 1 month. Order  placed.  Headache  Secondary to increased hmg  add topamax 50 bid  Atrial Fibrillation  Home anticoagulation:  none   Home meds: cardizem 360, flecainide 100 bid . Not an AC candidate d/t hmg  Hypertension  Home meds: cardizem 360, lisinopri 20 daily  Stable . SBP goal < 140  Hyperlipidemia  Home meds:  lipitor 10, resumed in hospital  Continue statin at discharge  Other Stroke Risk Factors  Advanced age  Morbid Obesity, Body mass index is 40.61 kg/m., recommend weight loss, diet and exercise as appropriate   Hx stroke/TIA  9-11/2017 - SDH s/p evacuation - presented to [facility] on coumadin for AF, reversed w/ Vit K and Kcentra. Multifocal IPH on chronic SDH. NS consulted.   Other Active Problems  Legally blind OS since birth  Hospital day # 0  Burnetta Sabin, MSN, APRN, ANVP-BC, AGPCNP-BC Advanced Practice Stroke Nurse [place] for Schedule & Pager information 01/23/2018 12:01 PM  I have personally examined this patient, reviewed notes, independently viewed imaging studies, participated in medical decision making and plan of care.ROS completed by me personally and pertinent positives fully documented  I have made any additions or clarifications directly to the above note. Agree with note above.he has presented with 2 weeks of increasing headaches and CT scan shows slight increase in his recent multifocal parenchymal hemorrhages  which were felt to be related to warfarin coagulopathy. Recommend trial of Topamax 50 mg twice daily for headaches for symptomatic management. No surgical intervention. Avoid antiplatelet agents and anticoagulants. Long discussion with the patient and answered questions. Greater than 50% time during this 35 minute visit was spent on counseling and coordination of care about his headaches, intracranial hemorrhage and discussion about evaluation, treatment and answered questions.  Stroke team will sign off. Kindly call for  questions.  Antony Contras, MD Medical Director [facility] St[facility] Pager: [phone] 01/23/2018 2:22 PM  To contact Stroke Continuity provider, please refer to [email or web address] After hours, contact General Neurology

## 2018-01-23 NOTE — Consult Note (Signed)
Requesting Physician: Dr. Kathrynn Humble    Chief Complaint: Headache x 3 weeks  History obtained from: Patient and Chart    HPI:                                                                                                                                       [name] [name] is an 77 y.o. 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 He presents to the emergency room with 2-week history of headache.  Repeat CT head shows slight enlargement of prior hemorrhage   Patient had multifocal intraparenchymal hemorrhages on 9/29 with the largest one being on the right frontal lobe with a midline shift. MRI Brain did not show any underlying tumor.  Presented to the ER again on 10/22 with recurrent headaches showing stable bilateral intraparenchymal hemorrhages.   tPA Given: no, hemorrhage NIHSS: 0   Intracerebral Hemorrhage (ICH) Score  Glascow Coma Score 13-15 0  Age >/= 28 yesno 0  ICH volume no 0  IVH yes +1  Infratentorial origin no 0 Total:  1   Past Medical History:  Diagnosis Date  . Arthritis   . Atrial fibrillation ([place])    previously on Coumadin, reversed with head bleed  . Cataract   . Hyperlipidemia   . HYPERTENSION   . HYPOTHYROIDISM   . 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 Pre-diabetes   . Precancerous skin lesion    on back  . ROTATOR CUFF TEAR   . Subdural hematoma [facility])     Past Surgical History:  Procedure Laterality Date  . APPENDECTOMY    . CATARACT EXTRACTION W/PHACO Right 02/02/2014   Procedure: CATARACT EXTRACTION PHACO AND INTRAOCULAR LENS PLACEMENT (IOC);  Surgeon: Elta Guadeloupe T. Gershon Crane, MD;  Location: [facility];  Service: Ophthalmology;  Laterality: Right;  CDE 12.17  . CATARACT EXTRACTION  W/PHACO Left 02/16/2014   Procedure: CATARACT EXTRACTION PHACO AND INTRAOCULAR LENS PLACEMENT ;  Surgeon: Elta Guadeloupe T. Gershon Crane, MD;  Location: [facility];  Service: Ophthalmology;  Laterality: Left;  CDE:13.42  . CRANIOTOMY N/A 02/05/2017   Procedure: Bilateral Burr Holes . Craniotomy for Subdural;  Surgeon: Ditty, Kevan Ny, MD;  Location: [facility];  Service: Neurosurgery;  Laterality: N/A;  . EYE SURGERY  2015   cataract surgery. bilateral  . JOINT REPLACEMENT  2009   bilateral knee replacement  . KNEE SURGERY    . ROTATOR CUFF REPAIR Left   . SHOULDER SURGERY    . TOTAL KNEE ARTHROPLASTY Bilateral     Family History  Problem Relation Age of Onset  . Heart attack Brother   . Early death Brother   . Asthma Father   . Vision loss  Father   . Hypertension Mother   . Heart attack Brother 61  . Arthritis Brother    Social History:  reports that he has never smoked. He has never used smokeless tobacco. He reports that he does not drink alcohol or use drugs.  Allergies: No Known Allergies  Medications:                                                                                                                        I reviewed home medications   ROS:                                                                                                                                     14 systems reviewed and negative except above    Examination:                                                                                                      General: Appears well-developed  Psych: Affect appropriate to situation Eyes: No scleral injection HENT: No OP obstrucion Head: Normocephalic.  Cardiovascular: Normal rate and regular rhythm.  Respiratory: Effort normal and breath sounds normal to anterior ascultation GI: Soft.  No distension. There is no tenderness.  Skin: WDI   Neurological Examination Mental Status: Alert, oriented, thought content appropriate.  Speech fluent without  evidence of aphasia. Able to follow 3 step commands without difficulty. Cranial Nerves: II: Visual fields grossly normal,  III,IV, VI: ptosis not present, extra-ocular motions intact bilaterally, pupils equal, round, reactive to light and accommodation V,VII: smile symmetric, facial light touch sensation normal bilaterally VIII: hearing normal bilaterally IX,X: uvula rises symmetrically XI: bilateral shoulder shrug XII: midline tongue extension Motor: Right : Upper extremity   5/5    Left:     Upper extremity   5/5  Lower extremity   5/5     Lower extremity   5/5 Tone and bulk:normal tone throughout; no atrophy noted Sensory: Pinprick and light touch intact throughout,  bilaterally Deep Tendon Reflexes: 2+ and symmetric throughout Plantars: Right: downgoing   Left: downgoing Cerebellar: normal finger-to-nose, normal rapid alternating movements and normal heel-to-shin test Gait: normal gait and station     Lab Results: Basic Metabolic Panel: Recent Labs  Lab 01/22/18 2358  NA 135  K 3.9  CL 101  CO2 23  GLUCOSE 110*  BUN 13  CREATININE 0.97  CALCIUM 9.1    CBC: Recent Labs  Lab 01/22/18 2358  WBC 11.3*  NEUTROABS 7.3  HGB 14.4  HCT 44.3  MCV 89.0  PLT 242    Coagulation Studies: Recent Labs    01/22/18 2358  LABPROT 13.1  INR 1.00    Imaging: Ct Head Wo Contrast  Result Date: 01/23/2018 CLINICAL DATA:  Initial evaluation for acute headache, recent intracranial hemorrhages. EXAM: CT HEAD WITHOUT CONTRAST TECHNIQUE: Contiguous axial images were obtained from the base of the skull through the vertex without intravenous contrast. COMPARISON:  Prior CT from 12/17/2017. FINDINGS: Brain: Since the previous exam, the right frontal intraparenchymal hemorrhage has enlarged in size now measuring 3.5 x 2.4 x 1.9 cm (series 2, image 25). Localized edema within this region overall slightly improved. Adjacent cystic encephalomalacia of now contains a small amount of  layering hemorrhage (series 2, image 22). Additional left frontal intraparenchymal hematoma also increased in size now measuring 3.1 x 2.5 x 2.5 cm (series 2, image 22, previously 1.8 cm). Localized vasogenic edema slightly worsened from previous. Additional right frontotemporal hemorrhage also increased in size now measuring 1.5 x 1.7 x 1.1 cm (series 2, image 17). Localized vasogenic edema has worsened without significant regional mass effect. Additional soft tissue density at the parasagittal aspect of the anterior inferior left frontal lobe measures 1.5 x 1.2 x 1.3 cm with mild localized edema without midline shift. This may have been present on prior CT, although difficult to see on prior exam. Finding also likely reflects a small intraparenchymal hemorrhage. New small volume intraventricular hemorrhage with blood seen layering within the occipital horns of both lateral ventricles, likely via extension from the right frontal ventricular size is relatively stable without interval hydrocephalus. Basilar cisterns remain patent. No other acute intracranial hemorrhage. No subarachnoid or extra-axial hemorrhage appreciated. No acute large vessel territory infarct. Underlying cerebral atrophy, stable. Vascular: No hyperdense vessel. Scattered vascular calcifications noted within the carotid siphons. Skull: Scalp soft tissues demonstrate no acute abnormality. Sequelae of prior bilateral burr hole craniotomy. Sinuses/Orbits: Globes and orbital soft tissues within normal limits. Small bilateral maxillary sinus retention cyst noted. Paranasal sinuses are otherwise clear. Small chronic right mastoid effusion noted. Other: None. IMPRESSION: 1. Interval increase in size of bilateral intraparenchymal hematomas as above. Localized vasogenic edema without significant midline shift. 2. New small volume intraventricular hemorrhage, likely via extension from the right frontal hematoma and adjacent cystic encephalomalacia. Stable  ventricular size without hydrocephalus or ventricular trapping at this time. Critical Value/emergent results were called by telephone at the time of interpretation on 01/23/2018 at 12:13 am to Dr. Rolland Porter , who verbally acknowledged these results. Electronically Signed   By: Jeannine Boga M.D.   On: 01/23/2018 00:18     ASSESSMENT AND PLAN  77 year old male with a recent admission for multifocal intraparenchymal hemorrhages on 9/29 thought to be related to warfarin.  However given his INR was therapeutic range I suspect etiology for hemorrhage may be from possible underlying amyloid angiopathy after reviewing MRI SWI sequence.   Increase in size of prior bilateral hematomas and new Intraventricular hemorrhage -  possible rebleeding from ? Amyloid angiopathy   Recommendations:  Admit to medicine, no need for ICU admit  MRI brain w/wo contrast  BP goal less than 140/90 Neurochecks   Sushanth Aroor [facility] Pager Number [phone]

## 2018-01-23 NOTE — ED Notes (Signed)
Pt sleeping soundly.

## 2018-01-23 NOTE — ED Notes (Signed)
The pt arrived here from [place] [facility] a subdural hematoma.  Alert oriented skin warm and dry  No pain no distress

## 2018-01-23 NOTE — Consult Note (Signed)
Neurosurgery Consultation  Reason for Consult: Intracerebral hemorrhage Referring Physician: Blaine Hamper  CC: Confusion  HPI: This is a 77 y.o. man that presents with headaches and confusion. His neurosurgical history is notable for prior SDH evacuated by Dr. Arnoldo Morale last year as well as known prior multifocal ICH. He denies any new weakness or numbness. He is right handed and denies any difficulty with speech. No recent use of anti-platelet or anti-coagulant medications, was previously on warfarin but it was d/c'd due to [place].   ROS: A 14 point ROS was performed and is negative except as noted in the HPI.   PMHx:  Past Medical History:  Diagnosis Date  . Arthritis   . Atrial fibrillation ([place])    previously on Coumadin, reversed with head bleed  . Cataract   . Hyperlipidemia   . HYPERTENSION   . HYPOTHYROIDISM   . 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 Pre-diabetes   . Precancerous skin lesion    on back  . ROTATOR CUFF TEAR   . Subdural hematoma ([facility])    FamHx:  Family History  Problem Relation Age of Onset  . Heart attack Brother   . Early death Brother   . Asthma Father   . Vision loss Father   . Hypertension Mother   . Heart attack Brother 85  . Arthritis Brother    SocHx:  reports that he has never smoked. He has never used smokeless tobacco. He reports that he does not drink alcohol or use drugs.  Exam: Vital signs in last 24 hours: Temp:  [97.1 F (36.2 C)-97.8 F (36.6 C)] 97.8 F (36.6 C) (11/28 0218) Pulse Rate:  [62-79] 73 (11/28 0700) Resp:  [14-24] 15 (11/28 0700) BP: (126-176)/(72-100) 161/91 (11/28 0700) SpO2:  [89 %-100 %] 97 % (11/28 0700) Weight:  [124.7 kg] 124.7 kg (11/27 2256) General: Awake, alert, cooperative, lying in bed in NAD Head: normocephalic and atruamatic HEENT: neck supple Pulmonary: breathing room air comfortably, no evidence of increased work of breathing Abdomen: S NT ND Psych: affect full and  appropriate Extremities: warm and well perfused x4 Neuro: AOx3, PERRL, EOMI, FS Strength 5/5 x4, SILTx4, no drift  Assessment and Plan: 77 y.o. man with confusion and headaches. [place] personally reviewed, which shows multiple foci of intracerebral hemorrhage: R F area of encephalomalacia with new adjacent ICH that tracks into the ventricles and layers posteriorly, L F mixed density, R opercular subacute to chronic, L gyrus rectus acute to subacute, lesions do have surrounding vasogenic cerebral edema and brain compression. Prior MRI does not show significant dot burden that would be consistent with this severity of amyloid angiopathy.  -recommend admission for further workup of underlying cause of hemorrhages. Pt did not mention to me that he had a prior skin lesion removed, but it is in his problem list. No path results in Epic, but constellation of findings could be consistent with metastatic melanoma. Recommend MRI w/wo contrast including volumetric "stereotactic protocol" with fine cut T1 w/ contrast as well as gradient echo to evaluate for amyloid angiopathy. Given the concern for possible underlying hemorrhagic metastases, recommend CT CAP to r/o obvious metastatic disease. While less likely, this aggressive pattern of repeat hemorrhages (in the absence of anticoagulation or antiplatelets) will likely continue to cause deficits. -no acute neurosurgical intervention indicated at this time, ventricular configuration stable -please call with any concerns or questions  Judith Part, MD 01/23/18 7:32 AM [facility]

## 2018-01-23 NOTE — ED Notes (Signed)
Spoke with Dr. Sarajane Jews Re: pt's BP-will advise

## 2018-01-23 NOTE — Consult Note (Signed)
See H and P

## 2018-01-23 NOTE — ED Notes (Signed)
Neurosurgeon at bedside °

## 2018-01-23 NOTE — Care Management (Signed)
This is a no charge note  Pending admission per Dr. Dr. Kathrynn Humble  77 year old man with past medical history of atrial fibrillation not on anticoagulants, SDH, hypertension, hyperlipidemia, prediabetes, hypothyroidism, who presents with headache for 2 weeks.  CT scan showed intracranial hemorrhage.  Hemodynamically stable.  Both neurology, Dr. Lorraine Lax and neurosurgery, Dr. Venetia Constable will consulted. Pt is admitted to stepdown bed as inpatient.    CT-head: 1. Interval increase in size of bilateral intraparenchymal hematomas as above. Localized vasogenic edema without significant midline shift. 2. New small volume intraventricular hemorrhage, likely via extension from the right frontal hematoma and adjacent cystic encephalomalacia. Stable ventricular size without hydrocephalus or ventricular trapping at this time.   Ivor Costa, MD  [facility] Pager [phone]  If 7PM-7AM, please contact night-coverage [email or web address] Password [facility] 01/23/2018, 4:47 AM

## 2018-01-24 ENCOUNTER — Telehealth: Payer: Self-pay | Admitting: Family Medicine

## 2018-01-24 ENCOUNTER — Inpatient Hospital Stay (HOSPITAL_COMMUNITY): Payer: PPO

## 2018-01-24 DIAGNOSIS — R51 Headache: Secondary | ICD-10-CM

## 2018-01-24 DIAGNOSIS — Z8679 Personal history of other diseases of the circulatory system: Secondary | ICD-10-CM

## 2018-01-24 DIAGNOSIS — G939 Disorder of brain, unspecified: Secondary | ICD-10-CM

## 2018-01-24 DIAGNOSIS — L989 Disorder of the skin and subcutaneous tissue, unspecified: Secondary | ICD-10-CM

## 2018-01-24 LAB — CBC
HEMATOCRIT: 47.1 % (ref 39.0–52.0)
Hemoglobin: 15.2 g/dL (ref 13.0–17.0)
MCH: 28.4 pg (ref 26.0–34.0)
MCHC: 32.3 g/dL (ref 30.0–36.0)
MCV: 87.9 fL (ref 80.0–100.0)
Platelets: 262 10*3/uL (ref 150–400)
RBC: 5.36 MIL/uL (ref 4.22–5.81)
RDW: 13.2 % (ref 11.5–15.5)
WBC: 9.1 10*3/uL (ref 4.0–10.5)
nRBC: 0 % (ref 0.0–0.2)

## 2018-01-24 LAB — BASIC METABOLIC PANEL
Anion gap: 10 (ref 5–15)
BUN: 12 mg/dL (ref 8–23)
CHLORIDE: 101 mmol/L (ref 98–111)
CO2: 23 mmol/L (ref 22–32)
CREATININE: 0.87 mg/dL (ref 0.61–1.24)
Calcium: 9.1 mg/dL (ref 8.9–10.3)
GFR calc Af Amer: >60 mL/min (ref >60)
GFR calc non Af Amer: >60 mL/min (ref >60)
Glucose, Bld: 104 mg/dL — ABNORMAL HIGH (ref 70–99)
Potassium: 3.9 mmol/L (ref 3.5–5.1)
Sodium: 134 mmol/L — ABNORMAL LOW (ref 135–145)

## 2018-01-24 MED ORDER — DEXAMETHASONE 4 MG PO TABS
4.0000 mg | ORAL_TABLET | Freq: Two times a day (BID) | ORAL | Status: DC
  Administered 2018-01-24 – 2018-01-29 (×11): 4 mg via ORAL
  Filled 2018-01-24 (×11): qty 1

## 2018-01-24 MED ORDER — PANTOPRAZOLE SODIUM 40 MG PO TBEC
40.0000 mg | DELAYED_RELEASE_TABLET | Freq: Every day | ORAL | Status: DC
  Administered 2018-01-24 – 2018-01-29 (×6): 40 mg via ORAL
  Filled 2018-01-24 (×6): qty 1

## 2018-01-24 MED ORDER — IOHEXOL 300 MG/ML  SOLN
100.0000 mL | Freq: Once | INTRAMUSCULAR | Status: AC | PRN
  Administered 2018-01-24: 100 mL via INTRAVENOUS

## 2018-01-24 NOTE — Progress Notes (Addendum)
PROGRESS NOTE  [name] [name] ZOX:[id number] DOB: 07-28-1940 DOA: 01/22/2018 PCP: Dettinger, [name], MD   LOS: 1 day   Brief narrative:  77 year old man with past medical history of atrial fibrillation not on anticoagulants, SDH, hypertension, hyperlipidemia, prediabetes, hypothyroidism, presented with headache for 2 weeks.  CT scan of the head showed intracranial hemorrhage multiple hemorrhagic masses with localized edema but no midline shift.  Small intraventricular hemorrhage.  MRI of the brain was done with contrast showed no multiple brain masses hemorrhagic in nature with enhancement consistent with metastatic disease with a small intraventricular and subarachnoid hemorrhage.  No midline shift was noted.  Hemodynamically stable.  Both neurology,  and neurosurgery on board.  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  Unknown origin at this time.  Will get a CT scan of the chest, abdomen and pelvis with contrast to rule out lesions elsewhere potential surgical biopsy.  Patient has been seen by neurology and neurosurgery.  Currently nonfocal.  I have spoken with the patient at bedside and  patient's wife on the phone regarding this.  Patient and his wife would like to pursue on further work-up.  I also spoke with oncology Dr. Irene Limbo about the patient.  He has recommended CT scan of the chest and abdomen for now and he will follow-up for consult.  Start a low-dose Decadron today for vasogenic edema and headache  Atrial fibrillation.  Continue Cardizem, flecanide.  No anti-coagulation due to intracranial hemorrhage.  Rate controlled at this time.  History of hypertension.  Continue Cardizem, lisinopril and  hydralazine as needed.  Closely monitor blood pressure.  Blood pressure is slightly elevated at this time.  Hypothyroidism.  Continue Synthroid.  Hyperlipidemia.  Continue statin.  Congenital blindness of the left eye.  VTE Prophylaxis:   Code Status:  Full code  Family Communication: Spoke with the patient's wife on the phone and updated her about the clinical condition of the patient and the MRI findings.  Disposition Plan: Home likely in 1 to 2 days with home health.  Patient has been seen by physical therapy occupational therapy.  Await oncology opinion.  Check a CT scan of the chest and abdomen.   Consultants:  Neurology, neurosurgery  Oncology spoke with Dr. Irene Limbo  Procedures:  None  Antibiotics:  None  Subjective: Denies any headache and nausea vomiting.  Denies focal weakness  Objective: Vitals:   01/24/18 0429 01/24/18 0921  BP: 118/85 (!) 163/67  Pulse: 63 68  Resp: 17 18  Temp: (!) 97.5 F (36.4 C) 97.7 F (36.5 C)  SpO2: 96% 99%    Intake/Output Summary (Last 24 hours) at 01/24/2018 1118 Last data filed at 01/23/2018 2032 Gross per 24 hour  Intake 240 ml  Output 550 ml  Net -310 ml   Filed Weights   01/22/18 2256  Weight: 124.7 kg   Physical Examination:  General exam: Appears calm and comfortable ,Not in distress HEENT:PERRL,Oral mucosa moist, left eye blindness Respiratory system: Bilateral equal air entry, normal vesicular breath sounds, no wheezes or crackles  Cardiovascular system: S1 & S2 heard, RRR.  Gastrointestinal system: Abdomen is nondistended, soft and nontender. No organomegaly or masses felt. Normal bowel sounds heard. Central nervous system: Alert and oriented. No focal neurological deficits. Extremities: No edema, no  clubbing ,no cyanosis, distal peripheral pulses palpable. Skin: No rashes, lesions or ulcers,no icterus ,no pallor MSK: Normal muscle bulk,tone ,power   Data Review: I have personally reviewed the following  laboratory data and studies,  CBC: Recent Labs  Lab 01/22/18 2358 01/24/18 0533  WBC 11.3* 9.1  NEUTROABS 7.3  --   HGB 14.4 15.2  HCT 44.3 47.1  MCV 89.0 87.9  PLT 242 381   Basic Metabolic Panel: Recent Labs  Lab 01/22/18 2358 01/24/18 0533  NA 135 134*  K 3.9 3.9  CL 101 101  CO2 23 23  GLUCOSE 110* 104*  BUN 13 12  CREATININE 0.97 0.87  CALCIUM 9.1 9.1   Liver Function Tests: Recent Labs  Lab 01/22/18 2358  AST 27  ALT 36  ALKPHOS 66  BILITOT 0.9  PROT 7.7  ALBUMIN 4.3   No results for input(s): LIPASE, AMYLASE in the last 168 hours. No results for input(s): AMMONIA in the last 168 hours. Cardiac Enzymes: No results for input(s): CKTOTAL, CKMB, CKMBINDEX, TROPONINI in the last 168 hours. BNP (last 3 results) No results for input(s): BNP in the last 8760 hours.  ProBNP (last 3 results) No results for input(s): PROBNP in the last 8760 hours.  CBG: No results for input(s): GLUCAP in the last 168 hours. Recent Results (from the past 240 hour(s))  MRSA PCR Screening     Status: None   Collection Time: 01/23/18  2:25 PM  Result Value Ref Range Status   MRSA by PCR NEGATIVE NEGATIVE Final    Comment:        The GeneXpert MRSA Assay (FDA approved for NASAL specimens only), is one component of a comprehensive MRSA colonization surveillance program. It is not intended to diagnose MRSA infection nor to guide or monitor treatment for MRSA infections. Performed at [facility], [place] [address], [place], [place] [address]      Studies: Ct Head Wo Contrast  Result Date: 01/24/2018 CLINICAL DATA:  Follow-up examination for acute stroke. EXAM: CT HEAD WITHOUT CONTRAST TECHNIQUE: Contiguous axial images were obtained from the base of the skull through the vertex without intravenous contrast. COMPARISON:  Previous CT from 01/22/2018 as well as brain MRI from 01/23/2018. FINDINGS: Brain: Multiple hemorrhagic brain masses again seen, relatively stable and  unchanged in size as compared to most recent CT. Largest right frontal lobe mass measures 3.6 x 2.4 cm. Dominant left frontal lesion measures 2.7 x 2.8 cm. Mass at the left gyrus rectus measures 1.4 x 1.2 cm. Additional right frontal operculum lower lesion measures 1.5 x 1.5 cm. Surrounding vasogenic edema about several of these lesions, most notable at the left frontal lesion. No midline shift. Basilar cisterns remain patent. Cystic encephalomalacia at the adjacent right frontal lesion with is small amount of layering hemorrhage again noted. Intraventricular extension with small amount of blood seen layering within the occipital horns, also unchanged. No other new intracranial hemorrhage or mass lesion identified. No acute large vessel territory infarct. No extra-axial fluid collection. Atrophy with chronic microvascular ischemic disease again noted. Vascular: No hyperdense vessel. Scattered vascular calcifications noted within the carotid siphons. Skull: Scalp soft tissues demonstrate no acute finding. Sequelae of prior bilateral burr hole craniotomy. Sinuses/Orbits: Globes orbital soft tissues within normal limits. Patient status post bilateral ocular lens replacement. Paranasal sinuses remain largely clear. Small bilateral maxillary sinus retention cyst noted. Small bilateral mastoid effusions noted. Other: None. IMPRESSION: 1. Relatively stable appearance of multifocal hemorrhagic masses with localized edema as  above. No midline shift. 2. Associated small volume intraventricular hemorrhage, relatively unchanged. Stable ventricular size without hydrocephalus. 3. No other new acute intracranial abnormality. Electronically Signed   By: Jeannine Boga M.D.   On: 01/24/2018 02:17   Ct Head Wo Contrast  Result Date: 01/23/2018 CLINICAL DATA:  Initial evaluation for acute headache, recent intracranial hemorrhages. EXAM: CT HEAD WITHOUT CONTRAST TECHNIQUE: Contiguous axial images were obtained from the base  of the skull through the vertex without intravenous contrast. COMPARISON:  Prior CT from 12/17/2017. FINDINGS: Brain: Since the previous exam, the right frontal intraparenchymal hemorrhage has enlarged in size now measuring 3.5 x 2.4 x 1.9 cm (series 2, image 25). Localized edema within this region overall slightly improved. Adjacent cystic encephalomalacia of now contains a small amount of layering hemorrhage (series 2, image 22). Additional left frontal intraparenchymal hematoma also increased in size now measuring 3.1 x 2.5 x 2.5 cm (series 2, image 22, previously 1.8 cm). Localized vasogenic edema slightly worsened from previous. Additional right frontotemporal hemorrhage also increased in size now measuring 1.5 x 1.7 x 1.1 cm (series 2, image 17). Localized vasogenic edema has worsened without significant regional mass effect. Additional soft tissue density at the parasagittal aspect of the anterior inferior left frontal lobe measures 1.5 x 1.2 x 1.3 cm with mild localized edema without midline shift. This may have been present on prior CT, although difficult to see on prior exam. Finding also likely reflects a small intraparenchymal hemorrhage. New small volume intraventricular hemorrhage with blood seen layering within the occipital horns of both lateral ventricles, likely via extension from the right frontal ventricular size is relatively stable without interval hydrocephalus. Basilar cisterns remain patent. No other acute intracranial hemorrhage. No subarachnoid or extra-axial hemorrhage appreciated. No acute large vessel territory infarct. Underlying cerebral atrophy, stable. Vascular: No hyperdense vessel. Scattered vascular calcifications noted within the carotid siphons. Skull: Scalp soft tissues demonstrate no acute abnormality. Sequelae of prior bilateral burr hole craniotomy. Sinuses/Orbits: Globes and orbital soft tissues within normal limits. Small bilateral maxillary sinus retention cyst noted.  Paranasal sinuses are otherwise clear. Small chronic right mastoid effusion noted. Other: None. IMPRESSION: 1. Interval increase in size of bilateral intraparenchymal hematomas as above. Localized vasogenic edema without significant midline shift. 2. New small volume intraventricular hemorrhage, likely via extension from the right frontal hematoma and adjacent cystic encephalomalacia. Stable ventricular size without hydrocephalus or ventricular trapping at this time. Critical Value/emergent results were called by telephone at the time of interpretation on 01/23/2018 at 12:13 am to Dr. Rolland Porter , who verbally acknowledged these results. Electronically Signed   By: Jeannine Boga M.D.   On: 01/23/2018 00:18   Mr Jeri Cos BO Contrast  Result Date: 01/23/2018 CLINICAL DATA:  Headaches.  Intracranial hemorrhages. EXAM: MRI HEAD WITHOUT AND WITH CONTRAST TECHNIQUE: Multiplanar, multiecho pulse sequences of the brain and surrounding structures were obtained without and with intravenous contrast. CONTRAST:  10 mL Gadavist COMPARISON:  Head CT 01/22/2018 and MRI 12/12/2017 FINDINGS: Brain: A hemorrhagic right frontal mass demonstrates solid enhancement and measures 3.2 x 2.7 cm (series 15, image 130). A cystic component or separate cystic lesion/cystic encephalomalacia inferior to the solid mass measures 4.2 x 2.6 cm and demonstrates a small amount of peripheral linear and nodular enhancement (series 15, image 116). A 1.7 cm hemorrhagic right frontal lobe mass anterior to the insula has enlarged from the prior MRI and now clearly enhances (series 15, image 90). A 1.2 cm enhancing mass in the anteromedial left  frontal lobe is new or larger compared to the prior MRI (series 15, image 89). A 2.6 x 2.6 cm hemorrhagic lateral left frontal lobe mass has enlarged from the prior MRI and now also clearly has solid enhancing components. A 6 mm ring-enhancing inferior right frontal lesion is new (series 15, image 79). Motion  artifact could obscure additional small enhancing lesions. There is moderate vasogenic edema associated with the lateral left frontal mass with milder edema associated with other bilateral frontal masses. As seen on the recent head CT, there is a small amount of blood in the occipital horns of the lateral ventricles. There is also trace subarachnoid hemorrhage posteriorly in the sylvian fissures. There is not a significant chronic microhemorrhage burden to suggest cerebral amyloid angiopathy. There is moderate cerebral atrophy, and there is ex vacuo dilatation of the right frontal horn. There is no evidence of acute infarct, midline shift, or extra-axial fluid collection. Vascular: Major intracranial vascular flow voids are preserved. Skull and upper cervical spine: Bilateral burr holes related to prior subdural hematoma evacuation. No suspicious marrow lesion. Sinuses/Orbits: Bilateral cataract extraction. Small bilateral maxillary sinus mucous retention cysts. Small bilateral mastoid effusions. Other: None. IMPRESSION: 1. Multiple brain masses, many of which are hemorrhagic and now clearly demonstrate enhancement. These are consistent with metastases. 2. Up to moderate associated vasogenic edema.  No midline shift. 3. Small volume intraventricular and subarachnoid hemorrhage. Electronically Signed   By: Logan Bores M.D.   On: 01/23/2018 17:51    Scheduled Meds: . atorvastatin  10 mg Oral Daily  . diltiazem  360 mg Oral Daily  . flecainide  100 mg Oral BID  . levothyroxine  100 mcg Oral QAC breakfast  . lisinopril  20 mg Oral Daily  . senna-docusate  1 tablet Oral BID  . sodium chloride flush  3 mL Intravenous Q12H  . sodium chloride flush  3 mL Intravenous Q12H  . topiramate  50 mg Oral BID   Continuous Infusions: . sodium chloride      Time spent: 25 minutes. More than 50% of that time was spent in counseling and/or coordination of care.  Reaghan Kawa  [facility] Pager  [phone]  If 7PM-7AM, please contact night-coverage at [email or web address], password [facility] 01/24/2018, 11:18 AM

## 2018-01-24 NOTE — Telephone Encounter (Signed)
Discussed with patient's wife about MRI results and how it is a change from a month ago and they are just confused about how all of this could have popped up with the previous MRI showed no tumors, they are understanding and it does look like all of these tumors have arisen since the last month. Caryl Pina, MD [facility] 01/24/2018, 12:04 PM

## 2018-01-24 NOTE — Care Management Note (Signed)
Case Management Note  Patient Details  Name: [name] [name] MRN: [id number] Date of Birth: Nov 23, 1940  Subjective/Objective:      Pt admitted with ICH and brain masses. He is from home with his spouse. Pt also helps to care for grandchildren at home.  Has had no issues with transportation and obtaining his medications.              Action/Plan: Recommendations are for [facility] services. CM provided the patient choice from [facility] list off [email or web address]. He selected [place]. Copy of list placed in patients chart. Butch Penny with [facility] notified and accepted the referral. MD pt will need [place] orders and F2F prior to d/c. CM following.  Expected Discharge Date:  01/25/18               Expected Discharge Plan:  [place]  In-House Referral:     Discharge planning Services  CM Consult  Post Acute Care Choice:  Home Health Choice offered to:  Patient  DME Arranged:    DME Agency:     HH Arranged:  PT, OT HH Agency:  [place]  Status of Service:  In process, will continue to follow  If discussed at Long Length of Stay Meetings, dates discussed:    Additional Comments:  Pollie Friar, RN 01/24/2018, 12:46 PM

## 2018-01-24 NOTE — Evaluation (Signed)
Physical Therapy Evaluation Patient Details Name: [name] [name] MRN: [id number] DOB: 07/05/1940 Today's Date: 01/24/2018   History of Present Illness  (P) This 77 y.o. 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  Repeat CT of head showed multifocal hemorrhagic masses with localized edema.  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  Small volume intraventricular SAH.  PMH includes: Lt rotator cuff tear, Legally blind OS, HTN, cataracts, A-Fib   Clinical Impression  Pt is close to baseline functioning and should be safe at home in home environment.  HHPT can work on some of higher level balance activity to allow pt ot continue managing his household.    Follow Up Recommendations Home health PT;Supervision - Intermittent    Equipment Recommendations  None recommended by PT    Recommendations for Other Services       Precautions / Restrictions Precautions Precautions: (P) Fall(minor )      Mobility  Bed Mobility Overal bed mobility: Modified Independent                Transfers Overall transfer level: Modified independent                  Ambulation/Gait Ambulation/Gait assistance: Min guard Gait Distance (Feet): 300 Feet Assistive device: None Gait Pattern/deviations: Step-through pattern Gait velocity: able to increase speed significantly Gait velocity interpretation: >2.62 ft/sec, indicative of community ambulatory General Gait Details: generally steady, but guarded  Stairs Stairs: Yes Stairs assistance: Supervision Stair Management: One rail Right;Alternating pattern;Forwards Number of Stairs: 4 General stair comments: safe with the rail  Wheelchair Mobility    Modified Rankin (Stroke Patients Only) Modified  Rankin (Stroke Patients Only) Pre-Morbid Rankin Score: No symptoms Modified Rankin: Slight disability     Balance                                 Standardized Balance Assessment Standardized Balance Assessment : Dynamic Gait Index   Dynamic Gait Index Level Surface: Normal Change in Gait Speed: Normal Gait with Horizontal Head Turns: Normal Gait with Vertical Head Turns: Normal Gait and Pivot Turn: Normal Step Over Obstacle: Mild Impairment Step Around Obstacles: Mild Impairment Steps: Mild Impairment Total Score: 21       Pertinent Vitals/Pain Pain Assessment: (P) No/denies pain    Home Living Family/patient expects to be discharged to:: (P) Private residence Living Arrangements: (P) Spouse/significant other Available Help at Discharge: (P) Family;Available 24 hours/day Type of Home: (P) House Home Access: (P) Ramped entrance     Home Layout: (P) One level Home Equipment: (P) Walker - 2 wheels;Cane - single point;Bedside commode;Shower seat Additional Comments: (P) he and his wife have custody of great grandchildren ages 62,6, and 75 y.o.  Wife has had multiple knee surgeries, and requires assist for IADLs     Prior Function Level of Independence: Independent         Comments: (P) Pt denies h/o falls or use of AD.  He reports he drives      Hand Dominance   Dominant Hand: (P) Right    Extremity/Trunk Assessment   Upper Extremity Assessment Upper Extremity Assessment: Defer to OT evaluation    Lower Extremity Assessment Lower Extremity Assessment: Overall WFL for tasks assessed(overall L LE  noticeably weaker in all major muscle groups)       Communication   Communication: (P) No difficulties  Cognition Arousal/Alertness: (P) Awake/alert Behavior During Therapy: (P) WFL for tasks assessed/performed Overall Cognitive Status: (P) Impaired/Different from baseline Area of Impairment: (P) Attention;Memory;Awareness;Problem solving                                       General Comments      Exercises     Assessment/Plan    PT Assessment All further PT needs can be met in the next venue of care  PT Problem List Decreased strength;Decreased balance;Decreased mobility       PT Treatment Interventions      PT Goals (Current goals can be found in the Care Plan section)  Acute Rehab PT Goals Patient Stated Goal: home PT Goal Formulation: All assessment and education complete, DC therapy    Frequency     Barriers to discharge        Co-evaluation               AM-PAC PT "6 Clicks" Mobility  Outcome Measure Help needed turning from your back to your side while in a flat bed without using bedrails?: None Help needed moving from lying on your back to sitting on the side of a flat bed without using bedrails?: None Help needed moving to and from a bed to a chair (including a wheelchair)?: None Help needed standing up from a chair using your arms (e.g., wheelchair or bedside chair)?: None Help needed to walk in hospital room?: A Lot Help needed climbing 3-5 steps with a railing? : A Little 6 Click Score: 21    End of Session   Activity Tolerance: Patient tolerated treatment well Patient left: in chair;with call bell/phone within reach;with chair alarm set Nurse Communication: Mobility status PT Visit Diagnosis: Unsteadiness on feet (R26.81);Other symptoms and signs involving the nervous system (R29.898)    Time: 7106-2694 PT Time Calculation (min) (ACUTE ONLY): 20 min   Charges:   PT Evaluation $PT Eval Low Complexity: 1 Low          01/24/2018  Donnella Sham, PT Acute Rehabilitation Services [phone]  (pager) [phone]  (office)  Tessie Fass Neviah Braud 01/24/2018, 12:40 PM

## 2018-01-24 NOTE — Evaluation (Signed)
Occupational Therapy Evaluation Patient Details Name: [name] [name] MRN: [id number] DOB: March 13, 1940 Today's Date: 01/24/2018    History of Present Illness This 77 y.o. 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  Repeat CT of head showed multifocal hemorrhagic masses with localized edema.  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  Small volume intraventricular SAH.  PMH includes: Lt rotator cuff tear, Legally blind OS, HTN, cataracts, A-Fib    Clinical Impression   Pt admitted with above. He demonstrates the below listed deficits and will benefit from continued OT to maximize safety and independence with BADLs.  Pt presents to OT with Lt shoulder weakness, as well as cognitive deficits including deficits with attention, memory, problem solving.  Pt readily acknowledges deficits and is agreeable to all OT recommendations.  He lives at home with his wife who has mobility limitations, and great grandchildren ages 17,22, and 31 years old of whom they have custody.   Recommend HHOT and HHSLP, and no driving.       Follow Up Recommendations  Home health OT; HHSLP    Equipment Recommendations    None   Recommendations for Other Services  SLP     Precautions / Restrictions Precautions Precautions: Fall(minor )      Mobility Bed Mobility Overal bed mobility: Modified Independent                Transfers Overall transfer level: Modified independent                    Balance                                 Standardized Balance Assessment Standardized Balance Assessment : Dynamic Gait Index   Dynamic Gait Index Level Surface: Normal Change in Gait Speed: Normal Gait with Horizontal Head Turns: Normal Gait with  Vertical Head Turns: Normal Gait and Pivot Turn: Normal Step Over Obstacle: Mild Impairment Step Around Obstacles: Mild Impairment Steps: Mild Impairment Total Score: 21     ADL either performed or assessed with clinical judgement   ADL Overall ADL's : Modified independent Eating/Feeding: Independent   Grooming: Wash/dry hands;Wash/dry face;Oral care;Brushing hair;Supervision/safety;Standing                                       Vision Baseline Vision/History: Cataracts;Wears glasses(blind OS ) Wears Glasses: Reading only Patient Visual Report: No change from baseline Vision Assessment?: Yes Eye Alignment: Within Functional Limits Ocular Range of Motion: Within Functional Limits Alignment/Gaze Preference: Within Defined Limits Tracking/Visual Pursuits: Able to track stimulus in all quads without difficulty Visual Fields: No apparent deficits     Perception Perception Perception Tested?: Yes   Praxis Praxis Praxis tested?: Within functional limits    Pertinent Vitals/Pain Pain Assessment: No/denies pain     Hand Dominance Right   Extremity/Trunk Assessment Upper Extremity Assessment Upper Extremity Assessment: LUE deficits/detail LUE Deficits / Details: shoulder 3+/5.  He moves using shoulder abduction and has difficulty flexing shoulder when flexing elbow - he abducts shoulder.  He does report h/o rotator cuff repair.  Elbow distally 4/5.  Pt indicates Lt shoulder weakness is not his  baseline and is a new deficit    Lower Extremity Assessment Lower Extremity Assessment: Defer to PT evaluation       Communication Communication Communication: No difficulties   Cognition Arousal/Alertness: Awake/alert Behavior During Therapy: WFL for tasks assessed/performed Overall Cognitive Status: Impaired/Different from baseline Area of Impairment: Attention;Memory;Awareness;Problem solving                   Current Attention Level: Selective Memory:  Decreased short-term memory     Awareness: Emergent Problem Solving: Requires verbal cues General Comments: Pt requires glasses for reading and therefore could not complete MOCA.  Did administer the sections that did not require visual imput.  He demonstrates deficit with memory, as well as problem solving - he could only provide one way to to make $13 (test requires 3 ways).  He reports this is abnormal for him as he is usually strong at math).  He was noted to repeatedly pick up ringing telephone without turning on the button.   He distracts easily with environmental distractions    General Comments  discussed cognitive deficits with pt and recommendation for follow up [place] and SLP, he is very agreeable.  Also discussed recommendation for no driving, he is also agreeable to this     Exercises     Shoulder Instructions      Home Living Family/patient expects to be discharged to:: Private residence Living Arrangements: Spouse/significant other Available Help at Discharge: Family;Available 24 hours/day Type of Home: House Home Access: Ramped entrance     Home Layout: One level     Bathroom Shower/Tub: Occupational psychologist: Handicapped height     Home Equipment: Environmental consultant - 2 wheels;Cane - single point;Bedside commode;Shower seat   Additional Comments: he and his wife have custody of great grandchildren ages 22,6, and 66 y.o.  Wife has had multiple knee surgeries, and requires assist for IADLs       Prior Functioning/Environment Level of Independence: Independent        Comments: Pt denies h/o falls or use of AD.  He reports he drives         OT Problem List: Decreased strength;Decreased activity tolerance;Decreased cognition;Obesity;Impaired UE functional use      OT Treatment/Interventions: Self-care/ADL training;Therapeutic exercise;DME and/or AE instruction;Therapeutic activities;Cognitive remediation/compensation;Patient/family education    OT Goals(Current  goals can be found in the care plan section) Acute Rehab OT Goals Patient Stated Goal: to get better  OT Goal Formulation: With patient Time For Goal Achievement: 02/07/18 Potential to Achieve Goals: Good ADL Goals Additional ADL Goal #1: Pt will recall events of day with min cues Additional ADL Goal #2: Pt will perform money management with min cues Additional ADL Goal #3: Pt will selectively attend to ADL tasks with no cues  OT Frequency: Min 2X/week   Barriers to D/C:            Co-evaluation              AM-PAC OT "6 Clicks" Daily Activity     Outcome Measure Help from another person eating meals?: None Help from another person taking care of personal grooming?: None Help from another person toileting, which includes using toliet, bedpan, or urinal?: None Help from another person bathing (including washing, rinsing, drying)?: None Help from another person to put on and taking off regular upper body clothing?: None Help from another person to put on and taking off regular lower body clothing?: None 6 Click Score: 24  End of Session    Activity Tolerance: Patient tolerated treatment well Patient left: in bed;with call bell/phone within reach  OT Visit Diagnosis: Cognitive communication deficit (R41.841);Muscle weakness (generalized) (M62.81)                Time: 6203-5597 OT Time Calculation (min): 28 min Charges:  OT General Charges $OT Visit: 1 Visit OT Evaluation $OT Eval Moderate Complexity: 1 Mod OT Treatments $Self Care/Home Management : 8-22 mins  Lucille Passy, OTR/L Acute Rehabilitation Services Pager [phone] Office [phone]   Lucille Passy M 01/24/2018, 1:45 PM

## 2018-01-25 DIAGNOSIS — E785 Hyperlipidemia, unspecified: Secondary | ICD-10-CM

## 2018-01-25 DIAGNOSIS — I4891 Unspecified atrial fibrillation: Secondary | ICD-10-CM

## 2018-01-25 DIAGNOSIS — C7931 Secondary malignant neoplasm of brain: Principal | ICD-10-CM

## 2018-01-25 DIAGNOSIS — I10 Essential (primary) hypertension: Secondary | ICD-10-CM

## 2018-01-25 DIAGNOSIS — E279 Disorder of adrenal gland, unspecified: Secondary | ICD-10-CM

## 2018-01-25 LAB — BASIC METABOLIC PANEL
Anion gap: 13 (ref 5–15)
BUN: 12 mg/dL (ref 8–23)
CO2: 20 mmol/L — ABNORMAL LOW (ref 22–32)
Calcium: 9.4 mg/dL (ref 8.9–10.3)
Chloride: 100 mmol/L (ref 98–111)
Creatinine, Ser: 1.13 mg/dL (ref 0.61–1.24)
GFR calc Af Amer: >60 mL/min (ref >60)
GFR calc non Af Amer: >60 mL/min (ref >60)
GLUCOSE: 183 mg/dL — AB (ref 70–99)
Potassium: 4.1 mmol/L (ref 3.5–5.1)
Sodium: 133 mmol/L — ABNORMAL LOW (ref 135–145)

## 2018-01-25 LAB — CBC
HEMATOCRIT: 48.5 % (ref 39.0–52.0)
Hemoglobin: 15.7 g/dL (ref 13.0–17.0)
MCH: 28.4 pg (ref 26.0–34.0)
MCHC: 32.4 g/dL (ref 30.0–36.0)
MCV: 87.9 fL (ref 80.0–100.0)
Platelets: 284 10*3/uL (ref 150–400)
RBC: 5.52 MIL/uL (ref 4.22–5.81)
RDW: 13 % (ref 11.5–15.5)
WBC: 6 10*3/uL (ref 4.0–10.5)
nRBC: 0 % (ref 0.0–0.2)

## 2018-01-25 NOTE — Consult Note (Addendum)
HEMATOLOGY/ONCOLOGY CONSULTATION NOTE  Date of Service: 01/25/2018  Patient Care Team: Dettinger, [name], MD as PCP - General (Family Medicine) Rutherford Guys, MD as Consulting Physician (Ophthalmology) Stanford Breed Denice Bors, MD as Consulting Physician (Cardiology) Rogene Houston, MD as Consulting Physician (Gastroenterology) Gaynelle Arabian, MD as Consulting Physician (Orthopedic Surgery) Celene Squibb, MD as Consulting Physician (Dermatology) Dettinger, [name], MD as Consulting Physician (Family Medicine)  CHIEF COMPLAINTS/PURPOSE OF CONSULTATION:  Multiple hemorrhagic brain metastases  HISTORY OF PRESENTING ILLNESS:   [name] [name] is a  77 y.o. male who has been referred to [place] by Dr .Shelly Coss, MD  for evaluation and management of newly noted multiple brain metastases.  Patient has a history of hypertension, dyslipidemia, hypothyroidism, prediabetes, atrial fibrillation [currently off anticoagulation due to intracranial bleeding). 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 Marland Kitchen Ditty who was in the hospital in late September 2019 with multifocal intracerebral hemorrhages with a significant bleed in the right frontal lobe and altered mental status.  Warfarin was reversed with vitamin K and Kcentra.  Patient improved and was discharged home on 11/29/2017.   Patient was again in the hospital ED on 12/02/2017 with confusion.  He has been following with his primary care physician Dr. Vonna Kotyk Dettinger for continued follow-up of these intraparenchymal hemorrhages.  Patient presented on 11/28 with 2-week history of worsening headaches and CT scan showed enlargement of prior areas of hemorrhage.  MRI of the brain done on 01/23/2018 showed - Multiple brain masses, many of which are hemorrhagic and now clearly demonstrate enhancement. These are consistent with metastases.  Up to moderate associated vasogenic edema.  No midline  shift. Neurology and neurosurgery are following.  CT of the chest abdomen pelvis was done to evaluate for possible primary tumor on 01/24/2018 and showed No definite primary neoplasm. Several nonspecific findings are noted within the chest in abdomen including a 5 cm left adrenal mass. This may represent a benign or malignant tumor.Metastatic disease not excluded. More definitive characterization.  Patient received some dexamethasone and notes that his headaches have been resolved at this time.  We were consulted for medical oncology input to determine further management. Patient reports no acute new focal symptoms at this time. Wife notes that he seems to be at his recent baseline.  She notes that he has had some balance issues but no other acute new neurological symptoms.  Patient reports having multiple skin lesions that were determined to be precancerous removed in the past.  He follows with Dr. Allyn Kenner in [place] for his dermatology cares but notes that it has been more than a year since last follow-up.  Endorses no other focal symptomatology suggestive of a primary lesion. Does not report any specific new skin lesion that is obviously enlarging. No change in bowel habits.  No new urinary symptoms. No cough or acute shortness of breath.   MEDICAL HISTORY:  Past Medical History:  Diagnosis Date  . Arthritis   . Atrial fibrillation ([place])    previously on Coumadin, reversed with head bleed  . Cataract   . Hyperlipidemia   . HYPERTENSION   . HYPOTHYROIDISM   . 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 Pre-diabetes   . Precancerous skin lesion    on back  . ROTATOR CUFF TEAR   . Subdural hematoma ([facility])     SURGICAL HISTORY: Past Surgical History:  Procedure  Laterality Date  . APPENDECTOMY    . CATARACT EXTRACTION W/PHACO Right 02/02/2014   Procedure: CATARACT EXTRACTION PHACO AND INTRAOCULAR LENS PLACEMENT (IOC);  Surgeon: Elta Guadeloupe T. Gershon Crane, MD;   Location: [facility];  Service: Ophthalmology;  Laterality: Right;  CDE 12.17  . CATARACT EXTRACTION W/PHACO Left 02/16/2014   Procedure: CATARACT EXTRACTION PHACO AND INTRAOCULAR LENS PLACEMENT ;  Surgeon: Elta Guadeloupe T. Gershon Crane, MD;  Location: [facility];  Service: Ophthalmology;  Laterality: Left;  CDE:13.42  . CRANIOTOMY N/A 02/05/2017   Procedure: Bilateral Burr Holes . Craniotomy for Subdural;  Surgeon: Ditty, Kevan Ny, MD;  Location: [facility];  Service: Neurosurgery;  Laterality: N/A;  . EYE SURGERY  2015   cataract surgery. bilateral  . JOINT REPLACEMENT  2009   bilateral knee replacement  . KNEE SURGERY    . ROTATOR CUFF REPAIR Left   . SHOULDER SURGERY    . TOTAL KNEE ARTHROPLASTY Bilateral     SOCIAL HISTORY: Social History   Socioeconomic History  . Marital status: Married    Spouse name: Not on file  . Number of children: 2  . Years of education: Not on file  . Highest education level: Not on file  Occupational History  . Not on file  Social Needs  . Financial resource strain: Not hard at all  . Food insecurity:    Worry: Never true    Inability: Never true  . Transportation needs:    Medical: No    Non-medical: No  Tobacco Use  . Smoking status: Never Smoker  . Smokeless tobacco: Never Used  Substance and Sexual Activity  . Alcohol use: No    Frequency: Never    Comment: remote use, h/o heavy binge use about 25 years ago  . Drug use: No    Comment: remote h/o marijuana use  . Sexual activity: Never    Birth control/protection: None  Lifestyle  . Physical activity:    Days per week: 0 days    Minutes per session: 0 min  . Stress: Not at all  Relationships  . Social connections:    Talks on phone: More than three times a week    Gets together: More than three times a week    Attends religious service: More than 4 times per year    Active member of club or organization: Yes    Attends meetings of clubs or organizations: More than 4 times per year     Relationship status: Married  . Intimate partner violence:    Fear of current or ex partner: Not on file    Emotionally abused: Not on file    Physically abused: Not on file    Forced sexual activity: Not on file  Other Topics Concern  . Not on file  Social History Narrative        FAMILY HISTORY: Family History  Problem Relation Age of Onset  . Heart attack Brother   . Early death Brother   . Asthma Father   . Vision loss Father   . Hypertension Mother   . Heart attack Brother 45  . Arthritis Brother     ALLERGIES:  has No Known Allergies.  MEDICATIONS:  Current Facility-Administered Medications  Medication Dose Route Frequency Provider Last Rate Last Dose  . 0.9 %  sodium chloride infusion  250 mL Intravenous PRN Samuella Cota, MD      . acetaminophen (TYLENOL) tablet 650 mg  650 mg Oral Q6H PRN Samuella Cota, MD  650 mg at 01/24/18 1408  . atorvastatin (LIPITOR) tablet 10 mg  10 mg Oral Daily Samuella Cota, MD   10 mg at 01/25/18 1019  . dexamethasone (DECADRON) tablet 4 mg  4 mg Oral Q12H Pokhrel, Laxman, MD   4 mg at 01/25/18 1019  . diltiazem (CARDIZEM CD) 24 hr capsule 360 mg  360 mg Oral Daily Samuella Cota, MD   360 mg at 01/25/18 1018  . flecainide (TAMBOCOR) tablet 100 mg  100 mg Oral BID Samuella Cota, MD   100 mg at 01/25/18 1019  . labetalol (NORMODYNE,TRANDATE) injection 5 mg  5 mg Intravenous Q2H PRN Samuella Cota, MD      . levothyroxine (SYNTHROID, LEVOTHROID) tablet 100 mcg  100 mcg Oral QAC breakfast Samuella Cota, MD   100 mcg at 01/25/18 0454  . lisinopril (PRINIVIL,ZESTRIL) tablet 20 mg  20 mg Oral Daily Samuella Cota, MD   20 mg at 01/25/18 1018  . ondansetron (ZOFRAN) tablet 4 mg  4 mg Oral Q6H PRN Samuella Cota, MD       Or  . ondansetron [facility]) injection 4 mg  4 mg Intravenous Q6H PRN Samuella Cota, MD      . pantoprazole (PROTONIX) EC tablet 40 mg  40 mg Oral Daily Pokhrel, Laxman, MD   40 mg at  01/25/18 1018  . senna-docusate (Senokot-S) tablet 1 tablet  1 tablet Oral BID Samuella Cota, MD   1 tablet at 01/25/18 1018  . sodium chloride flush (NS) 0.9 % injection 3 mL  3 mL Intravenous Q12H Samuella Cota, MD   3 mL at 01/24/18 1054  . sodium chloride flush (NS) 0.9 % injection 3 mL  3 mL Intravenous Q12H Samuella Cota, MD   3 mL at 01/25/18 1020  . sodium chloride flush (NS) 0.9 % injection 3 mL  3 mL Intravenous PRN Samuella Cota, MD      . topiramate (TOPAMAX) tablet 50 mg  50 mg Oral BID Donzetta Starch, NP   50 mg at 01/25/18 1019    REVIEW OF SYSTEMS:    10 Point review of Systems was done is negative except as noted above.  PHYSICAL EXAMINATION: ECOG PERFORMANCE STATUS: 2 - Symptomatic, <50% confined to bed  . Vitals:   01/25/18 0309 01/25/18 0840  BP: (!) 143/69 117/69  Pulse: 71 66  Resp: 20 16  Temp: 97.7 F (36.5 C) 98.1 F (36.7 C)  SpO2: 98% 95%   Filed Weights   01/22/18 2256  Weight: 275 lb (124.7 kg)   .Body mass index is 40.61 kg/m.  GENERAL:alert, in no acute distress and comfortable SKIN: no acute rashes, extensive hyperpigmented and raised skin lesions and evidence of chronic skin sun injury. EYES: conjunctiva are pink and non-injected, sclera anicteric OROPHARYNX: MMM, no exudates, no oropharyngeal erythema or ulceration NECK: supple, no JVD LYMPH:  no palpable lymphadenopathy in the cervical, axillary or inguinal regions LUNGS: clear to auscultation b/l with normal respiratory effort HEART: regular rate & rhythm ABDOMEN:  normoactive bowel sounds , non tender, not distended. Extremity: trace pedal edema b/l PSYCH: alert & oriented x 3 with fluent speech NEURO: no overt focal motor deficits  LABORATORY DATA:  I have reviewed the data as listed  . CBC Latest Ref Rng & Units 01/25/2018 01/24/2018 01/22/2018  WBC 4.0 - 10.5 K/uL 6.0 9.1 11.3(H)  Hemoglobin 13.0 - 17.0 g/dL 15.7 15.2 14.4  Hematocrit 39.0 -  52.0 % 48.5  47.1 44.3  Platelets 150 - 400 K/uL 284 262 242    . CMP Latest Ref Rng & Units 01/25/2018 01/24/2018 01/22/2018  Glucose 70 - 99 mg/dL 183(H) 104(H) 110(H)  BUN 8 - 23 mg/dL 12 12 13   Creatinine 0.61 - 1.24 mg/dL 1.13 0.87 0.97  Sodium 135 - 145 mmol/L 133(L) 134(L) 135  Potassium 3.5 - 5.1 mmol/L 4.1 3.9 3.9  Chloride 98 - 111 mmol/L 100 101 101  CO2 22 - 32 mmol/L 20(L) 23 23  Calcium 8.9 - 10.3 mg/dL 9.4 9.1 9.1  Total Protein 6.5 - 8.1 g/dL - - 7.7  Total Bilirubin 0.3 - 1.2 mg/dL - - 0.9  Alkaline Phos 38 - 126 U/L - - 66  AST 15 - 41 U/L - - 27  ALT 0 - 44 U/L - - 36   Component     Latest Ref Rng & Units 01/23/2018  Color, Urine     YELLOW YELLOW  Appearance     CLEAR CLEAR  Specific Gravity, Urine     1.005 - 1.030 1.018  pH     5.0 - 8.0 6.0  Glucose, UA     NEGATIVE mg/dL NEGATIVE  Hgb urine dipstick     NEGATIVE NEGATIVE  Bilirubin Urine     NEGATIVE NEGATIVE  Ketones, ur     NEGATIVE mg/dL NEGATIVE  Protein     NEGATIVE mg/dL NEGATIVE  Nitrite     NEGATIVE NEGATIVE  Leukocytes, UA     NEGATIVE NEGATIVE     RADIOGRAPHIC STUDIES: I have personally reviewed the radiological images as listed and agreed with the findings in the report. Ct Head Wo Contrast  Result Date: 01/24/2018 CLINICAL DATA:  Follow-up examination for acute stroke. EXAM: CT HEAD WITHOUT CONTRAST TECHNIQUE: Contiguous axial images were obtained from the base of the skull through the vertex without intravenous contrast. COMPARISON:  Previous CT from 01/22/2018 as well as brain MRI from 01/23/2018. FINDINGS: Brain: Multiple hemorrhagic brain masses again seen, relatively stable and unchanged in size as compared to most recent CT. Largest right frontal lobe mass measures 3.6 x 2.4 cm. Dominant left frontal lesion measures 2.7 x 2.8 cm. Mass at the left gyrus rectus measures 1.4 x 1.2 cm. Additional right frontal operculum lower lesion measures 1.5 x 1.5 cm. Surrounding vasogenic edema about  several of these lesions, most notable at the left frontal lesion. No midline shift. Basilar cisterns remain patent. Cystic encephalomalacia at the adjacent right frontal lesion with is small amount of layering hemorrhage again noted. Intraventricular extension with small amount of blood seen layering within the occipital horns, also unchanged. No other new intracranial hemorrhage or mass lesion identified. No acute large vessel territory infarct. No extra-axial fluid collection. Atrophy with chronic microvascular ischemic disease again noted. Vascular: No hyperdense vessel. Scattered vascular calcifications noted within the carotid siphons. Skull: Scalp soft tissues demonstrate no acute finding. Sequelae of prior bilateral burr hole craniotomy. Sinuses/Orbits: Globes orbital soft tissues within normal limits. Patient status post bilateral ocular lens replacement. Paranasal sinuses remain largely clear. Small bilateral maxillary sinus retention cyst noted. Small bilateral mastoid effusions noted. Other: None. IMPRESSION: 1. Relatively stable appearance of multifocal hemorrhagic masses with localized edema as above. No midline shift. 2. Associated small volume intraventricular hemorrhage, relatively unchanged. Stable ventricular size without hydrocephalus. 3. No other new acute intracranial abnormality. Electronically Signed   By: Jeannine Boga M.D.   On: 01/24/2018 02:17   Ct Head Wo  Contrast  Result Date: 01/23/2018 CLINICAL DATA:  Initial evaluation for acute headache, recent intracranial hemorrhages. EXAM: CT HEAD WITHOUT CONTRAST TECHNIQUE: Contiguous axial images were obtained from the base of the skull through the vertex without intravenous contrast. COMPARISON:  Prior CT from 12/17/2017. FINDINGS: Brain: Since the previous exam, the right frontal intraparenchymal hemorrhage has enlarged in size now measuring 3.5 x 2.4 x 1.9 cm (series 2, image 25). Localized edema within this region overall  slightly improved. Adjacent cystic encephalomalacia of now contains a small amount of layering hemorrhage (series 2, image 22). Additional left frontal intraparenchymal hematoma also increased in size now measuring 3.1 x 2.5 x 2.5 cm (series 2, image 22, previously 1.8 cm). Localized vasogenic edema slightly worsened from previous. Additional right frontotemporal hemorrhage also increased in size now measuring 1.5 x 1.7 x 1.1 cm (series 2, image 17). Localized vasogenic edema has worsened without significant regional mass effect. Additional soft tissue density at the parasagittal aspect of the anterior inferior left frontal lobe measures 1.5 x 1.2 x 1.3 cm with mild localized edema without midline shift. This may have been present on prior CT, although difficult to see on prior exam. Finding also likely reflects a small intraparenchymal hemorrhage. New small volume intraventricular hemorrhage with blood seen layering within the occipital horns of both lateral ventricles, likely via extension from the right frontal ventricular size is relatively stable without interval hydrocephalus. Basilar cisterns remain patent. No other acute intracranial hemorrhage. No subarachnoid or extra-axial hemorrhage appreciated. No acute large vessel territory infarct. Underlying cerebral atrophy, stable. Vascular: No hyperdense vessel. Scattered vascular calcifications noted within the carotid siphons. Skull: Scalp soft tissues demonstrate no acute abnormality. Sequelae of prior bilateral burr hole craniotomy. Sinuses/Orbits: Globes and orbital soft tissues within normal limits. Small bilateral maxillary sinus retention cyst noted. Paranasal sinuses are otherwise clear. Small chronic right mastoid effusion noted. Other: None. IMPRESSION: 1. Interval increase in size of bilateral intraparenchymal hematomas as above. Localized vasogenic edema without significant midline shift. 2. New small volume intraventricular hemorrhage, likely via  extension from the right frontal hematoma and adjacent cystic encephalomalacia. Stable ventricular size without hydrocephalus or ventricular trapping at this time. Critical Value/emergent results were called by telephone at the time of interpretation on 01/23/2018 at 12:13 am to Dr. Rolland Porter , who verbally acknowledged these results. Electronically Signed   By: Jeannine Boga M.D.   On: 01/23/2018 00:18   Ct Chest W Contrast  Result Date: 01/24/2018 CLINICAL DATA:  Evaluate for metastatic disease. Initial cancer workup. EXAM: CT CHEST, ABDOMEN, AND PELVIS WITH CONTRAST TECHNIQUE: Multidetector CT imaging of the chest, abdomen and pelvis was performed following the standard protocol during bolus administration of intravenous contrast. CONTRAST:  150mL OMNIPAQUE IOHEXOL 300 MG/ML  SOLN COMPARISON:  None. FINDINGS: CT CHEST FINDINGS Cardiovascular: Heart size appears within normal limits. No pericardial effusion identified. Aortic atherosclerosis. Calcifications within the LAD and RCA coronary arteries noted. Mediastinum/Nodes: Normal appearance of the thyroid gland. The trachea appears patent and is midline. No axillary or supraclavicular adenopathy. Within the left posterior mediastinum there is a lymph node measuring 1.4 cm, image 37/3. Lungs/Pleura: No pleural effusion, airspace consolidation or atelectasis. Pulmonary nodule within the posterior costophrenic sulcus measures 9 mm, image 124/5. Also within the posterior left costophrenic sulcus is a 8 mm lung nodule, image 115/5. Musculoskeletal: No chest wall mass or suspicious bone lesions identified. CT ABDOMEN PELVIS FINDINGS Hepatobiliary: No focal liver abnormality is seen. No gallstones, gallbladder wall thickening, or biliary dilatation. Pancreas:  Cystic lesion arising from the tail of pancreas measures 1.9 by 2.2 cm, image 52/3. No inflammation or main duct dilatation. Spleen: Spleen is normal. Adrenals/Urinary Tract: Normal appearance of the  right adrenal gland. Left adrenal nodule measures 4.4 by 3.3 by 5.0 cm and measures 46 HU. Normal appearance of the right kidney. Multiple exophytic cysts are noted arising from the upper and lower pole of left kidney. Indeterminate exophytic lesion arising from the anterior cortex of the interpolar left kidney measures 1 cm and 49 HU, image 64/3. No hydronephrosis identified bilaterally. No focal bladder lesion identified. Stomach/Bowel: Stomach appears normal. The small bowel loops have a normal course and caliber. No pathologic dilatation of the colon. Distal colonic diverticula noted without acute inflammation. Vascular/Lymphatic: Aortic atherosclerosis. No aneurysm. No abdominopelvic adenopathy. Reproductive: Prostate is unremarkable. Other: No abdominal wall hernia or abnormality. No abdominopelvic ascites. Musculoskeletal: Spondylosis noted within the lumbar spine. No aggressive lytic or sclerotic bone lesion. IMPRESSION: 1. No definite primary neoplasm identified. Several nonspecific findings are noted within the chest in abdomen including a 5 cm left adrenal mass. This may represent a benign or malignant tumor. Metastatic disease not excluded. More definitive characterization could be obtained with contrast enhanced abdominal MRI. 2. Small hyperdense left kidney lesion measures 1 cm and 49 HU. This may represent either and small enhancing neoplasm or hemorrhagic/proteinaceous cyst. This could also be further characterized with contrast enhanced abdominal MRI. 3. Single enlarged left posterior mediastinal lymph node measuring 1.4 cm. 4. Small exophytic cystic lesion arising from tail of pancreas measures 2.2 cm. Further characterization with contrast enhanced MR of the abdomen is advised. 5. 2 small nodules noted within the posterior costophrenic sulcus within the left lower lobe, nonspecific. 6. As an alternative to further imaging with contrast enhanced abdominal MRI a PET-CT may be considered to assess  for areas of hypermetabolism which may help guide biopsy of any suspicious hypermetabolic lesions. Electronically Signed   By: Kerby Moors M.D.   On: 01/24/2018 21:32   Mr Jeri Cos PO Contrast  Result Date: 01/23/2018 CLINICAL DATA:  Headaches.  Intracranial hemorrhages. EXAM: MRI HEAD WITHOUT AND WITH CONTRAST TECHNIQUE: Multiplanar, multiecho pulse sequences of the brain and surrounding structures were obtained without and with intravenous contrast. CONTRAST:  10 mL Gadavist COMPARISON:  Head CT 01/22/2018 and MRI 12/12/2017 FINDINGS: Brain: A hemorrhagic right frontal mass demonstrates solid enhancement and measures 3.2 x 2.7 cm (series 15, image 130). A cystic component or separate cystic lesion/cystic encephalomalacia inferior to the solid mass measures 4.2 x 2.6 cm and demonstrates a small amount of peripheral linear and nodular enhancement (series 15, image 116). A 1.7 cm hemorrhagic right frontal lobe mass anterior to the insula has enlarged from the prior MRI and now clearly enhances (series 15, image 90). A 1.2 cm enhancing mass in the anteromedial left frontal lobe is new or larger compared to the prior MRI (series 15, image 89). A 2.6 x 2.6 cm hemorrhagic lateral left frontal lobe mass has enlarged from the prior MRI and now also clearly has solid enhancing components. A 6 mm ring-enhancing inferior right frontal lesion is new (series 15, image 79). Motion artifact could obscure additional small enhancing lesions. There is moderate vasogenic edema associated with the lateral left frontal mass with milder edema associated with other bilateral frontal masses. As seen on the recent head CT, there is a small amount of blood in the occipital horns of the lateral ventricles. There is also trace subarachnoid hemorrhage posteriorly in  the sylvian fissures. There is not a significant chronic microhemorrhage burden to suggest cerebral amyloid angiopathy. There is moderate cerebral atrophy, and there is ex  vacuo dilatation of the right frontal horn. There is no evidence of acute infarct, midline shift, or extra-axial fluid collection. Vascular: Major intracranial vascular flow voids are preserved. Skull and upper cervical spine: Bilateral burr holes related to prior subdural hematoma evacuation. No suspicious marrow lesion. Sinuses/Orbits: Bilateral cataract extraction. Small bilateral maxillary sinus mucous retention cysts. Small bilateral mastoid effusions. Other: None. IMPRESSION: 1. Multiple brain masses, many of which are hemorrhagic and now clearly demonstrate enhancement. These are consistent with metastases. 2. Up to moderate associated vasogenic edema.  No midline shift. 3. Small volume intraventricular and subarachnoid hemorrhage. Electronically Signed   By: Logan Bores M.D.   On: 01/23/2018 17:51   Ct Abdomen Pelvis W Contrast  Result Date: 01/24/2018 CLINICAL DATA:  Evaluate for metastatic disease. Initial cancer workup. EXAM: CT CHEST, ABDOMEN, AND PELVIS WITH CONTRAST TECHNIQUE: Multidetector CT imaging of the chest, abdomen and pelvis was performed following the standard protocol during bolus administration of intravenous contrast. CONTRAST:  14mL OMNIPAQUE IOHEXOL 300 MG/ML  SOLN COMPARISON:  None. FINDINGS: CT CHEST FINDINGS Cardiovascular: Heart size appears within normal limits. No pericardial effusion identified. Aortic atherosclerosis. Calcifications within the LAD and RCA coronary arteries noted. Mediastinum/Nodes: Normal appearance of the thyroid gland. The trachea appears patent and is midline. No axillary or supraclavicular adenopathy. Within the left posterior mediastinum there is a lymph node measuring 1.4 cm, image 37/3. Lungs/Pleura: No pleural effusion, airspace consolidation or atelectasis. Pulmonary nodule within the posterior costophrenic sulcus measures 9 mm, image 124/5. Also within the posterior left costophrenic sulcus is a 8 mm lung nodule, image 115/5. Musculoskeletal: No  chest wall mass or suspicious bone lesions identified. CT ABDOMEN PELVIS FINDINGS Hepatobiliary: No focal liver abnormality is seen. No gallstones, gallbladder wall thickening, or biliary dilatation. Pancreas: Cystic lesion arising from the tail of pancreas measures 1.9 by 2.2 cm, image 52/3. No inflammation or main duct dilatation. Spleen: Spleen is normal. Adrenals/Urinary Tract: Normal appearance of the right adrenal gland. Left adrenal nodule measures 4.4 by 3.3 by 5.0 cm and measures 46 HU. Normal appearance of the right kidney. Multiple exophytic cysts are noted arising from the upper and lower pole of left kidney. Indeterminate exophytic lesion arising from the anterior cortex of the interpolar left kidney measures 1 cm and 49 HU, image 64/3. No hydronephrosis identified bilaterally. No focal bladder lesion identified. Stomach/Bowel: Stomach appears normal. The small bowel loops have a normal course and caliber. No pathologic dilatation of the colon. Distal colonic diverticula noted without acute inflammation. Vascular/Lymphatic: Aortic atherosclerosis. No aneurysm. No abdominopelvic adenopathy. Reproductive: Prostate is unremarkable. Other: No abdominal wall hernia or abnormality. No abdominopelvic ascites. Musculoskeletal: Spondylosis noted within the lumbar spine. No aggressive lytic or sclerotic bone lesion. IMPRESSION: 1. No definite primary neoplasm identified. Several nonspecific findings are noted within the chest in abdomen including a 5 cm left adrenal mass. This may represent a benign or malignant tumor. Metastatic disease not excluded. More definitive characterization could be obtained with contrast enhanced abdominal MRI. 2. Small hyperdense left kidney lesion measures 1 cm and 49 HU. This may represent either and small enhancing neoplasm or hemorrhagic/proteinaceous cyst. This could also be further characterized with contrast enhanced abdominal MRI. 3. Single enlarged left posterior mediastinal  lymph node measuring 1.4 cm. 4. Small exophytic cystic lesion arising from tail of pancreas measures 2.2 cm. Further characterization  with contrast enhanced MR of the abdomen is advised. 5. 2 small nodules noted within the posterior costophrenic sulcus within the left lower lobe, nonspecific. 6. As an alternative to further imaging with contrast enhanced abdominal MRI a PET-CT may be considered to assess for areas of hypermetabolism which may help guide biopsy of any suspicious hypermetabolic lesions. Electronically Signed   By: Kerby Moors M.D.   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 MRI brain and CT head reviewed as noted above CT chest/abd /pelvis - do not demonstrate evidence of an unequivocal site of primary lesion.  2) left adrenal gland lesion unclear etiology benign incidentaloma versus malignancy  PLAN Reviewed available lab results and imaging studies with the patient and his wife at bedside. Discussed concerning finding for multiple hemorrhagic metastatic lesions in the brain. Discussed the common tumors that difficulty present with hemorrhagic metastases. Given multiple skin lesions cannot rule out the possibility of metastatic melanoma.  Patient cannot report a specific change lesion.  He was recommended to follow-up with Dr. [place] Crane as outpatient for repeat skin screening. We discussed that the most direct approach to diagnosis would be to biopsy 1 of the brain metastases if possible to determine primary tumor and molecular mutations that might have a bearing on systemic therapies. Alternatively if the patient chooses to and given the urgency of treating his hemorrhagic brain metastases he could choose to have empiric radiation treatments for his brain metastases without a formal tissue diagnosis. Would recommend consulting radiation oncology and neuro-oncology/Dr.  Mickeal Skinner tomorrow morning. -To weigh in. It would be appropriate to consider MRI adrenal gland protocol to determine if the left adrenal lesion appears malignant and might be a good target for a biopsy by interventional radiology to shed light on the primary tumor. All these diagnostic and treatment approaches were discussed with the patient in details and multiple questions by him and his wife were answered. Anticoagulation is on hold due to intraparenchymal hemorrhage. PT/OT evaluation Might consider outpatient PET CT scan based on his goals of care. Plan of care was communicated with Dr. Tawanna Solo.  Appreciate excellent care by hospitalist team.  All of the patients questions were answered with apparent satisfaction. The patient knows to call the clinic with any problems, questions or concerns.  I spent 60 minutes counseling the patient face to face. The total time spent in the appointment was 80 minutes and more than 50% was on counseling and direct patient cares.    Sullivan Lone MD [place] AAHIVMS [facility] Hematology/Oncology Physician [facility]  (Office):       [phone] (Work cell):  [phone] (Fax):           [phone]  01/25/2018 12:41 PM

## 2018-01-25 NOTE — Evaluation (Addendum)
Speech Language Pathology Evaluation Patient Details Name: [name] [name] MRN: [id number] DOB: [birth year] Today's Date: 01/25/2018 Time: 2025-4270 SLP Time Calculation (min) (ACUTE ONLY): 30 min  Problem List:  Patient Active Problem List   Diagnosis Date Noted  . Brain metastases ([place])   . Lesion of adrenal gland ([place])   . Precancerous skin lesion   . ICH (intracerebral hemorrhage) ([place]) 01/23/2018  . Warfarin anticoagulation   . H/O subdural hemorrhage   . History of burr hole surgery   . Hyponatremia 02/05/2017  . Somnolence 02/05/2017  . Headache 01/28/2017  . Subdural hematoma ([place]) 01/23/2017  . Pre-diabetes 09/14/2015  . Paroxysmal atrial fibrillation ([place]) 03/30/2015  . HLD (hyperlipidemia) 03/02/2015  . Hypothyroidism 06/26/2008  . Essential hypertension 06/26/2008  . ROTATOR CUFF TEAR 06/26/2008   Past Medical History:  Past Medical History:  Diagnosis Date  . Arthritis   . Atrial fibrillation ([name])    previously on Coumadin, reversed with head bleed  . Cataract   . Hyperlipidemia   . HYPERTENSION   . HYPOTHYROIDISM   . 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 Pre-diabetes   . Precancerous skin lesion    on back  . ROTATOR CUFF TEAR   . Subdural hematoma [facility])    Past Surgical History:  Past Surgical History:  Procedure Laterality Date  . APPENDECTOMY    . CATARACT EXTRACTION W/PHACO Right 02/02/2014   Procedure: CATARACT EXTRACTION PHACO AND INTRAOCULAR LENS PLACEMENT (IOC);  Surgeon: Elta Guadeloupe T. Gershon Crane, MD;  Location: [facility];  Service: Ophthalmology;  Laterality: Right;  CDE 12.17  . CATARACT EXTRACTION W/PHACO Left 02/16/2014   Procedure: CATARACT EXTRACTION PHACO AND INTRAOCULAR LENS PLACEMENT ;  Surgeon: Elta Guadeloupe T. Gershon Crane, MD;  Location: [facility];  Service: Ophthalmology;  Laterality: Left;  CDE:13.42  . CRANIOTOMY N/A 02/05/2017   Procedure: Bilateral Burr Holes . Craniotomy for Subdural;  Surgeon: Ditty, Kevan Ny, MD;   Location: [facility];  Service: Neurosurgery;  Laterality: N/A;  . EYE SURGERY  2015   cataract surgery. bilateral  . JOINT REPLACEMENT  2009   bilateral knee replacement  . KNEE SURGERY    . ROTATOR CUFF REPAIR Left   . SHOULDER SURGERY    . 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  Repeat CT showed enlargement of prior hemorrhage.  Seen by neurology in the emergency department with recommendation for admission to stepdown unit, no need for ICU, check MRI brain with and without contrast, BP goal less than 140/90. 01/23/18 MRI brain indicated Multiple brain masses, many of which are hemorrhagic and now clearly demonstrate enhancement. These are consistent with metastases.  Assessment / Plan / Recommendation Clinical Impression   Portion of M[facility] Cognitive Assessment) completed this date after pt received news of prognosis prior to SLP arriving; decreased attention to tasks and noted distraction present during assessment which may be related to recent dx/processing this reality; difficult to discern if present prior to recent diagnosis or exacerbation of previous cognitive deficits.  Pt able to follow multi-step directives, oriented x4, verbalize complex information re: diagnosis/medical history, but exhibited STM deficits with recall and decreased organizational skills/problem solving with calculations; safety/awareness of deficits appeared adequate; difficulty repeating complex sentences,  but was able to name items when asked confrontationally.  He was intelligible within complex conversation; ST will f/u prn for continued cognitive assessment.    SLP Assessment  SLP  Recommendation/Assessment: Patient needs continued Speech Language Pathology Services SLP Visit Diagnosis: Cognitive communication deficit (R41.841)    Follow Up Recommendations  Other (comment)(TBD)    Frequency and Duration min 1 x/week  1 week      SLP Evaluation Cognition  Overall Cognitive Status: Impaired/Different from baseline Arousal/Alertness: Awake/alert Orientation Level: Oriented X4 Attention: Sustained Sustained Attention: Impaired Sustained Attention Impairment: Verbal complex;Functional complex Memory: Impaired Memory Impairment: Decreased recall of new information;Decreased short term memory Decreased Short Term Memory: Verbal complex;Functional complex Awareness: Appears intact Problem Solving: Appears intact Safety/Judgment: Appears intact       Comprehension  Auditory Comprehension Overall Auditory Comprehension: Appears within functional limits for tasks assessed Yes/No Questions: Within Functional Limits Commands: Within Functional Limits Conversation: Complex Interfering Components: Attention;Anxiety;Working memory EffectiveTechniques: Repetition;Extra processing time Visual Recognition/Discrimination Discrimination: Not tested Reading Comprehension Reading Status: Not tested    Expression Expression Primary Mode of Expression: Verbal Verbal Expression Overall Verbal Expression: Appears within functional limits for tasks assessed Initiation: No impairment Level of Generative/Spontaneous Verbalization: Conversation Repetition: Impaired Level of Impairment: Sentence level Naming: No impairment Pragmatics: No impairment Interfering Components: Attention Non-Verbal Means of Communication: Not applicable Written Expression Dominant Hand: Right Written Expression: Not tested   Oral / Motor  Oral Motor/Sensory Function Overall Oral Motor/Sensory Function: Within functional limits Motor Speech Overall Motor Speech: Appears within functional  limits for tasks assessed Respiration: Within functional limits Phonation: Normal Resonance: Within functional limits Articulation: Within functional limitis Intelligibility: Intelligible Motor Planning: Witnin functional limits Motor Speech Errors: Not applicable                      Elvina Sidle, M.S., CCC-SLP 01/25/2018, 5:56 PM

## 2018-01-25 NOTE — Progress Notes (Signed)
PROGRESS NOTE    [name] [name]  CVE:[id number] DOB: 1940-09-04 DOA: 01/22/2018 PCP: Dettinger, [name], MD   Brief Narrative: Patient is a 77 year old man with past medical history of A. fib not on anti-Coblation, subdural hemorrhage, hypertension, hyperlipidemia, hypothyroidism who presented with 2 weeks history of headache. Mri brain showed intracranial hemorrhage with multiple hemorrhagic masses with localized edema but no midline shift.  There was also presence of small intraventricular hemorrhage,subarachnoid hemorrhage. Oncology consulted for the possible metastatic process.Started on decadron.   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  Origin is unknown at this time. Oncology was consulted and was recommended to get a CT chest/abdomen/pelvis which showed multiple abnormalities suggesting metastatic disease but no primary. We will probably order an MRI of the abdomen versus PET-CT. Will defer this to oncology first. Dr Irene Limbo is following. Started on Decadron for vasogenic edema and headache. We will follow-up with stroke clinic in a month.  Headache: Much better now.  Continue dexamethasone and Topamax.  A. fib: On Cardizem and flecainide.  Not on anticoagulation.  Rate controlled.  Hypertension: Continue Cardizem, lisinopril and hydralazine.  Blood pressure stable.  Hypothyroidism: Continue Synthyroid  Hyperlipidemia: Continue statin  Deconditioning/debility: Patient evaluated by physical therapy and recommended home health on discharge.   DVT prophylaxis: SCD Code Status: Full Family Communication:  Disposition Plan: pending workup.Home with home health after clearance from  oncology.   Consultants: Oncology  Procedures:None  Antimicrobials:None  Subjective: Patient seen and examined the bedside this afternoon.  Remains comfortable.  No active issues.  No complaints.  Waiting for oncology evaluation.  Objective: Vitals:   01/24/18 2053 01/25/18 0017 01/25/18 0309 01/25/18 0840  BP: 118/65 (!) 107/57 (!) 143/69 117/69  Pulse: 63 60 71 66  Resp: 15 20 20 16   Temp: 98.3 F (36.8 C) 98 F (36.7 C) 97.7 F (36.5 C) 98.1 F (36.7 C)  TempSrc: Oral Oral Oral Oral  SpO2: 97% 99% 98% 95%  Weight:      Height:        Intake/Output Summary (Last 24 hours) at 01/25/2018 1145 Last data filed at 01/25/2018 0841 Gross per 24 hour  Intake 940 ml  Output 300 ml  Net 640 ml   Filed Weights   01/22/18 2256  Weight: 124.7 kg    Examination:  General exam: Appears calm and comfortable ,Not in distress,average built HEENT:left eye blindness,Oral mucosa moist, Ear/Nose normal on gross exam Respiratory system: Bilateral equal air entry, normal vesicular breath sounds, no wheezes or crackles  Cardiovascular system: S1 & S2 heard, RRR. No JVD, murmurs, rubs, gallops or clicks. No pedal edema. Gastrointestinal system: Abdomen is nondistended, soft and nontender. No organomegaly or masses felt. Normal bowel sounds heard. Central nervous system: Alert and oriented. No focal neurological deficits. Extremities: No edema, no clubbing ,no cyanosis, distal peripheral pulses palpable. Skin: No rashes, lesions or ulcers,no icterus ,no pallor MSK: Normal muscle bulk,tone ,power Psychiatry: Judgement and insight appear normal. Mood & affect appropriate.     Data Reviewed: I have personally reviewed following labs and imaging studies  CBC: Recent Labs  Lab 01/22/18 2358 01/24/18 0533 01/25/18 0456  WBC 11.3* 9.1 6.0  NEUTROABS 7.3  --   --   HGB 14.4 15.2 15.7  HCT 44.3 47.1 48.5  MCV 89.0 87.9 87.9  PLT 242 262 240   Basic Metabolic Panel: Recent Labs    Lab 01/22/18 2358 01/24/18 0533 01/25/18 0456  NA 135 134* 133*  K 3.9 3.9 4.1  CL 101 101 100  CO2 23 23 20*  GLUCOSE 110* 104* 183*  BUN 13 12 12   CREATININE 0.97 0.87 1.13  CALCIUM 9.1 9.1 9.4   GFR: Estimated Creatinine Clearance: 73.7 mL/min (by C-G formula based on SCr of 1.13 mg/dL). Liver Function Tests: Recent Labs  Lab 01/22/18 2358  AST 27  ALT 36  ALKPHOS 66  BILITOT 0.9  PROT 7.7  ALBUMIN 4.3   No results for input(s): LIPASE, AMYLASE in the last 168 hours. No results for input(s): AMMONIA in the last 168 hours. Coagulation Profile: Recent Labs  Lab 01/22/18 2358  INR 1.00   Cardiac Enzymes: No results for input(s): CKTOTAL, CKMB, CKMBINDEX, TROPONINI in the last 168 hours. BNP (last 3 results) No results for input(s): PROBNP in the last 8760 hours. HbA1C: No results for input(s): HGBA1C in the last 72 hours. CBG: No results for input(s): GLUCAP in the last 168 hours. Lipid Profile: No results for input(s): CHOL, HDL, LDLCALC, TRIG, CHOLHDL, LDLDIRECT in the last 72 hours. Thyroid Function Tests: No results for input(s): TSH, T4TOTAL, FREET4, T3FREE, THYROIDAB in the last 72 hours. Anemia Panel: No results for input(s): VITAMINB12, FOLATE, FERRITIN, TIBC, IRON, RETICCTPCT in the last 72 hours. Sepsis Labs: No results for input(s): PROCALCITON, LATICACIDVEN in the last 168 hours.  Recent Results (from the past 240 hour(s))  MRSA PCR Screening     Status: None   Collection Time: 01/23/18  2:25 PM  Result Value Ref Range Status   MRSA by PCR NEGATIVE NEGATIVE Final    Comment:        The GeneXpert MRSA Assay (FDA approved for NASAL specimens only), is one component of a comprehensive MRSA colonization surveillance program. It is not intended to diagnose MRSA infection nor to guide or monitor treatment for MRSA infections. Performed at [facility], [place] [address] [place], [place] [address]          Radiology Studies: Ct  Head Wo Contrast  Result Date: 01/24/2018 CLINICAL DATA:  Follow-up examination for acute stroke. EXAM: CT HEAD WITHOUT CONTRAST TECHNIQUE: Contiguous axial images were obtained from the base of the skull through the vertex without intravenous contrast. COMPARISON:  Previous CT from 01/22/2018 as well as brain MRI from 01/23/2018. FINDINGS: Brain: Multiple hemorrhagic brain masses again seen, relatively stable and unchanged in size as compared to most recent CT. Largest right frontal lobe mass measures 3.6 x 2.4 cm. Dominant left frontal lesion measures 2.7 x 2.8 cm. Mass at the left gyrus rectus measures 1.4 x 1.2 cm. Additional right frontal operculum lower lesion measures 1.5 x 1.5 cm. Surrounding vasogenic edema about several of these lesions, most notable at the left frontal lesion. No midline shift. Basilar cisterns remain patent. Cystic encephalomalacia at the adjacent right frontal lesion with is small amount of layering hemorrhage again noted. Intraventricular extension with small amount of blood seen layering within the occipital horns, also unchanged. No other new intracranial hemorrhage or mass lesion identified. No acute large vessel territory infarct. No extra-axial fluid collection. Atrophy with chronic microvascular ischemic disease again noted. Vascular: No hyperdense vessel. Scattered vascular calcifications noted within the carotid siphons. Skull: Scalp soft tissues demonstrate no acute finding. Sequelae of prior bilateral burr hole craniotomy.  Sinuses/Orbits: Globes orbital soft tissues within normal limits. Patient status post bilateral ocular lens replacement. Paranasal sinuses remain largely clear. Small bilateral maxillary sinus retention cyst noted. Small bilateral mastoid effusions noted. Other: None. IMPRESSION: 1. Relatively stable appearance of multifocal hemorrhagic masses with localized edema as above. No midline shift. 2. Associated small volume intraventricular hemorrhage,  relatively unchanged. Stable ventricular size without hydrocephalus. 3. No other new acute intracranial abnormality. Electronically Signed   By: Jeannine Boga M.D.   On: 01/24/2018 02:17   Ct Chest W Contrast  Result Date: 01/24/2018 CLINICAL DATA:  Evaluate for metastatic disease. Initial cancer workup. EXAM: CT CHEST, ABDOMEN, AND PELVIS WITH CONTRAST TECHNIQUE: Multidetector CT imaging of the chest, abdomen and pelvis was performed following the standard protocol during bolus administration of intravenous contrast. CONTRAST:  128mL OMNIPAQUE IOHEXOL 300 MG/ML  SOLN COMPARISON:  None. FINDINGS: CT CHEST FINDINGS Cardiovascular: Heart size appears within normal limits. No pericardial effusion identified. Aortic atherosclerosis. Calcifications within the LAD and RCA coronary arteries noted. Mediastinum/Nodes: Normal appearance of the thyroid gland. The trachea appears patent and is midline. No axillary or supraclavicular adenopathy. Within the left posterior mediastinum there is a lymph node measuring 1.4 cm, image 37/3. Lungs/Pleura: No pleural effusion, airspace consolidation or atelectasis. Pulmonary nodule within the posterior costophrenic sulcus measures 9 mm, image 124/5. Also within the posterior left costophrenic sulcus is a 8 mm lung nodule, image 115/5. Musculoskeletal: No chest wall mass or suspicious bone lesions identified. CT ABDOMEN PELVIS FINDINGS Hepatobiliary: No focal liver abnormality is seen. No gallstones, gallbladder wall thickening, or biliary dilatation. Pancreas: Cystic lesion arising from the tail of pancreas measures 1.9 by 2.2 cm, image 52/3. No inflammation or main duct dilatation. Spleen: Spleen is normal. Adrenals/Urinary Tract: Normal appearance of the right adrenal gland. Left adrenal nodule measures 4.4 by 3.3 by 5.0 cm and measures 46 HU. Normal appearance of the right kidney. Multiple exophytic cysts are noted arising from the upper and lower pole of left kidney.  Indeterminate exophytic lesion arising from the anterior cortex of the interpolar left kidney measures 1 cm and 49 HU, image 64/3. No hydronephrosis identified bilaterally. No focal bladder lesion identified. Stomach/Bowel: Stomach appears normal. The small bowel loops have a normal course and caliber. No pathologic dilatation of the colon. Distal colonic diverticula noted without acute inflammation. Vascular/Lymphatic: Aortic atherosclerosis. No aneurysm. No abdominopelvic adenopathy. Reproductive: Prostate is unremarkable. Other: No abdominal wall hernia or abnormality. No abdominopelvic ascites. Musculoskeletal: Spondylosis noted within the lumbar spine. No aggressive lytic or sclerotic bone lesion. IMPRESSION: 1. No definite primary neoplasm identified. Several nonspecific findings are noted within the chest in abdomen including a 5 cm left adrenal mass. This may represent a benign or malignant tumor. Metastatic disease not excluded. More definitive characterization could be obtained with contrast enhanced abdominal MRI. 2. Small hyperdense left kidney lesion measures 1 cm and 49 HU. This may represent either and small enhancing neoplasm or hemorrhagic/proteinaceous cyst. This could also be further characterized with contrast enhanced abdominal MRI. 3. Single enlarged left posterior mediastinal lymph node measuring 1.4 cm. 4. Small exophytic cystic lesion arising from tail of pancreas measures 2.2 cm. Further characterization with contrast enhanced MR of the abdomen is advised. 5. 2 small nodules noted within the posterior costophrenic sulcus within the left lower lobe, nonspecific. 6. As an alternative to further imaging with contrast enhanced abdominal MRI a PET-CT may be considered to assess for areas of hypermetabolism which may help guide biopsy of any suspicious hypermetabolic  lesions. Electronically Signed   By: Kerby Moors M.D.   On: 01/24/2018 21:32   Mr Jeri Cos ID Contrast  Result Date:  01/23/2018 CLINICAL DATA:  Headaches.  Intracranial hemorrhages. EXAM: MRI HEAD WITHOUT AND WITH CONTRAST TECHNIQUE: Multiplanar, multiecho pulse sequences of the brain and surrounding structures were obtained without and with intravenous contrast. CONTRAST:  10 mL Gadavist COMPARISON:  Head CT 01/22/2018 and MRI 12/12/2017 FINDINGS: Brain: A hemorrhagic right frontal mass demonstrates solid enhancement and measures 3.2 x 2.7 cm (series 15, image 130). A cystic component or separate cystic lesion/cystic encephalomalacia inferior to the solid mass measures 4.2 x 2.6 cm and demonstrates a small amount of peripheral linear and nodular enhancement (series 15, image 116). A 1.7 cm hemorrhagic right frontal lobe mass anterior to the insula has enlarged from the prior MRI and now clearly enhances (series 15, image 90). A 1.2 cm enhancing mass in the anteromedial left frontal lobe is new or larger compared to the prior MRI (series 15, image 89). A 2.6 x 2.6 cm hemorrhagic lateral left frontal lobe mass has enlarged from the prior MRI and now also clearly has solid enhancing components. A 6 mm ring-enhancing inferior right frontal lesion is new (series 15, image 79). Motion artifact could obscure additional small enhancing lesions. There is moderate vasogenic edema associated with the lateral left frontal mass with milder edema associated with other bilateral frontal masses. As seen on the recent head CT, there is a small amount of blood in the occipital horns of the lateral ventricles. There is also trace subarachnoid hemorrhage posteriorly in the sylvian fissures. There is not a significant chronic microhemorrhage burden to suggest cerebral amyloid angiopathy. There is moderate cerebral atrophy, and there is ex vacuo dilatation of the right frontal horn. There is no evidence of acute infarct, midline shift, or extra-axial fluid collection. Vascular: Major intracranial vascular flow voids are preserved. Skull and upper  cervical spine: Bilateral burr holes related to prior subdural hematoma evacuation. No suspicious marrow lesion. Sinuses/Orbits: Bilateral cataract extraction. Small bilateral maxillary sinus mucous retention cysts. Small bilateral mastoid effusions. Other: None. IMPRESSION: 1. Multiple brain masses, many of which are hemorrhagic and now clearly demonstrate enhancement. These are consistent with metastases. 2. Up to moderate associated vasogenic edema.  No midline shift. 3. Small volume intraventricular and subarachnoid hemorrhage. Electronically Signed   By: Logan Bores M.D.   On: 01/23/2018 17:51   Ct Abdomen Pelvis W Contrast  Result Date: 01/24/2018 CLINICAL DATA:  Evaluate for metastatic disease. Initial cancer workup. EXAM: CT CHEST, ABDOMEN, AND PELVIS WITH CONTRAST TECHNIQUE: Multidetector CT imaging of the chest, abdomen and pelvis was performed following the standard protocol during bolus administration of intravenous contrast. CONTRAST:  123mL OMNIPAQUE IOHEXOL 300 MG/ML  SOLN COMPARISON:  None. FINDINGS: CT CHEST FINDINGS Cardiovascular: Heart size appears within normal limits. No pericardial effusion identified. Aortic atherosclerosis. Calcifications within the LAD and RCA coronary arteries noted. Mediastinum/Nodes: Normal appearance of the thyroid gland. The trachea appears patent and is midline. No axillary or supraclavicular adenopathy. Within the left posterior mediastinum there is a lymph node measuring 1.4 cm, image 37/3. Lungs/Pleura: No pleural effusion, airspace consolidation or atelectasis. Pulmonary nodule within the posterior costophrenic sulcus measures 9 mm, image 124/5. Also within the posterior left costophrenic sulcus is a 8 mm lung nodule, image 115/5. Musculoskeletal: No chest wall mass or suspicious bone lesions identified. CT ABDOMEN PELVIS FINDINGS Hepatobiliary: No focal liver abnormality is seen. No gallstones, gallbladder wall thickening, or biliary  dilatation. Pancreas:  Cystic lesion arising from the tail of pancreas measures 1.9 by 2.2 cm, image 52/3. No inflammation or main duct dilatation. Spleen: Spleen is normal. Adrenals/Urinary Tract: Normal appearance of the right adrenal gland. Left adrenal nodule measures 4.4 by 3.3 by 5.0 cm and measures 46 HU. Normal appearance of the right kidney. Multiple exophytic cysts are noted arising from the upper and lower pole of left kidney. Indeterminate exophytic lesion arising from the anterior cortex of the interpolar left kidney measures 1 cm and 49 HU, image 64/3. No hydronephrosis identified bilaterally. No focal bladder lesion identified. Stomach/Bowel: Stomach appears normal. The small bowel loops have a normal course and caliber. No pathologic dilatation of the colon. Distal colonic diverticula noted without acute inflammation. Vascular/Lymphatic: Aortic atherosclerosis. No aneurysm. No abdominopelvic adenopathy. Reproductive: Prostate is unremarkable. Other: No abdominal wall hernia or abnormality. No abdominopelvic ascites. Musculoskeletal: Spondylosis noted within the lumbar spine. No aggressive lytic or sclerotic bone lesion. IMPRESSION: 1. No definite primary neoplasm identified. Several nonspecific findings are noted within the chest in abdomen including a 5 cm left adrenal mass. This may represent a benign or malignant tumor. Metastatic disease not excluded. More definitive characterization could be obtained with contrast enhanced abdominal MRI. 2. Small hyperdense left kidney lesion measures 1 cm and 49 HU. This may represent either and small enhancing neoplasm or hemorrhagic/proteinaceous cyst. This could also be further characterized with contrast enhanced abdominal MRI. 3. Single enlarged left posterior mediastinal lymph node measuring 1.4 cm. 4. Small exophytic cystic lesion arising from tail of pancreas measures 2.2 cm. Further characterization with contrast enhanced MR of the abdomen is advised. 5. 2 small nodules  noted within the posterior costophrenic sulcus within the left lower lobe, nonspecific. 6. As an alternative to further imaging with contrast enhanced abdominal MRI a PET-CT may be considered to assess for areas of hypermetabolism which may help guide biopsy of any suspicious hypermetabolic lesions. Electronically Signed   By: Kerby Moors M.D.   On: 01/24/2018 21:32        Scheduled Meds: . atorvastatin  10 mg Oral Daily  . dexamethasone  4 mg Oral Q12H  . diltiazem  360 mg Oral Daily  . flecainide  100 mg Oral BID  . levothyroxine  100 mcg Oral QAC breakfast  . lisinopril  20 mg Oral Daily  . pantoprazole  40 mg Oral Daily  . senna-docusate  1 tablet Oral BID  . sodium chloride flush  3 mL Intravenous Q12H  . sodium chloride flush  3 mL Intravenous Q12H  . topiramate  50 mg Oral BID   Continuous Infusions: . sodium chloride       LOS: 2 days    Time spent: 35 mins.More than 50% of that time was spent in counseling and/or coordination of care.      Shelly Coss, MD [facility] Pager [phone]  If 7PM-7AM, please contact night-coverage [email or web address] Password [email or web address] 01/25/2018, 11:45 AM

## 2018-01-26 ENCOUNTER — Inpatient Hospital Stay (HOSPITAL_COMMUNITY): Payer: PPO

## 2018-01-26 MED ORDER — GADOBUTROL 1 MMOL/ML IV SOLN
10.0000 mL | Freq: Once | INTRAVENOUS | Status: AC | PRN
  Administered 2018-01-26: 10 mL via INTRAVENOUS

## 2018-01-26 NOTE — Progress Notes (Addendum)
PROGRESS NOTE    [name] [name]  QMG:[id number] DOB: September 17, 1940 DOA: 01/22/2018 PCP: Dettinger, [name], MD   Brief Narrative: Patient is a 77 year old man with past medical history of A. fib not on anti-Coblation, subdural hemorrhage, hypertension, hyperlipidemia, hypothyroidism who presented with 2 weeks history of headache. Mri brain showed intracranial hemorrhage with multiple hemorrhagic masses with localized edema but no midline shift.  There was also presence of small intraventricular hemorrhage,subarachnoid hemorrhage. Oncology consulted for the possible metastatic process.Started on decadron. We consulted radiation oncology and neuro-oncology today.  MRI of the abdomen with and without contrast ordered for further evaluation of adrenal mass.   Assessment & Plan:   Principal Problem:   ICH (intracerebral hemorrhage) (HCC) Active Problems:   Hypothyroidism   Essential hypertension   Paroxysmal atrial fibrillation (HCC)   Headache   Precancerous skin lesion   Brain metastases (HCC)   Lesion of adrenal gland (HCC)  Multifocal intracerebral hemorrhage with surrounding vasogenic cerebral edema: Looks like  hemorrhagic metastatic dieaase.  Origin is unknown at this time. Oncology was consulted and was recommended to get a CT chest/abdomen/pelvis which showed multiple abnormalities suggesting metastatic disease but no primary.Also showed 5 cm left adrenal mass and lymph node enlargements. We consulted radiation oncology The patient left the office before the visit was finished. Left message to Dr. Vaslow,neuro-oncology today,waiting to get the answer.  MRI of the abdomen with and without contrast ordered for further evaluation of adrenal mass.We will decide on brain biopsy versus adrenal biopsy after MRI and input from oncology team.  Headache: Much better now.  Continue dexamethasone and Topamax.  A. fib: On Cardizem and flecainide.  Not on anticoagulation.  Rate  controlled.  Hypertension: Continue Cardizem, lisinopril.  Hypothyroidism: Continue Synthyroid  Hyperlipidemia: Continue statin  Deconditioning/debility: Patient evaluated by physical therapy and recommended home health on discharge.   DVT prophylaxis: SCD Code Status: Full Family Communication:  Disposition Plan: pending workup.  Consultants: Oncology  Procedures:None  Antimicrobials:None  Subjective: Patient seen and examined the bedside this afternoon.  Remains comfortable.  No active issues.  No complaints.    Objective: Vitals:   01/25/18 1723 01/25/18 1938 01/25/18 2310 01/26/18 0744  BP: (!) 112/58 117/60 (!) 104/56   Pulse: (!) 58 67 (!) 58   Resp: 15 19 13    Temp: 97.9 F (36.6 C) 97.8 F (36.6 C)  (!) 97.5 F (36.4 C)  TempSrc: Oral Oral  Oral  SpO2: 100% 99% 97%   Weight:      Height:        Intake/Output Summary (Last 24 hours) at 01/26/2018 1121 Last data filed at 01/25/2018 1759 Gross per 24 hour  Intake 480 ml  Output 400 ml  Net 80 ml   Filed Weights   01/22/18 2256  Weight: 124.7 kg    Examination:  General exam: Appears calm and comfortable ,Not in distress,average built HEENT:left eye blindness,Oral mucosa moist, Ear/Nose normal on gross exam Respiratory system: Bilateral equal air entry, normal vesicular breath sounds, no wheezes or crackles  Cardiovascular system: S1 & S2 heard, RRR. No JVD, murmurs, rubs, gallops or clicks. No pedal edema. Gastrointestinal system: Abdomen is nondistended, soft and nontender. No organomegaly or masses felt. Normal bowel sounds heard. Central nervous system: Alert and oriented. No focal neurological deficits. Extremities: No edema, no clubbing ,no cyanosis, distal peripheral pulses palpable. 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 Mood &  affect appropriate.     Data Reviewed: I have  personally reviewed following labs and imaging studies  CBC: Recent Labs  Lab 01/22/18 2358 01/24/18 0533 01/25/18 0456  WBC 11.3* 9.1 6.0  NEUTROABS 7.3  --   --   HGB 14.4 15.2 15.7  HCT 44.3 47.1 48.5  MCV 89.0 87.9 87.9  PLT 242 262 474   Basic Metabolic Panel: Recent Labs  Lab 01/22/18 2358 01/24/18 0533 01/25/18 0456  NA 135 134* 133*  K 3.9 3.9 4.1  CL 101 101 100  CO2 23 23 20*  GLUCOSE 110* 104* 183*  BUN 13 12 12   CREATININE 0.97 0.87 1.13  CALCIUM 9.1 9.1 9.4   GFR: Estimated Creatinine Clearance: 73.7 mL/min (by C-G formula based on SCr of 1.13 mg/dL). Liver Function Tests: Recent Labs  Lab 01/22/18 2358  AST 27  ALT 36  ALKPHOS 66  BILITOT 0.9  PROT 7.7  ALBUMIN 4.3   No results for input(s): LIPASE, AMYLASE in the last 168 hours. No results for input(s): AMMONIA in the last 168 hours. Coagulation Profile: Recent Labs  Lab 01/22/18 2358  INR 1.00   Cardiac Enzymes: No results for input(s): CKTOTAL, CKMB, CKMBINDEX, TROPONINI in the last 168 hours. BNP (last 3 results) No results for input(s): PROBNP in the last 8760 hours. HbA1C: No results for input(s): HGBA1C in the last 72 hours. CBG: No results for input(s): GLUCAP in the last 168 hours. Lipid Profile: No results for input(s): CHOL, HDL, LDLCALC, TRIG, CHOLHDL, LDLDIRECT in the last 72 hours. Thyroid Function Tests: No results for input(s): TSH, T4TOTAL, FREET4, T3FREE, THYROIDAB in the last 72 hours. Anemia Panel: No results for input(s): VITAMINB12, FOLATE, FERRITIN, TIBC, IRON, RETICCTPCT in the last 72 hours. Sepsis Labs: No results for input(s): PROCALCITON, LATICACIDVEN in the last 168 hours.  Recent Results (from the past 240 hour(s))  MRSA PCR Screening     Status: None   Collection Time: 01/23/18  2:25 PM  Result Value Ref Range Status   MRSA by PCR NEGATIVE NEGATIVE Final    Comment:        The GeneXpert MRSA Assay (FDA approved for NASAL specimens only), is one  component of a comprehensive MRSA colonization surveillance program. It is not intended to diagnose MRSA infection nor to guide or monitor treatment for MRSA infections. Performed at [facility], [place] [address] [place],  [place] [address]          Radiology Studies: Ct Chest W Contrast  Result Date: 01/24/2018 CLINICAL DATA:  Evaluate for metastatic disease. Initial cancer workup. EXAM: CT CHEST, ABDOMEN, AND PELVIS WITH CONTRAST TECHNIQUE: Multidetector CT imaging of the chest, abdomen and pelvis was performed following the standard protocol during bolus administration of intravenous contrast. CONTRAST:  165mL OMNIPAQUE IOHEXOL 300 MG/ML  SOLN COMPARISON:  None. FINDINGS: CT CHEST FINDINGS Cardiovascular: Heart size appears within normal limits. No pericardial effusion identified. Aortic atherosclerosis. Calcifications within the LAD and RCA coronary arteries noted. Mediastinum/Nodes: Normal appearance of the thyroid gland. The trachea appears patent and is midline. No axillary or supraclavicular adenopathy. Within the left posterior mediastinum there is a lymph node measuring 1.4 cm, image 37/3. Lungs/Pleura: No pleural effusion, airspace consolidation or atelectasis. Pulmonary nodule within the posterior costophrenic sulcus measures 9 mm, image 124/5. Also within the posterior left costophrenic sulcus is a 8 mm lung nodule, image 115/5. Musculoskeletal: No chest wall mass or suspicious bone lesions identified. CT ABDOMEN PELVIS FINDINGS Hepatobiliary: No focal liver  abnormality is seen. No gallstones, gallbladder wall thickening, or biliary dilatation. Pancreas: Cystic lesion arising from the tail of pancreas measures 1.9 by 2.2 cm, image 52/3. No inflammation or main duct dilatation. Spleen: Spleen is normal. Adrenals/Urinary Tract: Normal appearance of the right adrenal gland. Left adrenal nodule measures 4.4 by 3.3 by 5.0 cm and measures 46 HU. Normal appearance of the right kidney.  Multiple exophytic cysts are noted arising from the upper and lower pole of left kidney. Indeterminate exophytic lesion arising from the anterior cortex of the interpolar left kidney measures 1 cm and 49 HU, image 64/3. No hydronephrosis identified bilaterally. No focal bladder lesion identified. Stomach/Bowel: Stomach appears normal. The small bowel loops have a normal course and caliber. No pathologic dilatation of the colon. Distal colonic diverticula noted without acute inflammation. Vascular/Lymphatic: Aortic atherosclerosis. No aneurysm. No abdominopelvic adenopathy. Reproductive: Prostate is unremarkable. Other: No abdominal wall hernia or abnormality. No abdominopelvic ascites. Musculoskeletal: Spondylosis noted within the lumbar spine. No aggressive lytic or sclerotic bone lesion. IMPRESSION: 1. No definite primary neoplasm identified. Several nonspecific findings are noted within the chest in abdomen including a 5 cm left adrenal mass. This may represent a benign or malignant tumor. Metastatic disease not excluded. More definitive characterization could be obtained with contrast enhanced abdominal MRI. 2. Small hyperdense left kidney lesion measures 1 cm and 49 HU. This may represent either and small enhancing neoplasm or hemorrhagic/proteinaceous cyst. This could also be further characterized with contrast enhanced abdominal MRI. 3. Single enlarged left posterior mediastinal lymph node measuring 1.4 cm. 4. Small exophytic cystic lesion arising from tail of pancreas measures 2.2 cm. Further characterization with contrast enhanced MR of the abdomen is advised. 5. 2 small nodules noted within the posterior costophrenic sulcus within the left lower lobe, nonspecific. 6. As an alternative to further imaging with contrast enhanced abdominal MRI a PET-CT may be considered to assess for areas of hypermetabolism which may help guide biopsy of any suspicious hypermetabolic lesions. Electronically Signed   By:  Kerby Moors M.D.   On: 01/24/2018 21:32   Ct Abdomen Pelvis W Contrast  Result Date: 01/24/2018 CLINICAL DATA:  Evaluate for metastatic disease. Initial cancer workup. EXAM: CT CHEST, ABDOMEN, AND PELVIS WITH CONTRAST TECHNIQUE: Multidetector CT imaging of the chest, abdomen and pelvis was performed following the standard protocol during bolus administration of intravenous contrast. CONTRAST:  175mL OMNIPAQUE IOHEXOL 300 MG/ML  SOLN COMPARISON:  None. FINDINGS: CT CHEST FINDINGS Cardiovascular: Heart size appears within normal limits. No pericardial effusion identified. Aortic atherosclerosis. Calcifications within the LAD and RCA coronary arteries noted. Mediastinum/Nodes: Normal appearance of the thyroid gland. The trachea appears patent and is midline. No axillary or supraclavicular adenopathy. Within the left posterior mediastinum there is a lymph node measuring 1.4 cm, image 37/3. Lungs/Pleura: No pleural effusion, airspace consolidation or atelectasis. Pulmonary nodule within the posterior costophrenic sulcus measures 9 mm, image 124/5. Also within the posterior left costophrenic sulcus is a 8 mm lung nodule, image 115/5. Musculoskeletal: No chest wall mass or suspicious bone lesions identified. CT ABDOMEN PELVIS FINDINGS Hepatobiliary: No focal liver abnormality is seen. No gallstones, gallbladder wall thickening, or biliary dilatation. Pancreas: Cystic lesion arising from the tail of pancreas measures 1.9 by 2.2 cm, image 52/3. No inflammation or main duct dilatation. Spleen: Spleen is normal. Adrenals/Urinary Tract: Normal appearance of the right adrenal gland. Left adrenal nodule measures 4.4 by 3.3 by 5.0 cm and measures 46 HU. Normal appearance of the right kidney. Multiple exophytic cysts  are noted arising from the upper and lower pole of left kidney. Indeterminate exophytic lesion arising from the anterior cortex of the interpolar left kidney measures 1 cm and 49 HU, image 64/3. No  hydronephrosis identified bilaterally. No focal bladder lesion identified. Stomach/Bowel: Stomach appears normal. The small bowel loops have a normal course and caliber. No pathologic dilatation of the colon. Distal colonic diverticula noted without acute inflammation. Vascular/Lymphatic: Aortic atherosclerosis. No aneurysm. No abdominopelvic adenopathy. Reproductive: Prostate is unremarkable. Other: No abdominal wall hernia or abnormality. No abdominopelvic ascites. Musculoskeletal: Spondylosis noted within the lumbar spine. No aggressive lytic or sclerotic bone lesion. IMPRESSION: 1. No definite primary neoplasm identified. Several nonspecific findings are noted within the chest in abdomen including a 5 cm left adrenal mass. This may represent a benign or malignant tumor. Metastatic disease not excluded. More definitive characterization could be obtained with contrast enhanced abdominal MRI. 2. Small hyperdense left kidney lesion measures 1 cm and 49 HU. This may represent either and small enhancing neoplasm or hemorrhagic/proteinaceous cyst. This could also be further characterized with contrast enhanced abdominal MRI. 3. Single enlarged left posterior mediastinal lymph node measuring 1.4 cm. 4. Small exophytic cystic lesion arising from tail of pancreas measures 2.2 cm. Further characterization with contrast enhanced MR of the abdomen is advised. 5. 2 small nodules noted within the posterior costophrenic sulcus within the left lower lobe, nonspecific. 6. As an alternative to further imaging with contrast enhanced abdominal MRI a PET-CT may be considered to assess for areas of hypermetabolism which may help guide biopsy of any suspicious hypermetabolic lesions. Electronically Signed   By: Kerby Moors M.D.   On: 01/24/2018 21:32        Scheduled Meds: . atorvastatin  10 mg Oral Daily  . dexamethasone  4 mg Oral Q12H  . diltiazem  360 mg Oral Daily  . flecainide  100 mg Oral BID  . levothyroxine  100  mcg Oral QAC breakfast  . lisinopril  20 mg Oral Daily  . pantoprazole  40 mg Oral Daily  . senna-docusate  1 tablet Oral BID  . sodium chloride flush  3 mL Intravenous Q12H  . sodium chloride flush  3 mL Intravenous Q12H  . topiramate  50 mg Oral BID   Continuous Infusions: . sodium chloride       LOS: 3 days    Time spent: 35 mins.More than 50% of that time was spent in counseling and/or coordination of care.      Shelly Coss, MD [facility] Pager [phone]  If 7PM-7AM, please contact night-coverage [email or web address] Password [email or web address] 01/26/2018, 11:21 AM

## 2018-01-26 NOTE — Progress Notes (Signed)
Patient ID: [name] [name], male   DOB: February 06, 1941, 77 y.o.   MRN: [id number] Is awake and alert though somewhat confused His wife is at bedside I explained to him the plan to discuss his situation and his scans in the brain tumor conference tomorrow and try to formulate a plan is to which would be best to pursue either adrenal biopsy or brain biopsy of 1 of the lesions in his head.  I will let Dr. Venetia Constable know.  Reassured patient's family the plan is in the making.

## 2018-01-27 ENCOUNTER — Encounter: Payer: Self-pay | Admitting: Radiation Therapy

## 2018-01-27 NOTE — Progress Notes (Signed)
[name] [name] was discussed this morning during our Multidisciplinary brain conference. The recommendation was for this patient to have a biopsy of his adrenal met to determine pathology prior to being seen in [place]. Dr. Mickeal Skinner, neuro-oncologist, plans to stop by and see this patient today.  Mont Dutton R.T.(R)(T)

## 2018-01-27 NOTE — Progress Notes (Signed)
PROGRESS NOTE    [name] [name]  ZOX:[id number] DOB: [birth year] DOA: 01/22/2018 PCP: Dettinger, [name], MD   Brief Narrative: Patient is a 76 year old man with past medical history of A. fib not on anti-coagulation, subdural hemorrhage, hypertension, hyperlipidemia, hypothyroidism who presented with 2 weeks history of headache. MRI brain showed intracranial hemorrhage with multiple hemorrhagic masses with localized edema but no midline shift.  There was also presence of small intraventricular hemorrhage,subarachnoid hemorrhage. Oncology consulted for the possible metastatic process.Started on decadron. We have consulted radiation oncology and neuro-oncology .  MRI of the abdomen with and without contrast ordered for further evaluation of adrenal mass.   Assessment & Plan:   Principal Problem:   ICH (intracerebral hemorrhage) (HCC) Active Problems:   Hypothyroidism   Essential hypertension   Paroxysmal atrial fibrillation (HCC)   Headache   Precancerous skin lesion   Brain metastases (HCC)   Lesion of adrenal gland (HCC)  Multifocal intracerebral hemorrhage with surrounding vasogenic cerebral edema: Looks like  hemorrhagic metastatic diesase.  Origin is unknown at this time. Oncology was consulted and was recommended to get a CT chest/abdomen/pelvis which showed multiple abnormalities suggesting metastatic disease but no primary.Also showed 5 cm left adrenal mass and lymph node enlargements. We consulted radiation oncology ,neuro Oncology Dr Mickeal Skinner. MRI of the abdomen with and without contrast ordered for further evaluation of adrenal mass which showed 4.6 cm left renal mass and 2.4 cm anterior paratracheal mass .cannot exclude malignancy .Plan for brain biopsy versus adrenal biopsy as per oncology.  Headache: Much better now.  Continue dexamethasone D/Ced Topamax.  A. fib: Was On Cardizem and flecainide.  Not on anticoagulation.  Rate controlled.  Hypertension: Noted to be  hypotensive this morning.  Discontinued Cardizem and lisinopril.  Hypothyroidism: Continue Synthyroid  Hyperlipidemia: Continue statin  Deconditioning/debility: Patient evaluated by physical therapy and recommended home health on discharge.   DVT prophylaxis: SCD Code Status: Full Family Communication:  Disposition Plan: pending workup.  Consultants: Oncology  Procedures:None  Antimicrobials:None  Subjective: Patient seen and examined the bedside this afternoon.  Remains comfortable.  Slightly hypotensive this morning.  No complaints.    Objective: Vitals:   01/26/18 2345 01/27/18 0431 01/27/18 0759 01/27/18 1143  BP: (!) 106/42 111/60 (!) 96/46 113/61  Pulse: (!) 57 (!) 57 (!) 45 60  Resp: 18 17 18 18   Temp:   98.5 F (36.9 C)   TempSrc:   Oral   SpO2: 96% 96% 98% 99%  Weight:      Height:       No intake or output data in the 24 hours ending 01/27/18 1159 Filed Weights   01/22/18 2256  Weight: 124.7 kg    Examination:  General exam: Appears calm and comfortable ,Not in distress,average built HEENT:left eye blindness,Oral mucosa moist, Ear/Nose normal on gross exam Respiratory system: Bilateral equal air entry, normal vesicular breath sounds, no wheezes or crackles  Cardiovascular system: S1 & S2 heard, RRR. No JVD, murmurs, rubs, gallops or clicks. No pedal edema. Gastrointestinal system: Abdomen is nondistended, soft and nontender. No organomegaly or masses felt. Normal bowel sounds heard. Central nervous system: Alert and oriented. No focal neurological deficits. Extremities: No edema, no clubbing ,no cyanosis, distal peripheral pulses palpable. 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 Mood & affect appropriate.     Data Reviewed: I have personally reviewed following labs and imaging studies  CBC: Recent Labs  Lab 01/22/18 2358  01/24/18 0533 01/25/18 0456  WBC 11.3* 9.1 6.0  NEUTROABS 7.3  --   --   HGB 14.4 15.2 15.7  HCT 44.3 47.1 48.5  MCV 89.0 87.9 87.9  PLT 242 262 102   Basic Metabolic Panel: Recent Labs  Lab 01/22/18 2358 01/24/18 0533 01/25/18 0456  NA 135 134* 133*  K 3.9 3.9 4.1  CL 101 101 100  CO2 23 23 20*  GLUCOSE 110* 104* 183*  BUN 13 12 12   CREATININE 0.97 0.87 1.13  CALCIUM 9.1 9.1 9.4   GFR: Estimated Creatinine Clearance: 73.7 mL/min (by C-G formula based on SCr of 1.13 mg/dL). Liver Function Tests: Recent Labs  Lab 01/22/18 2358  AST 27  ALT 36  ALKPHOS 66  BILITOT 0.9  PROT 7.7  ALBUMIN 4.3   No results for input(s): LIPASE, AMYLASE in the last 168 hours. No results for input(s): AMMONIA in the last 168 hours. Coagulation Profile: Recent Labs  Lab 01/22/18 2358  INR 1.00   Cardiac Enzymes: No results for input(s): CKTOTAL, CKMB, CKMBINDEX, TROPONINI in the last 168 hours. BNP (last 3 results) No results for input(s): PROBNP in the last 8760 hours. HbA1C: No results for input(s): HGBA1C in the last 72 hours. CBG: No results for input(s): GLUCAP in the last 168 hours. Lipid Profile: No results for input(s): CHOL, HDL, LDLCALC, TRIG, CHOLHDL, LDLDIRECT in the last 72 hours. Thyroid Function Tests: No results for input(s): TSH, T4TOTAL, FREET4, T3FREE, THYROIDAB in the last 72 hours. Anemia Panel: No results for input(s): VITAMINB12, FOLATE, FERRITIN, TIBC, IRON, RETICCTPCT in the last 72 hours. Sepsis Labs: No results for input(s): PROCALCITON, LATICACIDVEN in the last 168 hours.  Recent Results (from the past 240 hour(s))  MRSA PCR Screening     Status: None   Collection Time: 01/23/18  2:25 PM  Result Value Ref Range Status   MRSA by PCR NEGATIVE NEGATIVE Final    Comment:        The GeneXpert MRSA Assay (FDA approved for NASAL specimens only), is one component of a comprehensive MRSA colonization surveillance program. It is not intended to  diagnose MRSA infection nor to guide or monitor treatment for MRSA infections. Performed at [facility], [place] [address] [place], [place] [address]          Radiology Studies: Mr Abdomen W Wo Contrast  Result Date: 01/26/2018 CLINICAL DATA:  Inpatient. Multiple enhancing hemorrhagic bilateral cerebral masses compatible with metastatic disease of uncertain primary. Indeterminate left adrenal mass, left renal mass and cystic pancreatic tail mass on recent CT abdomen study. EXAM: MRI ABDOMEN WITHOUT AND WITH CONTRAST TECHNIQUE: Multiplanar multisequence MR imaging of the abdomen was performed both before and after the administration of intravenous contrast. CONTRAST:  10 cc Gadavist IV. COMPARISON:  01/24/2018 CT chest, abdomen and pelvis. FINDINGS: Significantly motion degraded scan, limiting assessment. Lower chest: Enlarged 1.4 cm left lower posterior mediastinal para-aortic node (series 5/image 4), unchanged from 01/24/2018 CT. Hepatobiliary: Normal liver size and configuration. Mild diffuse hepatic steatosis. No liver mass. Normal gallbladder with no cholelithiasis. No biliary ductal dilatation. Common bile duct diameter 3 mm. No choledocholithiasis. Pancreas: There is a unilocular cystic pancreatic lesion measuring 2.4 x 1.8 x 2.0 cm arising exophytically from the anterior pancreatic tail (series 5/image 12), which demonstrates mild wall thickening and enhancement along the posterior margin, with no internal septations. Subcentimeter simple unilocular nonenhancing pancreatic body cystic lesion. No pancreatic duct dilation. No pancreas divisum. Spleen: Normal  size. No mass. Adrenals/Urinary Tract: Normal right adrenal. Left adrenal 4.6 x 3.3 cm mass (series 8/image 18) demonstrates mildly heterogeneous signal intensity on T1 and T2 weighted sequences with heterogeneous enhancement and absence of consistent loss of signal intensity on out of phase chemical shift imaging. No hydronephrosis.  Several simple renal cysts in both kidneys, largest 2.7 cm in the interpolar left kidney. There is a 1.4 x 1.1 cm mildly T2 hyperintense and T1 isointense renal cortical lesion in the anterior interpolar left kidney (series 8/image 22) without convincing enhancement on the limited motion degraded postcontrast sequences, probably a Bosniak category 2 hemorrhagic/proteinaceous renal cyst. No overtly suspicious renal masses. Stomach/Bowel: Normal non-distended stomach. Visualized small and large bowel is normal caliber, with no bowel wall thickening. Vascular/Lymphatic: Normal caliber abdominal aorta. Patent portal, splenic, hepatic and renal veins. No pathologically enlarged lymph nodes in the abdomen. Other: No abdominal ascites or focal fluid collection. Musculoskeletal: No aggressive appearing focal osseous lesions. IMPRESSION: 1. Limited motion degraded scan. 2. Heterogeneous enhancing 4.6 cm left adrenal mass remains indeterminate by MRI, with no convincing intralesional lipid. Left adrenal metastasis cannot be excluded. Tissue sampling may be considered. 3. No overtly suspicious renal masses. Probable 1.4 cm Bosniak category 2 hemorrhagic/proteinaceous renal cyst in the anterior interpolar left kidney, for which follow-up MRI abdomen without and with IV contrast is recommended in 6-12 months given the limitations of this motion degraded scan. 4. 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 No pancreatic duct dilation. GI consultation for consideration of EUS/FNA is indicated. This recommendation follows ACR consensus guidelines: Management of Incidental Pancreatic Cysts: A White Paper of the ACR Incidental Findings Committee. [place] [id number]. 5. Redemonstration of nonspecific mild left lower mediastinal adenopathy. 6. Mild diffuse hepatic steatosis. Electronically Signed   By: Ilona Sorrel M.D.    On: 01/26/2018 18:09        Scheduled Meds: . atorvastatin  10 mg Oral Daily  . dexamethasone  4 mg Oral Q12H  . flecainide  100 mg Oral BID  . levothyroxine  100 mcg Oral QAC breakfast  . pantoprazole  40 mg Oral Daily  . senna-docusate  1 tablet Oral BID  . sodium chloride flush  3 mL Intravenous Q12H  . sodium chloride flush  3 mL Intravenous Q12H   Continuous Infusions: . sodium chloride       LOS: 4 days    Time spent: 35 mins.More than 50% of that time was spent in counseling and/or coordination of care.      Shelly Coss, MD [facility] Pager [phone]  If 7PM-7AM, please contact night-coverage [email or web address] Password [email or web address] 01/27/2018, 11:59 AM

## 2018-01-27 NOTE — Consult Note (Signed)
[facility] CONSULT NOTE  Patient Care Team: Dettinger, [name], MD as PCP - General (Family Medicine) Rutherford Guys, MD as Consulting Physician (Ophthalmology) Stanford Breed Denice Bors, MD as Consulting Physician (Cardiology) Rogene Houston, MD as Consulting Physician (Gastroenterology) Gaynelle Arabian, MD as Consulting Physician (Orthopedic Surgery) Celene Squibb, MD as Consulting Physician (Dermatology) Dettinger, [name], MD as Consulting Physician (Family Medicine)  CHIEF COMPLAINTS/PURPOSE OF CONSULTATION:  Brain Tumors Headache  HISTORY OF PRESENTING ILLNESS:  [name] [name] 77 y.o. male presented with several days history of progressive headache.  Pain described as holocranial and worse in the AM.  Not associated with weakness, numbness, speech difficulty, seizures.  Previous ICH and subdural hematoma s/p evacuation.  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  Presently he is asymptomatic and headache free.  MEDICAL HISTORY:  Past Medical History:  Diagnosis Date  . Arthritis   . Atrial fibrillation ([place])    previously on Coumadin, reversed with head bleed  . Cataract   . Hyperlipidemia   . HYPERTENSION   . HYPOTHYROIDISM   . 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 Pre-diabetes   . Precancerous skin lesion    on back  . ROTATOR CUFF TEAR   . Subdural hematoma ([facility])     SURGICAL HISTORY: Past Surgical History:  Procedure Laterality Date  . APPENDECTOMY    . CATARACT EXTRACTION W/PHACO Right 02/02/2014   Procedure: CATARACT EXTRACTION PHACO AND INTRAOCULAR LENS PLACEMENT (IOC);  Surgeon: Elta Guadeloupe T. Gershon Crane, MD;  Location: [facility];  Service: Ophthalmology;  Laterality: Right;  CDE 12.17  . CATARACT EXTRACTION W/PHACO Left 02/16/2014   Procedure: CATARACT EXTRACTION PHACO AND INTRAOCULAR LENS PLACEMENT ;  Surgeon: Elta Guadeloupe T. Gershon Crane, MD;  Location: [facility];  Service: Ophthalmology;  Laterality: Left;   CDE:13.42  . CRANIOTOMY N/A 02/05/2017   Procedure: Bilateral Burr Holes . Craniotomy for Subdural;  Surgeon: Ditty, Kevan Ny, MD;  Location[facility];  Service: Neurosurgery;  Laterality: N/A;  . EYE SURGERY  2015   cataract surgery. bilateral  . JOINT REPLACEMENT  2009   bilateral knee replacement  . KNEE SURGERY    . ROTATOR CUFF REPAIR Left   . SHOULDER SURGERY    . TOTAL KNEE ARTHROPLASTY Bilateral     SOCIAL HISTORY: Social History   Socioeconomic History  . Marital status: Married    Spouse name: Not on file  . Number of children: 2  . Years of education: Not on file  . Highest education level: Not on file  Occupational History  . Not on file  Social Needs  . Financial resource strain: Not hard at all  . Food insecurity:    Worry: Never true    Inability: Never true  . Transportation needs:    Medical: No    Non-medical: No  Tobacco Use  . Smoking status: Never Smoker  . Smokeless tobacco: Never Used  Substance and Sexual Activity  . Alcohol use: No    Frequency: Never    Comment: remote use, h/o heavy binge use about 25 years ago  . Drug use: No    Comment: remote h/o marijuana use  . Sexual activity: Never    Birth control/protection: None  Lifestyle  . Physical activity:    Days per week: 0 days    Minutes per session: 0 min  . Stress: Not at all  Relationships  . Social connections:  Talks on phone: More than three times a week    Gets together: More than three times a week    Attends religious service: More than 4 times per year    Active member of club or organization: Yes    Attends meetings of clubs or organizations: More than 4 times per year    Relationship status: Married  . Intimate partner violence:    Fear of current or ex partner: Not on file    Emotionally abused: Not on file    Physically abused: Not on file    Forced sexual activity: Not on file  Other Topics Concern  . Not on file  Social History Narrative        FAMILY  HISTORY: Family History  Problem Relation Age of Onset  . Heart attack Brother   . Early death Brother   . Asthma Father   . Vision loss Father   . Hypertension Mother   . Heart attack Brother 70  . Arthritis Brother     ALLERGIES:  has No Known Allergies.  MEDICATIONS:  Current Facility-Administered Medications  Medication Dose Route Frequency Provider Last Rate Last Dose  . 0.9 %  sodium chloride infusion  250 mL Intravenous PRN Samuella Cota, MD      . acetaminophen (TYLENOL) tablet 650 mg  650 mg Oral Q6H PRN Samuella Cota, MD   650 mg at 01/24/18 1408  . atorvastatin (LIPITOR) tablet 10 mg  10 mg Oral Daily Samuella Cota, MD   10 mg at 01/27/18 1324  . dexamethasone (DECADRON) tablet 4 mg  4 mg Oral Q12H Pokhrel, Laxman, MD   4 mg at 01/27/18 0918  . flecainide (TAMBOCOR) tablet 100 mg  100 mg Oral BID Samuella Cota, MD   100 mg at 01/27/18 4010  . labetalol (NORMODYNE,TRANDATE) injection 5 mg  5 mg Intravenous Q2H PRN Samuella Cota, MD      . levothyroxine (SYNTHROID, LEVOTHROID) tablet 100 mcg  100 mcg Oral QAC breakfast Samuella Cota, MD   100 mcg at 01/27/18 0523  . ondansetron (ZOFRAN) tablet 4 mg  4 mg Oral Q6H PRN Samuella Cota, MD       Or  . ondansetron [facility]) injection 4 mg  4 mg Intravenous Q6H PRN Samuella Cota, MD      . pantoprazole (PROTONIX) EC tablet 40 mg  40 mg Oral Daily Pokhrel, Laxman, MD   40 mg at 01/27/18 0917  . senna-docusate (Senokot-S) tablet 1 tablet  1 tablet Oral BID Samuella Cota, MD   1 tablet at 01/27/18 0919  . sodium chloride flush (NS) 0.9 % injection 3 mL  3 mL Intravenous Q12H Samuella Cota, MD   10 mL at 01/27/18 0920  . sodium chloride flush (NS) 0.9 % injection 3 mL  3 mL Intravenous Q12H Samuella Cota, MD   3 mL at 01/27/18 0919  . sodium chloride flush (NS) 0.9 % injection 3 mL  3 mL Intravenous PRN Samuella Cota, MD        REVIEW OF SYSTEMS:   Constitutional: Denies  fevers, chills or abnormal weight loss Eyes: Denies blurriness of vision Ears, nose, mouth, throat, and face: Denies mucositis or sore throat Respiratory: Denies cough, dyspnea or wheezes Cardiovascular: Denies palpitation, chest discomfort or lower extremity swelling Gastrointestinal:  Denies nausea, constipation, diarrhea GU: Denies dysuria or incontinence Skin: Denies abnormal skin rashes Neurological: Per HPI Musculoskeletal: Denies joint pain, back  or neck discomfort. No decrease in ROM Behavioral/Psych: Denies anxiety, disturbance in thought content, and mood instability   PHYSICAL EXAMINATION: Vitals:   01/27/18 1143 01/27/18 1528  BP: 113/61 (!) 114/47  Pulse: 60 63  Resp: 18   Temp:  98 F (36.7 C)  SpO2: 99% 97%   KPS: 80. General: Alert, cooperative, pleasant, in no acute distress Head: Normal EENT: No conjunctival injection or scleral icterus. Oral mucosa moist Lungs: Resp effort normal Cardiac: Regular rate and rhythm Abdomen: Soft, non-distended abdomen Skin: No rashes cyanosis or petechiae. Extremities: No clubbing or edema  NEUROLOGIC EXAM: Mental Status: Awake, alert, attentive to examiner. Oriented to self and environment. Language is fluent with intact comprehension.  Cranial Nerves: Visual acuity is impaired left eye. Visual fields are full. Extra-ocular movements intact. No ptosis. Face is symmetric, tongue midline. Motor: Tone and bulk are normal. Power is full in both arms and legs. Reflexes are symmetric, no pathologic reflexes present. Intact finger to nose bilaterally Sensory: Intact to light touch and temperature Gait: Deferred   LABORATORY DATA:  I have reviewed the data as listed Lab Results  Component Value Date   WBC 6.0 01/25/2018   HGB 15.7 01/25/2018   HCT 48.5 01/25/2018   MCV 87.9 01/25/2018   PLT 284 01/25/2018   Recent Labs    01/28/17 1425  05/13/17 1105  12/02/17 1628 01/22/18 2358 01/24/18 0533 01/25/18 0456  NA  --     < > 144   < > 135 135 134* 133*  K  --    < > 4.8   < > 4.0 3.9 3.9 4.1  CL  --    < > 104   < > 103 101 101 100  CO2  --    < > 24   < > 22 23 23  20*  GLUCOSE  --    < > 109*   < > 91 110* 104* 183*  BUN  --    < > 11   < > 13 13 12 12   CREATININE  --    < > 0.95   < > 0.88 0.97 0.87 1.13  CALCIUM  --    < > 9.1   < > 9.0 9.1 9.1 9.4  GFRNONAA  --    < > 79   < > >60 >60 >60 >60  GFRAA  --    < > 91   < > >60 >60 >60 >60  PROT 7.6   < > 6.7  --  6.9 7.7  --   --   ALBUMIN 4.0   < > 4.1  --  3.8 4.3  --   --   AST 19   < > 7  --  20 27  --   --   ALT 31   < > 20  --  25 36  --   --   ALKPHOS 56   < > 59  --  56 66  --   --   BILITOT 0.7   < > 0.3  --  0.4 0.9  --   --   BILIDIR 0.1  --   --   --   --   --   --   --   IBILI 0.6  --   --   --   --   --   --   --    < > = values in this interval not displayed.  RADIOGRAPHIC STUDIES: I have personally reviewed the radiological images as listed and agreed with the findings in the report. Ct Head Wo Contrast  Result Date: 01/24/2018 CLINICAL DATA:  Follow-up examination for acute stroke. EXAM: CT HEAD WITHOUT CONTRAST TECHNIQUE: Contiguous axial images were obtained from the base of the skull through the vertex without intravenous contrast. COMPARISON:  Previous CT from 01/22/2018 as well as brain MRI from 01/23/2018. FINDINGS: Brain: Multiple hemorrhagic brain masses again seen, relatively stable and unchanged in size as compared to most recent CT. Largest right frontal lobe mass measures 3.6 x 2.4 cm. Dominant left frontal lesion measures 2.7 x 2.8 cm. Mass at the left gyrus rectus measures 1.4 x 1.2 cm. Additional right frontal operculum lower lesion measures 1.5 x 1.5 cm. Surrounding vasogenic edema about several of these lesions, most notable at the left frontal lesion. No midline shift. Basilar cisterns remain patent. Cystic encephalomalacia at the adjacent right frontal lesion with is small amount of layering hemorrhage again noted.  Intraventricular extension with small amount of blood seen layering within the occipital horns, also unchanged. No other new intracranial hemorrhage or mass lesion identified. No acute large vessel territory infarct. No extra-axial fluid collection. Atrophy with chronic microvascular ischemic disease again noted. Vascular: No hyperdense vessel. Scattered vascular calcifications noted within the carotid siphons. Skull: Scalp soft tissues demonstrate no acute finding. Sequelae of prior bilateral burr hole craniotomy. Sinuses/Orbits: Globes orbital soft tissues within normal limits. Patient status post bilateral ocular lens replacement. Paranasal sinuses remain largely clear. Small bilateral maxillary sinus retention cyst noted. Small bilateral mastoid effusions noted. Other: None. IMPRESSION: 1. Relatively stable appearance of multifocal hemorrhagic masses with localized edema as above. No midline shift. 2. Associated small volume intraventricular hemorrhage, relatively unchanged. Stable ventricular size without hydrocephalus. 3. No other new acute intracranial abnormality. Electronically Signed   By: Jeannine Boga M.D.   On: 01/24/2018 02:17   Ct Head Wo Contrast  Result Date: 01/23/2018 CLINICAL DATA:  Initial evaluation for acute headache, recent intracranial hemorrhages. EXAM: CT HEAD WITHOUT CONTRAST TECHNIQUE: Contiguous axial images were obtained from the base of the skull through the vertex without intravenous contrast. COMPARISON:  Prior CT from 12/17/2017. FINDINGS: Brain: Since the previous exam, the right frontal intraparenchymal hemorrhage has enlarged in size now measuring 3.5 x 2.4 x 1.9 cm (series 2, image 25). Localized edema within this region overall slightly improved. Adjacent cystic encephalomalacia of now contains a small amount of layering hemorrhage (series 2, image 22). Additional left frontal intraparenchymal hematoma also increased in size now measuring 3.1 x 2.5 x 2.5 cm (series  2, image 22, previously 1.8 cm). Localized vasogenic edema slightly worsened from previous. Additional right frontotemporal hemorrhage also increased in size now measuring 1.5 x 1.7 x 1.1 cm (series 2, image 17). Localized vasogenic edema has worsened without significant regional mass effect. Additional soft tissue density at the parasagittal aspect of the anterior inferior left frontal lobe measures 1.5 x 1.2 x 1.3 cm with mild localized edema without midline shift. This may have been present on prior CT, although difficult to see on prior exam. Finding also likely reflects a small intraparenchymal hemorrhage. New small volume intraventricular hemorrhage with blood seen layering within the occipital horns of both lateral ventricles, likely via extension from the right frontal ventricular size is relatively stable without interval hydrocephalus. Basilar cisterns remain patent. No other acute intracranial hemorrhage. No subarachnoid or extra-axial hemorrhage appreciated. No acute large vessel territory infarct. Underlying cerebral atrophy, stable. Vascular: No  hyperdense vessel. Scattered vascular calcifications noted within the carotid siphons. Skull: Scalp soft tissues demonstrate no acute abnormality. Sequelae of prior bilateral burr hole craniotomy. Sinuses/Orbits: Globes and orbital soft tissues within normal limits. Small bilateral maxillary sinus retention cyst noted. Paranasal sinuses are otherwise clear. Small chronic right mastoid effusion noted. Other: None. IMPRESSION: 1. Interval increase in size of bilateral intraparenchymal hematomas as above. Localized vasogenic edema without significant midline shift. 2. New small volume intraventricular hemorrhage, likely via extension from the right frontal hematoma and adjacent cystic encephalomalacia. Stable ventricular size without hydrocephalus or ventricular trapping at this time. Critical Value/emergent results were called by telephone at the time of  interpretation on 01/23/2018 at 12:13 am to Dr. Rolland Porter , who verbally acknowledged these results. Electronically Signed   By: Jeannine Boga M.D.   On: 01/23/2018 00:18   Ct Chest W Contrast  Result Date: 01/24/2018 CLINICAL DATA:  Evaluate for metastatic disease. Initial cancer workup. EXAM: CT CHEST, ABDOMEN, AND PELVIS WITH CONTRAST TECHNIQUE: Multidetector CT imaging of the chest, abdomen and pelvis was performed following the standard protocol during bolus administration of intravenous contrast. CONTRAST:  111mL OMNIPAQUE IOHEXOL 300 MG/ML  SOLN COMPARISON:  None. FINDINGS: CT CHEST FINDINGS Cardiovascular: Heart size appears within normal limits. No pericardial effusion identified. Aortic atherosclerosis. Calcifications within the LAD and RCA coronary arteries noted. Mediastinum/Nodes: Normal appearance of the thyroid gland. The trachea appears patent and is midline. No axillary or supraclavicular adenopathy. Within the left posterior mediastinum there is a lymph node measuring 1.4 cm, image 37/3. Lungs/Pleura: No pleural effusion, airspace consolidation or atelectasis. Pulmonary nodule within the posterior costophrenic sulcus measures 9 mm, image 124/5. Also within the posterior left costophrenic sulcus is a 8 mm lung nodule, image 115/5. Musculoskeletal: No chest wall mass or suspicious bone lesions identified. CT ABDOMEN PELVIS FINDINGS Hepatobiliary: No focal liver abnormality is seen. No gallstones, gallbladder wall thickening, or biliary dilatation. Pancreas: Cystic lesion arising from the tail of pancreas measures 1.9 by 2.2 cm, image 52/3. No inflammation or main duct dilatation. Spleen: Spleen is normal. Adrenals/Urinary Tract: Normal appearance of the right adrenal gland. Left adrenal nodule measures 4.4 by 3.3 by 5.0 cm and measures 46 HU. Normal appearance of the right kidney. Multiple exophytic cysts are noted arising from the upper and lower pole of left kidney. Indeterminate  exophytic lesion arising from the anterior cortex of the interpolar left kidney measures 1 cm and 49 HU, image 64/3. No hydronephrosis identified bilaterally. No focal bladder lesion identified. Stomach/Bowel: Stomach appears normal. The small bowel loops have a normal course and caliber. No pathologic dilatation of the colon. Distal colonic diverticula noted without acute inflammation. Vascular/Lymphatic: Aortic atherosclerosis. No aneurysm. No abdominopelvic adenopathy. Reproductive: Prostate is unremarkable. Other: No abdominal wall hernia or abnormality. No abdominopelvic ascites. Musculoskeletal: Spondylosis noted within the lumbar spine. No aggressive lytic or sclerotic bone lesion. IMPRESSION: 1. No definite primary neoplasm identified. Several nonspecific findings are noted within the chest in abdomen including a 5 cm left adrenal mass. This may represent a benign or malignant tumor. Metastatic disease not excluded. More definitive characterization could be obtained with contrast enhanced abdominal MRI. 2. Small hyperdense left kidney lesion measures 1 cm and 49 HU. This may represent either and small enhancing neoplasm or hemorrhagic/proteinaceous cyst. This could also be further characterized with contrast enhanced abdominal MRI. 3. Single enlarged left posterior mediastinal lymph node measuring 1.4 cm. 4. Small exophytic cystic lesion arising from tail of pancreas measures 2.2 cm. Further  characterization with contrast enhanced MR of the abdomen is advised. 5. 2 small nodules noted within the posterior costophrenic sulcus within the left lower lobe, nonspecific. 6. As an alternative to further imaging with contrast enhanced abdominal MRI a PET-CT may be considered to assess for areas of hypermetabolism which may help guide biopsy of any suspicious hypermetabolic lesions. Electronically Signed   By: Kerby Moors M.D.   On: 01/24/2018 21:32   Mr Jeri Cos ZD Contrast  Result Date: 01/23/2018 CLINICAL  DATA:  Headaches.  Intracranial hemorrhages. EXAM: MRI HEAD WITHOUT AND WITH CONTRAST TECHNIQUE: Multiplanar, multiecho pulse sequences of the brain and surrounding structures were obtained without and with intravenous contrast. CONTRAST:  10 mL Gadavist COMPARISON:  Head CT 01/22/2018 and MRI 12/12/2017 FINDINGS: Brain: A hemorrhagic right frontal mass demonstrates solid enhancement and measures 3.2 x 2.7 cm (series 15, image 130). A cystic component or separate cystic lesion/cystic encephalomalacia inferior to the solid mass measures 4.2 x 2.6 cm and demonstrates a small amount of peripheral linear and nodular enhancement (series 15, image 116). A 1.7 cm hemorrhagic right frontal lobe mass anterior to the insula has enlarged from the prior MRI and now clearly enhances (series 15, image 90). A 1.2 cm enhancing mass in the anteromedial left frontal lobe is new or larger compared to the prior MRI (series 15, image 89). A 2.6 x 2.6 cm hemorrhagic lateral left frontal lobe mass has enlarged from the prior MRI and now also clearly has solid enhancing components. A 6 mm ring-enhancing inferior right frontal lesion is new (series 15, image 79). Motion artifact could obscure additional small enhancing lesions. There is moderate vasogenic edema associated with the lateral left frontal mass with milder edema associated with other bilateral frontal masses. As seen on the recent head CT, there is a small amount of blood in the occipital horns of the lateral ventricles. There is also trace subarachnoid hemorrhage posteriorly in the sylvian fissures. There is not a significant chronic microhemorrhage burden to suggest cerebral amyloid angiopathy. There is moderate cerebral atrophy, and there is ex vacuo dilatation of the right frontal horn. There is no evidence of acute infarct, midline shift, or extra-axial fluid collection. Vascular: Major intracranial vascular flow voids are preserved. Skull and upper cervical spine:  Bilateral burr holes related to prior subdural hematoma evacuation. No suspicious marrow lesion. Sinuses/Orbits: Bilateral cataract extraction. Small bilateral maxillary sinus mucous retention cysts. Small bilateral mastoid effusions. Other: None. IMPRESSION: 1. Multiple brain masses, many of which are hemorrhagic and now clearly demonstrate enhancement. These are consistent with metastases. 2. Up to moderate associated vasogenic edema.  No midline shift. 3. Small volume intraventricular and subarachnoid hemorrhage. Electronically Signed   By: Logan Bores M.D.   On: 01/23/2018 17:51   Mr Abdomen W GL Contrast  Result Date: 01/26/2018 CLINICAL DATA:  Inpatient. Multiple enhancing hemorrhagic bilateral cerebral masses compatible with metastatic disease of uncertain primary. Indeterminate left adrenal mass, left renal mass and cystic pancreatic tail mass on recent CT abdomen study. EXAM: MRI ABDOMEN WITHOUT AND WITH CONTRAST TECHNIQUE: Multiplanar multisequence MR imaging of the abdomen was performed both before and after the administration of intravenous contrast. CONTRAST:  10 cc Gadavist IV. COMPARISON:  01/24/2018 CT chest, abdomen and pelvis. FINDINGS: Significantly motion degraded scan, limiting assessment. Lower chest: Enlarged 1.4 cm left lower posterior mediastinal para-aortic node (series 5/image 4), unchanged from 01/24/2018 CT. Hepatobiliary: Normal liver size and configuration. Mild diffuse hepatic steatosis. No liver mass. Normal gallbladder with no cholelithiasis.  No biliary ductal dilatation. Common bile duct diameter 3 mm. No choledocholithiasis. Pancreas: There is a unilocular cystic pancreatic lesion measuring 2.4 x 1.8 x 2.0 cm arising exophytically from the anterior pancreatic tail (series 5/image 12), which demonstrates mild wall thickening and enhancement along the posterior margin, with no internal septations. Subcentimeter simple unilocular nonenhancing pancreatic body cystic lesion. No  pancreatic duct dilation. No pancreas divisum. Spleen: Normal size. No mass. Adrenals/Urinary Tract: Normal right adrenal. Left adrenal 4.6 x 3.3 cm mass (series 8/image 18) demonstrates mildly heterogeneous signal intensity on T1 and T2 weighted sequences with heterogeneous enhancement and absence of consistent loss of signal intensity on out of phase chemical shift imaging. No hydronephrosis. Several simple renal cysts in both kidneys, largest 2.7 cm in the interpolar left kidney. There is a 1.4 x 1.1 cm mildly T2 hyperintense and T1 isointense renal cortical lesion in the anterior interpolar left kidney (series 8/image 22) without convincing enhancement on the limited motion degraded postcontrast sequences, probably a Bosniak category 2 hemorrhagic/proteinaceous renal cyst. No overtly suspicious renal masses. Stomach/Bowel: Normal non-distended stomach. Visualized small and large bowel is normal caliber, with no bowel wall thickening. Vascular/Lymphatic: Normal caliber abdominal aorta. Patent portal, splenic, hepatic and renal veins. No pathologically enlarged lymph nodes in the abdomen. Other: No abdominal ascites or focal fluid collection. Musculoskeletal: No aggressive appearing focal osseous lesions. IMPRESSION: 1. Limited motion degraded scan. 2. Heterogeneous enhancing 4.6 cm left adrenal mass remains indeterminate by MRI, with no convincing intralesional lipid. Left adrenal metastasis cannot be excluded. Tissue sampling may be considered. 3. No overtly suspicious renal masses. Probable 1.4 cm Bosniak category 2 hemorrhagic/proteinaceous renal cyst in the anterior interpolar left kidney, for which follow-up MRI abdomen without and with IV contrast is recommended in 6-12 months given the limitations of this motion degraded scan. 4. 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 No pancreatic duct dilation. GI  consultation for consideration of EUS/FNA is indicated. This recommendation follows ACR consensus guidelines: Management of Incidental Pancreatic Cysts: A White Paper of the ACR Incidental Findings Committee. Graham [id number]. 5. Redemonstration of nonspecific mild left lower mediastinal adenopathy. 6. Mild diffuse hepatic steatosis. Electronically Signed   By: Ilona Sorrel M.D.   On: 01/26/2018 18:09   Ct Abdomen Pelvis W Contrast  Result Date: 01/24/2018 CLINICAL DATA:  Evaluate for metastatic disease. Initial cancer workup. EXAM: CT CHEST, ABDOMEN, AND PELVIS WITH CONTRAST TECHNIQUE: Multidetector CT imaging of the chest, abdomen and pelvis was performed following the standard protocol during bolus administration of intravenous contrast. CONTRAST:  161mL OMNIPAQUE IOHEXOL 300 MG/ML  SOLN COMPARISON:  None. FINDINGS: CT CHEST FINDINGS Cardiovascular: Heart size appears within normal limits. No pericardial effusion identified. Aortic atherosclerosis. Calcifications within the LAD and RCA coronary arteries noted. Mediastinum/Nodes: Normal appearance of the thyroid gland. The trachea appears patent and is midline. No axillary or supraclavicular adenopathy. Within the left posterior mediastinum there is a lymph node measuring 1.4 cm, image 37/3. Lungs/Pleura: No pleural effusion, airspace consolidation or atelectasis. Pulmonary nodule within the posterior costophrenic sulcus measures 9 mm, image 124/5. Also within the posterior left costophrenic sulcus is a 8 mm lung nodule, image 115/5. Musculoskeletal: No chest wall mass or suspicious bone lesions identified. CT ABDOMEN PELVIS FINDINGS Hepatobiliary: No focal liver abnormality is seen. No gallstones, gallbladder wall thickening, or biliary dilatation. Pancreas: Cystic lesion arising from the tail of pancreas measures 1.9 by 2.2 cm,  image 52/3. No inflammation or main duct dilatation. Spleen: Spleen is normal. Adrenals/Urinary Tract: Normal  appearance of the right adrenal gland. Left adrenal nodule measures 4.4 by 3.3 by 5.0 cm and measures 46 HU. Normal appearance of the right kidney. Multiple exophytic cysts are noted arising from the upper and lower pole of left kidney. Indeterminate exophytic lesion arising from the anterior cortex of the interpolar left kidney measures 1 cm and 49 HU, image 64/3. No hydronephrosis identified bilaterally. No focal bladder lesion identified. Stomach/Bowel: Stomach appears normal. The small bowel loops have a normal course and caliber. No pathologic dilatation of the colon. Distal colonic diverticula noted without acute inflammation. Vascular/Lymphatic: Aortic atherosclerosis. No aneurysm. No abdominopelvic adenopathy. Reproductive: Prostate is unremarkable. Other: No abdominal wall hernia or abnormality. No abdominopelvic ascites. Musculoskeletal: Spondylosis noted within the lumbar spine. No aggressive lytic or sclerotic bone lesion. IMPRESSION: 1. No definite primary neoplasm identified. Several nonspecific findings are noted within the chest in abdomen including a 5 cm left adrenal mass. This may represent a benign or malignant tumor. Metastatic disease not excluded. More definitive characterization could be obtained with contrast enhanced abdominal MRI. 2. Small hyperdense left kidney lesion measures 1 cm and 49 HU. This may represent either and small enhancing neoplasm or hemorrhagic/proteinaceous cyst. This could also be further characterized with contrast enhanced abdominal MRI. 3. Single enlarged left posterior mediastinal lymph node measuring 1.4 cm. 4. Small exophytic cystic lesion arising from tail of pancreas measures 2.2 cm. Further characterization with contrast enhanced MR of the abdomen is advised. 5. 2 small nodules noted within the posterior costophrenic sulcus within the left lower lobe, nonspecific. 6. As an alternative to further imaging with contrast enhanced abdominal MRI a PET-CT may be  considered to assess for areas of hypermetabolism which may help guide biopsy of any suspicious hypermetabolic lesions. Electronically Signed   By: Kerby Moors M.D.   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  Hemorrhagic nature raises suspicion for melanoma.  His case was discussed in Brain and Spine Tumor Board this morning.  In review involving radiation oncology, neurosurgery, neuropathology, and neuroradiology, recommendation was made for adrenal biopsy over brain biopsy.  Aside from higher surgical risk, extensive blood products would limit yield of pathology specimen.  Can decreased decadron to 4mg  daily. Will continue to follow once tissue diagnosis is obtained.  All questions were answered. The patient knows to call the clinic with any problems, questions or concerns.  The total time spent in the encounter was 40 minutes and more than 50% was on counseling and review of test results     Ventura Sellers, MD 01/27/2018 4:12 PM

## 2018-01-27 NOTE — Progress Notes (Signed)
Occupational Therapy Treatment Patient Details Name: [name] [name] MRN: [id number] DOB: [birth year] Today's Date: 01/27/2018    History of present illness This 77 y.o. 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  Repeat CT of head showed multifocal hemorrhagic masses with localized edema.  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  Small volume intraventricular SAH.  PMH includes: Lt rotator cuff tear, Legally blind OS, HTN, cataracts, A-Fib    OT comments  Pt overall modified independent with basic ADL tasks and mobility. Apparent cognitive deficits. Continue to recommend follow up [place] to maximize functional independence with IADL tasks.   Follow Up Recommendations  Home health OT;Other (comment)    Equipment Recommendations  None recommended by OT    Recommendations for Other Services      Precautions / Restrictions Precautions Precautions: Fall       Mobility Bed Mobility Overal bed mobility: Modified Independent                Transfers Overall transfer level: Modified independent                    Balance Overall balance assessment: No apparent balance deficits (not formally assessed)                                         ADL either performed or assessed with clinical judgement   ADL Overall ADL's : Modified independent                                             Vision       Perception     Praxis      Cognition Arousal/Alertness: Awake/alert Behavior During Therapy: WFL for tasks assessed/performed Overall Cognitive Status: Impaired/Different from baseline Area of Impairment: Attention;Memory                   Current Attention Level:  Selective Memory: Decreased short-term memory     Awareness: Emergent Problem Solving: Requires verbal cues General Comments: Pt given task to take L at NS then ask secretary for water. Pt required cues to take left rather than L; vc to remember to ask for water then vc to find room        Exercises     Shoulder Instructions       General Comments      Pertinent Vitals/ Pain       Pain Assessment: No/denies pain  Home Living                                          Prior Functioning/Environment              Frequency  Min 2X/week        Progress Toward Goals  OT Goals(current goals can now be found in the care plan section)  Progress towards OT goals: Progressing toward goals  Acute Rehab OT Goals Patient Stated Goal: to get better  OT Goal Formulation:  With patient Time For Goal Achievement: 02/07/18 Potential to Achieve Goals: Good ADL Goals Additional ADL Goal #1: Pt will recall events of day with min cues Additional ADL Goal #2: Pt will perform money management with min cues Additional ADL Goal #3: Pt will selectively attend to ADL tasks with no cues  Plan Discharge plan remains appropriate    Co-evaluation                 AM-PAC OT "6 Clicks" Daily Activity     Outcome Measure   Help from another person eating meals?: None Help from another person taking care of personal grooming?: None Help from another person toileting, which includes using toliet, bedpan, or urinal?: None Help from another person bathing (including washing, rinsing, drying)?: None Help from another person to put on and taking off regular upper body clothing?: None Help from another person to put on and taking off regular lower body clothing?: None 6 Click Score: 24    End of Session    OT Visit Diagnosis: Cognitive communication deficit (R41.841);Muscle weakness (generalized) (M62.81)   Activity Tolerance Patient tolerated treatment well    Patient Left in chair;with call bell/phone within reach;with chair alarm set   Nurse Communication Mobility status        Time: 1610-9604 OT Time Calculation (min): 24 min  Charges: OT General Charges $OT Visit: 1 Visit OT Treatments $Self Care/Home Management : 23-37 mins  Maurie Boettcher, OT/L   Acute OT Clinical Specialist [place] Pager [phone] Office [phone]    [facility] 01/27/2018, 3:57 PM

## 2018-01-28 ENCOUNTER — Ambulatory Visit (HOSPITAL_COMMUNITY): Payer: PPO

## 2018-01-28 ENCOUNTER — Encounter (HOSPITAL_COMMUNITY): Payer: Self-pay

## 2018-01-28 NOTE — Care Management Important Message (Signed)
Important Message  Patient Details  Name: [name] [name] MRN: [id number] Date of Birth: 1940/06/30   Medicare Important Message Given:  Yes    Orbie Pyo 01/28/2018, 8:08 AM

## 2018-01-28 NOTE — Progress Notes (Signed)
PROGRESS NOTE    [name] [name]  MWN:[id number] DOB: [birth year] DOA: 01/22/2018 PCP: Dettinger, [name], MD   Brief Narrative: Patient is a 77 year old man with past medical history of A. fib not on anti-coagulation, subdural hemorrhage, hypertension, hyperlipidemia, hypothyroidism who presented with 2 weeks history of headache. MRI brain showed intracranial hemorrhage with multiple hemorrhagic masses with localized edema but no midline shift.  There was also presence of small intraventricular hemorrhage,subarachnoid hemorrhage. Oncology consulted for themetastatic process.Started on decadron. We have consulted radiation oncology and neuro-oncology .  MRI of the abdomen with and without contrast ordered for further evaluation of adrenal mass and it confirmed 4.6 cm adrenal mass.  Plan is to do a CT-guided biopsy of the left adrenal mass.  IR consulted.   Assessment & Plan:   Principal Problem:   ICH (intracerebral hemorrhage) (HCC) Active Problems:   Hypothyroidism   Essential hypertension   Paroxysmal atrial fibrillation (HCC)   Headache   Precancerous skin lesion   Brain metastases (HCC)   Lesion of adrenal gland (HCC)  Multifocal intracerebral hemorrhage with surrounding vasogenic cerebral edema: Looks like  hemorrhagic metastatic diesase.  Origin is unknown at this time. Oncology was consulted and was recommended to get a CT chest/abdomen/pelvis which showed multiple abnormalities suggesting metastatic disease but no primary.Also showed 5 cm left adrenal mass and lymph node enlargements. We consulted radiation oncology ,neuro Oncology Dr Mickeal Skinner. MRI of the abdomen with and without contrast ordered for further evaluation of adrenal mass which showed 4.6 cm left renal mass and 2.4 cm anterior paratracheal mass ,cannot exclude malignancy .Plan for  adrenal biopsy by IR.  Headache: Much better now.  Continue dexamethasone. D/Ced Topamax.  A. fib: Was On Cardizem and flecainide.  Not  on anticoagulation.  Rate controlled.  Hypertension: BP soft. Cardizem and lisinopril on hold.  Hypothyroidism: Continue Synthyroid  Hyperlipidemia: Continue statin  Deconditioning/debility: Patient evaluated by physical therapy and recommended home health on discharge.   DVT prophylaxis: SCD Code Status: Full Family Communication:  Disposition Plan: pending workup.  Consultants: Oncology  Procedures:None,pending left adrenal mass biopsy  Antimicrobials:None  Subjective: Patient seen and examined the bedside this afternoon.  Remains comfortable.  No complaints. Awaiting biopsy   Objective: Vitals:   01/27/18 1528 01/27/18 1951 01/28/18 0012 01/28/18 0407  BP: (!) 114/47 123/67 114/67 125/65  Pulse: 63 62 62 60  Resp:  18 18 18   Temp: 98 F (36.7 C) 97.7 F (36.5 C) 98 F (36.7 C) 98 F (36.7 C)  TempSrc: Oral Oral Oral Oral  SpO2: 97% 99% 97% 99%  Weight:      Height:       No intake or output data in the 24 hours ending 01/28/18 1117 Filed Weights   01/22/18 2256  Weight: 124.7 kg    Examination:  General exam: Appears calm and comfortable ,Not in distress,obese  HEENT:left eye blindness,Oral mucosa moist, Ear/Nose normal on gross exam Respiratory system: Bilateral equal air entry, normal vesicular breath sounds, no wheezes or crackles  Cardiovascular system: S1 & S2 heard, RRR. No JVD, murmurs, rubs, gallops or clicks. No pedal edema. Gastrointestinal system: Abdomen is nondistended, soft and nontender. No organomegaly or masses felt. Normal bowel sounds heard. Central nervous system: Alert and oriented. No focal neurological deficits. Extremities: No edema, no clubbing ,no cyanosis, distal peripheral pulses palpable. 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 Mood & affect appropriate.  Data Reviewed: I have personally reviewed following  labs and imaging studies  CBC: Recent Labs  Lab 01/22/18 2358 01/24/18 0533 01/25/18 0456  WBC 11.3* 9.1 6.0  NEUTROABS 7.3  --   --   HGB 14.4 15.2 15.7  HCT 44.3 47.1 48.5  MCV 89.0 87.9 87.9  PLT 242 262 454   Basic Metabolic Panel: Recent Labs  Lab 01/22/18 2358 01/24/18 0533 01/25/18 0456  NA 135 134* 133*  K 3.9 3.9 4.1  CL 101 101 100  CO2 23 23 20*  GLUCOSE 110* 104* 183*  BUN 13 12 12   CREATININE 0.97 0.87 1.13  CALCIUM 9.1 9.1 9.4   GFR: Estimated Creatinine Clearance: 73.7 mL/min (by C-G formula based on SCr of 1.13 mg/dL). Liver Function Tests: Recent Labs  Lab 01/22/18 2358  AST 27  ALT 36  ALKPHOS 66  BILITOT 0.9  PROT 7.7  ALBUMIN 4.3   No results for input(s): LIPASE, AMYLASE in the last 168 hours. No results for input(s): AMMONIA in the last 168 hours. Coagulation Profile: Recent Labs  Lab 01/22/18 2358  INR 1.00   Cardiac Enzymes: No results for input(s): CKTOTAL, CKMB, CKMBINDEX, TROPONINI in the last 168 hours. BNP (last 3 results) No results for input(s): PROBNP in the last 8760 hours. HbA1C: No results for input(s): HGBA1C in the last 72 hours. CBG: No results for input(s): GLUCAP in the last 168 hours. Lipid Profile: No results for input(s): CHOL, HDL, LDLCALC, TRIG, CHOLHDL, LDLDIRECT in the last 72 hours. Thyroid Function Tests: No results for input(s): TSH, T4TOTAL, FREET4, T3FREE, THYROIDAB in the last 72 hours. Anemia Panel: No results for input(s): VITAMINB12, FOLATE, FERRITIN, TIBC, IRON, RETICCTPCT in the last 72 hours. Sepsis Labs: No results for input(s): PROCALCITON, LATICACIDVEN in the last 168 hours.  Recent Results (from the past 240 hour(s))  MRSA PCR Screening     Status: None   Collection Time: 01/23/18  2:25 PM  Result Value Ref Range Status   MRSA by PCR NEGATIVE NEGATIVE Final    Comment:        The GeneXpert MRSA Assay (FDA approved for NASAL specimens only), is one component of a comprehensive  MRSA colonization surveillance program. It is not intended to diagnose MRSA infection nor to guide or monitor treatment for MRSA infections. Performed at [facility], [place] [address], [place], [place] [address]          Radiology Studies: Mr Abdomen W Wo Contrast  Result Date: 01/26/2018 CLINICAL DATA:  Inpatient. Multiple enhancing hemorrhagic bilateral cerebral masses compatible with metastatic disease of uncertain primary. Indeterminate left adrenal mass, left renal mass and cystic pancreatic tail mass on recent CT abdomen study. EXAM: MRI ABDOMEN WITHOUT AND WITH CONTRAST TECHNIQUE: Multiplanar multisequence MR imaging of the abdomen was performed both before and after the administration of intravenous contrast. CONTRAST:  10 cc Gadavist IV. COMPARISON:  01/24/2018 CT chest, abdomen and pelvis. FINDINGS: Significantly motion degraded scan, limiting assessment. Lower chest: Enlarged 1.4 cm left lower posterior mediastinal para-aortic node (series 5/image 4), unchanged from 01/24/2018 CT. Hepatobiliary: Normal liver size and configuration. Mild diffuse hepatic steatosis. No liver mass. Normal gallbladder with no cholelithiasis. No biliary ductal dilatation. Common bile duct diameter 3 mm. No choledocholithiasis. Pancreas: There is a unilocular cystic pancreatic lesion measuring 2.4 x 1.8 x 2.0 cm arising exophytically from the anterior pancreatic tail (series 5/image 12), which demonstrates mild wall thickening and enhancement along the posterior margin, with no internal septations. Subcentimeter  simple unilocular nonenhancing pancreatic body cystic lesion. No pancreatic duct dilation. No pancreas divisum. Spleen: Normal size. No mass. Adrenals/Urinary Tract: Normal right adrenal. Left adrenal 4.6 x 3.3 cm mass (series 8/image 18) demonstrates mildly heterogeneous signal intensity on T1 and T2 weighted sequences with heterogeneous enhancement and absence of consistent loss of signal  intensity on out of phase chemical shift imaging. No hydronephrosis. Several simple renal cysts in both kidneys, largest 2.7 cm in the interpolar left kidney. There is a 1.4 x 1.1 cm mildly T2 hyperintense and T1 isointense renal cortical lesion in the anterior interpolar left kidney (series 8/image 22) without convincing enhancement on the limited motion degraded postcontrast sequences, probably a Bosniak category 2 hemorrhagic/proteinaceous renal cyst. No overtly suspicious renal masses. Stomach/Bowel: Normal non-distended stomach. Visualized small and large bowel is normal caliber, with no bowel wall thickening. Vascular/Lymphatic: Normal caliber abdominal aorta. Patent portal, splenic, hepatic and renal veins. No pathologically enlarged lymph nodes in the abdomen. Other: No abdominal ascites or focal fluid collection. Musculoskeletal: No aggressive appearing focal osseous lesions. IMPRESSION: 1. Limited motion degraded scan. 2. Heterogeneous enhancing 4.6 cm left adrenal mass remains indeterminate by MRI, with no convincing intralesional lipid. Left adrenal metastasis cannot be excluded. Tissue sampling may be considered. 3. No overtly suspicious renal masses. Probable 1.4 cm Bosniak category 2 hemorrhagic/proteinaceous renal cyst in the anterior interpolar left kidney, for which follow-up MRI abdomen without and with IV contrast is recommended in 6-12 months given the limitations of this motion degraded scan. 4. 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 No pancreatic duct dilation. GI consultation for consideration of EUS/FNA is indicated. This recommendation follows ACR consensus guidelines: Management of Incidental Pancreatic Cysts: A White Paper of the ACR Incidental Findings Committee. [place] 3790;24:[id number]. 5. Redemonstration of nonspecific mild left lower mediastinal adenopathy. 6. Mild diffuse  hepatic steatosis. Electronically Signed   By: Ilona Sorrel M.D.   On: 01/26/2018 18:09        Scheduled Meds: . atorvastatin  10 mg Oral Daily  . dexamethasone  4 mg Oral Q12H  . flecainide  100 mg Oral BID  . levothyroxine  100 mcg Oral QAC breakfast  . pantoprazole  40 mg Oral Daily  . senna-docusate  1 tablet Oral BID  . sodium chloride flush  3 mL Intravenous Q12H  . sodium chloride flush  3 mL Intravenous Q12H   Continuous Infusions: . sodium chloride       LOS: 5 days    Time spent: 35 mins.More than 50% of that time was spent in counseling and/or coordination of care.      Shelly Coss, MD [facility] Pager [phone]  If 7PM-7AM, please contact night-coverage [email or web address] Password [email or web address] 01/28/2018, 11:17 AM

## 2018-01-28 NOTE — Consult Note (Signed)
Chief Complaint: Patient was seen in consultation today for left adrenal mass biopsy.  Referring Physician(s): Dr. Shelly Coss  Supervising Physician: Sandi Mariscal  Patient Status: [facility] - In-pt  History of Present Illness: [name] [name] is a 77 y.o. 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 He had previously been admitted to [facility] ED from 11/24/17 - 11/26/17 due to multifocal acute intraparenchymal hemorrhage which was followed by neurology and patient was cleared to discharge home. He again presented to [facility] ED on 12/02/17 due to AMS - no acute issues were found during that visit as cause for confusion and he was discharged to home that same day with close follow up with neurology planned. He again presented to [facility] ED on 12/17/17 with complaints sudden onset of severe headache that had resolved but was concerned given his previous history - no acute abnormality was found at that visit and he was discharged to home again with close neurological follow up planned. Most recently he presented to [facility] ED on 01/22/18 with complaints of headache x 2 weeks associated with occasional vomiting, decreased appetite and feeling off balance. CT at that time showed intraparenchymal and intraventricular bleed - he was then transferred to [facility] for further evaluation.   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 CT chest/abdomen/pelvis was then ordered to evaluate for possible primary tumor which showed no  definite primary neoplasm however a 5 cm left adrenal mass was noted. Request has been made to IR for biopsy of this adrenal mass for possible tissue diagnosis.  Patient denies any complaints currently, multiple family and friends at bedside today. He states he has been feeling pretty good the past several days, denies headache currently. He states he is aware of biopsy and wishes to proceed.   Past Medical History:  Diagnosis Date  . Arthritis   . Atrial fibrillation ([place])    previously on Coumadin, reversed with head bleed  . Cataract   . Hyperlipidemia   . HYPERTENSION   . HYPOTHYROIDISM   . 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 Pre-diabetes   . Precancerous skin lesion    on back  . ROTATOR CUFF TEAR   . Subdural hematoma [facility])     Past Surgical History:  Procedure Laterality Date  . APPENDECTOMY    . CATARACT EXTRACTION W/PHACO Right 02/02/2014   Procedure: CATARACT EXTRACTION PHACO AND INTRAOCULAR LENS PLACEMENT (IOC);  Surgeon: Elta Guadeloupe T. Gershon Crane, MD;  Location: [facility];  Service: Ophthalmology;  Laterality: Right;  CDE 12.17  . CATARACT EXTRACTION W/PHACO Left 02/16/2014   Procedure: CATARACT EXTRACTION PHACO AND INTRAOCULAR LENS PLACEMENT ;  Surgeon: Elta Guadeloupe T. Gershon Crane, MD;  Location: [facility];  Service: Ophthalmology;  Laterality: Left;  CDE:13.42  . CRANIOTOMY N/A 02/05/2017   Procedure: Bilateral Burr Holes . Craniotomy for Subdural;  Surgeon: Ditty, Kevan Ny, MD;  Location: [place];  Service: Neurosurgery;  Laterality: N/A;  . EYE SURGERY  2015   cataract surgery. bilateral  . JOINT REPLACEMENT  2009   bilateral knee  replacement  . KNEE SURGERY    . ROTATOR CUFF REPAIR Left   . SHOULDER SURGERY    . TOTAL KNEE ARTHROPLASTY Bilateral     Allergies: Patient has no known allergies.  Medications: Prior to Admission medications   Medication Sig Start Date End Date Taking? Authorizing Provider  albuterol (PROVENTIL HFA;VENTOLIN HFA) 108 (90  Base) MCG/ACT inhaler Inhale 2 puffs into the lungs every 6 (six) hours as needed for wheezing or shortness of breath. 06/29/16  Yes Eustaquio Maize, MD  atorvastatin (LIPITOR) 10 MG tablet Take 1 tablet (10 mg total) by mouth daily. 12/17/17  Yes Chipper Herb, MD  diltiazem (CARDIZEM CD) 360 MG 24 hr capsule TAKE 1 CAPSULE BY MOUTH EVERY DAY Patient taking differently: Take 360 mg by mouth daily.  01/07/18  Yes Dettinger, [name], MD  flecainide (TAMBOCOR) 100 MG tablet TAKE 1 TABLET (100 MG TOTAL) BY MOUTH 2 (TWO) TIMES DAILY. Patient taking differently: Take 100 mg by mouth 2 (two) times daily.  08/23/17  Yes Timmothy Euler, MD  levothyroxine (SYNTHROID, LEVOTHROID) 50 MCG tablet TAKE 2 TABLETS BY MOUTH DAILY Patient taking differently: Take 100 mcg by mouth daily before breakfast.  07/29/17  Yes Timmothy Euler, MD  lisinopril (PRINIVIL,ZESTRIL) 20 MG tablet TAKE 1 TABLET (20 MG TOTAL) BY MOUTH DAILY. 08/23/17  Yes Timmothy Euler, MD  metFORMIN (GLUCOPHAGE-XR) 500 MG 24 hr tablet Take 1 tablet (500 mg total) by mouth daily with breakfast. (Need to be seen) 01/14/18  Yes Dettinger, [name], MD     Family History  Problem Relation Age of Onset  . Heart attack Brother   . Early death Brother   . Asthma Father   . Vision loss Father   . Hypertension Mother   . Heart attack Brother 19  . Arthritis Brother     Social History   Socioeconomic History  . Marital status: Married    Spouse name: Not on file  . Number of children: 2  . Years of education: Not on file  . Highest education level: Not on file  Occupational History  . Not on file  Social Needs  . Financial resource strain: Not hard at all  . Food insecurity:    Worry: Never true    Inability: Never true  . Transportation needs:    Medical: No    Non-medical: No  Tobacco Use  . Smoking status: Never Smoker  . Smokeless tobacco: Never Used  Substance and Sexual Activity  . Alcohol use: No    Frequency:  Never    Comment: remote use, h/o heavy binge use about 25 years ago  . Drug use: No    Comment: remote h/o marijuana use  . Sexual activity: Never    Birth control/protection: None  Lifestyle  . Physical activity:    Days per week: 0 days    Minutes per session: 0 min  . Stress: Not at all  Relationships  . Social connections:    Talks on phone: More than three times a week    Gets together: More than three times a week    Attends religious service: More than 4 times per year    Active member of club or organization: Yes    Attends meetings of clubs or organizations: More than 4 times per year    Relationship status: Married  Other Topics Concern  . Not on file  Social History Narrative  Review of Systems: A 12 point ROS discussed and pertinent positives are indicated in the HPI above.  All other systems are negative.  Review of Systems  Constitutional: Negative for appetite change, chills and fever.  Respiratory: Negative for cough and shortness of breath.   Gastrointestinal: Negative for abdominal pain, diarrhea, nausea and vomiting.  Genitourinary: Negative for dysuria, flank pain and hematuria.  Musculoskeletal: Negative for back pain.  Skin: Negative for rash.  Neurological: Negative for dizziness, seizures, syncope and numbness.  Psychiatric/Behavioral: Negative for confusion.    Vital Signs: BP (!) 141/82 (BP Location: Left Arm)   Pulse 64   Temp 98.1 F (36.7 C)   Resp 16   Ht 5\' 9"  (1.753 m)   Wt 275 lb (124.7 kg)   SpO2 97%   BMI 40.61 kg/m   Physical Exam  Constitutional: He is oriented to person, place, and time. No distress.  HENT:  Head: Normocephalic.  Cardiovascular: Normal rate, regular rhythm and normal heart sounds.  Pulmonary/Chest: Effort normal and breath sounds normal.  Abdominal: Soft. He exhibits no distension. There is no tenderness.  Neurological: He is alert and oriented to person, place, and time.  Skin: Skin is warm and  dry. He is not diaphoretic.  Psychiatric: He has a normal mood and affect. His behavior is normal. Judgment and thought content normal.  Nursing note and vitals reviewed.    MD Evaluation Airway: WNL Heart: WNL Abdomen: WNL Chest/ Lungs: WNL ASA  Classification: 3 Mallampati/Airway Score: Two   Imaging: Ct Head Wo Contrast  Result Date: 01/24/2018 CLINICAL DATA:  Follow-up examination for acute stroke. EXAM: CT HEAD WITHOUT CONTRAST TECHNIQUE: Contiguous axial images were obtained from the base of the skull through the vertex without intravenous contrast. COMPARISON:  Previous CT from 01/22/2018 as well as brain MRI from 01/23/2018. FINDINGS: Brain: Multiple hemorrhagic brain masses again seen, relatively stable and unchanged in size as compared to most recent CT. Largest right frontal lobe mass measures 3.6 x 2.4 cm. Dominant left frontal lesion measures 2.7 x 2.8 cm. Mass at the left gyrus rectus measures 1.4 x 1.2 cm. Additional right frontal operculum lower lesion measures 1.5 x 1.5 cm. Surrounding vasogenic edema about several of these lesions, most notable at the left frontal lesion. No midline shift. Basilar cisterns remain patent. Cystic encephalomalacia at the adjacent right frontal lesion with is small amount of layering hemorrhage again noted. Intraventricular extension with small amount of blood seen layering within the occipital horns, also unchanged. No other new intracranial hemorrhage or mass lesion identified. No acute large vessel territory infarct. No extra-axial fluid collection. Atrophy with chronic microvascular ischemic disease again noted. Vascular: No hyperdense vessel. Scattered vascular calcifications noted within the carotid siphons. Skull: Scalp soft tissues demonstrate no acute finding. Sequelae of prior bilateral burr hole craniotomy. Sinuses/Orbits: Globes orbital soft tissues within normal limits. Patient status post bilateral ocular lens replacement. Paranasal  sinuses remain largely clear. Small bilateral maxillary sinus retention cyst noted. Small bilateral mastoid effusions noted. Other: None. IMPRESSION: 1. Relatively stable appearance of multifocal hemorrhagic masses with localized edema as above. No midline shift. 2. Associated small volume intraventricular hemorrhage, relatively unchanged. Stable ventricular size without hydrocephalus. 3. No other new acute intracranial abnormality. Electronically Signed   By: Jeannine Boga M.D.   On: 01/24/2018 02:17   Ct Head Wo Contrast  Result Date: 01/23/2018 CLINICAL DATA:  Initial evaluation for acute headache, recent intracranial hemorrhages. EXAM: CT HEAD WITHOUT CONTRAST TECHNIQUE: Contiguous axial images were  obtained from the base of the skull through the vertex without intravenous contrast. COMPARISON:  Prior CT from 12/17/2017. FINDINGS: Brain: Since the previous exam, the right frontal intraparenchymal hemorrhage has enlarged in size now measuring 3.5 x 2.4 x 1.9 cm (series 2, image 25). Localized edema within this region overall slightly improved. Adjacent cystic encephalomalacia of now contains a small amount of layering hemorrhage (series 2, image 22). Additional left frontal intraparenchymal hematoma also increased in size now measuring 3.1 x 2.5 x 2.5 cm (series 2, image 22, previously 1.8 cm). Localized vasogenic edema slightly worsened from previous. Additional right frontotemporal hemorrhage also increased in size now measuring 1.5 x 1.7 x 1.1 cm (series 2, image 17). Localized vasogenic edema has worsened without significant regional mass effect. Additional soft tissue density at the parasagittal aspect of the anterior inferior left frontal lobe measures 1.5 x 1.2 x 1.3 cm with mild localized edema without midline shift. This may have been present on prior CT, although difficult to see on prior exam. Finding also likely reflects a small intraparenchymal hemorrhage. New small volume  intraventricular hemorrhage with blood seen layering within the occipital horns of both lateral ventricles, likely via extension from the right frontal ventricular size is relatively stable without interval hydrocephalus. Basilar cisterns remain patent. No other acute intracranial hemorrhage. No subarachnoid or extra-axial hemorrhage appreciated. No acute large vessel territory infarct. Underlying cerebral atrophy, stable. Vascular: No hyperdense vessel. Scattered vascular calcifications noted within the carotid siphons. Skull: Scalp soft tissues demonstrate no acute abnormality. Sequelae of prior bilateral burr hole craniotomy. Sinuses/Orbits: Globes and orbital soft tissues within normal limits. Small bilateral maxillary sinus retention cyst noted. Paranasal sinuses are otherwise clear. Small chronic right mastoid effusion noted. Other: None. IMPRESSION: 1. Interval increase in size of bilateral intraparenchymal hematomas as above. Localized vasogenic edema without significant midline shift. 2. New small volume intraventricular hemorrhage, likely via extension from the right frontal hematoma and adjacent cystic encephalomalacia. Stable ventricular size without hydrocephalus or ventricular trapping at this time. Critical Value/emergent results were called by telephone at the time of interpretation on 01/23/2018 at 12:13 am to Dr. Rolland Porter , who verbally acknowledged these results. Electronically Signed   By: Jeannine Boga M.D.   On: 01/23/2018 00:18   Ct Chest W Contrast  Result Date: 01/24/2018 CLINICAL DATA:  Evaluate for metastatic disease. Initial cancer workup. EXAM: CT CHEST, ABDOMEN, AND PELVIS WITH CONTRAST TECHNIQUE: Multidetector CT imaging of the chest, abdomen and pelvis was performed following the standard protocol during bolus administration of intravenous contrast. CONTRAST:  181mL OMNIPAQUE IOHEXOL 300 MG/ML  SOLN COMPARISON:  None. FINDINGS: CT CHEST FINDINGS Cardiovascular: Heart size  appears within normal limits. No pericardial effusion identified. Aortic atherosclerosis. Calcifications within the LAD and RCA coronary arteries noted. Mediastinum/Nodes: Normal appearance of the thyroid gland. The trachea appears patent and is midline. No axillary or supraclavicular adenopathy. Within the left posterior mediastinum there is a lymph node measuring 1.4 cm, image 37/3. Lungs/Pleura: No pleural effusion, airspace consolidation or atelectasis. Pulmonary nodule within the posterior costophrenic sulcus measures 9 mm, image 124/5. Also within the posterior left costophrenic sulcus is a 8 mm lung nodule, image 115/5. Musculoskeletal: No chest wall mass or suspicious bone lesions identified. CT ABDOMEN PELVIS FINDINGS Hepatobiliary: No focal liver abnormality is seen. No gallstones, gallbladder wall thickening, or biliary dilatation. Pancreas: Cystic lesion arising from the tail of pancreas measures 1.9 by 2.2 cm, image 52/3. No inflammation or main duct dilatation. Spleen: Spleen is normal. Adrenals/Urinary  Tract: Normal appearance of the right adrenal gland. Left adrenal nodule measures 4.4 by 3.3 by 5.0 cm and measures 46 HU. Normal appearance of the right kidney. Multiple exophytic cysts are noted arising from the upper and lower pole of left kidney. Indeterminate exophytic lesion arising from the anterior cortex of the interpolar left kidney measures 1 cm and 49 HU, image 64/3. No hydronephrosis identified bilaterally. No focal bladder lesion identified. Stomach/Bowel: Stomach appears normal. The small bowel loops have a normal course and caliber. No pathologic dilatation of the colon. Distal colonic diverticula noted without acute inflammation. Vascular/Lymphatic: Aortic atherosclerosis. No aneurysm. No abdominopelvic adenopathy. Reproductive: Prostate is unremarkable. Other: No abdominal wall hernia or abnormality. No abdominopelvic ascites. Musculoskeletal: Spondylosis noted within the lumbar spine.  No aggressive lytic or sclerotic bone lesion. IMPRESSION: 1. No definite primary neoplasm identified. Several nonspecific findings are noted within the chest in abdomen including a 5 cm left adrenal mass. This may represent a benign or malignant tumor. Metastatic disease not excluded. More definitive characterization could be obtained with contrast enhanced abdominal MRI. 2. Small hyperdense left kidney lesion measures 1 cm and 49 HU. This may represent either and small enhancing neoplasm or hemorrhagic/proteinaceous cyst. This could also be further characterized with contrast enhanced abdominal MRI. 3. Single enlarged left posterior mediastinal lymph node measuring 1.4 cm. 4. Small exophytic cystic lesion arising from tail of pancreas measures 2.2 cm. Further characterization with contrast enhanced MR of the abdomen is advised. 5. 2 small nodules noted within the posterior costophrenic sulcus within the left lower lobe, nonspecific. 6. As an alternative to further imaging with contrast enhanced abdominal MRI a PET-CT may be considered to assess for areas of hypermetabolism which may help guide biopsy of any suspicious hypermetabolic lesions. Electronically Signed   By: Kerby Moors M.D.   On: 01/24/2018 21:32   Mr Jeri Cos EY Contrast  Result Date: 01/23/2018 CLINICAL DATA:  Headaches.  Intracranial hemorrhages. EXAM: MRI HEAD WITHOUT AND WITH CONTRAST TECHNIQUE: Multiplanar, multiecho pulse sequences of the brain and surrounding structures were obtained without and with intravenous contrast. CONTRAST:  10 mL Gadavist COMPARISON:  Head CT 01/22/2018 and MRI 12/12/2017 FINDINGS: Brain: A hemorrhagic right frontal mass demonstrates solid enhancement and measures 3.2 x 2.7 cm (series 15, image 130). A cystic component or separate cystic lesion/cystic encephalomalacia inferior to the solid mass measures 4.2 x 2.6 cm and demonstrates a small amount of peripheral linear and nodular enhancement (series 15, image  116). A 1.7 cm hemorrhagic right frontal lobe mass anterior to the insula has enlarged from the prior MRI and now clearly enhances (series 15, image 90). A 1.2 cm enhancing mass in the anteromedial left frontal lobe is new or larger compared to the prior MRI (series 15, image 89). A 2.6 x 2.6 cm hemorrhagic lateral left frontal lobe mass has enlarged from the prior MRI and now also clearly has solid enhancing components. A 6 mm ring-enhancing inferior right frontal lesion is new (series 15, image 79). Motion artifact could obscure additional small enhancing lesions. There is moderate vasogenic edema associated with the lateral left frontal mass with milder edema associated with other bilateral frontal masses. As seen on the recent head CT, there is a small amount of blood in the occipital horns of the lateral ventricles. There is also trace subarachnoid hemorrhage posteriorly in the sylvian fissures. There is not a significant chronic microhemorrhage burden to suggest cerebral amyloid angiopathy. There is moderate cerebral atrophy, and there is ex vacuo  dilatation of the right frontal horn. There is no evidence of acute infarct, midline shift, or extra-axial fluid collection. Vascular: Major intracranial vascular flow voids are preserved. Skull and upper cervical spine: Bilateral burr holes related to prior subdural hematoma evacuation. No suspicious marrow lesion. Sinuses/Orbits: Bilateral cataract extraction. Small bilateral maxillary sinus mucous retention cysts. Small bilateral mastoid effusions. Other: None. IMPRESSION: 1. Multiple brain masses, many of which are hemorrhagic and now clearly demonstrate enhancement. These are consistent with metastases. 2. Up to moderate associated vasogenic edema.  No midline shift. 3. Small volume intraventricular and subarachnoid hemorrhage. Electronically Signed   By: Logan Bores M.D.   On: 01/23/2018 17:51   Mr Abdomen W PJ Contrast  Result Date: 01/26/2018 CLINICAL  DATA:  Inpatient. Multiple enhancing hemorrhagic bilateral cerebral masses compatible with metastatic disease of uncertain primary. Indeterminate left adrenal mass, left renal mass and cystic pancreatic tail mass on recent CT abdomen study. EXAM: MRI ABDOMEN WITHOUT AND WITH CONTRAST TECHNIQUE: Multiplanar multisequence MR imaging of the abdomen was performed both before and after the administration of intravenous contrast. CONTRAST:  10 cc Gadavist IV. COMPARISON:  01/24/2018 CT chest, abdomen and pelvis. FINDINGS: Significantly motion degraded scan, limiting assessment. Lower chest: Enlarged 1.4 cm left lower posterior mediastinal para-aortic node (series 5/image 4), unchanged from 01/24/2018 CT. Hepatobiliary: Normal liver size and configuration. Mild diffuse hepatic steatosis. No liver mass. Normal gallbladder with no cholelithiasis. No biliary ductal dilatation. Common bile duct diameter 3 mm. No choledocholithiasis. Pancreas: There is a unilocular cystic pancreatic lesion measuring 2.4 x 1.8 x 2.0 cm arising exophytically from the anterior pancreatic tail (series 5/image 12), which demonstrates mild wall thickening and enhancement along the posterior margin, with no internal septations. Subcentimeter simple unilocular nonenhancing pancreatic body cystic lesion. No pancreatic duct dilation. No pancreas divisum. Spleen: Normal size. No mass. Adrenals/Urinary Tract: Normal right adrenal. Left adrenal 4.6 x 3.3 cm mass (series 8/image 18) demonstrates mildly heterogeneous signal intensity on T1 and T2 weighted sequences with heterogeneous enhancement and absence of consistent loss of signal intensity on out of phase chemical shift imaging. No hydronephrosis. Several simple renal cysts in both kidneys, largest 2.7 cm in the interpolar left kidney. There is a 1.4 x 1.1 cm mildly T2 hyperintense and T1 isointense renal cortical lesion in the anterior interpolar left kidney (series 8/image 22) without convincing  enhancement on the limited motion degraded postcontrast sequences, probably a Bosniak category 2 hemorrhagic/proteinaceous renal cyst. No overtly suspicious renal masses. Stomach/Bowel: Normal non-distended stomach. Visualized small and large bowel is normal caliber, with no bowel wall thickening. Vascular/Lymphatic: Normal caliber abdominal aorta. Patent portal, splenic, hepatic and renal veins. No pathologically enlarged lymph nodes in the abdomen. Other: No abdominal ascites or focal fluid collection. Musculoskeletal: No aggressive appearing focal osseous lesions. IMPRESSION: 1. Limited motion degraded scan. 2. Heterogeneous enhancing 4.6 cm left adrenal mass remains indeterminate by MRI, with no convincing intralesional lipid. Left adrenal metastasis cannot be excluded. Tissue sampling may be considered. 3. No overtly suspicious renal masses. Probable 1.4 cm Bosniak category 2 hemorrhagic/proteinaceous renal cyst in the anterior interpolar left kidney, for which follow-up MRI abdomen without and with IV contrast is recommended in 6-12 months given the limitations of this motion degraded scan. 4. 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 No pancreatic duct dilation. GI consultation for consideration of EUS/FNA is indicated. This recommendation follows ACR consensus guidelines: Management of Incidental Pancreatic Cysts: A  White Paper of the ACR Incidental Findings Committee. [place] 1962;22:979-892. 5. Redemonstration of nonspecific mild left lower mediastinal adenopathy. 6. Mild diffuse hepatic steatosis. Electronically Signed   By: Ilona Sorrel M.D.   On: 01/26/2018 18:09   Ct Abdomen Pelvis W Contrast  Result Date: 01/24/2018 CLINICAL DATA:  Evaluate for metastatic disease. Initial cancer workup. EXAM: CT CHEST, ABDOMEN, AND PELVIS WITH CONTRAST TECHNIQUE: Multidetector CT imaging of the chest,  abdomen and pelvis was performed following the standard protocol during bolus administration of intravenous contrast. CONTRAST:  111mL OMNIPAQUE IOHEXOL 300 MG/ML  SOLN COMPARISON:  None. FINDINGS: CT CHEST FINDINGS Cardiovascular: Heart size appears within normal limits. No pericardial effusion identified. Aortic atherosclerosis. Calcifications within the LAD and RCA coronary arteries noted. Mediastinum/Nodes: Normal appearance of the thyroid gland. The trachea appears patent and is midline. No axillary or supraclavicular adenopathy. Within the left posterior mediastinum there is a lymph node measuring 1.4 cm, image 37/3. Lungs/Pleura: No pleural effusion, airspace consolidation or atelectasis. Pulmonary nodule within the posterior costophrenic sulcus measures 9 mm, image 124/5. Also within the posterior left costophrenic sulcus is a 8 mm lung nodule, image 115/5. Musculoskeletal: No chest wall mass or suspicious bone lesions identified. CT ABDOMEN PELVIS FINDINGS Hepatobiliary: No focal liver abnormality is seen. No gallstones, gallbladder wall thickening, or biliary dilatation. Pancreas: Cystic lesion arising from the tail of pancreas measures 1.9 by 2.2 cm, image 52/3. No inflammation or main duct dilatation. Spleen: Spleen is normal. Adrenals/Urinary Tract: Normal appearance of the right adrenal gland. Left adrenal nodule measures 4.4 by 3.3 by 5.0 cm and measures 46 HU. Normal appearance of the right kidney. Multiple exophytic cysts are noted arising from the upper and lower pole of left kidney. Indeterminate exophytic lesion arising from the anterior cortex of the interpolar left kidney measures 1 cm and 49 HU, image 64/3. No hydronephrosis identified bilaterally. No focal bladder lesion identified. Stomach/Bowel: Stomach appears normal. The small bowel loops have a normal course and caliber. No pathologic dilatation of the colon. Distal colonic diverticula noted without acute inflammation.  Vascular/Lymphatic: Aortic atherosclerosis. No aneurysm. No abdominopelvic adenopathy. Reproductive: Prostate is unremarkable. Other: No abdominal wall hernia or abnormality. No abdominopelvic ascites. Musculoskeletal: Spondylosis noted within the lumbar spine. No aggressive lytic or sclerotic bone lesion. IMPRESSION: 1. No definite primary neoplasm identified. Several nonspecific findings are noted within the chest in abdomen including a 5 cm left adrenal mass. This may represent a benign or malignant tumor. Metastatic disease not excluded. More definitive characterization could be obtained with contrast enhanced abdominal MRI. 2. Small hyperdense left kidney lesion measures 1 cm and 49 HU. This may represent either and small enhancing neoplasm or hemorrhagic/proteinaceous cyst. This could also be further characterized with contrast enhanced abdominal MRI. 3. Single enlarged left posterior mediastinal lymph node measuring 1.4 cm. 4. Small exophytic cystic lesion arising from tail of pancreas measures 2.2 cm. Further characterization with contrast enhanced MR of the abdomen is advised. 5. 2 small nodules noted within the posterior costophrenic sulcus within the left lower lobe, nonspecific. 6. As an alternative to further imaging with contrast enhanced abdominal MRI a PET-CT may be considered to assess for areas of hypermetabolism which may help guide biopsy of any suspicious hypermetabolic lesions. Electronically Signed   By: Kerby Moors M.D.   On: 01/24/2018 21:32    Labs:  CBC: Recent Labs    12/02/17 1628 01/22/18 2358 01/24/18 0533 01/25/18 0456  WBC 8.8 11.3* 9.1 6.0  HGB 14.1  14.4 15.2 15.7  HCT 44.3 44.3 47.1 48.5  PLT 252 242 262 284    COAGS: Recent Labs    11/24/17 1744  11/25/17 0240 11/25/17 1002 11/26/17 0353 01/22/18 2358  INR 1.99   < > 1.04 1.11 1.01 1.00  APTT 39*  --   --   --   --  28   < > = values in this interval not displayed.    BMP: Recent Labs     12/02/17 1628 01/22/18 2358 01/24/18 0533 01/25/18 0456  NA 135 135 134* 133*  K 4.0 3.9 3.9 4.1  CL 103 101 101 100  CO2 22 23 23  20*  GLUCOSE 91 110* 104* 183*  BUN 13 13 12 12   CALCIUM 9.0 9.1 9.1 9.4  CREATININE 0.88 0.97 0.87 1.13  GFRNONAA >60 >60 >60 >60  GFRAA >60 >60 >60 >60    LIVER FUNCTION TESTS: Recent Labs    02/06/17 0634 05/13/17 1105 12/02/17 1628 01/22/18 2358  BILITOT 0.5 0.3 0.4 0.9  AST 15 7 20 27   ALT 19 20 25  36  ALKPHOS 51 59 56 66  PROT 6.5 6.7 6.9 7.7  ALBUMIN 2.7* 4.1 3.8 4.3    TUMOR MARKERS: No results for input(s): AFPTM, CEA, CA199, CHROMGRNA in the last 8760 hours.  Assessment and Plan:  Patient with extensive neurologic history including craniotomy, multifocal acute intraparenchymal hemorrhage and intraventricular hemorrhage who presented recently for ongoing headaches - workup showed multiple brain masses consistent with metastases. Further imaging was performed to attempt to locate primary neoplasm however nothing definitive was seen. However, CT abdomen/pelvis showed 5 cm left adrenal mass which may represent a benign or malignant tumor. Request has been made to IR for biopsy of this adrenal mass. Patient has been reviewed with Dr. Pascal Lux who agrees to procedure.  Biopsy planned for 12/4 pending any urgent procedures. Patient to be NPO after midnight, pre-procedure labs will be drawn in the morning, no heparin/lovenox.   Risks and benefits discussed with the patient and his family including, but not limited to bleeding, infection, damage to adjacent structures or low yield requiring additional tests.  All of the patient's questions were answered, patient is agreeable to proceed.  Consent signed and in chart.  Thank you for this interesting consult.  I greatly enjoyed meeting BILLAL ROLLO and look forward to participating in their care.  A copy of this report was sent to the requesting provider on this date.  Electronically  Signed: Joaquim Nam, PA-C 01/28/2018, 3:29 PM   I spent a total of 40 Minutes  in face to face in clinical consultation, greater than 50% of which was counseling/coordinating care for left adrenal mass biopsy.

## 2018-01-29 ENCOUNTER — Telehealth: Payer: Self-pay | Admitting: Family Medicine

## 2018-01-29 ENCOUNTER — Other Ambulatory Visit: Payer: Self-pay | Admitting: Radiation Therapy

## 2018-01-29 ENCOUNTER — Inpatient Hospital Stay (HOSPITAL_COMMUNITY): Payer: PPO

## 2018-01-29 LAB — BASIC METABOLIC PANEL
Anion gap: 9 (ref 5–15)
BUN: 23 mg/dL (ref 8–23)
CO2: 23 mmol/L (ref 22–32)
Calcium: 8.5 mg/dL — ABNORMAL LOW (ref 8.9–10.3)
Chloride: 102 mmol/L (ref 98–111)
Creatinine, Ser: 1.01 mg/dL (ref 0.61–1.24)
GFR calc Af Amer: >60 mL/min (ref >60)
GFR calc non Af Amer: >60 mL/min (ref >60)
Glucose, Bld: 133 mg/dL — ABNORMAL HIGH (ref 70–99)
Potassium: 4.3 mmol/L (ref 3.5–5.1)
Sodium: 134 mmol/L — ABNORMAL LOW (ref 135–145)

## 2018-01-29 LAB — CBC
HCT: 45.1 % (ref 39.0–52.0)
Hemoglobin: 14.8 g/dL (ref 13.0–17.0)
MCH: 28.4 pg (ref 26.0–34.0)
MCHC: 32.8 g/dL (ref 30.0–36.0)
MCV: 86.4 fL (ref 80.0–100.0)
Platelets: 272 10*3/uL (ref 150–400)
RBC: 5.22 MIL/uL (ref 4.22–5.81)
RDW: 13.3 % (ref 11.5–15.5)
WBC: 10.9 10*3/uL — ABNORMAL HIGH (ref 4.0–10.5)
nRBC: 0 % (ref 0.0–0.2)

## 2018-01-29 LAB — PROTIME-INR
INR: 1.04
Prothrombin Time: 13.5 seconds (ref 11.4–15.2)

## 2018-01-29 MED ORDER — MIDAZOLAM HCL 2 MG/2ML IJ SOLN
INTRAMUSCULAR | Status: AC
  Filled 2018-01-29: qty 4

## 2018-01-29 MED ORDER — GELATIN ABSORBABLE 12-7 MM EX MISC
CUTANEOUS | Status: AC
  Filled 2018-01-29: qty 1

## 2018-01-29 MED ORDER — LIDOCAINE HCL 1 % IJ SOLN
INTRAMUSCULAR | Status: AC
  Filled 2018-01-29: qty 20

## 2018-01-29 MED ORDER — FENTANYL CITRATE (PF) 100 MCG/2ML IJ SOLN
INTRAMUSCULAR | Status: AC | PRN
  Administered 2018-01-29: 50 ug via INTRAVENOUS

## 2018-01-29 MED ORDER — MIDAZOLAM HCL 2 MG/2ML IJ SOLN
INTRAMUSCULAR | Status: AC | PRN
  Administered 2018-01-29: 1 mg via INTRAVENOUS

## 2018-01-29 MED ORDER — FENTANYL CITRATE (PF) 100 MCG/2ML IJ SOLN
INTRAMUSCULAR | Status: AC
  Filled 2018-01-29: qty 4

## 2018-01-29 MED ORDER — DEXAMETHASONE 4 MG PO TABS: 4.0000 mg | ORAL_TABLET | Freq: Every day | ORAL | 0 refills | Status: AC

## 2018-01-29 NOTE — Progress Notes (Signed)
PROGRESS NOTE    [name] [name]  TOI:[id number] DOB: 08-Jan-1941 DOA: 01/22/2018 PCP: Dettinger, [name], MD   Brief Narrative: Patient is a 77 year old man with past medical history of A. fib not on anti-coagulation, subdural hemorrhage, hypertension, hyperlipidemia, hypothyroidism who presented with 2 weeks history of headache. MRI brain showed intracranial hemorrhage with multiple hemorrhagic masses with localized edema but no midline shift.  There was also presence of small intraventricular hemorrhage,subarachnoid hemorrhage. Oncology consulted for the metastatic process.Started on decadron. We have consulted radiation oncology and neuro-oncology .  MRI of the abdomen with and without contrast ordered for further evaluation of adrenal mass and it confirmed 4.6 cm adrenal mass.  Plan is to do a CT-guided biopsy of the left adrenal mass today by IR.   Assessment & Plan:   Principal Problem:   ICH (intracerebral hemorrhage) (HCC) Active Problems:   Hypothyroidism   Essential hypertension   Paroxysmal atrial fibrillation (HCC)   Headache   Precancerous skin lesion   Brain metastases (HCC)   Lesion of adrenal gland (HCC)  Multifocal intracerebral hemorrhage with surrounding vasogenic cerebral edema: Looks like  hemorrhagic metastatic diesase.  Origin is unknown at this time. Oncology was consulted and was recommended to get a CT chest/abdomen/pelvis which showed multiple abnormalities suggesting metastatic disease but no primary.it showed 5 cm left adrenal mass and lymph node enlargements. We consulted radiation oncology ,neuro Oncology Dr Mickeal Skinner. MRI of the abdomen with and without contrast ordered for further evaluation of adrenal mass which showed 4.6 cm left renal mass and 2.4 cm anterior paratracheal mass ,cannot exclude malignancy .Plan for  adrenal biopsy by IR today. Will follow up biopsy report.  I will talk to Dr. Mickeal Skinner if he can be discharged after the biopsy so that he can  follow-up  an outpatient.  Headache: Much better now.  Continue dexamethasone. D/Ced Topamax.  A. fib: Was On Cardizem and flecainide.  Not on anticoagulation.  Rate controlled.  Hypertension: BP soft. Cardizem and lisinopril on hold.  Hypothyroidism: Continue Synthyroid  Hyperlipidemia: Continue statin  Deconditioning/debility: Patient evaluated by physical therapy and recommended home health on discharge.   DVT prophylaxis: SCD Code Status: Full Family Communication:  Disposition Plan:Biposy today. Likely home today or tomorrow  Consultants: Oncology  Procedures:None,pending left adrenal mass biopsy  Antimicrobials:None  Subjective: Patient seen and examined the bedside this afternoon.  Remains comfortable.  No complaints. Awaiting biopsy   Objective: Vitals:   01/28/18 1933 01/29/18 0010 01/29/18 0349 01/29/18 0748  BP: 133/75 135/69 133/67 128/60  Pulse: 64 66 69 (!) 51  Resp: 18 18 20    Temp: 97.9 F (36.6 C) 98 F (36.7 C) 98.1 F (36.7 C) 97.9 F (36.6 C)  TempSrc: Oral Oral Oral Axillary  SpO2: 96% 98% 97% 95%  Weight:      Height:       No intake or output data in the 24 hours ending 01/29/18 8338 Filed Weights   01/22/18 2256  Weight: 124.7 kg    Examination:  General exam: Appears calm and comfortable ,Not in distress,obese  HEENT:left eye blindness,Oral mucosa moist, Ear/Nose normal on gross exam Respiratory system: Bilateral equal air entry, normal vesicular breath sounds, no wheezes or crackles  Cardiovascular system: S1 & S2 heard, RRR. No JVD, murmurs, rubs, gallops or clicks. No pedal edema. Gastrointestinal system: Abdomen is nondistended, soft and nontender. No organomegaly or masses felt. Normal bowel sounds heard. Central nervous system: Alert and oriented. No focal neurological deficits. Extremities: No edema,  no clubbing ,no cyanosis, distal peripheral pulses palpable. Skin: Multiple chronic  lesions including senile purpura on the  skin MSK: Normal muscle bulk,tone ,power Psychiatry: Judgement and insight appear normal. Mood & affect appropriate.     Data Reviewed: I have personally reviewed following labs and imaging studies  CBC: Recent Labs  Lab 01/22/18 2358 01/24/18 0533 01/25/18 0456 01/29/18 0426  WBC 11.3* 9.1 6.0 10.9*  NEUTROABS 7.3  --   --   --   HGB 14.4 15.2 15.7 14.8  HCT 44.3 47.1 48.5 45.1  MCV 89.0 87.9 87.9 86.4  PLT 242 262 284 338   Basic Metabolic Panel: Recent Labs  Lab 01/22/18 2358 01/24/18 0533 01/25/18 0456 01/29/18 0426  NA 135 134* 133* 134*  K 3.9 3.9 4.1 4.3  CL 101 101 100 102  CO2 23 23 20* 23  GLUCOSE 110* 104* 183* 133*  BUN 13 12 12 23   CREATININE 0.97 0.87 1.13 1.01  CALCIUM 9.1 9.1 9.4 8.5*   GFR: Estimated Creatinine Clearance: 82.5 mL/min (by C-G formula based on SCr of 1.01 mg/dL). Liver Function Tests: Recent Labs  Lab 01/22/18 2358  AST 27  ALT 36  ALKPHOS 66  BILITOT 0.9  PROT 7.7  ALBUMIN 4.3   No results for input(s): LIPASE, AMYLASE in the last 168 hours. No results for input(s): AMMONIA in the last 168 hours. Coagulation Profile: Recent Labs  Lab 01/22/18 2358 01/29/18 0426  INR 1.00 1.04   Cardiac Enzymes: No results for input(s): CKTOTAL, CKMB, CKMBINDEX, TROPONINI in the last 168 hours. BNP (last 3 results) No results for input(s): PROBNP in the last 8760 hours. HbA1C: No results for input(s): HGBA1C in the last 72 hours. CBG: No results for input(s): GLUCAP in the last 168 hours. Lipid Profile: No results for input(s): CHOL, HDL, LDLCALC, TRIG, CHOLHDL, LDLDIRECT in the last 72 hours. Thyroid Function Tests: No results for input(s): TSH, T4TOTAL, FREET4, T3FREE, THYROIDAB in the last 72 hours. Anemia Panel: No results for input(s): VITAMINB12, FOLATE, FERRITIN, TIBC, IRON, RETICCTPCT in the last 72 hours. Sepsis Labs: No results for input(s): PROCALCITON, LATICACIDVEN in the last 168 hours.  Recent Results (from the  past 240 hour(s))  MRSA PCR Screening     Status: None   Collection Time: 01/23/18  2:25 PM  Result Value Ref Range Status   MRSA by PCR NEGATIVE NEGATIVE Final    Comment:        The GeneXpert MRSA Assay (FDA approved for NASAL specimens only), is one component of a comprehensive MRSA colonization surveillance program. It is not intended to diagnose MRSA infection nor to guide or monitor treatment for MRSA infections. Performed at [facility], [place] [address], [place], [place] [address]          Radiology Studies: No results found.      Scheduled Meds: . lidocaine      . atorvastatin  10 mg Oral Daily  . dexamethasone  4 mg Oral Q12H  . flecainide  100 mg Oral BID  . levothyroxine  100 mcg Oral QAC breakfast  . pantoprazole  40 mg Oral Daily  . senna-docusate  1 tablet Oral BID  . sodium chloride flush  3 mL Intravenous Q12H  . sodium chloride flush  3 mL Intravenous Q12H   Continuous Infusions: . sodium chloride       LOS: 6 days    Time spent: 35 mins.More than 50% of that time was spent in counseling and/or  coordination of care.      Shelly Coss, MD [facility] Pager [phone]  If 7PM-7AM, please contact night-coverage [email or web address] Password [email or web address] 01/29/2018, 8:32 AM

## 2018-01-29 NOTE — Progress Notes (Signed)
Transported to lobby per wc at [facility] in no obvious distress. bandaid in place to lower back, no drainage present at biopsy site. No discomfort noted.

## 2018-01-29 NOTE — Care Management Note (Signed)
Case Management Note  Patient Details  Name: [name] [name] MRN: [id number] Date of Birth: [birth year]  Subjective/Objective:                    Action/Plan: Pt discharging home with [facility]. Butch Penny with [facility] aware of the d/c.  Pt has transportation home.  Expected Discharge Date:  01/29/18               Expected Discharge Plan:  [place]  In-House Referral:     Discharge planning Services  CM Consult  Post Acute Care Choice:  Home Health Choice offered to:  Patient  DME Arranged:    DME Agency:     HH Arranged:  PT, OT, Speech Therapy [place] Agency:  [place]  Status of Service:  Completed, signed off  If discussed at [place] of Stay Meetings, dates discussed:    Additional Comments:  Pollie Friar, RN 01/29/2018, 12:46 PM

## 2018-01-29 NOTE — Procedures (Signed)
Pre procedural Dx: Left Adrenal mass  Post procedural Dx: Same  Technically successful CT guided biopsy of indeterminate left adrenal glad mass.   EBL: None.   Complications: None immediate.   Ronny Bacon, MD Pager #: [phone]

## 2018-01-29 NOTE — Telephone Encounter (Signed)
appt made for 02/04/18 pt aware

## 2018-01-29 NOTE — Telephone Encounter (Signed)
This pt wants to follow up with you, can I put him in on Acute care day or should I schedule him with someone else.

## 2018-01-29 NOTE — Discharge Summary (Signed)
Physician Discharge Summary  [name] [name] UEA:[id number] DOB: 1940/05/02 DOA: 01/22/2018  PCP: Dettinger, [name], MD  Admit date: 01/22/2018 Discharge date: 01/29/2018  Admitted From: Home Disposition:  Home  Discharge Condition:Stable CODE STATUS:FULL Diet recommendation: Heart Healthy  Brief/Interim Summary: Patient is a 77 year old man with past medical history of A. fib not on anti-coagulation, subdural hemorrhage, hypertension, hyperlipidemia, hypothyroidism who presented with 2 weeks history of headache. MRI brain showed intracranial hemorrhage with multiple hemorrhagic masses with localized edema but no midline shift.  There was also presence of small intraventricular hemorrhage,subarachnoid hemorrhage. Oncology consulted for the metastatic process.Started on decadron. We have consulted radiation oncology and neuro-oncology .  MRI of the abdomen with and without contrast ordered for further evaluation of adrenal mass and it confirmed 4.6 cm adrenal mass.  Underwent  CT-guided biopsy of the left adrenal mass today by IR. He is stable for discharge to home today.  He will be called for appointment by oncology/radiation oncology after biopsy report.   Following problems were addressed during his  hospitalization:  Multifocal intracerebral hemorrhage with surrounding vasogenic cerebral edema: Looks like  hemorrhagic metastatic diesase.  Origin is unknown at this time. Oncology was consulted and was recommended to get a CT chest/abdomen/pelvis which showed multiple abnormalities suggesting metastatic disease but no primary.it showed 5 cm left adrenal mass and lymph node enlargements. We consulted radiation oncology ,neuro Oncology Dr Mickeal Skinner. MRI of the abdomen with and without contrast ordered for further evaluation of adrenal mass which showed 4.6 cm left renal mass and 2.4 cm anterior paratracheal mass ,cannot exclude malignancy Underewent  adrenal biopsy by IR today. Will follow up  biopsy report.  Discussed with Dr. Mickeal Skinner about discharging him after the biopsy so that he can follow-up  an outpatient.  Headache: Much better now.  Continue dexamethasone. D/Ced Topamax.  A. fib: Was On Cardizem and flecainide.  Not on anticoagulation.  Rate controlled.  Hypertension: BP soft. Cardizem and lisinopril stopped.  Continue to monitor blood pressure at home and follow-up with PCP.  Hypothyroidism: Continue Synthyroid  Hyperlipidemia: Continue statin  Deconditioning/debility: Patient evaluated by physical therapy,speech and recommended home health on discharge.    Discharge Diagnoses:  Principal Problem:   ICH (intracerebral hemorrhage) (HCC) Active Problems:   Hypothyroidism   Essential hypertension   Paroxysmal atrial fibrillation (HCC)   Headache   Precancerous skin lesion   Brain metastases (HCC)   Lesion of adrenal gland [facility])    Discharge Instructions  Discharge Instructions    Ambulatory referral to Neurology   Complete by:  As directed    Follow up with stroke clinic NP (Jessica Vanschaick or Cecille Rubin, if both not available, consider Dr. Antony Contras, Dr. Bess Harvest, or Dr. Sarina Ill) at [facility] in about 4 weeks.   Diet - low sodium heart healthy   Complete by:  As directed    Discharge instructions   Complete by:  As directed    1) Take prescribed medication as instructed. 2) Follow up with your PCP in a week.  Monitor your blood pressure at home. 3) You will be called by oncology and radiation oncology for further appointments.   Increase activity slowly   Complete by:  As directed      Allergies as of 01/29/2018   No Known Allergies     Medication List    STOP taking these medications   diltiazem 360 MG 24 hr capsule Commonly known as:  CARDIZEM CD   lisinopril  20 MG tablet Commonly known as:  PRINIVIL,ZESTRIL     TAKE these medications   albuterol 108 (90 Base) MCG/ACT inhaler Commonly  known as:  PROVENTIL HFA;VENTOLIN HFA Inhale 2 puffs into the lungs every 6 (six) hours as needed for wheezing or shortness of breath.   atorvastatin 10 MG tablet Commonly known as:  LIPITOR Take 1 tablet (10 mg total) by mouth daily.   dexamethasone 4 MG tablet Commonly known as:  DECADRON Take 1 tablet (4 mg total) by mouth daily. Start taking on:  01/30/2018   flecainide 100 MG tablet Commonly known as:  TAMBOCOR TAKE 1 TABLET (100 MG TOTAL) BY MOUTH 2 (TWO) TIMES DAILY. What changed:  See the new instructions.   levothyroxine 50 MCG tablet Commonly known as:  SYNTHROID, LEVOTHROID TAKE 2 TABLETS BY MOUTH DAILY What changed:  when to take this   metFORMIN 500 MG 24 hr tablet Commonly known as:  GLUCOPHAGE-XR Take 1 tablet (500 mg total) by mouth daily with breakfast. (Need to be seen)      Follow-up Information    [facility] Follow up in 4 week(s).   Specialty:  Neurology Why:  office will call with appt date and time Contact information: [address] [place] [phone]       Dettinger, [name], MD. Schedule an appointment as soon as possible for a visit in 1 week(s).   Specialties:  Family Medicine, Cardiology Contact information: [place] [address] [phone]          No Known Allergies  Consultations: Oncology, neurology  Procedures/Studies: Ct Head Wo Contrast  Result Date: 01/24/2018 CLINICAL DATA:  Follow-up examination for acute stroke. EXAM: CT HEAD WITHOUT CONTRAST TECHNIQUE: Contiguous axial images were obtained from the base of the skull through the vertex without intravenous contrast. COMPARISON:  Previous CT from 01/22/2018 as well as brain MRI from 01/23/2018. FINDINGS: Brain: Multiple hemorrhagic brain masses again seen, relatively stable and unchanged in size as compared to most recent CT. Largest right frontal lobe mass measures 3.6 x 2.4 cm. Dominant left  frontal lesion measures 2.7 x 2.8 cm. Mass at the left gyrus rectus measures 1.4 x 1.2 cm. Additional right frontal operculum lower lesion measures 1.5 x 1.5 cm. Surrounding vasogenic edema about several of these lesions, most notable at the left frontal lesion. No midline shift. Basilar cisterns remain patent. Cystic encephalomalacia at the adjacent right frontal lesion with is small amount of layering hemorrhage again noted. Intraventricular extension with small amount of blood seen layering within the occipital horns, also unchanged. No other new intracranial hemorrhage or mass lesion identified. No acute large vessel territory infarct. No extra-axial fluid collection. Atrophy with chronic microvascular ischemic disease again noted. Vascular: No hyperdense vessel. Scattered vascular calcifications noted within the carotid siphons. Skull: Scalp soft tissues demonstrate no acute finding. Sequelae of prior bilateral burr hole craniotomy. Sinuses/Orbits: Globes orbital soft tissues within normal limits. Patient status post bilateral ocular lens replacement. Paranasal sinuses remain largely clear. Small bilateral maxillary sinus retention cyst noted. Small bilateral mastoid effusions noted. Other: None. IMPRESSION: 1. Relatively stable appearance of multifocal hemorrhagic masses with localized edema as above. No midline shift. 2. Associated small volume intraventricular hemorrhage, relatively unchanged. Stable ventricular size without hydrocephalus. 3. No other new acute intracranial abnormality. Electronically Signed   By: Jeannine Boga M.D.   On: 01/24/2018 02:17   Ct Head Wo Contrast  Result Date: 01/23/2018 CLINICAL DATA:  Initial evaluation for acute headache, recent intracranial hemorrhages. EXAM: CT HEAD WITHOUT CONTRAST TECHNIQUE: Contiguous axial images were obtained from the base of the skull through the vertex without intravenous contrast. COMPARISON:  Prior CT from 12/17/2017. FINDINGS: Brain:  Since the previous exam, the right frontal intraparenchymal hemorrhage has enlarged in size now measuring 3.5 x 2.4 x 1.9 cm (series 2, image 25). Localized edema within this region overall slightly improved. Adjacent cystic encephalomalacia of now contains a small amount of layering hemorrhage (series 2, image 22). Additional left frontal intraparenchymal hematoma also increased in size now measuring 3.1 x 2.5 x 2.5 cm (series 2, image 22, previously 1.8 cm). Localized vasogenic edema slightly worsened from previous. Additional right frontotemporal hemorrhage also increased in size now measuring 1.5 x 1.7 x 1.1 cm (series 2, image 17). Localized vasogenic edema has worsened without significant regional mass effect. Additional soft tissue density at the parasagittal aspect of the anterior inferior left frontal lobe measures 1.5 x 1.2 x 1.3 cm with mild localized edema without midline shift. This may have been present on prior CT, although difficult to see on prior exam. Finding also likely reflects a small intraparenchymal hemorrhage. New small volume intraventricular hemorrhage with blood seen layering within the occipital horns of both lateral ventricles, likely via extension from the right frontal ventricular size is relatively stable without interval hydrocephalus. Basilar cisterns remain patent. No other acute intracranial hemorrhage. No subarachnoid or extra-axial hemorrhage appreciated. No acute large vessel territory infarct. Underlying cerebral atrophy, stable. Vascular: No hyperdense vessel. Scattered vascular calcifications noted within the carotid siphons. Skull: Scalp soft tissues demonstrate no acute abnormality. Sequelae of prior bilateral burr hole craniotomy. Sinuses/Orbits: Globes and orbital soft tissues within normal limits. Small bilateral maxillary sinus retention cyst noted. Paranasal sinuses are otherwise clear. Small chronic right mastoid effusion noted. Other: None. IMPRESSION: 1. Interval  increase in size of bilateral intraparenchymal hematomas as above. Localized vasogenic edema without significant midline shift. 2. New small volume intraventricular hemorrhage, likely via extension from the right frontal hematoma and adjacent cystic encephalomalacia. Stable ventricular size without hydrocephalus or ventricular trapping at this time. Critical Value/emergent results were called by telephone at the time of interpretation on 01/23/2018 at 12:13 am to Dr. Rolland Porter , who verbally acknowledged these results. Electronically Signed   By: Jeannine Boga M.D.   On: 01/23/2018 00:18   Ct Chest W Contrast  Result Date: 01/24/2018 CLINICAL DATA:  Evaluate for metastatic disease. Initial cancer workup. EXAM: CT CHEST, ABDOMEN, AND PELVIS WITH CONTRAST TECHNIQUE: Multidetector CT imaging of the chest, abdomen and pelvis was performed following the standard protocol during bolus administration of intravenous contrast. CONTRAST:  127mL OMNIPAQUE IOHEXOL 300 MG/ML  SOLN COMPARISON:  None. FINDINGS: CT CHEST FINDINGS Cardiovascular: Heart size appears within normal limits. No pericardial effusion identified. Aortic atherosclerosis. Calcifications within the LAD and RCA coronary arteries noted. Mediastinum/Nodes: Normal appearance of the thyroid gland. The trachea appears patent and is midline. No axillary or supraclavicular adenopathy. Within the left posterior mediastinum there is a lymph node measuring 1.4 cm, image 37/3. Lungs/Pleura: No pleural effusion, airspace consolidation or atelectasis. Pulmonary nodule within the posterior costophrenic sulcus measures 9 mm, image 124/5. Also within the posterior left costophrenic sulcus is a 8 mm lung nodule, image 115/5. Musculoskeletal: No chest wall mass or suspicious bone lesions identified. CT ABDOMEN PELVIS FINDINGS Hepatobiliary: No focal liver abnormality is seen. No gallstones, gallbladder wall thickening, or biliary dilatation. Pancreas: Cystic lesion  arising from the tail of  pancreas measures 1.9 by 2.2 cm, image 52/3. No inflammation or main duct dilatation. Spleen: Spleen is normal. Adrenals/Urinary Tract: Normal appearance of the right adrenal gland. Left adrenal nodule measures 4.4 by 3.3 by 5.0 cm and measures 46 HU. Normal appearance of the right kidney. Multiple exophytic cysts are noted arising from the upper and lower pole of left kidney. Indeterminate exophytic lesion arising from the anterior cortex of the interpolar left kidney measures 1 cm and 49 HU, image 64/3. No hydronephrosis identified bilaterally. No focal bladder lesion identified. Stomach/Bowel: Stomach appears normal. The small bowel loops have a normal course and caliber. No pathologic dilatation of the colon. Distal colonic diverticula noted without acute inflammation. Vascular/Lymphatic: Aortic atherosclerosis. No aneurysm. No abdominopelvic adenopathy. Reproductive: Prostate is unremarkable. Other: No abdominal wall hernia or abnormality. No abdominopelvic ascites. Musculoskeletal: Spondylosis noted within the lumbar spine. No aggressive lytic or sclerotic bone lesion. IMPRESSION: 1. No definite primary neoplasm identified. Several nonspecific findings are noted within the chest in abdomen including a 5 cm left adrenal mass. This may represent a benign or malignant tumor. Metastatic disease not excluded. More definitive characterization could be obtained with contrast enhanced abdominal MRI. 2. Small hyperdense left kidney lesion measures 1 cm and 49 HU. This may represent either and small enhancing neoplasm or hemorrhagic/proteinaceous cyst. This could also be further characterized with contrast enhanced abdominal MRI. 3. Single enlarged left posterior mediastinal lymph node measuring 1.4 cm. 4. Small exophytic cystic lesion arising from tail of pancreas measures 2.2 cm. Further characterization with contrast enhanced MR of the abdomen is advised. 5. 2 small nodules noted within the  posterior costophrenic sulcus within the left lower lobe, nonspecific. 6. As an alternative to further imaging with contrast enhanced abdominal MRI a PET-CT may be considered to assess for areas of hypermetabolism which may help guide biopsy of any suspicious hypermetabolic lesions. Electronically Signed   By: Kerby Moors M.D.   On: 01/24/2018 21:32   Mr Jeri Cos VF Contrast  Result Date: 01/23/2018 CLINICAL DATA:  Headaches.  Intracranial hemorrhages. EXAM: MRI HEAD WITHOUT AND WITH CONTRAST TECHNIQUE: Multiplanar, multiecho pulse sequences of the brain and surrounding structures were obtained without and with intravenous contrast. CONTRAST:  10 mL Gadavist COMPARISON:  Head CT 01/22/2018 and MRI 12/12/2017 FINDINGS: Brain: A hemorrhagic right frontal mass demonstrates solid enhancement and measures 3.2 x 2.7 cm (series 15, image 130). A cystic component or separate cystic lesion/cystic encephalomalacia inferior to the solid mass measures 4.2 x 2.6 cm and demonstrates a small amount of peripheral linear and nodular enhancement (series 15, image 116). A 1.7 cm hemorrhagic right frontal lobe mass anterior to the insula has enlarged from the prior MRI and now clearly enhances (series 15, image 90). A 1.2 cm enhancing mass in the anteromedial left frontal lobe is new or larger compared to the prior MRI (series 15, image 89). A 2.6 x 2.6 cm hemorrhagic lateral left frontal lobe mass has enlarged from the prior MRI and now also clearly has solid enhancing components. A 6 mm ring-enhancing inferior right frontal lesion is new (series 15, image 79). Motion artifact could obscure additional small enhancing lesions. There is moderate vasogenic edema associated with the lateral left frontal mass with milder edema associated with other bilateral frontal masses. As seen on the recent head CT, there is a small amount of blood in the occipital horns of the lateral ventricles. There is also trace subarachnoid hemorrhage  posteriorly in the sylvian fissures. There is not a  significant chronic microhemorrhage burden to suggest cerebral amyloid angiopathy. There is moderate cerebral atrophy, and there is ex vacuo dilatation of the right frontal horn. There is no evidence of acute infarct, midline shift, or extra-axial fluid collection. Vascular: Major intracranial vascular flow voids are preserved. Skull and upper cervical spine: Bilateral burr holes related to prior subdural hematoma evacuation. No suspicious marrow lesion. Sinuses/Orbits: Bilateral cataract extraction. Small bilateral maxillary sinus mucous retention cysts. Small bilateral mastoid effusions. Other: None. IMPRESSION: 1. Multiple brain masses, many of which are hemorrhagic and now clearly demonstrate enhancement. These are consistent with metastases. 2. Up to moderate associated vasogenic edema.  No midline shift. 3. Small volume intraventricular and subarachnoid hemorrhage. Electronically Signed   By: Logan Bores M.D.   On: 01/23/2018 17:51   Mr Abdomen W NK Contrast  Result Date: 01/26/2018 CLINICAL DATA:  Inpatient. Multiple enhancing hemorrhagic bilateral cerebral masses compatible with metastatic disease of uncertain primary. Indeterminate left adrenal mass, left renal mass and cystic pancreatic tail mass on recent CT abdomen study. EXAM: MRI ABDOMEN WITHOUT AND WITH CONTRAST TECHNIQUE: Multiplanar multisequence MR imaging of the abdomen was performed both before and after the administration of intravenous contrast. CONTRAST:  10 cc Gadavist IV. COMPARISON:  01/24/2018 CT chest, abdomen and pelvis. FINDINGS: Significantly motion degraded scan, limiting assessment. Lower chest: Enlarged 1.4 cm left lower posterior mediastinal para-aortic node (series 5/image 4), unchanged from 01/24/2018 CT. Hepatobiliary: Normal liver size and configuration. Mild diffuse hepatic steatosis. No liver mass. Normal gallbladder with no cholelithiasis. No biliary ductal dilatation.  Common bile duct diameter 3 mm. No choledocholithiasis. Pancreas: There is a unilocular cystic pancreatic lesion measuring 2.4 x 1.8 x 2.0 cm arising exophytically from the anterior pancreatic tail (series 5/image 12), which demonstrates mild wall thickening and enhancement along the posterior margin, with no internal septations. Subcentimeter simple unilocular nonenhancing pancreatic body cystic lesion. No pancreatic duct dilation. No pancreas divisum. Spleen: Normal size. No mass. Adrenals/Urinary Tract: Normal right adrenal. Left adrenal 4.6 x 3.3 cm mass (series 8/image 18) demonstrates mildly heterogeneous signal intensity on T1 and T2 weighted sequences with heterogeneous enhancement and absence of consistent loss of signal intensity on out of phase chemical shift imaging. No hydronephrosis. Several simple renal cysts in both kidneys, largest 2.7 cm in the interpolar left kidney. There is a 1.4 x 1.1 cm mildly T2 hyperintense and T1 isointense renal cortical lesion in the anterior interpolar left kidney (series 8/image 22) without convincing enhancement on the limited motion degraded postcontrast sequences, probably a Bosniak category 2 hemorrhagic/proteinaceous renal cyst. No overtly suspicious renal masses. Stomach/Bowel: Normal non-distended stomach. Visualized small and large bowel is normal caliber, with no bowel wall thickening. Vascular/Lymphatic: Normal caliber abdominal aorta. Patent portal, splenic, hepatic and renal veins. No pathologically enlarged lymph nodes in the abdomen. Other: No abdominal ascites or focal fluid collection. Musculoskeletal: No aggressive appearing focal osseous lesions. IMPRESSION: 1. Limited motion degraded scan. 2. Heterogeneous enhancing 4.6 cm left adrenal mass remains indeterminate by MRI, with no convincing intralesional lipid. Left adrenal metastasis cannot be excluded. Tissue sampling may be considered. 3. No overtly suspicious renal masses. Probable 1.4 cm Bosniak  category 2 hemorrhagic/proteinaceous renal cyst in the anterior interpolar left kidney, for which follow-up MRI abdomen without and with IV contrast is recommended in 6-12 months given the limitations of this motion degraded scan. 4. 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 No pancreatic duct dilation. GI  consultation for consideration of EUS/FNA is indicated. This recommendation follows ACR consensus guidelines: Management of Incidental Pancreatic Cysts: A White Paper of the ACR Incidental Findings Committee. [place] 4098;11:[id number]. 5. Redemonstration of nonspecific mild left lower mediastinal adenopathy. 6. Mild diffuse hepatic steatosis. Electronically Signed   By: Ilona Sorrel M.D.   On: 01/26/2018 18:09   Ct Abdomen Pelvis W Contrast  Result Date: 01/24/2018 CLINICAL DATA:  Evaluate for metastatic disease. Initial cancer workup. EXAM: CT CHEST, ABDOMEN, AND PELVIS WITH CONTRAST TECHNIQUE: Multidetector CT imaging of the chest, abdomen and pelvis was performed following the standard protocol during bolus administration of intravenous contrast. CONTRAST:  140mL OMNIPAQUE IOHEXOL 300 MG/ML  SOLN COMPARISON:  None. FINDINGS: CT CHEST FINDINGS Cardiovascular: Heart size appears within normal limits. No pericardial effusion identified. Aortic atherosclerosis. Calcifications within the LAD and RCA coronary arteries noted. Mediastinum/Nodes: Normal appearance of the thyroid gland. The trachea appears patent and is midline. No axillary or supraclavicular adenopathy. Within the left posterior mediastinum there is a lymph node measuring 1.4 cm, image 37/3. Lungs/Pleura: No pleural effusion, airspace consolidation or atelectasis. Pulmonary nodule within the posterior costophrenic sulcus measures 9 mm, image 124/5. Also within the posterior left costophrenic sulcus is a 8 mm lung nodule, image 115/5.  Musculoskeletal: No chest wall mass or suspicious bone lesions identified. CT ABDOMEN PELVIS FINDINGS Hepatobiliary: No focal liver abnormality is seen. No gallstones, gallbladder wall thickening, or biliary dilatation. Pancreas: Cystic lesion arising from the tail of pancreas measures 1.9 by 2.2 cm, image 52/3. No inflammation or main duct dilatation. Spleen: Spleen is normal. Adrenals/Urinary Tract: Normal appearance of the right adrenal gland. Left adrenal nodule measures 4.4 by 3.3 by 5.0 cm and measures 46 HU. Normal appearance of the right kidney. Multiple exophytic cysts are noted arising from the upper and lower pole of left kidney. Indeterminate exophytic lesion arising from the anterior cortex of the interpolar left kidney measures 1 cm and 49 HU, image 64/3. No hydronephrosis identified bilaterally. No focal bladder lesion identified. Stomach/Bowel: Stomach appears normal. The small bowel loops have a normal course and caliber. No pathologic dilatation of the colon. Distal colonic diverticula noted without acute inflammation. Vascular/Lymphatic: Aortic atherosclerosis. No aneurysm. No abdominopelvic adenopathy. Reproductive: Prostate is unremarkable. Other: No abdominal wall hernia or abnormality. No abdominopelvic ascites. Musculoskeletal: Spondylosis noted within the lumbar spine. No aggressive lytic or sclerotic bone lesion. IMPRESSION: 1. No definite primary neoplasm identified. Several nonspecific findings are noted within the chest in abdomen including a 5 cm left adrenal mass. This may represent a benign or malignant tumor. Metastatic disease not excluded. More definitive characterization could be obtained with contrast enhanced abdominal MRI. 2. Small hyperdense left kidney lesion measures 1 cm and 49 HU. This may represent either and small enhancing neoplasm or hemorrhagic/proteinaceous cyst. This could also be further characterized with contrast enhanced abdominal MRI. 3. Single enlarged left  posterior mediastinal lymph node measuring 1.4 cm. 4. Small exophytic cystic lesion arising from tail of pancreas measures 2.2 cm. Further characterization with contrast enhanced MR of the abdomen is advised. 5. 2 small nodules noted within the posterior costophrenic sulcus within the left lower lobe, nonspecific. 6. As an alternative to further imaging with contrast enhanced abdominal MRI a PET-CT may be considered to assess for areas of hypermetabolism which may help guide biopsy of any suspicious hypermetabolic lesions. Electronically Signed   By: Kerby Moors M.D.   On: 01/24/2018 21:32       Subjective: Patient seen  and examined the bedside this morning.  Remains hemodynamically stable.  Underwent biopsy.  Stable  for discharge.  Discharge Exam: Vitals:   01/29/18 0945 01/29/18 1123  BP: (!) 108/58 (!) 114/49  Pulse: 62 61  Resp: 13   Temp:  97.8 F (36.6 C)  SpO2: 97% 100%   Vitals:   01/29/18 0925 01/29/18 0930 01/29/18 0945 01/29/18 1123  BP: 102/64 121/66 (!) 108/58 (!) 114/49  Pulse: 63 63 62 61  Resp: 10 14 13    Temp:    97.8 F (36.6 C)  TempSrc:    Axillary  SpO2: 99% 99% 97% 100%  Weight:      Height:        General: Pt is alert, awake, not in acute distress Cardiovascular: RRR, S1/S2 +, no rubs, no gallops Respiratory: CTA bilaterally, no wheezing, no rhonchi Abdominal: Soft, NT, ND, bowel sounds + Extremities: no edema, no cyanosis    The results of significant diagnostics from this hospitalization (including imaging, microbiology, ancillary and laboratory) are listed below for reference.     Microbiology: Recent Results (from the past 240 hour(s))  MRSA PCR Screening     Status: None   Collection Time: 01/23/18  2:25 PM  Result Value Ref Range Status   MRSA by PCR NEGATIVE NEGATIVE Final    Comment:        The GeneXpert MRSA Assay (FDA approved for NASAL specimens only), is one component of a comprehensive MRSA colonization surveillance  program. It is not intended to diagnose MRSA infection nor to guide or monitor treatment for MRSA infections. Performed at [facility], [place] [address], [place], [place] [address]      Labs: BNP (last 3 results) No results for input(s): BNP in the last 8760 hours. Basic Metabolic Panel: Recent Labs  Lab 01/22/18 2358 01/24/18 0533 01/25/18 0456 01/29/18 0426  NA 135 134* 133* 134*  K 3.9 3.9 4.1 4.3  CL 101 101 100 102  CO2 23 23 20* 23  GLUCOSE 110* 104* 183* 133*  BUN 13 12 12 23   CREATININE 0.97 0.87 1.13 1.01  CALCIUM 9.1 9.1 9.4 8.5*   Liver Function Tests: Recent Labs  Lab 01/22/18 2358  AST 27  ALT 36  ALKPHOS 66  BILITOT 0.9  PROT 7.7  ALBUMIN 4.3   No results for input(s): LIPASE, AMYLASE in the last 168 hours. No results for input(s): AMMONIA in the last 168 hours. CBC: Recent Labs  Lab 01/22/18 2358 01/24/18 0533 01/25/18 0456 01/29/18 0426  WBC 11.3* 9.1 6.0 10.9*  NEUTROABS 7.3  --   --   --   HGB 14.4 15.2 15.7 14.8  HCT 44.3 47.1 48.5 45.1  MCV 89.0 87.9 87.9 86.4  PLT 242 262 284 272   Cardiac Enzymes: No results for input(s): CKTOTAL, CKMB, CKMBINDEX, TROPONINI in the last 168 hours. BNP: Invalid input(s): POCBNP CBG: No results for input(s): GLUCAP in the last 168 hours. D-Dimer No results for input(s): DDIMER in the last 72 hours. Hgb A1c No results for input(s): HGBA1C in the last 72 hours. Lipid Profile No results for input(s): CHOL, HDL, LDLCALC, TRIG, CHOLHDL, LDLDIRECT in the last 72 hours. Thyroid function studies No results for input(s): TSH, T4TOTAL, T3FREE, THYROIDAB in the last 72 hours.  Invalid input(s): FREET3 Anemia work up No results for input(s): VITAMINB12, FOLATE, FERRITIN, TIBC, IRON, RETICCTPCT in the last 72 hours. Urinalysis    Component Value Date/Time   COLORURINE YELLOW 01/23/2018 1500  APPEARANCEUR CLEAR 01/23/2018 1500   LABSPEC 1.018 01/23/2018 1500   PHURINE 6.0 01/23/2018 1500    GLUCOSEU NEGATIVE 01/23/2018 1500   HGBUR NEGATIVE 01/23/2018 1500   BILIRUBINUR NEGATIVE 01/23/2018 1500   KETONESUR NEGATIVE 01/23/2018 1500   PROTEINUR NEGATIVE 01/23/2018 1500   UROBILINOGEN 1.0 05/11/2010 1145   NITRITE NEGATIVE 01/23/2018 1500   LEUKOCYTESUR NEGATIVE 01/23/2018 1500   Sepsis Labs Invalid input(s): PROCALCITONIN,  WBC,  LACTICIDVEN Microbiology Recent Results (from the past 240 hour(s))  MRSA PCR Screening     Status: None   Collection Time: 01/23/18  2:25 PM  Result Value Ref Range Status   MRSA by PCR NEGATIVE NEGATIVE Final    Comment:        The GeneXpert MRSA Assay (FDA approved for NASAL specimens only), is one component of a comprehensive MRSA colonization surveillance program. It is not intended to diagnose MRSA infection nor to guide or monitor treatment for MRSA infections. Performed at [facility], [place] [address], [place], [place] [address]     Please note: You were cared for by a hospitalist during your hospital stay. Once you are discharged, your primary care physician will handle any further medical issues. Please note that NO REFILLS for any discharge medications will be authorized once you are discharged, as it is imperative that you return to your primary care physician (or establish a relationship with a primary care physician if you do not have one) for your post hospital discharge needs so that they can reassess your need for medications and monitor your lab values.    Time coordinating discharge: 40 minutes  SIGNED:   Shelly Coss, MD  [facility] 01/29/2018, 11:39 AM Pager [phone]  If 7PM-7AM, please contact night-coverage [email or web address] Password [email or web address]

## 2018-01-29 NOTE — Progress Notes (Signed)
  Speech Language Pathology Treatment: Cognitive-Linquistic  Patient Details Name: EZEKEIL BETHEL MRN: [id number] DOB: 16-Jun-1940 Today's Date: 01/29/2018 Time: 9480-1655 SLP Time Calculation (min) (ACUTE ONLY): 15 min  Assessment / Plan / Recommendation Clinical Impression  Skilled treatment session focused on providing education about previously assessed cognitive function and deficits. Wife present and education provided on increasing support initially and having HHST evaluate pt in his environment for most functional problem solving tasks. All questions answered to their satisfaction. ST to sign off acutely.    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  Repeat CT showed enlargement of prior hemorrhage.  Seen by neurology in the emergency department with recommendation for admission to stepdown unit, no need for ICU, check MRI brain with and without contrast, BP goal less than 140/90.      SLP Plan  Discharge SLP treatment due to (comment)(no further acute services indicated)       Recommendations   HHST                Follow up Recommendations: Home health SLP SLP Visit Diagnosis: Cognitive communication deficit (V74.827) Plan: Discharge SLP treatment due to (comment)(no further acute services indicated)       GO                Seda Kronberg 01/29/2018, 10:06 AM

## 2018-01-29 NOTE — Plan of Care (Signed)
Min assist with adls 

## 2018-01-30 DIAGNOSIS — E785 Hyperlipidemia, unspecified: Secondary | ICD-10-CM | POA: Diagnosis not present

## 2018-01-30 DIAGNOSIS — I10 Essential (primary) hypertension: Secondary | ICD-10-CM | POA: Diagnosis not present

## 2018-01-30 DIAGNOSIS — E279 Disorder of adrenal gland, unspecified: Secondary | ICD-10-CM | POA: Diagnosis not present

## 2018-01-30 DIAGNOSIS — Z96653 Presence of artificial knee joint, bilateral: Secondary | ICD-10-CM | POA: Diagnosis not present

## 2018-01-30 DIAGNOSIS — R51 Headache: Secondary | ICD-10-CM | POA: Diagnosis not present

## 2018-01-30 DIAGNOSIS — L989 Disorder of the skin and subcutaneous tissue, unspecified: Secondary | ICD-10-CM | POA: Diagnosis not present

## 2018-01-30 DIAGNOSIS — G936 Cerebral edema: Secondary | ICD-10-CM | POA: Diagnosis not present

## 2018-01-30 DIAGNOSIS — R7303 Prediabetes: Secondary | ICD-10-CM | POA: Diagnosis not present

## 2018-01-30 DIAGNOSIS — H5462 Unqualified visual loss, left eye, normal vision right eye: Secondary | ICD-10-CM | POA: Diagnosis not present

## 2018-01-30 DIAGNOSIS — T45515D Adverse effect of anticoagulants, subsequent encounter: Secondary | ICD-10-CM | POA: Diagnosis not present

## 2018-01-30 DIAGNOSIS — R599 Enlarged lymph nodes, unspecified: Secondary | ICD-10-CM | POA: Diagnosis not present

## 2018-01-30 DIAGNOSIS — E039 Hypothyroidism, unspecified: Secondary | ICD-10-CM | POA: Diagnosis not present

## 2018-01-30 DIAGNOSIS — I480 Paroxysmal atrial fibrillation: Secondary | ICD-10-CM | POA: Diagnosis not present

## 2018-01-30 DIAGNOSIS — I619 Nontraumatic intracerebral hemorrhage, unspecified: Secondary | ICD-10-CM | POA: Diagnosis not present

## 2018-01-31 ENCOUNTER — Other Ambulatory Visit: Payer: Self-pay

## 2018-01-31 NOTE — Patient Outreach (Signed)
[facility]) Care Management  [place]   01/31/2018  [name] [name] 06-18-1940 [id number]  Successful outreach to [name] [name] wife, [name] [name].  HIPAA identifiers verified.  Reason for referral: 30 day post discharge medication review  Current insurance:HTA  PMHx:  Hypertension, atrial fibrillation, hypothyroidism, pre-diabetes, hyperlipidemia, h/o subdural hemorrhage and adrenal mass  HPI:  [name] [name] reports that her husband is doing well.  She states that he does not check his glucose.  He has an appointment with his PCP on Tuesday.  Objective: Lab Results  Component Value Date   CREATININE 1.01 01/29/2018   CREATININE 1.13 01/25/2018   CREATININE 0.87 01/24/2018    Lab Results  Component Value Date   HGBA1C 6.1 (H) 11/25/2017    Lipid Panel     Component Value Date/Time   CHOL 116 05/13/2017 1105   TRIG 115 05/13/2017 1105   TRIG 87 07/17/2008   HDL 45 05/13/2017 1105   CHOLHDL 2.6 05/13/2017 1105   LDLCALC 48 05/13/2017 1105   LDLDIRECT 65 01/16/2016 1502    BP Readings from Last 3 Encounters:  01/29/18 (!) 114/49  01/22/18 126/76  12/17/17 126/69    No Known Allergies  Medications Reviewed Today    Reviewed by Dionne Milo, [place] (Pharmacist) on 01/31/18 at 1425  Med List Status: <None>  Medication Order Taking? Sig Documenting Provider Last Dose Status Informant  albuterol (PROVENTIL HFA;VENTOLIN HFA) 108 (90 Base) MCG/ACT inhaler [id number] No Inhale 2 puffs into the lungs every 6 (six) hours as needed for wheezing or shortness of breath.  Patient not taking:  Reported on 01/31/2018   Eustaquio Maize, MD Not Taking Active Self  atorvastatin (LIPITOR) 10 MG tablet [id number] Yes Take 1 tablet (10 mg total) by mouth daily. Chipper Herb, MD Taking Active Self  dexamethasone (DECADRON) 4 MG tablet [id number] Yes Take 1 tablet (4 mg total) by mouth daily. Shelly Coss, MD Taking Active   flecainide (TAMBOCOR) 100 MG  tablet [id number] Yes TAKE 1 TABLET (100 MG TOTAL) BY MOUTH 2 (TWO) TIMES DAILY.  Patient taking differently:  Take 100 mg by mouth 2 (two) times daily.    Timmothy Euler, MD Taking Active Self  levothyroxine (SYNTHROID, LEVOTHROID) 50 MCG tablet [id number] Yes TAKE 2 TABLETS BY MOUTH DAILY  Patient taking differently:  Take 100 mcg by mouth daily before breakfast.    Timmothy Euler, MD Taking Active Self  metFORMIN (GLUCOPHAGE-XR) 500 MG 24 hr tablet [id number] Yes Take 1 tablet (500 mg total) by mouth daily with breakfast. (Need to be seen) Dettinger, [name], MD Taking Active Self          ASSESSMENT: Date Discharged from Hospital: 01/29/18 Date Medication Reconciliation Performed: 01/31/2018  Medications Discontinued at Discharge:   Diltiazem  lisinopril  Patient was recently discharged from hospital and all medications have been reviewed.  Drugs sorted by system:  Neurologic/Psychologic:dexamethasone  Cardiovascular:atorvastatin, flecainide  Pulmonary/Allergy: albuterol MDI  Endocrine:levothyroxine, metformin  Medication Review Findings:  . Atrial fibrillation is rate controlled.  Not on anticoagulation.  PLAN: -Instructed patient to take new medications as prescribed and discontinue old medications as prescribed   Route note to PCP, Dr. Warrick Parisian.   Joetta Manners, PharmD Clinical Pharmacist [place] [phone]

## 2018-02-03 ENCOUNTER — Other Ambulatory Visit: Payer: PPO

## 2018-02-03 ENCOUNTER — Other Ambulatory Visit: Payer: Self-pay | Admitting: *Deleted

## 2018-02-03 DIAGNOSIS — C7931 Secondary malignant neoplasm of brain: Secondary | ICD-10-CM

## 2018-02-04 ENCOUNTER — Other Ambulatory Visit: Payer: Self-pay | Admitting: *Deleted

## 2018-02-04 ENCOUNTER — Encounter: Payer: Self-pay | Admitting: Family Medicine

## 2018-02-04 ENCOUNTER — Ambulatory Visit (INDEPENDENT_AMBULATORY_CARE_PROVIDER_SITE_OTHER): Payer: PPO | Admitting: Family Medicine

## 2018-02-04 VITALS — BP 135/75 | HR 69 | Temp 97.4°F | Ht 69.0 in | Wt 270.4 lb

## 2018-02-04 DIAGNOSIS — C439 Malignant melanoma of skin, unspecified: Secondary | ICD-10-CM

## 2018-02-04 DIAGNOSIS — R062 Wheezing: Secondary | ICD-10-CM

## 2018-02-04 DIAGNOSIS — S065X9A Traumatic subdural hemorrhage with loss of consciousness of unspecified duration, initial encounter: Secondary | ICD-10-CM | POA: Diagnosis not present

## 2018-02-04 DIAGNOSIS — C799 Secondary malignant neoplasm of unspecified site: Secondary | ICD-10-CM

## 2018-02-04 DIAGNOSIS — S065XAA Traumatic subdural hemorrhage with loss of consciousness status unknown, initial encounter: Secondary | ICD-10-CM

## 2018-02-04 MED ORDER — ALBUTEROL SULFATE HFA 108 (90 BASE) MCG/ACT IN AERS: 2.0000 | INHALATION_SPRAY | Freq: Four times a day (QID) | RESPIRATORY_TRACT | 2 refills | Status: AC | PRN

## 2018-02-04 NOTE — Progress Notes (Signed)
BP 135/75   Pulse 69   Temp (!) 97.4 F (36.3 C) (Oral)   Ht 5\' 9"  (1.753 m)   Wt 270 lb 6.4 oz (122.7 kg)   BMI 39.93 kg/m    Subjective:    Patient ID: [name] [name], male    DOB: 09/04/1940, 77 y.o.   MRN: [id number]  HPI: [name] [name] is a 77 y.o. male presenting on 02/04/2018 for Hospitalization Follow-up ([facility]- Patient states he has no complaints today )   HPI Hospital follow-up for subdural and metastatic melanoma Patient is coming in today for hospital follow-up for subdural and intracranial hemorrhage which is been his third time in a year that he has had this and then he had another MRI which showed this time multiple brain tumors and then he had an MRI of the abdomen which showed a few abdominal tumors and they biopsied an adrenal tumor which showed metastatic melanoma.  Patient is currently on Decadron and his symptoms of the headache and confusion and visual troubles have all subsided and he is feeling great right now.  Patient denies any headaches or blurred vision or focal numbness or weakness today.  Relevant past medical, surgical, family and social history reviewed and updated as indicated. Interim medical history since our last visit reviewed. Allergies and medications reviewed and updated.  Review of Systems  Constitutional: Negative for chills and fever.  Eyes: Negative for photophobia and visual disturbance.  Respiratory: Negative for shortness of breath and wheezing.   Cardiovascular: Negative for chest pain and leg swelling.  Musculoskeletal: Negative for back pain and gait problem.  Skin: Negative for rash.  Neurological: Negative for dizziness, weakness, light-headedness, numbness and headaches.  All other systems reviewed and are negative.   Per HPI unless specifically indicated above   Allergies as of 02/04/2018   No Known Allergies     Medication List        Accurate as of 02/04/18 10:40 AM. Always use your most recent med list.          albuterol 108 (90 Base) MCG/ACT inhaler Commonly known as:  PROVENTIL HFA;VENTOLIN HFA Inhale 2 puffs into the lungs every 6 (six) hours as needed for wheezing or shortness of breath.   atorvastatin 10 MG tablet Commonly known as:  LIPITOR Take 1 tablet (10 mg total) by mouth daily.   dexamethasone 4 MG tablet Commonly known as:  DECADRON Take 1 tablet (4 mg total) by mouth daily.   flecainide 100 MG tablet Commonly known as:  TAMBOCOR TAKE 1 TABLET (100 MG TOTAL) BY MOUTH 2 (TWO) TIMES DAILY.   levothyroxine 50 MCG tablet Commonly known as:  SYNTHROID, LEVOTHROID TAKE 2 TABLETS BY MOUTH DAILY   metFORMIN 500 MG 24 hr tablet Commonly known as:  GLUCOPHAGE-XR Take 1 tablet (500 mg total) by mouth daily with breakfast. (Need to be seen)          Objective:    BP 135/75   Pulse 69   Temp (!) 97.4 F (36.3 C) (Oral)   Ht 5\' 9"  (1.753 m)   Wt 270 lb 6.4 oz (122.7 kg)   BMI 39.93 kg/m   Wt Readings from Last 3 Encounters:  02/04/18 270 lb 6.4 oz (122.7 kg)  01/22/18 275 lb (124.7 kg)  01/22/18 275 lb (124.7 kg)    Physical Exam  Constitutional: He is oriented to person, place, and time. He appears well-developed and well-nourished. No distress.  Eyes: Pupils are equal, round,  and reactive to light. Conjunctivae and EOM are normal. No scleral icterus.  Neck: Neck supple. No thyromegaly present.  Cardiovascular: Normal rate, regular rhythm, normal heart sounds and intact distal pulses.  No murmur heard. Pulmonary/Chest: Effort normal and breath sounds normal. No respiratory distress. He has no wheezes.  Lymphadenopathy:    He has no cervical adenopathy.  Neurological: He is alert and oriented to person, place, and time. Coordination normal.  Skin: Skin is warm and dry. No rash noted. He is not diaphoretic.  Psychiatric: He has a normal mood and affect. His behavior is normal.  Nursing note and vitals reviewed.       Assessment & Plan:   Problem List Items  Addressed This Visit      Nervous and Auditory   Subdural hematoma (HCC)    Other Visit Diagnoses    Metastatic melanoma ([place])    -  Primary   Wheezing       Relevant Medications   albuterol (PROVENTIL HFA;VENTOLIN HFA) 108 (90 Base) MCG/ACT inhaler       Follow up plan: Return if symptoms worsen or fail to improve.  Counseling provided for all of the vaccine components No orders of the defined types were placed in this encounter.   Caryl Pina, MD [facility] 02/04/2018, 10:40 AM

## 2018-02-04 NOTE — Progress Notes (Signed)
Has armband been applied?  Yes  Does patient have an allergy to IV contrast dye?: No   Has patient ever received premedication for IV contrast dye?: N/A  Does patient take metformin?: Yes  If patient does take metformin when was the last dose: 02/10/18 lab scheduled for 02/13/18, BUN, CR. Will call patient to tell him to continue metformin.   Date of lab work: 01/29/18 BUN: 23 CR: 1.01 EGFR: >60  IV site: Left forearm  Has IV site been added to flowsheet?  Yes.

## 2018-02-04 NOTE — Progress Notes (Signed)
Location/Histology of Brain Tumor:  01/15/18 MRI Brain Brain: A hemorrhagic right frontal mass demonstrates solid enhancement and measures 3.2 x 2.7 cm (series 15, image 130). A cystic component or separate cystic lesion/cystic encephalomalacia inferior to the solid mass measures 4.2 x 2.6 cm and demonstrates a small amount of peripheral linear and nodular enhancement (series 15, image 116). A 1.7 cm hemorrhagic right frontal lobe mass anterior to the insula has enlarged from the prior MRI and now clearly enhances (series 15, image 90).  A 1.2 cm enhancing mass in the anteromedial left frontal lobe is new or larger compared to the prior MRI (series 15, image 89). A 2.6 x 2.6 cm hemorrhagic lateral left frontal lobe mass has enlarged from the prior MRI and now also clearly has solid enhancing components. A 6 mm ring-enhancing inferior right frontal lesion is new (series 15, image 79). Motion artifact could obscure additional small enhancing lesions. There is moderate vasogenic edema associated with the lateral left frontal mass with milder edema associated with other bilateral frontal masses.   Patient presented with symptoms of:  He has a recent history of headaches and intracranial hemorrhages and imaging was performed.   Past or anticipated interventions, if any, per neurosurgery:  None  Past or anticipated interventions, if any, per medical oncology:  Dr. Mickeal Skinner saw as an inpatient consult on 01/27/18 ASSESSMENT & PLAN:  Brain Tumors Mr. Dibari presents with clinical picture c/w metastatic brain lesions.  Hemorrhagic nature raises suspicion for melanoma.  His case was discussed in Brain and Spine Tumor Board this morning. In review involving radiation oncology, neurosurgery, neuropathology, and neuroradiology, recommendation was made for adrenal biopsy over brain biopsy.  Aside from higher surgical risk, extensive blood products would limit yield of pathology specimen. Can  decreased decadron to 4mg  daily. Will continue to follow once tissue diagnosis is obtained  Dose of Decadron, if applicable: 4 mg daily  Recent neurologic symptoms, if any:   Seizures: No  Headaches: Yes, improved with decadron.   Nausea: No  Dizziness/ataxia: No  Difficulty with hand coordination: No  Focal numbness/weakness: No  Visual deficits/changes: No  Confusion/Memory deficits: He reports short term memory difficulties.   Painful bone metastases at present, if any: No  SAFETY ISSUES:  Prior radiation? No  Pacemaker/ICD? No  Possible current pregnancy? No  Is the patient on methotrexate? No  Additional Complaints / other details:  01/29/18 Diagnosis Soft Tissue Needle Core Biopsy, Left - METASTATIC MELANOMA. SEE NOTE.  02/10/18 MRI brain  BP 117/81   Pulse 69   Temp 98.1 F (36.7 C) (Oral)   Ht 5\' 9"  (1.753 m)   Wt 264 lb 9.6 oz (120 kg)   SpO2 97%   BMI 39.07 kg/m    Wt Readings from Last 3 Encounters:  02/11/18 264 lb 9.6 oz (120 kg)  02/04/18 270 lb 6.4 oz (122.7 kg)  01/22/18 275 lb (124.7 kg)

## 2018-02-05 ENCOUNTER — Telehealth: Payer: Self-pay | Admitting: *Deleted

## 2018-02-05 NOTE — Telephone Encounter (Signed)
Contacted [facility]. Gave MD request for tests: PDL1 and BRAF to [facility] Suanne Marker states she will let [facility] know since biopsy was completed there.

## 2018-02-07 ENCOUNTER — Encounter: Payer: Self-pay | Admitting: Hematology

## 2018-02-07 ENCOUNTER — Other Ambulatory Visit: Payer: Self-pay | Admitting: *Deleted

## 2018-02-07 ENCOUNTER — Telehealth: Payer: Self-pay | Admitting: Hematology

## 2018-02-07 MED ORDER — FLECAINIDE ACETATE 100 MG PO TABS: ORAL_TABLET | ORAL | 1 refills | Status: AC

## 2018-02-07 NOTE — Telephone Encounter (Signed)
Pt has been scheduled to see Dr. Irene Limbo on 12/18 at 1pm. I cld and left the appt information on the pt's vm. Letter mailed.

## 2018-02-07 NOTE — Progress Notes (Signed)
Brain and Spine Tumor Board Documentation  [name] [name] was presented by Cecil Cobbs, MD at Brain and Spine Tumor Board on 02/07/2018, which included representatives from neuro oncology, radiation oncology, surgical oncology, navigation, pathology, radiology.  [name] [name] was presented as a new patient with history of the following treatments:  .  Additionally, we reviewed previous medical and familial history, history of present illness, and recent lab results along with all available histopathologic and imaging studies. The tumor board considered available treatment options and made the following recommendations:  Additional screening needs 3T MRI, med-onc referral, likely [place]  Tumor board is a meeting of clinicians from various specialty areas who evaluate and discuss patients for whom a multidisciplinary approach is being considered. Final determinations in the plan of care are those of the provider(s). The responsibility for follow up of recommendations given during tumor board is that of the provider.   Today's extended care, comprehensive team conference, [name] [name] was not present for the discussion and was not examined.

## 2018-02-10 ENCOUNTER — Ambulatory Visit (HOSPITAL_COMMUNITY)
Admission: RE | Admit: 2018-02-10 | Discharge: 2018-02-10 | Disposition: A | Discharge status: Home / Self Care | Payer: PPO | Source: Ambulatory Visit | Attending: Radiation Oncology | Admitting: Radiation Oncology

## 2018-02-10 DIAGNOSIS — I629 Nontraumatic intracranial hemorrhage, unspecified: Secondary | ICD-10-CM | POA: Insufficient documentation

## 2018-02-10 DIAGNOSIS — I615 Nontraumatic intracerebral hemorrhage, intraventricular: Secondary | ICD-10-CM | POA: Insufficient documentation

## 2018-02-10 DIAGNOSIS — C7951 Secondary malignant neoplasm of bone: Secondary | ICD-10-CM | POA: Diagnosis not present

## 2018-02-10 DIAGNOSIS — J634 Siderosis: Secondary | ICD-10-CM | POA: Insufficient documentation

## 2018-02-10 DIAGNOSIS — C801 Malignant (primary) neoplasm, unspecified: Secondary | ICD-10-CM | POA: Diagnosis not present

## 2018-02-10 DIAGNOSIS — C7931 Secondary malignant neoplasm of brain: Secondary | ICD-10-CM

## 2018-02-10 MED ORDER — GADOBUTROL 1 MMOL/ML IV SOLN
10.0000 mL | Freq: Once | INTRAVENOUS | Status: AC | PRN
  Administered 2018-02-10: 10 mL via INTRAVENOUS

## 2018-02-10 NOTE — Progress Notes (Signed)
I called and left a voice mail with [name] [name] on his home phone number. I informed him not to take his metformin in the morning (02/11/18) because he will receive IV constrast during his simulation that day. I left my direct number to call me back if he had any further questions or concerns.

## 2018-02-11 ENCOUNTER — Other Ambulatory Visit: Payer: Self-pay

## 2018-02-11 ENCOUNTER — Encounter: Payer: Self-pay | Admitting: Radiation Oncology

## 2018-02-11 ENCOUNTER — Ambulatory Visit
Admission: RE | Admit: 2018-02-11 | Discharge: 2018-02-11 | Disposition: A | Discharge status: Home / Self Care | Payer: PPO | Source: Ambulatory Visit | Attending: Radiation Oncology | Admitting: Radiation Oncology

## 2018-02-11 ENCOUNTER — Ambulatory Visit: Payer: Self-pay | Admitting: Medical Oncology

## 2018-02-11 ENCOUNTER — Ambulatory Visit: Payer: PPO | Admitting: Radiation Oncology

## 2018-02-11 DIAGNOSIS — Z510 Encounter for antineoplastic radiation therapy: Secondary | ICD-10-CM | POA: Insufficient documentation

## 2018-02-11 DIAGNOSIS — C439 Malignant melanoma of skin, unspecified: Secondary | ICD-10-CM | POA: Diagnosis not present

## 2018-02-11 DIAGNOSIS — C7931 Secondary malignant neoplasm of brain: Secondary | ICD-10-CM

## 2018-02-11 DIAGNOSIS — I629 Nontraumatic intracranial hemorrhage, unspecified: Secondary | ICD-10-CM | POA: Diagnosis not present

## 2018-02-11 DIAGNOSIS — I10 Essential (primary) hypertension: Secondary | ICD-10-CM | POA: Diagnosis not present

## 2018-02-11 DIAGNOSIS — Z85820 Personal history of malignant melanoma of skin: Secondary | ICD-10-CM | POA: Diagnosis not present

## 2018-02-11 DIAGNOSIS — E039 Hypothyroidism, unspecified: Secondary | ICD-10-CM | POA: Insufficient documentation

## 2018-02-11 DIAGNOSIS — Z79899 Other long term (current) drug therapy: Secondary | ICD-10-CM | POA: Diagnosis not present

## 2018-02-11 MED ORDER — SODIUM CHLORIDE 0.9% FLUSH
10.0000 mL | Freq: Once | INTRAVENOUS | Status: AC
  Administered 2018-02-11: 10 mL via INTRAVENOUS

## 2018-02-11 NOTE — Progress Notes (Signed)
HEMATOLOGY/ONCOLOGY CONSULTATION NOTE  Date of Service: 02/12/2018  Patient Care Team: Dettinger, [name], MD as PCP - General (Family Medicine) Rutherford Guys, MD as Consulting Physician (Ophthalmology) Stanford Breed Denice Bors, MD as Consulting Physician (Cardiology) Rogene Houston, MD as Consulting Physician (Gastroenterology) Gaynelle Arabian, MD as Consulting Physician (Orthopedic Surgery) Celene Squibb, MD as Consulting Physician (Dermatology) Dettinger, [name], MD as Consulting Physician (Family Medicine) Florance, Tomasa Blase, RN as [facility]  CHIEF COMPLAINTS/PURPOSE OF CONSULTATION:  Newly Diagnosed Metastatic Melanoma  HISTORY OF PRESENTING ILLNESS:  [name] [name] is a  77 y.o. male who has been referred to [place] by Dr .Shelly Coss, MD  for evaluation and management of newly noted multiple brain metastases.  Patient has a history of hypertension, dyslipidemia, hypothyroidism, prediabetes, atrial fibrillation [currently off anticoagulation due to intracranial bleeding). 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 Marland Kitchen Ditty who was in the hospital in late September 2019 with multifocal intracerebral hemorrhages with a significant bleed in the right frontal lobe and altered mental status.  Warfarin was reversed with vitamin K and Kcentra.  Patient improved and was discharged home on 11/29/2017.   Patient was again in the hospital ED on 12/02/2017 with confusion.  He has been following with his primary care physician Dr. Vonna Kotyk Dettinger for continued follow-up of these intraparenchymal hemorrhages.  Patient presented on 11/28 with 2-week history of worsening headaches and CT scan showed enlargement of prior areas of hemorrhage.  MRI of the brain done on 01/23/2018 showed - Multiple brain masses, many of which are hemorrhagic and now clearly demonstrate enhancement. These are consistent with  metastases.  Up to moderate associated vasogenic edema. No midline shift. Neurology and neurosurgery are following.  CT of the chest abdomen pelvis was done to evaluate for possible primary tumor on 01/24/2018 and showed No definite primary neoplasm. Several nonspecific findings are noted within the chest in abdomen including a 5 cm left adrenal mass. This may represent a benign or malignant tumor.Metastatic disease not excluded. More definitive characterization.  Patient received some dexamethasone and notes that his headaches have been resolved at this time.  We were consulted for medical oncology input to determine further management. Patient reports no acute new focal symptoms at this time. Wife notes that he seems to be at his recent baseline.  She notes that he has had some balance issues but no other acute new neurological symptoms.  Patient reports having multiple skin lesions that were determined to be precancerous removed in the past.  He follows with Dr. Allyn Kenner in [place] for his dermatology cares but notes that it has been more than a year since last follow-up.  Endorses no other focal symptomatology suggestive of a primary lesion. Does not report any specific new skin lesion that is obviously enlarging. No change in bowel habits.  No new urinary symptoms. No cough or acute shortness of breath.  Interval History:   [name] [name] returns today for management and evaluation of his newly diagnosed metastatic melanoma. I last saw the patient on 01/25/18 as an inpatient. He is accompanied today by three members of his family. The pt reports that he is doing well overall.   The pt will be beginning [place] with Dr. Eppie Gibson in [place] on 02/14/18.   The pt reports that none of his skin lesions have changed recently. He last saw Dr. [place] Crane more than 6 months ago.  The pt notes that he is eating well and is sleeping well. He "still has a headache every once in a while, but  it's not that bad," and takes a Tylenol when needed.  The pt notes that in the last few days he has had some difficulty controlling his bladder and bowels. He notes that he senses the need to urinate, but urinates before he makes it to the bathroom. He notes that he has some burning when he urinates. The pt notes that he can control his bowels,  "but sometimes it gets a little runny before I can make it to the bathroom."   The pt notes that his DM has been well controlled. He is now taking 19m Dexamethasone while he is pursuing radiation.   The pt is taking his thyroid pills regularly.   Of note since the patient's last visit, pt has had an MRI Brain completed on 02/11/18 with results revealing Total of 6 melanoma metastases in the brain (if adjacent right superior frontal lobe cystic and solid masses are treated as two). No new lesions since November (see #2). All metastases are annotated on series 12. Most demonstrate mild enlargement since November, with the range of 7 to 38 mm diameter. Associated vasogenic edema has not significantly changed. No significant intracranial mass effect at this time. 2. Probable artifact in the superior right parietal lobe on series 12, image 147. Attention directed on follow-up. 3. 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  Lab results (12/29/17) of CBC is as follows: all values are WNL.  On review of systems, pt reports eating well, sleeping well, mild intermittent headache, burning urination, some bladder incontinence, some bowel incontinence, and denies noticing any new skin spots, changes in vision, mouth sores, new bone pains, new back pains, abdominal pains, flank pain, leg swelling, and any other symptoms.    MEDICAL HISTORY:  Past Medical History:  Diagnosis Date  . Arthritis   . Atrial fibrillation ([facility]    previously on Coumadin, reversed with head bleed  . Cataract   .  Diabetes mellitus without complication ([facility]   . Hyperlipidemia   . HYPERTENSION   . HYPOTHYROIDISM   . 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 Pre-diabetes   . Precancerous skin lesion    on back  . ROTATOR CUFF TEAR   . Subdural hematoma ([facility])     SURGICAL HISTORY: Past Surgical History:  Procedure Laterality Date  . APPENDECTOMY    . CATARACT EXTRACTION W/PHACO Right 02/02/2014   Procedure: CATARACT EXTRACTION PHACO AND INTRAOCULAR LENS PLACEMENT (IOC);  Surgeon: MElta GuadeloupeT. SGershon Crane MD;  Location: [facility];  Service: Ophthalmology;  Laterality: Right;  CDE 12.17  . CATARACT EXTRACTION W/PHACO Left 02/16/2014   Procedure: CATARACT EXTRACTION PHACO AND INTRAOCULAR LENS PLACEMENT ;  Surgeon: MElta GuadeloupeT. SGershon Crane MD;  Location: [facility];  Service: Ophthalmology;  Laterality: Left;  CDE:13.42  . CRANIOTOMY N/A 02/05/2017   Procedure: Bilateral Burr Holes . Craniotomy for Subdural;  Surgeon: Ditty, BKevan Ny MD;  [facility]  Service: Neurosurgery;  Laterality: N/A;  . EYE SURGERY  2015   cataract surgery. bilateral  . JOINT REPLACEMENT  2009   bilateral knee replacement  . KNEE SURGERY    . ROTATOR CUFF REPAIR Left   . SHOULDER SURGERY    . TOTAL KNEE ARTHROPLASTY Bilateral     SOCIAL HISTORY: Social History   Socioeconomic History  .  Marital status: Married    Spouse name: Not on file  . Number of children: 2  . Years of education: Not on file  . Highest education level: Not on file  Occupational History  . Not on file  Social Needs  . Financial resource strain: Not hard at all  . Food insecurity:    Worry: Never true    Inability: Never true  . Transportation needs:    Medical: No    Non-medical: No  Tobacco Use  . Smoking status: Never Smoker  . Smokeless tobacco: Never Used  Substance and Sexual Activity  . Alcohol use: No    Frequency: Never    Comment: remote use, h/o heavy binge use about 25 years ago  . Drug use: No    Comment:  remote h/o marijuana use  . Sexual activity: Never    Birth control/protection: None  Lifestyle  . Physical activity:    Days per week: 0 days    Minutes per session: 0 min  . Stress: Not at all  Relationships  . Social connections:    Talks on phone: More than three times a week    Gets together: More than three times a week    Attends religious service: More than 4 times per year    Active member of club or organization: Yes    Attends meetings of clubs or organizations: More than 4 times per year    Relationship status: Married  . Intimate partner violence:    Fear of current or ex partner: Not on file    Emotionally abused: Not on file    Physically abused: Not on file    Forced sexual activity: Not on file  Other Topics Concern  . Not on file  Social History Narrative        FAMILY HISTORY: Family History  Problem Relation Age of Onset  . Heart attack Brother   . Early death Brother   . Asthma Father   . Vision loss Father   . Hypertension Mother   . Heart attack Brother 3  . Arthritis Brother     ALLERGIES:  has No Known Allergies.  MEDICATIONS:  Current Outpatient Medications  Medication Sig Dispense Refill  . albuterol (PROVENTIL HFA;VENTOLIN HFA) 108 (90 Base) MCG/ACT inhaler Inhale 2 puffs into the lungs every 6 (six) hours as needed for wheezing or shortness of breath. 1 Inhaler 2  . atorvastatin (LIPITOR) 10 MG tablet Take 1 tablet (10 mg total) by mouth daily. 90 tablet 0  . dexamethasone (DECADRON) 4 MG tablet Take 1 tablet (4 mg total) by mouth daily. 30 tablet 0  . flecainide (TAMBOCOR) 100 MG tablet TAKE 1 TABLET (100 MG TOTAL) BY MOUTH 2 (TWO) TIMES DAILY. 180 tablet 1  . levothyroxine (SYNTHROID, LEVOTHROID) 50 MCG tablet TAKE 2 TABLETS BY MOUTH DAILY (Patient taking differently: Take 100 mcg by mouth daily before breakfast. ) 180 tablet 2  . metFORMIN (GLUCOPHAGE-XR) 500 MG 24 hr tablet Take 1 tablet (500 mg total) by mouth daily with breakfast.  (Need to be seen) 90 tablet 0   No current facility-administered medications for this visit.     REVIEW OF SYSTEMS:    A 10+ POINT REVIEW OF SYSTEMS WAS OBTAINED including neurology, dermatology, psychiatry, cardiac, respiratory, lymph, extremities, GI, GU, Musculoskeletal, constitutional, breasts, reproductive, HEENT.  All pertinent positives are noted in the HPI.  All others are negative.   PHYSICAL EXAMINATION: ECOG PERFORMANCE STATUS: 2 - Symptomatic, <50% confined  to bed  . Vitals:   02/12/18 1300  BP: (!) 147/80  Pulse: 76  Resp: 18  Temp: 97.8 F (36.6 C)  SpO2: 99%   Filed Weights   02/12/18 1300  Weight: 264 lb 12.8 oz (120.1 kg)   .Body mass index is 39.1 kg/m.  GENERAL:alert, in no acute distress and comfortable SKIN: no acute rashes, extensive hyperpigmented and raised skin lesion and evidence of chronic skin sun injury  EYES: conjunctiva are pink and non-injected, sclera anicteric OROPHARYNX: MMM, no exudates, no oropharyngeal erythema or ulceration NECK: supple, no JVD LYMPH:  no palpable lymphadenopathy in the cervical, axillary or inguinal regions LUNGS: clear to auscultation b/l with normal respiratory effort HEART: regular rate & rhythm ABDOMEN:  normoactive bowel sounds , non tender, not distended. Extremity: trace pedal edema b/l PSYCH: alert & oriented x 3 with fluent speech NEURO: no focal motor/sensory deficits  LABORATORY DATA:  I have reviewed the data as listed  . CBC Latest Ref Rng & Units 01/29/2018 01/25/2018 01/24/2018  WBC 4.0 - 10.5 K/uL 10.9(H) 6.0 9.1  Hemoglobin 13.0 - 17.0 g/dL 14.8 15.7 15.2  Hematocrit 39.0 - 52.0 % 45.1 48.5 47.1  Platelets 150 - 400 K/uL 272 284 262    . CMP Latest Ref Rng & Units 01/29/2018 01/25/2018 01/24/2018  Glucose 70 - 99 mg/dL 133(H) 183(H) 104(H)  BUN 8 - 23 mg/dL 23 12 12   Creatinine 0.61 - 1.24 mg/dL 1.01 1.13 0.87  Sodium 135 - 145 mmol/L 134(L) 133(L) 134(L)  Potassium 3.5 - 5.1 mmol/L 4.3  4.1 3.9  Chloride 98 - 111 mmol/L 102 100 101  CO2 22 - 32 mmol/L 23 20(L) 23  Calcium 8.9 - 10.3 mg/dL 8.5(L) 9.4 9.1  Total Protein 6.5 - 8.1 g/dL - - -  Total Bilirubin 0.3 - 1.2 mg/dL - - -  Alkaline Phos 38 - 126 U/L - - -  AST 15 - 41 U/L - - -  ALT 0 - 44 U/L - - -   01/29/18 Soft Tissue Biopsy:    RADIOGRAPHIC STUDIES: I have personally reviewed the radiological images as listed and agreed with the findings in the report. Ct Head Wo Contrast  Result Date: 01/24/2018 CLINICAL DATA:  Follow-up examination for acute stroke. EXAM: CT HEAD WITHOUT CONTRAST TECHNIQUE: Contiguous axial images were obtained from the base of the skull through the vertex without intravenous contrast. COMPARISON:  Previous CT from 01/22/2018 as well as brain MRI from 01/23/2018. FINDINGS: Brain: Multiple hemorrhagic brain masses again seen, relatively stable and unchanged in size as compared to most recent CT. Largest right frontal lobe mass measures 3.6 x 2.4 cm. Dominant left frontal lesion measures 2.7 x 2.8 cm. Mass at the left gyrus rectus measures 1.4 x 1.2 cm. Additional right frontal operculum lower lesion measures 1.5 x 1.5 cm. Surrounding vasogenic edema about several of these lesions, most notable at the left frontal lesion. No midline shift. Basilar cisterns remain patent. Cystic encephalomalacia at the adjacent right frontal lesion with is small amount of layering hemorrhage again noted. Intraventricular extension with small amount of blood seen layering within the occipital horns, also unchanged. No other new intracranial hemorrhage or mass lesion identified. No acute large vessel territory infarct. No extra-axial fluid collection. Atrophy with chronic microvascular ischemic disease again noted. Vascular: No hyperdense vessel. Scattered vascular calcifications noted within the carotid siphons. Skull: Scalp soft tissues demonstrate no acute finding. Sequelae of prior bilateral burr hole craniotomy.  Sinuses/Orbits: Globes orbital soft  tissues within normal limits. Patient status post bilateral ocular lens replacement. Paranasal sinuses remain largely clear. Small bilateral maxillary sinus retention cyst noted. Small bilateral mastoid effusions noted. Other: None. IMPRESSION: 1. Relatively stable appearance of multifocal hemorrhagic masses with localized edema as above. No midline shift. 2. Associated small volume intraventricular hemorrhage, relatively unchanged. Stable ventricular size without hydrocephalus. 3. No other new acute intracranial abnormality. Electronically Signed   By: Jeannine Boga M.D.   On: 01/24/2018 02:17   Ct Head Wo Contrast  Result Date: 01/23/2018 CLINICAL DATA:  Initial evaluation for acute headache, recent intracranial hemorrhages. EXAM: CT HEAD WITHOUT CONTRAST TECHNIQUE: Contiguous axial images were obtained from the base of the skull through the vertex without intravenous contrast. COMPARISON:  Prior CT from 12/17/2017. FINDINGS: Brain: Since the previous exam, the right frontal intraparenchymal hemorrhage has enlarged in size now measuring 3.5 x 2.4 x 1.9 cm (series 2, image 25). Localized edema within this region overall slightly improved. Adjacent cystic encephalomalacia of now contains a small amount of layering hemorrhage (series 2, image 22). Additional left frontal intraparenchymal hematoma also increased in size now measuring 3.1 x 2.5 x 2.5 cm (series 2, image 22, previously 1.8 cm). Localized vasogenic edema slightly worsened from previous. Additional right frontotemporal hemorrhage also increased in size now measuring 1.5 x 1.7 x 1.1 cm (series 2, image 17). Localized vasogenic edema has worsened without significant regional mass effect. Additional soft tissue density at the parasagittal aspect of the anterior inferior left frontal lobe measures 1.5 x 1.2 x 1.3 cm with mild localized edema without midline shift. This may have been present on prior CT, although  difficult to see on prior exam. Finding also likely reflects a small intraparenchymal hemorrhage. New small volume intraventricular hemorrhage with blood seen layering within the occipital horns of both lateral ventricles, likely via extension from the right frontal ventricular size is relatively stable without interval hydrocephalus. Basilar cisterns remain patent. No other acute intracranial hemorrhage. No subarachnoid or extra-axial hemorrhage appreciated. No acute large vessel territory infarct. Underlying cerebral atrophy, stable. Vascular: No hyperdense vessel. Scattered vascular calcifications noted within the carotid siphons. Skull: Scalp soft tissues demonstrate no acute abnormality. Sequelae of prior bilateral burr hole craniotomy. Sinuses/Orbits: Globes and orbital soft tissues within normal limits. Small bilateral maxillary sinus retention cyst noted. Paranasal sinuses are otherwise clear. Small chronic right mastoid effusion noted. Other: None. IMPRESSION: 1. Interval increase in size of bilateral intraparenchymal hematomas as above. Localized vasogenic edema without significant midline shift. 2. New small volume intraventricular hemorrhage, likely via extension from the right frontal hematoma and adjacent cystic encephalomalacia. Stable ventricular size without hydrocephalus or ventricular trapping at this time. Critical Value/emergent results were called by telephone at the time of interpretation on 01/23/2018 at 12:13 am to Dr. Rolland Porter , who verbally acknowledged these results. Electronically Signed   By: Jeannine Boga M.D.   On: 01/23/2018 00:18   Ct Chest W Contrast  Result Date: 01/24/2018 CLINICAL DATA:  Evaluate for metastatic disease. Initial cancer workup. EXAM: CT CHEST, ABDOMEN, AND PELVIS WITH CONTRAST TECHNIQUE: Multidetector CT imaging of the chest, abdomen and pelvis was performed following the standard protocol during bolus administration of intravenous contrast. CONTRAST:   142m OMNIPAQUE IOHEXOL 300 MG/ML  SOLN COMPARISON:  None. FINDINGS: CT CHEST FINDINGS Cardiovascular: Heart size appears within normal limits. No pericardial effusion identified. Aortic atherosclerosis. Calcifications within the LAD and RCA coronary arteries noted. Mediastinum/Nodes: Normal appearance of the thyroid gland. The trachea appears patent and is  midline. No axillary or supraclavicular adenopathy. Within the left posterior mediastinum there is a lymph node measuring 1.4 cm, image 37/3. Lungs/Pleura: No pleural effusion, airspace consolidation or atelectasis. Pulmonary nodule within the posterior costophrenic sulcus measures 9 mm, image 124/5. Also within the posterior left costophrenic sulcus is a 8 mm lung nodule, image 115/5. Musculoskeletal: No chest wall mass or suspicious bone lesions identified. CT ABDOMEN PELVIS FINDINGS Hepatobiliary: No focal liver abnormality is seen. No gallstones, gallbladder wall thickening, or biliary dilatation. Pancreas: Cystic lesion arising from the tail of pancreas measures 1.9 by 2.2 cm, image 52/3. No inflammation or main duct dilatation. Spleen: Spleen is normal. Adrenals/Urinary Tract: Normal appearance of the right adrenal gland. Left adrenal nodule measures 4.4 by 3.3 by 5.0 cm and measures 46 HU. Normal appearance of the right kidney. Multiple exophytic cysts are noted arising from the upper and lower pole of left kidney. Indeterminate exophytic lesion arising from the anterior cortex of the interpolar left kidney measures 1 cm and 49 HU, image 64/3. No hydronephrosis identified bilaterally. No focal bladder lesion identified. Stomach/Bowel: Stomach appears normal. The small bowel loops have a normal course and caliber. No pathologic dilatation of the colon. Distal colonic diverticula noted without acute inflammation. Vascular/Lymphatic: Aortic atherosclerosis. No aneurysm. No abdominopelvic adenopathy. Reproductive: Prostate is unremarkable. Other: No abdominal  wall hernia or abnormality. No abdominopelvic ascites. Musculoskeletal: Spondylosis noted within the lumbar spine. No aggressive lytic or sclerotic bone lesion. IMPRESSION: 1. No definite primary neoplasm identified. Several nonspecific findings are noted within the chest in abdomen including a 5 cm left adrenal mass. This may represent a benign or malignant tumor. Metastatic disease not excluded. More definitive characterization could be obtained with contrast enhanced abdominal MRI. 2. Small hyperdense left kidney lesion measures 1 cm and 49 HU. This may represent either and small enhancing neoplasm or hemorrhagic/proteinaceous cyst. This could also be further characterized with contrast enhanced abdominal MRI. 3. Single enlarged left posterior mediastinal lymph node measuring 1.4 cm. 4. Small exophytic cystic lesion arising from tail of pancreas measures 2.2 cm. Further characterization with contrast enhanced MR of the abdomen is advised. 5. 2 small nodules noted within the posterior costophrenic sulcus within the left lower lobe, nonspecific. 6. As an alternative to further imaging with contrast enhanced abdominal MRI a PET-CT may be considered to assess for areas of hypermetabolism which may help guide biopsy of any suspicious hypermetabolic lesions. Electronically Signed   By: Kerby Moors M.D.   On: 01/24/2018 21:32   Mr Jeri Cos FM Contrast  Result Date: 02/11/2018 CLINICAL DATA:  77 year old male with prior brain hemorrhages. History of atrial fibrillation, not on anticoagulation. Intracranial hemorrhages with follow-up MRI 01/23/2018 demonstrating multiple enhancing brain masses suggesting metastatic disease. Status post CT-guided biopsy of a left adrenal gland mass, pathology revealing Metastatic Melanoma. Treatment planning. EXAM: MRI HEAD WITHOUT AND WITH CONTRAST TECHNIQUE: Multiplanar, multiecho pulse sequences of the brain and surrounding structures were obtained without and with intravenous  contrast. CONTRAST:  10 milliliters Gadavist COMPARISON:  01/23/2018. FINDINGS: Study is intermittently degraded by motion artifact despite repeated imaging attempts. Brain: Bilateral frontal lobe hemorrhagic and enhancing masses with only occasional intrinsic T1 hyperintensity. A total of SIX lesions are identified if the right superior frontal lobe adjacent cystic and solid masses are treated as two (versus alternatively five overall if you consider one conglomerate cystic/solid mass measuring up to 4.3 centimeters as on series 13, image 30): - 7 millimeter round enhancing mass just anterior inferior to the  right basal ganglia on series 12, image 99. - 19 millimeter right inferior frontal gyrus mass near the operculum on series 12, image 110. - 14 millimeter left anterior inferior frontal gyrus mass on series 12, image 123. - 27 millimeter left middle frontal gyrus mass just above the operculum on image 126. - 37 millimeter largely cystic, rim enhancing mass in the superior right frontal lobe on image 134. - adjacent 38 millimeter solid right superior frontal gyrus mass on image 148. Most of these have mildly increased in size since November (the lesions on images 126 and 134 are relatively stable). Vasogenic edema associated with each of the lesions has not significantly changed since November. Additionally there is a tiny indeterminate area on series 12, image 147 in the superior right parietal lobe which is only identified on the axial images and does not have associated hemosiderin. This is probably artifact. No definite new brain mass identified. Overlying changes of bilateral craniotomy and/or burr hole placement which likely explains areas of mild dural thickening as postoperative in nature. Other areas of intracranial hemorrhage which are presumably benign. Persistent intraventricular hemorrhage layering in both occipital horns and along the right choroid plexus which is not abnormally enhancing.  Superimposed superficial siderosis. No midline shift. Basilar cisterns remain patent. No restricted diffusion or evidence of acute infarction. Stable ventricle size. Negative pituitary and cervicomedullary junction. Vascular: Major intracranial vascular flow voids are preserved. Skull and upper cervical spine: Negative visible cervical spine and spinal cord. Postoperative changes to the skull. Visualized bone marrow signal is within normal limits. Sinuses/Orbits: Stable and negative orbits. Small paranasal sinus mucous retention cysts are stable. Other: Mild mastoid effusions have regressed. No suspicious scalp or face soft tissue abnormality. IMPRESSION: 1. 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 No new lesions since November (see #2). All metastases are annotated on series 12. Most demonstrate mild enlargement since November, with the range of 7 to 38 mm diameter. Associated vasogenic edema has not significantly changed. No significant intracranial mass effect at this time. 2. Probable artifact in the superior right parietal lobe on series 12, image 147. Attention directed on follow-up. 3. 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 Electronically Signed   By: Genevie Ann M.D.   On: 02/11/2018 08:52   Mr Jeri Cos [place] Contrast  Result Date: 01/23/2018 CLINICAL DATA:  Headaches.  Intracranial hemorrhages. EXAM: MRI HEAD WITHOUT AND WITH CONTRAST TECHNIQUE: Multiplanar, multiecho pulse sequences of the brain and surrounding structures were obtained without and with intravenous contrast. CONTRAST:  10 mL Gadavist COMPARISON:  Head CT 01/22/2018 and MRI 12/12/2017 FINDINGS: Brain: A hemorrhagic right frontal mass demonstrates solid enhancement and measures 3.2 x 2.7 cm (series 15, image 130). A cystic component or separate cystic lesion/cystic  encephalomalacia inferior to the solid mass measures 4.2 x 2.6 cm and demonstrates a small amount of peripheral linear and nodular enhancement (series 15, image 116). A 1.7 cm hemorrhagic right frontal lobe mass anterior to the insula has enlarged from the prior MRI and now clearly enhances (series 15, image 90). A 1.2 cm enhancing mass in the anteromedial left frontal lobe is new or larger compared to the prior MRI (series 15, image 89). A 2.6 x 2.6 cm hemorrhagic lateral left frontal lobe mass has enlarged from the prior MRI and now also clearly has solid enhancing components. A 6 mm ring-enhancing inferior right  frontal lesion is new (series 15, image 79). Motion artifact could obscure additional small enhancing lesions. There is moderate vasogenic edema associated with the lateral left frontal mass with milder edema associated with other bilateral frontal masses. As seen on the recent head CT, there is a small amount of blood in the occipital horns of the lateral ventricles. There is also trace subarachnoid hemorrhage posteriorly in the sylvian fissures. There is not a significant chronic microhemorrhage burden to suggest cerebral amyloid angiopathy. There is moderate cerebral atrophy, and there is ex vacuo dilatation of the right frontal horn. There is no evidence of acute infarct, midline shift, or extra-axial fluid collection. Vascular: Major intracranial vascular flow voids are preserved. Skull and upper cervical spine: Bilateral burr holes related to prior subdural hematoma evacuation. No suspicious marrow lesion. Sinuses/Orbits: Bilateral cataract extraction. Small bilateral maxillary sinus mucous retention cysts. Small bilateral mastoid effusions. Other: None. IMPRESSION: 1. Multiple brain masses, many of which are hemorrhagic and now clearly demonstrate enhancement. These are consistent with metastases. 2. Up to moderate associated vasogenic edema.  No midline shift. 3. Small volume intraventricular and  subarachnoid hemorrhage. Electronically Signed   By: Logan Bores M.D.   On: 01/23/2018 17:51   Mr Abdomen W TK Contrast  Result Date: 01/26/2018 CLINICAL DATA:  Inpatient. Multiple enhancing hemorrhagic bilateral cerebral masses compatible with metastatic disease of uncertain primary. Indeterminate left adrenal mass, left renal mass and cystic pancreatic tail mass on recent CT abdomen study. EXAM: MRI ABDOMEN WITHOUT AND WITH CONTRAST TECHNIQUE: Multiplanar multisequence MR imaging of the abdomen was performed both before and after the administration of intravenous contrast. CONTRAST:  10 cc Gadavist IV. COMPARISON:  01/24/2018 CT chest, abdomen and pelvis. FINDINGS: Significantly motion degraded scan, limiting assessment. Lower chest: Enlarged 1.4 cm left lower posterior mediastinal para-aortic node (series 5/image 4), unchanged from 01/24/2018 CT. Hepatobiliary: Normal liver size and configuration. Mild diffuse hepatic steatosis. No liver mass. Normal gallbladder with no cholelithiasis. No biliary ductal dilatation. Common bile duct diameter 3 mm. No choledocholithiasis. Pancreas: There is a unilocular cystic pancreatic lesion measuring 2.4 x 1.8 x 2.0 cm arising exophytically from the anterior pancreatic tail (series 5/image 12), which demonstrates mild wall thickening and enhancement along the posterior margin, with no internal septations. Subcentimeter simple unilocular nonenhancing pancreatic body cystic lesion. No pancreatic duct dilation. No pancreas divisum. Spleen: Normal size. No mass. Adrenals/Urinary Tract: Normal right adrenal. Left adrenal 4.6 x 3.3 cm mass (series 8/image 18) demonstrates mildly heterogeneous signal intensity on T1 and T2 weighted sequences with heterogeneous enhancement and absence of consistent loss of signal intensity on out of phase chemical shift imaging. No hydronephrosis. Several simple renal cysts in both kidneys, largest 2.7 cm in the interpolar left kidney. There is a  1.4 x 1.1 cm mildly T2 hyperintense and T1 isointense renal cortical lesion in the anterior interpolar left kidney (series 8/image 22) without convincing enhancement on the limited motion degraded postcontrast sequences, probably a Bosniak category 2 hemorrhagic/proteinaceous renal cyst. No overtly suspicious renal masses. Stomach/Bowel: Normal non-distended stomach. Visualized small and large bowel is normal caliber, with no bowel wall thickening. Vascular/Lymphatic: Normal caliber abdominal aorta. Patent portal, splenic, hepatic and renal veins. No pathologically enlarged lymph nodes in the abdomen. Other: No abdominal ascites or focal fluid collection. Musculoskeletal: No aggressive appearing focal osseous lesions. IMPRESSION: 1. Limited motion degraded scan. 2. Heterogeneous enhancing 4.6 cm left adrenal mass remains indeterminate by MRI, with no convincing intralesional lipid. Left adrenal metastasis cannot be excluded. Tissue  sampling may be considered. 3. No overtly suspicious renal masses. Probable 1.4 cm Bosniak category 2 hemorrhagic/proteinaceous renal cyst in the anterior interpolar left kidney, for which follow-up MRI abdomen without and with IV contrast is recommended in 6-12 months given the limitations of this motion degraded scan. 4. 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 No pancreatic duct dilation. GI consultation for consideration of EUS/FNA is indicated. This recommendation follows ACR consensus guidelines: Management of Incidental Pancreatic Cysts: A White Paper of the ACR Incidental Findings Committee. [place] 8127;51:700-174. 5. Redemonstration of nonspecific mild left lower mediastinal adenopathy. 6. Mild diffuse hepatic steatosis. Electronically Signed   By: Ilona Sorrel M.D.   On: 01/26/2018 18:09   Ct Abdomen Pelvis W Contrast  Result Date: 01/24/2018 CLINICAL DATA:   Evaluate for metastatic disease. Initial cancer workup. EXAM: CT CHEST, ABDOMEN, AND PELVIS WITH CONTRAST TECHNIQUE: Multidetector CT imaging of the chest, abdomen and pelvis was performed following the standard protocol during bolus administration of intravenous contrast. CONTRAST:  167m OMNIPAQUE IOHEXOL 300 MG/ML  SOLN COMPARISON:  None. FINDINGS: CT CHEST FINDINGS Cardiovascular: Heart size appears within normal limits. No pericardial effusion identified. Aortic atherosclerosis. Calcifications within the LAD and RCA coronary arteries noted. Mediastinum/Nodes: Normal appearance of the thyroid gland. The trachea appears patent and is midline. No axillary or supraclavicular adenopathy. Within the left posterior mediastinum there is a lymph node measuring 1.4 cm, image 37/3. Lungs/Pleura: No pleural effusion, airspace consolidation or atelectasis. Pulmonary nodule within the posterior costophrenic sulcus measures 9 mm, image 124/5. Also within the posterior left costophrenic sulcus is a 8 mm lung nodule, image 115/5. Musculoskeletal: No chest wall mass or suspicious bone lesions identified. CT ABDOMEN PELVIS FINDINGS Hepatobiliary: No focal liver abnormality is seen. No gallstones, gallbladder wall thickening, or biliary dilatation. Pancreas: Cystic lesion arising from the tail of pancreas measures 1.9 by 2.2 cm, image 52/3. No inflammation or main duct dilatation. Spleen: Spleen is normal. Adrenals/Urinary Tract: Normal appearance of the right adrenal gland. Left adrenal nodule measures 4.4 by 3.3 by 5.0 cm and measures 46 HU. Normal appearance of the right kidney. Multiple exophytic cysts are noted arising from the upper and lower pole of left kidney. Indeterminate exophytic lesion arising from the anterior cortex of the interpolar left kidney measures 1 cm and 49 HU, image 64/3. No hydronephrosis identified bilaterally. No focal bladder lesion identified. Stomach/Bowel: Stomach appears normal. The small bowel  loops have a normal course and caliber. No pathologic dilatation of the colon. Distal colonic diverticula noted without acute inflammation. Vascular/Lymphatic: Aortic atherosclerosis. No aneurysm. No abdominopelvic adenopathy. Reproductive: Prostate is unremarkable. Other: No abdominal wall hernia or abnormality. No abdominopelvic ascites. Musculoskeletal: Spondylosis noted within the lumbar spine. No aggressive lytic or sclerotic bone lesion. IMPRESSION: 1. No definite primary neoplasm identified. Several nonspecific findings are noted within the chest in abdomen including a 5 cm left adrenal mass. This may represent a benign or malignant tumor. Metastatic disease not excluded. More definitive characterization could be obtained with contrast enhanced abdominal MRI. 2. Small hyperdense left kidney lesion measures 1 cm and 49 HU. This may represent either and small enhancing neoplasm or hemorrhagic/proteinaceous cyst. This could also be further characterized with contrast enhanced abdominal MRI. 3. Single enlarged left posterior mediastinal lymph node measuring 1.4 cm. 4. Small exophytic cystic lesion arising from tail of pancreas measures 2.2 cm. Further characterization with contrast enhanced MR of the abdomen  is advised. 5. 2 small nodules noted within the posterior costophrenic sulcus within the left lower lobe, nonspecific. 6. As an alternative to further imaging with contrast enhanced abdominal MRI a PET-CT may be considered to assess for areas of hypermetabolism which may help guide biopsy of any suspicious hypermetabolic lesions. Electronically Signed   By: Kerby Moors M.D.   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 Please perform CT-guided left adrenal gland mass biopsy for tissue diagnostic purposes. EXAM: CT-GUIDED BIOPSY INDETERMINATE LEFT ADRENAL GLAND MASS COMPARISON:  CT of  the chest, abdomen and pelvis - 01/24/2018; abdominal MRI-01/26/2018 MEDICATIONS: None. ANESTHESIA/SEDATION: Fentanyl 50 mcg IV; Versed 1 mg IV Sedation time: 15 minutes; The patient was continuously monitored during the procedure by the interventional radiology nurse under my direct supervision. CONTRAST:  None. COMPLICATIONS: None immediate. PROCEDURE: Informed consent was obtained from the patient following an explanation of the procedure, risks, benefits and alternatives. A time out was performed prior to the initiation of the procedure. The patient was positioned prone on the CT table and a limited CT was performed for procedural planning demonstrating unchanged size and appearance of the at least 4.0 x 3.2 cm left adrenal gland mass (image 42, series 2). The procedure was planned. The operative site was prepped and draped in the usual sterile fashion. Appropriate trajectory was confirmed with a 22 gauge spinal needle after the adjacent tissues were anesthetized with 1% Lidocaine with epinephrine. Under intermittent CT guidance, a 17 gauge coaxial needle was advanced into the peripheral aspect of the mass. Appropriate positioning was confirmed and 4 core needle biopsy samples were obtained with an 18 gauge core needle biopsy device. The co-axial needle was removed following the administration of a Gel-Foam slurry and superficial hemostasis was achieved with manual compression. A limited postprocedural CT was negative for significant peri biopsy hemorrhage or additional complication. A dressing was placed. The patient tolerated the procedure well without immediate postprocedural complication. IMPRESSION: Technically successful CT guided core needle biopsy of indeterminate left adrenal gland mass. Electronically Signed   By: Sandi Mariscal M.D.   On: 01/29/2018 12:22    ASSESSMENT & PLAN:  77 y.o. 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 MRI  brain and CT head reviewed as noted above CT chest/abd /pelvis - do not demonstrate evidence of an unequivocal site of primary lesion.  2) left adrenal gland lesion unclear etiology benign incidentaloma versus malignancy  PLAN: -Discussed pt labwork from 01/25/18 CBC was normal -Discussed the 02/11/18 MRI Brain which revealed Total of 6 melanoma metastases in the brain (if adjacent right superior frontal lobe cystic and solid masses are treated as two). No new lesions since November (see #2). All metastases are annotated on series 12. Most demonstrate mild enlargement since November, with the range of 7 to 38 mm diameter. Associated vasogenic edema has not significantly changed. No significant intracranial mass effect at this time. 2. Probable artifact in the superior right parietal lobe on series 12, image 147. Attention directed on follow-up. 3. 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 Nevada Crane for a full skin screening and further evaluation -Continue with [facility] treatment with Dr. Isidore Moos in [place], which will begin on 02/14/18 -Will order PET/CT -Advised that the pt not drive at this time given the known brain involvement  -Will order urine study today to rule out UTI and additional labs today  -Anticoagulation is on hold due to intraparenchymal hemorrhage. -Will see the pt back in 2 weeks   Labs today PET/CT in 1 week Dermatology referral to Dr Nevada Crane for skin screening (patient will call- already an established patient) RTC with Dr Irene Limbo in 2 weeks     All of the patients  questions were answered with apparent satisfaction. The patient knows to call the clinic with any problems, questions or concerns.  The total time spent in the appt was 40 minutes and more than 50% was on counseling and direct patient cares.    Sullivan Lone MD MS AAHIVMS [facility] Hematology/Oncology Physician [facility]  (Office):       [phone] (Work cell):  [phone] (Fax):           [phone]  02/12/2018 1:32 PM  I, Baldwin [place], am acting as a scribe for Dr. Sullivan Lone.   .I have reviewed the above documentation for accuracy and completeness, and I agree with the above. Brunetta Genera MD

## 2018-02-11 NOTE — Progress Notes (Signed)
  Radiation Oncology         [phone] ________________________________  Name: [name] [name] MRN: [id number]  Date: 02/11/2018  DOB: 07-30-1940  Outpatient  SIMULATION AND TREATMENT PLANNING NOTE; SPECIAL TREATMENT PROCEDURE NOTE:   DIAGNOSIS:    ICD-10-CM   1. Brain metastases ([place]) C79.31     NARRATIVE:  The patient was brought to the [facility].  Identity was confirmed.  All relevant records and images related to the planned course of therapy were reviewed.  The patient freely provided informed written consent to proceed with treatment after reviewing the details related to the planned course of therapy. The consent form was witnessed and verified by the simulation staff. Intravenous access was established for contrast administration. Then, the patient was set-up in a stable reproducible supine position for radiation therapy.  A relocatable thermoplastic stereotactic head frame was fabricated for precise immobilization.  CT images were obtained.  Surface markings were placed.  The CT images were loaded into the planning software and fused with the patient's targeting MRI scan.  Then the target and avoidance structures were contoured.  Treatment planning then occurred.  The radiation prescription was entered and confirmed.  I have requested 3D planning  I have requested a DVH of the following structures: Brain stem, brain, left eye, right eye, lenses, optic chiasm, target volumes, uninvolved brain, and normal tissue.    PLAN:  The patient will receive 27 Gy in 3 fractions to the dominant right frontal mass.  The other 4 masses will receive 18-20 Gy in 1 fraction. Stereotactic radiosurgery will be used.  SPECIAL TREATMENT PROCEDURE NOTE:  This constitutes a special treatment procedure due to the ablative dose that will be delivered and the technical nature of treatment.  This highly technical modality of treatment ensures that the ablative dose is centered on the patient's  tumor while sparing normal tissues from excessive dose and risk of detrimental effects.  -----------------------------------  Eppie Gibson, MD

## 2018-02-11 NOTE — Progress Notes (Signed)
Radiation Oncology         [phone]) [phone] ________________________________  Initial outpatient Consultation  Name: [name] [name] MRN: [id number]  Date: 02/11/2018  DOB: 01-11-1941  [id number], [name] Kaufmann, MD  [name] Part, MD   REFERRING PHYSICIAN: Judith Part, MD  DIAGNOSIS: The encounter diagnosis was Brain metastases [facility]).    ICD-10-CM   1. Brain metastases ([place]) C79.31    HISTORY OF PRESENT ILLNESS::[name] [name] is a 77 y.o. male who is accompanied today by his daughter and his sister.   He has had a prior history of headaches and intracranial hemorrhages. He was evaluated by Neuro-Oncologist, Dr. Mickeal [name] while admitted to the hospital on 01/27/2018. He was placed on 4 mg decadron daily by Dr. Mickeal [name] that has helped with his headaches. He doesn't have any headaches at this time.   He denies nausea, unilateral weakness, unilateral numbness, seizures, abdominal pain, trouble urinating, bowel issues, bleeding, and trouble swallowing. He has had some mild memory issues and equilibrium issues w/ walking.Marland Kitchen He has associated symptoms of weight loss (15 lbs) and decreased appetite. He has had two areas that were suspicious for melanoma to the center of his chest and his back that were burned off. However, he wasn't sure if they were known melanoma due to no pathology being completed at the time. The areas of melanoma were removed several years ago. Family notes that the patient hasn't had any shifts in his personality. He doesn't smoke cigarettes. He retired from [employer] and he lives in [place], [place].   He has an appointment with Medical Oncologist, Dr. Irene [name] on 02/12/2018. He has an appointment with Neurology on 03/12/2018. He also has a follow up appointment with Dr. Mickeal [name] on 03/21/2018.   MRI of the brain on 01/23/2018 demonstrated Multiple brain masses, many of which are hemorrhagic and now clearly demonstrate enhancement. These are consistent with metastases. Up to  moderate associated vasogenic edema.  No midline shift. Small volume intraventricular and subarachnoid hemorrhage.  CT CAP with contrast on 01/24/2018 showed:  Subcentimeter pulmonary nodules, nonspecific (2). No definite primary neoplasm identified. Several nonspecific findings are noted within the chest in abdomen including a 5 cm left adrenal mass. This may represent a benign or malignant tumor. Metastatic disease not excluded. More definitive characterization could be obtained with contrast enhanced abdominal MRI. Small hyperdense left kidney lesion measures 1 cm and 49 HU. This may represent either and small enhancing neoplasm or hemorrhagic/proteinaceous cyst.  Single enlarged left posterior mediastinal lymph node measuring 1.4 cm. Small exophytic cystic lesion arising from tail of pancreas measures 2.2 cm. 2 small nodules noted within the posterior costophrenic sulcuswithin the left lower lobe, nonspecific.   MRI Abdomen on 01/26/2018 showed: Limited motion degraded scan. Heterogeneous enhancing 4.6 cm left adrenal mass remains indeterminate by MRI, with no convincing intralesional lipid. Left adrenal metastasis cannot be excluded. Tissue sampling may be considered. No overtly suspicious renal masses. Probable 1.4 cm Bosniak category 2 hemorrhagic/proteinaceous renal cyst in the anterior interpolar left kidney, for which follow-up MRI abdomen without and with IV contrast is recommended in 6-12 months given the limitations of this motion degraded scan. 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 No pancreatic duct dilation. GI consultation for consideration of EUS/FNA is indicated. Redemonstration of nonspecific mild left lower mediastinal Adenopathy. Mild diffuse hepatic steatosis.  On 01/29/2018, he underwent a soft tissue Needle Core Biopsy, Left with metastatic  melanoma. Immunohistochemical stains show  that the tumor cells are positive for S100 and SOX-10; and are negative for p63, CK 5/6, TTF-1, Napsin, and CK7 and CK20, consistent with the above diagnosis.  His most recent MRI Brain was on 02/10/2018 and showed: Total of 6 melanoma metastases in the brain (if adjacent right superior frontal lobe cystic and solid masses are treated as two). No new lesions since November (see #2). All metastases are annotated on series 12. Most demonstrate mild enlargement since November, with the range of 7 to 38 mm diameter. Associated vasogenic edema has not significantly changed. No significant intracranial mass effect at this time. Probable artifact in the superior right parietal lobe on series 12, image 147. Attention directed on follow-up. 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  I personally reviewed the patient's imaging prior to today's visit.             PREVIOUS RADIATION THERAPY: No  PAST MEDICAL HISTORY:  has a past medical history of Arthritis, Atrial fibrillation ([place]), Cataract, Diabetes mellitus without complication ([place]), Hyperlipidemia, HYPERTENSION, HYPOTHYROIDISM, Legally blind in left eye, as defined in [place], Pre-diabetes, Precancerous skin lesion, ROTATOR CUFF TEAR, and Subdural hematoma ([place]).    PAST SURGICAL HISTORY: Past Surgical History:  Procedure Laterality Date  . APPENDECTOMY    . CATARACT EXTRACTION W/PHACO Right 02/02/2014   Procedure: CATARACT EXTRACTION PHACO AND INTRAOCULAR LENS PLACEMENT (IOC);  Surgeon: Elta Guadeloupe T. Gershon Crane, MD;  Location: [facility];  Service: Ophthalmology;  Laterality: Right;  CDE 12.17  . CATARACT EXTRACTION W/PHACO Left 02/16/2014   Procedure: CATARACT EXTRACTION PHACO AND INTRAOCULAR LENS PLACEMENT ;  Surgeon: Elta Guadeloupe T. Gershon Crane, MD;  Location: [facility];  Service: Ophthalmology;  Laterality: Left;  CDE:13.42  . CRANIOTOMY N/A 02/05/2017   Procedure: Bilateral Burr Holes . Craniotomy  for Subdural;  Surgeon: Ditty, Kevan Ny, MD;  Location: [facility];  Service: Neurosurgery;  Laterality: N/A;  . EYE SURGERY  2015   cataract surgery. bilateral  . JOINT REPLACEMENT  2009   bilateral knee replacement  . KNEE SURGERY    . ROTATOR CUFF REPAIR Left   . SHOULDER SURGERY    . TOTAL KNEE ARTHROPLASTY Bilateral     FAMILY HISTORY: family history includes Arthritis in his brother; Asthma in his father; Early death in his brother; Heart attack in his brother; Heart attack (age of onset: 52) in his brother; Hypertension in his mother; Vision loss in his father.  SOCIAL HISTORY:  reports that he has never smoked. He has never used smokeless tobacco. He reports that he does not drink alcohol or use drugs.  ALLERGIES: Patient has no known allergies.  MEDICATIONS:  Current Outpatient Medications  Medication Sig Dispense Refill  . albuterol (PROVENTIL HFA;VENTOLIN HFA) 108 (90 Base) MCG/ACT inhaler Inhale 2 puffs into the lungs every 6 (six) hours as needed for wheezing or shortness of breath. 1 Inhaler 2  . atorvastatin (LIPITOR) 10 MG tablet Take 1 tablet (10 mg total) by mouth daily. 90 tablet 0  . dexamethasone (DECADRON) 4 MG tablet Take 1 tablet (4 mg total) by mouth daily. 30 tablet 0  . flecainide (TAMBOCOR) 100 MG tablet TAKE 1 TABLET (100 MG TOTAL) BY MOUTH 2 (TWO) TIMES DAILY. 180 tablet 1  . levothyroxine (SYNTHROID, LEVOTHROID) 50 MCG tablet TAKE 2 TABLETS BY MOUTH DAILY (Patient taking differently: Take 100 mcg by mouth daily before breakfast. ) 180 tablet 2  . metFORMIN (GLUCOPHAGE-XR) 500 MG 24 hr  tablet Take 1 tablet (500 mg total) by mouth daily with breakfast. (Need to be seen) 90 tablet 0   No current facility-administered medications for this encounter.     REVIEW OF SYSTEMS:  A 10+ POINT REVIEW OF SYSTEMS WAS OBTAINED including neurology, dermatology, psychiatry, cardiac, respiratory, lymph, extremities, GI, GU, Musculoskeletal, constitutional, breasts,  reproductive, HEENT.  All pertinent positives are noted in the HPI.  All others are negative.    PHYSICAL EXAM:  height is 5\' 9"  (1.753 m) and weight is 264 lb 9.6 oz (120 kg). His oral temperature is 98.1 F (36.7 C). His blood pressure is 117/81 and his pulse is 69. His oxygen saturation is 97%.   General: Alert and oriented, in no acute distress. HEENT: Head is normocephalic. Extraocular movements are intact.  Left eye deviates medially (he reports this has been the case all his life). Oropharynx is clear. Mucous membranes are moist. No thrush noted.  Neck: Neck is supple, no palpable cervical or supraclavicular lymphadenopathy.  Heart: Regular in rate and rhythm with no murmurs, rubs, or gallops. Chest: Clear to auscultation bilaterally, with no rhonchi, wheezes, or rales.  Abdomen: Soft, nontender, nondistended, with no rigidity or guarding.  Extremities: No cyanosis or edema. No swelling to his bilateral ankles.  Lymphatics: see Neck Exam Skin: No concerning lesions. Skin shows some signs of prior sun damage and he has bruises on his inner forearms bilaterally.  Musculoskeletal: symmetric strength and muscle tone throughout. Neurologic: Cranial nerves II through XII are grossly intact. No obvious focalities. Speech is fluent. Coordination is intact. Psychiatric: Judgment and insight are intact. Affect is appropriate.  KPS = 80 100 - Normal; no complaints; no evidence of disease. 90   - Able to carry on normal activity; minor signs or symptoms of disease. 80   - Normal activity with effort; some signs or symptoms of disease. 40   - Cares for self; unable to carry on normal activity or to do active work. 60   - Requires occasional assistance, but is able to care for most of his personal needs. 50   - Requires considerable assistance and frequent medical care. 29   - Disabled; requires special care and assistance. 18   - Severely disabled; hospital admission is indicated although death  not imminent. 70   - Very sick; hospital admission necessary; active supportive treatment necessary. 10   - Moribund; fatal processes progressing rapidly. 0     - Dead  Karnofsky DA, Abelmann [place], Craver LS and [place] J[facility] [phone]) The use of the nitrogen mustards in the palliative treatment of carcinoma: with particular reference to bronchogenic carcinoma Cancer 1 [id number]     LABORATORY DATA:  Lab Results  Component Value Date   WBC 10.9 (H) 01/29/2018   HGB 14.8 01/29/2018   HCT 45.1 01/29/2018   MCV 86.4 01/29/2018   PLT 272 01/29/2018   CMP     Component Value Date/Time   NA 134 (L) 01/29/2018 0426   NA 144 05/13/2017 1105   K 4.3 01/29/2018 0426   CL 102 01/29/2018 0426   CO2 23 01/29/2018 0426   GLUCOSE 133 (H) 01/29/2018 0426   BUN 23 01/29/2018 0426   BUN 11 05/13/2017 1105   CREATININE 1.01 01/29/2018 0426   CALCIUM 8.5 (L) 01/29/2018 0426   PROT 7.7 01/22/2018 2358   PROT 6.7 05/13/2017 1105   ALBUMIN 4.3 01/22/2018 2358   ALBUMIN 4.1 05/13/2017 1105   AST 27 01/22/2018 2358   ALT  36 01/22/2018 2358   ALKPHOS 66 01/22/2018 2358   BILITOT 0.9 01/22/2018 2358   BILITOT 0.3 05/13/2017 1105   GFRNONAA >60 01/29/2018 0426   GFRAA >60 01/29/2018 0426         RADIOGRAPHY: Ct Head Wo Contrast  Result Date: 01/24/2018 CLINICAL DATA:  Follow-up examination for acute stroke. EXAM: CT HEAD WITHOUT CONTRAST TECHNIQUE: Contiguous axial images were obtained from the base of the skull through the vertex without intravenous contrast. COMPARISON:  Previous CT from 01/22/2018 as well as brain MRI from 01/23/2018. FINDINGS: Brain: Multiple hemorrhagic brain masses again seen, relatively stable and unchanged in size as compared to most recent CT. Largest right frontal lobe mass measures 3.6 x 2.4 cm. Dominant left frontal lesion measures 2.7 x 2.8 cm. Mass at the left gyrus rectus measures 1.4 x 1.2 cm. Additional right frontal operculum lower lesion measures 1.5 x 1.5 cm.  Surrounding vasogenic edema about several of these lesions, most notable at the left frontal lesion. No midline shift. Basilar cisterns remain patent. Cystic encephalomalacia at the adjacent right frontal lesion with is small amount of layering hemorrhage again noted. Intraventricular extension with small amount of blood seen layering within the occipital horns, also unchanged. No other new intracranial hemorrhage or mass lesion identified. No acute large vessel territory infarct. No extra-axial fluid collection. Atrophy with chronic microvascular ischemic disease again noted. Vascular: No hyperdense vessel. Scattered vascular calcifications noted within the carotid siphons. Skull: Scalp soft tissues demonstrate no acute finding. Sequelae of prior bilateral burr hole craniotomy. Sinuses/Orbits: Globes orbital soft tissues within normal limits. Patient status post bilateral ocular lens replacement. Paranasal sinuses remain largely clear. Small bilateral maxillary sinus retention cyst noted. Small bilateral mastoid effusions noted. Other: None. IMPRESSION: 1. Relatively stable appearance of multifocal hemorrhagic masses with localized edema as above. No midline shift. 2. Associated small volume intraventricular hemorrhage, relatively unchanged. Stable ventricular size without hydrocephalus. 3. No other new acute intracranial abnormality. Electronically Signed   By: Jeannine Boga M.D.   On: 01/24/2018 02:17   Ct Head Wo Contrast  Result Date: 01/23/2018 CLINICAL DATA:  Initial evaluation for acute headache, recent intracranial hemorrhages. EXAM: CT HEAD WITHOUT CONTRAST TECHNIQUE: Contiguous axial images were obtained from the base of the skull through the vertex without intravenous contrast. COMPARISON:  Prior CT from 12/17/2017. FINDINGS: Brain: Since the previous exam, the right frontal intraparenchymal hemorrhage has enlarged in size now measuring 3.5 x 2.4 x 1.9 cm (series 2, image 25). Localized edema  within this region overall slightly improved. Adjacent cystic encephalomalacia of now contains a small amount of layering hemorrhage (series 2, image 22). Additional left frontal intraparenchymal hematoma also increased in size now measuring 3.1 x 2.5 x 2.5 cm (series 2, image 22, previously 1.8 cm). Localized vasogenic edema slightly worsened from previous. Additional right frontotemporal hemorrhage also increased in size now measuring 1.5 x 1.7 x 1.1 cm (series 2, image 17). Localized vasogenic edema has worsened without significant regional mass effect. Additional soft tissue density at the parasagittal aspect of the anterior inferior left frontal lobe measures 1.5 x 1.2 x 1.3 cm with mild localized edema without midline shift. This may have been present on prior CT, although difficult to see on prior exam. Finding also likely reflects a small intraparenchymal hemorrhage. New small volume intraventricular hemorrhage with blood seen layering within the occipital horns of both lateral ventricles, likely via extension from the right frontal ventricular size is relatively stable without interval hydrocephalus. Basilar cisterns remain patent.  No other acute intracranial hemorrhage. No subarachnoid or extra-axial hemorrhage appreciated. No acute large vessel territory infarct. Underlying cerebral atrophy, stable. Vascular: No hyperdense vessel. Scattered vascular calcifications noted within the carotid siphons. Skull: Scalp soft tissues demonstrate no acute abnormality. Sequelae of prior bilateral burr hole craniotomy. Sinuses/Orbits: Globes and orbital soft tissues within normal limits. Small bilateral maxillary sinus retention cyst noted. Paranasal sinuses are otherwise clear. Small chronic right mastoid effusion noted. Other: None. IMPRESSION: 1. Interval increase in size of bilateral intraparenchymal hematomas as above. Localized vasogenic edema without significant midline shift. 2. New small volume  intraventricular hemorrhage, likely via extension from the right frontal hematoma and adjacent cystic encephalomalacia. Stable ventricular size without hydrocephalus or ventricular trapping at this time. Critical Value/emergent results were called by telephone at the time of interpretation on 01/23/2018 at 12:13 am to Dr. Rolland Porter , who verbally acknowledged these results. Electronically Signed   By: Jeannine Boga M.D.   On: 01/23/2018 00:18   Ct Chest W Contrast  Result Date: 01/24/2018 CLINICAL DATA:  Evaluate for metastatic disease. Initial cancer workup. EXAM: CT CHEST, ABDOMEN, AND PELVIS WITH CONTRAST TECHNIQUE: Multidetector CT imaging of the chest, abdomen and pelvis was performed following the standard protocol during bolus administration of intravenous contrast. CONTRAST:  166mL OMNIPAQUE IOHEXOL 300 MG/ML  SOLN COMPARISON:  None. FINDINGS: CT CHEST FINDINGS Cardiovascular: Heart size appears within normal limits. No pericardial effusion identified. Aortic atherosclerosis. Calcifications within the LAD and RCA coronary arteries noted. Mediastinum/Nodes: Normal appearance of the thyroid gland. The trachea appears patent and is midline. No axillary or supraclavicular adenopathy. Within the left posterior mediastinum there is a lymph node measuring 1.4 cm, image 37/3. Lungs/Pleura: No pleural effusion, airspace consolidation or atelectasis. Pulmonary nodule within the posterior costophrenic sulcus measures 9 mm, image 124/5. Also within the posterior left costophrenic sulcus is a 8 mm lung nodule, image 115/5. Musculoskeletal: No chest wall mass or suspicious bone lesions identified. CT ABDOMEN PELVIS FINDINGS Hepatobiliary: No focal liver abnormality is seen. No gallstones, gallbladder wall thickening, or biliary dilatation. Pancreas: Cystic lesion arising from the tail of pancreas measures 1.9 by 2.2 cm, image 52/3. No inflammation or main duct dilatation. Spleen: Spleen is normal.  Adrenals/Urinary Tract: Normal appearance of the right adrenal gland. Left adrenal nodule measures 4.4 by 3.3 by 5.0 cm and measures 46 HU. Normal appearance of the right kidney. Multiple exophytic cysts are noted arising from the upper and lower pole of left kidney. Indeterminate exophytic lesion arising from the anterior cortex of the interpolar left kidney measures 1 cm and 49 HU, image 64/3. No hydronephrosis identified bilaterally. No focal bladder lesion identified. Stomach/Bowel: Stomach appears normal. The small bowel loops have a normal course and caliber. No pathologic dilatation of the colon. Distal colonic diverticula noted without acute inflammation. Vascular/Lymphatic: Aortic atherosclerosis. No aneurysm. No abdominopelvic adenopathy. Reproductive: Prostate is unremarkable. Other: No abdominal wall hernia or abnormality. No abdominopelvic ascites. Musculoskeletal: Spondylosis noted within the lumbar spine. No aggressive lytic or sclerotic bone lesion. IMPRESSION: 1. No definite primary neoplasm identified. Several nonspecific findings are noted within the chest in abdomen including a 5 cm left adrenal mass. This may represent a benign or malignant tumor. Metastatic disease not excluded. More definitive characterization could be obtained with contrast enhanced abdominal MRI. 2. Small hyperdense left kidney lesion measures 1 cm and 49 HU. This may represent either and small enhancing neoplasm or hemorrhagic/proteinaceous cyst. This could also be further characterized with contrast enhanced abdominal MRI. 3. Single  enlarged left posterior mediastinal lymph node measuring 1.4 cm. 4. Small exophytic cystic lesion arising from tail of pancreas measures 2.2 cm. Further characterization with contrast enhanced MR of the abdomen is advised. 5. 2 small nodules noted within the posterior costophrenic sulcus within the left lower lobe, nonspecific. 6. As an alternative to further imaging with contrast enhanced  abdominal MRI a PET-CT may be considered to assess for areas of hypermetabolism which may help guide biopsy of any suspicious hypermetabolic lesions. Electronically Signed   By: Kerby Moors M.D.   On: 01/24/2018 21:32   Mr Jeri Cos YJ Contrast  Result Date: 02/11/2018 CLINICAL DATA:  77 year old male with prior brain hemorrhages. History of atrial fibrillation, not on anticoagulation. Intracranial hemorrhages with follow-up MRI 01/23/2018 demonstrating multiple enhancing brain masses suggesting metastatic disease. Status post CT-guided biopsy of a left adrenal gland mass, pathology revealing Metastatic Melanoma. Treatment planning. EXAM: MRI HEAD WITHOUT AND WITH CONTRAST TECHNIQUE: Multiplanar, multiecho pulse sequences of the brain and surrounding structures were obtained without and with intravenous contrast. CONTRAST:  10 milliliters Gadavist COMPARISON:  01/23/2018. FINDINGS: Study is intermittently degraded by motion artifact despite repeated imaging attempts. Brain: Bilateral frontal lobe hemorrhagic and enhancing masses with only occasional intrinsic T1 hyperintensity. A total of SIX lesions are identified if the right superior frontal lobe adjacent cystic and solid masses are treated as two (versus alternatively five overall if you consider one conglomerate cystic/solid mass measuring up to 4.3 centimeters as on series 13, image 30): - 7 millimeter round enhancing mass just anterior inferior to the right basal ganglia on series 12, image 99. - 19 millimeter right inferior frontal gyrus mass near the operculum on series 12, image 110. - 14 millimeter left anterior inferior frontal gyrus mass on series 12, image 123. - 27 millimeter left middle frontal gyrus mass just above the operculum on image 126. - 37 millimeter largely cystic, rim enhancing mass in the superior right frontal lobe on image 134. - adjacent 38 millimeter solid right superior frontal gyrus mass on image 148. Most of these have mildly  increased in size since November (the lesions on images 126 and 134 are relatively stable). Vasogenic edema associated with each of the lesions has not significantly changed since November. Additionally there is a tiny indeterminate area on series 12, image 147 in the superior right parietal lobe which is only identified on the axial images and does not have associated hemosiderin. This is probably artifact. No definite new brain mass identified. Overlying changes of bilateral craniotomy and/or burr hole placement which likely explains areas of mild dural thickening as postoperative in nature. Other areas of intracranial hemorrhage which are presumably benign. Persistent intraventricular hemorrhage layering in both occipital horns and along the right choroid plexus which is not abnormally enhancing. Superimposed superficial siderosis. No midline shift. Basilar cisterns remain patent. No restricted diffusion or evidence of acute infarction. Stable ventricle size. Negative pituitary and cervicomedullary junction. Vascular: Major intracranial vascular flow voids are preserved. Skull and upper cervical spine: Negative visible cervical spine and spinal cord. Postoperative changes to the skull. Visualized bone marrow signal is within normal limits. Sinuses/Orbits: Stable and negative orbits. Small paranasal sinus mucous retention cysts are stable. Other: Mild mastoid effusions have regressed. No suspicious scalp or face soft tissue abnormality. IMPRESSION: 1. 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 No new lesions since November (see #2). All metastases are annotated on series  12. Most demonstrate mild enlargement since November, with the range of 7 to 38 mm diameter. Associated vasogenic edema has not significantly changed. No significant intracranial mass effect at this time. 2. Probable artifact in the superior right parietal lobe  on series 12, image 147. Attention directed on follow-up. 3. 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 Electronically Signed   By: Genevie Ann M.D.   On: 02/11/2018 08:52   Mr Jeri Cos XB Contrast  Result Date: 01/23/2018 CLINICAL DATA:  Headaches.  Intracranial hemorrhages. EXAM: MRI HEAD WITHOUT AND WITH CONTRAST TECHNIQUE: Multiplanar, multiecho pulse sequences of the brain and surrounding structures were obtained without and with intravenous contrast. CONTRAST:  10 mL Gadavist COMPARISON:  Head CT 01/22/2018 and MRI 12/12/2017 FINDINGS: Brain: A hemorrhagic right frontal mass demonstrates solid enhancement and measures 3.2 x 2.7 cm (series 15, image 130). A cystic component or separate cystic lesion/cystic encephalomalacia inferior to the solid mass measures 4.2 x 2.6 cm and demonstrates a small amount of peripheral linear and nodular enhancement (series 15, image 116). A 1.7 cm hemorrhagic right frontal lobe mass anterior to the insula has enlarged from the prior MRI and now clearly enhances (series 15, image 90). A 1.2 cm enhancing mass in the anteromedial left frontal lobe is new or larger compared to the prior MRI (series 15, image 89). A 2.6 x 2.6 cm hemorrhagic lateral left frontal lobe mass has enlarged from the prior MRI and now also clearly has solid enhancing components. A 6 mm ring-enhancing inferior right frontal lesion is new (series 15, image 79). Motion artifact could obscure additional small enhancing lesions. There is moderate vasogenic edema associated with the lateral left frontal mass with milder edema associated with other bilateral frontal masses. As seen on the recent head CT, there is a small amount of blood in the occipital horns of the lateral ventricles. There is also trace subarachnoid hemorrhage posteriorly in the sylvian fissures. There is not a significant chronic microhemorrhage burden to suggest  cerebral amyloid angiopathy. There is moderate cerebral atrophy, and there is ex vacuo dilatation of the right frontal horn. There is no evidence of acute infarct, midline shift, or extra-axial fluid collection. Vascular: Major intracranial vascular flow voids are preserved. Skull and upper cervical spine: Bilateral burr holes related to prior subdural hematoma evacuation. No suspicious marrow lesion. Sinuses/Orbits: Bilateral cataract extraction. Small bilateral maxillary sinus mucous retention cysts. Small bilateral mastoid effusions. Other: None. IMPRESSION: 1. Multiple brain masses, many of which are hemorrhagic and now clearly demonstrate enhancement. These are consistent with metastases. 2. Up to moderate associated vasogenic edema.  No midline shift. 3. Small volume intraventricular and subarachnoid hemorrhage. Electronically Signed   By: Logan Bores M.D.   On: 01/23/2018 17:51   Mr Abdomen W JY Contrast  Result Date: 01/26/2018 CLINICAL DATA:  Inpatient. Multiple enhancing hemorrhagic bilateral cerebral masses compatible with metastatic disease of uncertain primary. Indeterminate left adrenal mass, left renal mass and cystic pancreatic tail mass on recent CT abdomen study. EXAM: MRI ABDOMEN WITHOUT AND WITH CONTRAST TECHNIQUE: Multiplanar multisequence MR imaging of the abdomen was performed both before and after the administration of intravenous contrast. CONTRAST:  10 cc Gadavist IV. COMPARISON:  01/24/2018 CT chest, abdomen and pelvis. FINDINGS: Significantly motion degraded scan, limiting assessment. Lower chest: Enlarged 1.4 cm left lower posterior mediastinal para-aortic node (series 5/image 4), unchanged from 01/24/2018 CT. Hepatobiliary: Normal liver size and configuration. Mild diffuse hepatic steatosis. No liver mass. Normal  gallbladder with no cholelithiasis. No biliary ductal dilatation. Common bile duct diameter 3 mm. No choledocholithiasis. Pancreas: There is a unilocular cystic pancreatic  lesion measuring 2.4 x 1.8 x 2.0 cm arising exophytically from the anterior pancreatic tail (series 5/image 12), which demonstrates mild wall thickening and enhancement along the posterior margin, with no internal septations. Subcentimeter simple unilocular nonenhancing pancreatic body cystic lesion. No pancreatic duct dilation. No pancreas divisum. Spleen: Normal size. No mass. Adrenals/Urinary Tract: Normal right adrenal. Left adrenal 4.6 x 3.3 cm mass (series 8/image 18) demonstrates mildly heterogeneous signal intensity on T1 and T2 weighted sequences with heterogeneous enhancement and absence of consistent loss of signal intensity on out of phase chemical shift imaging. No hydronephrosis. Several simple renal cysts in both kidneys, largest 2.7 cm in the interpolar left kidney. There is a 1.4 x 1.1 cm mildly T2 hyperintense and T1 isointense renal cortical lesion in the anterior interpolar left kidney (series 8/image 22) without convincing enhancement on the limited motion degraded postcontrast sequences, probably a Bosniak category 2 hemorrhagic/proteinaceous renal cyst. No overtly suspicious renal masses. Stomach/Bowel: Normal non-distended stomach. Visualized small and large bowel is normal caliber, with no bowel wall thickening. Vascular/Lymphatic: Normal caliber abdominal aorta. Patent portal, splenic, hepatic and renal veins. No pathologically enlarged lymph nodes in the abdomen. Other: No abdominal ascites or focal fluid collection. Musculoskeletal: No aggressive appearing focal osseous lesions. IMPRESSION: 1. Limited motion degraded scan. 2. Heterogeneous enhancing 4.6 cm left adrenal mass remains indeterminate by MRI, with no convincing intralesional lipid. Left adrenal metastasis cannot be excluded. Tissue sampling may be considered. 3. No overtly suspicious renal masses. Probable 1.4 cm Bosniak category 2 hemorrhagic/proteinaceous renal cyst in the anterior interpolar left kidney, for which follow-up  MRI abdomen without and with IV contrast is recommended in 6-12 months given the limitations of this motion degraded scan. 4. 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 No pancreatic duct dilation. GI consultation for consideration of EUS/FNA is indicated. This recommendation follows ACR consensus guidelines: Management of Incidental Pancreatic Cysts: A White Paper of the ACR Incidental Findings Committee. [place] 2703;[id number]. 5. Redemonstration of nonspecific mild left lower mediastinal adenopathy. 6. Mild diffuse hepatic steatosis. Electronically Signed   By: Ilona Sorrel M.D.   On: 01/26/2018 18:09   Ct Abdomen Pelvis W Contrast  Result Date: 01/24/2018 CLINICAL DATA:  Evaluate for metastatic disease. Initial cancer workup. EXAM: CT CHEST, ABDOMEN, AND PELVIS WITH CONTRAST TECHNIQUE: Multidetector CT imaging of the chest, abdomen and pelvis was performed following the standard protocol during bolus administration of intravenous contrast. CONTRAST:  150mL OMNIPAQUE IOHEXOL 300 MG/ML  SOLN COMPARISON:  None. FINDINGS: CT CHEST FINDINGS Cardiovascular: Heart size appears within normal limits. No pericardial effusion identified. Aortic atherosclerosis. Calcifications within the LAD and RCA coronary arteries noted. Mediastinum/Nodes: Normal appearance of the thyroid gland. The trachea appears patent and is midline. No axillary or supraclavicular adenopathy. Within the left posterior mediastinum there is a lymph node measuring 1.4 cm, image 37/3. Lungs/Pleura: No pleural effusion, airspace consolidation or atelectasis. Pulmonary nodule within the posterior costophrenic sulcus measures 9 mm, image 124/5. Also within the posterior left costophrenic sulcus is a 8 mm lung nodule, image 115/5. Musculoskeletal: No chest wall mass or suspicious bone lesions identified. CT ABDOMEN PELVIS FINDINGS  Hepatobiliary: No focal liver abnormality is seen. No gallstones, gallbladder wall thickening, or biliary dilatation. Pancreas: Cystic lesion arising from the tail of pancreas  measures 1.9 by 2.2 cm, image 52/3. No inflammation or main duct dilatation. Spleen: Spleen is normal. Adrenals/Urinary Tract: Normal appearance of the right adrenal gland. Left adrenal nodule measures 4.4 by 3.3 by 5.0 cm and measures 46 HU. Normal appearance of the right kidney. Multiple exophytic cysts are noted arising from the upper and lower pole of left kidney. Indeterminate exophytic lesion arising from the anterior cortex of the interpolar left kidney measures 1 cm and 49 HU, image 64/3. No hydronephrosis identified bilaterally. No focal bladder lesion identified. Stomach/Bowel: Stomach appears normal. The small bowel loops have a normal course and caliber. No pathologic dilatation of the colon. Distal colonic diverticula noted without acute inflammation. Vascular/Lymphatic: Aortic atherosclerosis. No aneurysm. No abdominopelvic adenopathy. Reproductive: Prostate is unremarkable. Other: No abdominal wall hernia or abnormality. No abdominopelvic ascites. Musculoskeletal: Spondylosis noted within the lumbar spine. No aggressive lytic or sclerotic bone lesion. IMPRESSION: 1. No definite primary neoplasm identified. Several nonspecific findings are noted within the chest in abdomen including a 5 cm left adrenal mass. This may represent a benign or malignant tumor. Metastatic disease not excluded. More definitive characterization could be obtained with contrast enhanced abdominal MRI. 2. Small hyperdense left kidney lesion measures 1 cm and 49 HU. This may represent either and small enhancing neoplasm or hemorrhagic/proteinaceous cyst. This could also be further characterized with contrast enhanced abdominal MRI. 3. Single enlarged left posterior mediastinal lymph node measuring 1.4 cm. 4. Small exophytic cystic lesion arising from tail of  pancreas measures 2.2 cm. Further characterization with contrast enhanced MR of the abdomen is advised. 5. 2 small nodules noted within the posterior costophrenic sulcus within the left lower lobe, nonspecific. 6. As an alternative to further imaging with contrast enhanced abdominal MRI a PET-CT may be considered to assess for areas of hypermetabolism which may help guide biopsy of any suspicious hypermetabolic lesions. Electronically Signed   By: Kerby Moors M.D.   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 Please perform CT-guided left adrenal gland mass biopsy for tissue diagnostic purposes. EXAM: CT-GUIDED BIOPSY INDETERMINATE LEFT ADRENAL GLAND MASS COMPARISON:  CT of the chest, abdomen and pelvis - 01/24/2018; abdominal MRI-01/26/2018 MEDICATIONS: None. ANESTHESIA/SEDATION: Fentanyl 50 mcg IV; Versed 1 mg IV Sedation time: 15 minutes; The patient was continuously monitored during the procedure by the interventional radiology nurse under my direct supervision. CONTRAST:  None. COMPLICATIONS: None immediate. PROCEDURE: Informed consent was obtained from the patient following an explanation of the procedure, risks, benefits and alternatives. A time out was performed prior to the initiation of the procedure. The patient was positioned prone on the CT table and a limited CT was performed for procedural planning demonstrating unchanged size and appearance of the at least 4.0 x 3.2 cm left adrenal gland mass (image 42, series 2). The procedure was planned. The operative site was prepped and draped in the usual sterile fashion. Appropriate trajectory was confirmed with a 22 gauge spinal needle after the adjacent tissues were anesthetized with 1% Lidocaine with epinephrine. Under intermittent CT guidance, a 17 gauge coaxial needle was advanced into the peripheral aspect of the mass. Appropriate  positioning was confirmed and 4 core needle biopsy samples were obtained with an 18 gauge core needle biopsy device. The co-axial needle was removed following the administration of a Gel-Foam slurry and superficial hemostasis was achieved with manual compression. A limited postprocedural CT was negative for significant  peri biopsy hemorrhage or additional complication. A dressing was placed. The patient tolerated the procedure well without immediate postprocedural complication. IMPRESSION: Technically successful CT guided core needle biopsy of indeterminate left adrenal gland mass. Electronically Signed   By: Sandi Mariscal M.D.   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  I had a lengthy discussion with the patient after reviewing their MRI results with them. We spoke about whole brain radiotherapy versus stereotactic radiosurgery to the brain. We spoke about the differing risks benefits and side effects of both of these treatments. During [name] of our discussion, we spoke about the hair loss, fatigue and cognitive effects that can result from whole brain radiotherapy.  Additionally, we spoke about radionecrosis / brain injury that can result from stereotactic radiosurgery. I explained that whole brain radiotherapy is more comprehensive and therefore can decrease the chance of recurrences elsewhere in the brain, while stereotactic radiosurgery only treats the areas of gross disease while sparing the rest of the brain parenchyma.  After lengthy discussion, the patient would like to proceed with stereotactic brain radiosurgery to their metastatic disease. They will meet with neurosurgery in the near future to discuss this further; a neurosurgeon will participate in their case. Consent signed today.  Discussed with the patient and his family today that if he would like Occupational Therapy to stop by the home, then I will be  more than happy to place a referral. Today, the family notes that they would like to wait for the referral.   CT simulation will take place on today, 02/11/2018 and treatment on 02/14/2018. I plan to deliver 27 Gy in 3 fractions to the largest tumor in the right frontal lobe. The remaining lesions will be treated in 1 fraction, if the lesion is greater than 2 cm it will get 18 Gy and if less than 2 cm it will get 20 Gy.      __________________________________________   Eppie Gibson, MD  This document serves as a record of services personally performed by Eppie Gibson, MD. It was created on her behalf by Steva Colder, a trained medical scribe. The creation of this record is based on the scribe's personal observations and the provider's statements to them. This document has been checked and approved by the attending provider.

## 2018-02-12 ENCOUNTER — Telehealth: Payer: Self-pay | Admitting: Hematology

## 2018-02-12 ENCOUNTER — Other Ambulatory Visit: Payer: Self-pay

## 2018-02-12 ENCOUNTER — Inpatient Hospital Stay: Payer: PPO

## 2018-02-12 ENCOUNTER — Inpatient Hospital Stay: Payer: PPO | Attending: Hematology | Admitting: Hematology

## 2018-02-12 VITALS — BP 147/80 | HR 76 | Temp 97.8°F | Resp 18 | Ht 69.0 in | Wt 264.8 lb

## 2018-02-12 DIAGNOSIS — E278 Other specified disorders of adrenal gland: Secondary | ICD-10-CM | POA: Diagnosis not present

## 2018-02-12 DIAGNOSIS — Z79899 Other long term (current) drug therapy: Secondary | ICD-10-CM | POA: Diagnosis not present

## 2018-02-12 DIAGNOSIS — I4891 Unspecified atrial fibrillation: Secondary | ICD-10-CM | POA: Insufficient documentation

## 2018-02-12 DIAGNOSIS — C439 Malignant melanoma of skin, unspecified: Secondary | ICD-10-CM | POA: Diagnosis not present

## 2018-02-12 DIAGNOSIS — C7931 Secondary malignant neoplasm of brain: Secondary | ICD-10-CM | POA: Diagnosis not present

## 2018-02-12 DIAGNOSIS — I10 Essential (primary) hypertension: Secondary | ICD-10-CM | POA: Insufficient documentation

## 2018-02-12 DIAGNOSIS — E785 Hyperlipidemia, unspecified: Secondary | ICD-10-CM | POA: Insufficient documentation

## 2018-02-12 DIAGNOSIS — C799 Secondary malignant neoplasm of unspecified site: Secondary | ICD-10-CM

## 2018-02-12 DIAGNOSIS — E039 Hypothyroidism, unspecified: Secondary | ICD-10-CM | POA: Insufficient documentation

## 2018-02-12 LAB — URINALYSIS, COMPLETE (UACMP) WITH MICROSCOPIC
BILIRUBIN URINE: NEGATIVE
Glucose, UA: NEGATIVE mg/dL
Hgb urine dipstick: NEGATIVE
Ketones, ur: NEGATIVE mg/dL
Nitrite: POSITIVE — AB
Protein, ur: >=300 mg/dL — AB
Specific Gravity, Urine: 1.022 (ref 1.005–1.030)
pH: 8 (ref 5.0–8.0)

## 2018-02-12 LAB — CBC WITH DIFFERENTIAL/PLATELET
ABS IMMATURE GRANULOCYTES: 0.04 10*3/uL (ref 0.00–0.07)
BASOS ABS: 0 10*3/uL (ref 0.0–0.1)
Basophils Relative: 0 %
Eosinophils Absolute: 0 10*3/uL (ref 0.0–0.5)
Eosinophils Relative: 0 %
HCT: 46.6 % (ref 39.0–52.0)
Hemoglobin: 15.5 g/dL (ref 13.0–17.0)
Immature Granulocytes: 0 %
Lymphocytes Relative: 10 %
Lymphs Abs: 1.3 10*3/uL (ref 0.7–4.0)
MCH: 29.5 pg (ref 26.0–34.0)
MCHC: 33.3 g/dL (ref 30.0–36.0)
MCV: 88.6 fL (ref 80.0–100.0)
Monocytes Absolute: 0.4 10*3/uL (ref 0.1–1.0)
Monocytes Relative: 3 %
NEUTROS ABS: 11.2 10*3/uL — AB (ref 1.7–7.7)
Neutrophils Relative %: 87 %
Platelets: 154 10*3/uL (ref 150–400)
RBC: 5.26 MIL/uL (ref 4.22–5.81)
RDW: 13.6 % (ref 11.5–15.5)
WBC: 12.9 10*3/uL — ABNORMAL HIGH (ref 4.0–10.5)
nRBC: 0 % (ref 0.0–0.2)

## 2018-02-12 LAB — CMP (CANCER CENTER ONLY)
ALT: 34 U/L (ref 0–44)
AST: 17 U/L (ref 15–41)
Albumin: 3.7 g/dL (ref 3.5–5.0)
Alkaline Phosphatase: 65 U/L (ref 38–126)
Anion gap: 10 (ref 5–15)
BUN: 18 mg/dL (ref 8–23)
CO2: 25 mmol/L (ref 22–32)
Calcium: 9.2 mg/dL (ref 8.9–10.3)
Chloride: 101 mmol/L (ref 98–111)
Creatinine: 1.01 mg/dL (ref 0.61–1.24)
GFR, Est AFR Am: >60 mL/min (ref >60)
GFR, Estimated: >60 mL/min (ref >60)
Glucose, Bld: 84 mg/dL (ref 70–99)
Potassium: 3.9 mmol/L (ref 3.5–5.1)
SODIUM: 136 mmol/L (ref 135–145)
Total Bilirubin: 0.7 mg/dL (ref 0.3–1.2)
Total Protein: 7.3 g/dL (ref 6.5–8.1)

## 2018-02-12 LAB — LACTATE DEHYDROGENASE: LDH: 302 U/L — ABNORMAL HIGH (ref 98–192)

## 2018-02-12 NOTE — Patient Outreach (Signed)
[facility]) Care Management  02/12/2018  [name] [name] 04-02-1940 [id number]    EMMI-STROKE RED ON EMMI ALERT Day # 1 Date: 02/11/18 Red Alert Reason: " Feeling worse overall? Yes"  Outreach attempt # 1 to patient. Spoke with spouse(DPR on file). She voices that patient is currently in the shower and getting dressed as he has MD appt today. RN CM reviewed and addressed red alert with spouse. She states that she has answered the automated call. She reports that patient has been feeling fine since returning home. She reports that he woke up  this morning and complained of a little mild headache but otherwise is feeling fine. She states that patient has been sleeping more and thinks it is just from being tired from all he has been through. She states that patient went for oncology appt on yesterday and her child took him as she was unable to go. She will be accompanying patient to appt today. RN CM confirmed that patient has all his meds in the home. She denies any issues or concerns managing and/or affording them. She states she will talk to MD today to see if he should continue taking his Metformin as she was told it was not advised while he is getting radiation treatments. No issues with transportation. She denies any fu her RN CM needs or concerns at this time. Advised spouse that they would continue to get automated EMMI-Stroke post discharge calls to assess how they are doing following recent hospitalization and will receive a call from a nurse if any of their responses were abnormal. She voiced understanding and was appreciative of f/u call.    Plan: RN CM will close case as no further interventions needed at this time.   [name], RN,BSN,CCM [facility] Telephonic Care Management Coordinator Direct Phone: [phone] Toll Free: [phone] Fax: [phone]

## 2018-02-12 NOTE — Telephone Encounter (Signed)
Printed calendar and avs. °

## 2018-02-13 ENCOUNTER — Encounter (HOSPITAL_COMMUNITY): Payer: Self-pay

## 2018-02-13 ENCOUNTER — Emergency Department (HOSPITAL_COMMUNITY): Payer: PPO

## 2018-02-13 ENCOUNTER — Observation Stay (HOSPITAL_COMMUNITY)
Admission: EM | Admit: 2018-02-13 | Discharge: 2018-02-16 | Disposition: A | Discharge status: Home Health | Payer: PPO | Attending: Internal Medicine | Admitting: Internal Medicine

## 2018-02-13 ENCOUNTER — Ambulatory Visit: Payer: PPO

## 2018-02-13 ENCOUNTER — Other Ambulatory Visit: Payer: Self-pay

## 2018-02-13 DIAGNOSIS — E039 Hypothyroidism, unspecified: Secondary | ICD-10-CM | POA: Diagnosis present

## 2018-02-13 DIAGNOSIS — Z923 Personal history of irradiation: Secondary | ICD-10-CM | POA: Insufficient documentation

## 2018-02-13 DIAGNOSIS — R7303 Prediabetes: Secondary | ICD-10-CM

## 2018-02-13 DIAGNOSIS — I480 Paroxysmal atrial fibrillation: Secondary | ICD-10-CM | POA: Diagnosis present

## 2018-02-13 DIAGNOSIS — R7302 Impaired glucose tolerance (oral): Secondary | ICD-10-CM | POA: Insufficient documentation

## 2018-02-13 DIAGNOSIS — C439 Malignant melanoma of skin, unspecified: Secondary | ICD-10-CM | POA: Diagnosis not present

## 2018-02-13 DIAGNOSIS — N390 Urinary tract infection, site not specified: Secondary | ICD-10-CM | POA: Diagnosis present

## 2018-02-13 DIAGNOSIS — Z79890 Hormone replacement therapy (postmenopausal): Secondary | ICD-10-CM | POA: Insufficient documentation

## 2018-02-13 DIAGNOSIS — I615 Nontraumatic intracerebral hemorrhage, intraventricular: Secondary | ICD-10-CM | POA: Diagnosis not present

## 2018-02-13 DIAGNOSIS — R4182 Altered mental status, unspecified: Secondary | ICD-10-CM

## 2018-02-13 DIAGNOSIS — R2681 Unsteadiness on feet: Secondary | ICD-10-CM | POA: Insufficient documentation

## 2018-02-13 DIAGNOSIS — Z79899 Other long term (current) drug therapy: Secondary | ICD-10-CM | POA: Insufficient documentation

## 2018-02-13 DIAGNOSIS — I619 Nontraumatic intracerebral hemorrhage, unspecified: Secondary | ICD-10-CM | POA: Diagnosis not present

## 2018-02-13 DIAGNOSIS — I10 Essential (primary) hypertension: Secondary | ICD-10-CM | POA: Diagnosis not present

## 2018-02-13 DIAGNOSIS — M6281 Muscle weakness (generalized): Secondary | ICD-10-CM | POA: Insufficient documentation

## 2018-02-13 DIAGNOSIS — Z7984 Long term (current) use of oral hypoglycemic drugs: Secondary | ICD-10-CM | POA: Insufficient documentation

## 2018-02-13 DIAGNOSIS — C7931 Secondary malignant neoplasm of brain: Secondary | ICD-10-CM | POA: Diagnosis present

## 2018-02-13 DIAGNOSIS — G9340 Encephalopathy, unspecified: Principal | ICD-10-CM | POA: Diagnosis present

## 2018-02-13 HISTORY — DX: Malignant melanoma of skin, unspecified: C43.9

## 2018-02-13 LAB — COMPREHENSIVE METABOLIC PANEL
ALT: 44 U/L (ref 0–44)
AST: 29 U/L (ref 15–41)
Albumin: 3.6 g/dL (ref 3.5–5.0)
Alkaline Phosphatase: 55 U/L (ref 38–126)
Anion gap: 16 — ABNORMAL HIGH (ref 5–15)
BUN: 18 mg/dL (ref 8–23)
CO2: 19 mmol/L — AB (ref 22–32)
Calcium: 9 mg/dL (ref 8.9–10.3)
Chloride: 103 mmol/L (ref 98–111)
Creatinine, Ser: 1.23 mg/dL (ref 0.61–1.24)
GFR calc Af Amer: >60 mL/min (ref >60)
GFR calc non Af Amer: 57 mL/min — ABNORMAL LOW (ref >60)
Glucose, Bld: 130 mg/dL — ABNORMAL HIGH (ref 70–99)
Potassium: 4 mmol/L (ref 3.5–5.1)
Sodium: 138 mmol/L (ref 135–145)
Total Bilirubin: 1 mg/dL (ref 0.3–1.2)
Total Protein: 6.8 g/dL (ref 6.5–8.1)

## 2018-02-13 LAB — CBC
HCT: 47.6 % (ref 39.0–52.0)
Hemoglobin: 15.8 g/dL (ref 13.0–17.0)
MCH: 29 pg (ref 26.0–34.0)
MCHC: 33.2 g/dL (ref 30.0–36.0)
MCV: 87.3 fL (ref 80.0–100.0)
Platelets: 134 10*3/uL — ABNORMAL LOW (ref 150–400)
RBC: 5.45 MIL/uL (ref 4.22–5.81)
RDW: 14 % (ref 11.5–15.5)
WBC: 10 10*3/uL (ref 4.0–10.5)
nRBC: 0 % (ref 0.0–0.2)

## 2018-02-13 NOTE — Progress Notes (Signed)
[name] [name] was scheduled to come today for a lab to evaluate his BUN/Cr after his contrast 12/17 due to his metformin prescription. I reviewed his chart and noted that he had labs drawn yesterday that demonstrated normal BUN and Cr ordered by another physician. I spoke with Dr. Lisbeth Renshaw in radiation oncology and he agrees that [name] [name] can restart his metformin without coming in for labs today. I called and spoke with his wife and informed her that they do not need to come in today. She verbalized her understanding. She is aware of his SRS radiation treatment scheduled for tomorrow.

## 2018-02-13 NOTE — ED Triage Notes (Signed)
Pt here with new onset AMS that began this afternoon.  [name] [name] that he has gotten to the point where it is hard to walk. A&Ox4 just slow to respond.  Hx of brain tumors and is supposed to start treatment tomorrow at the cancer center.

## 2018-02-14 ENCOUNTER — Ambulatory Visit
Admission: RE | Admit: 2018-02-14 | Discharge: 2018-02-14 | Disposition: A | Discharge status: Home / Self Care | Payer: PPO | Source: Ambulatory Visit | Attending: Radiation Oncology | Admitting: Radiation Oncology

## 2018-02-14 ENCOUNTER — Encounter (HOSPITAL_COMMUNITY): Payer: Self-pay | Admitting: Internal Medicine

## 2018-02-14 ENCOUNTER — Telehealth: Payer: Self-pay | Admitting: Radiation Therapy

## 2018-02-14 DIAGNOSIS — N390 Urinary tract infection, site not specified: Secondary | ICD-10-CM

## 2018-02-14 DIAGNOSIS — C439 Malignant melanoma of skin, unspecified: Secondary | ICD-10-CM | POA: Diagnosis not present

## 2018-02-14 DIAGNOSIS — Z510 Encounter for antineoplastic radiation therapy: Secondary | ICD-10-CM | POA: Diagnosis not present

## 2018-02-14 DIAGNOSIS — G9340 Encephalopathy, unspecified: Secondary | ICD-10-CM | POA: Diagnosis not present

## 2018-02-14 DIAGNOSIS — C7931 Secondary malignant neoplasm of brain: Secondary | ICD-10-CM

## 2018-02-14 LAB — MAGNESIUM: Magnesium: 2 mg/dL (ref 1.7–2.4)

## 2018-02-14 LAB — CBC WITH DIFFERENTIAL/PLATELET
Abs Immature Granulocytes: 0.06 10*3/uL (ref 0.00–0.07)
Basophils Absolute: 0 10*3/uL (ref 0.0–0.1)
Basophils Relative: 0 %
Eosinophils Absolute: 0 10*3/uL (ref 0.0–0.5)
Eosinophils Relative: 0 %
HCT: 43.8 % (ref 39.0–52.0)
Hemoglobin: 14.5 g/dL (ref 13.0–17.0)
Immature Granulocytes: 1 %
Lymphocytes Relative: 6 %
Lymphs Abs: 0.7 10*3/uL (ref 0.7–4.0)
MCH: 29.1 pg (ref 26.0–34.0)
MCHC: 33.1 g/dL (ref 30.0–36.0)
MCV: 88 fL (ref 80.0–100.0)
MONO ABS: 0.5 10*3/uL (ref 0.1–1.0)
MONOS PCT: 4 %
Neutro Abs: 10.1 10*3/uL — ABNORMAL HIGH (ref 1.7–7.7)
Neutrophils Relative %: 89 %
Platelets: 133 10*3/uL — ABNORMAL LOW (ref 150–400)
RBC: 4.98 MIL/uL (ref 4.22–5.81)
RDW: 13.9 % (ref 11.5–15.5)
WBC: 11.3 10*3/uL — ABNORMAL HIGH (ref 4.0–10.5)
nRBC: 0 % (ref 0.0–0.2)

## 2018-02-14 LAB — URINALYSIS, ROUTINE W REFLEX MICROSCOPIC
Glucose, UA: NEGATIVE mg/dL
Ketones, ur: NEGATIVE mg/dL
Nitrite: POSITIVE — AB
Protein, ur: >300 mg/dL — AB
pH: >9 — ABNORMAL HIGH (ref 5.0–8.0)

## 2018-02-14 LAB — GLUCOSE, CAPILLARY
Glucose-Capillary: 146 mg/dL — ABNORMAL HIGH (ref 70–99)
Glucose-Capillary: 92 mg/dL (ref 70–99)
Glucose-Capillary: 105 mg/dL — ABNORMAL HIGH (ref 70–99)

## 2018-02-14 LAB — CBG MONITORING, ED: Glucose-Capillary: 144 mg/dL — ABNORMAL HIGH (ref 70–99)

## 2018-02-14 LAB — URINE CULTURE: Culture: >=100000 — AB

## 2018-02-14 LAB — URINALYSIS, MICROSCOPIC (REFLEX): WBC, UA: >50 WBC/hpf (ref 0–5)

## 2018-02-14 LAB — BASIC METABOLIC PANEL
Anion gap: 13 (ref 5–15)
BUN: 17 mg/dL (ref 8–23)
CO2: 21 mmol/L — AB (ref 22–32)
Calcium: 8.5 mg/dL — ABNORMAL LOW (ref 8.9–10.3)
Chloride: 102 mmol/L (ref 98–111)
Creatinine, Ser: 1.3 mg/dL — ABNORMAL HIGH (ref 0.61–1.24)
GFR calc Af Amer: >60 mL/min (ref >60)
GFR calc non Af Amer: 53 mL/min — ABNORMAL LOW (ref >60)
Glucose, Bld: 189 mg/dL — ABNORMAL HIGH (ref 70–99)
Potassium: 4.1 mmol/L (ref 3.5–5.1)
Sodium: 136 mmol/L (ref 135–145)

## 2018-02-14 LAB — TSH: TSH: 1.138 u[IU]/mL (ref 0.350–4.500)

## 2018-02-14 LAB — HEPATIC FUNCTION PANEL
ALT: 39 U/L (ref 0–44)
AST: 25 U/L (ref 15–41)
Albumin: 3.1 g/dL — ABNORMAL LOW (ref 3.5–5.0)
Alkaline Phosphatase: 47 U/L (ref 38–126)
BILIRUBIN DIRECT: 0.2 mg/dL (ref 0.0–0.2)
BILIRUBIN TOTAL: 0.8 mg/dL (ref 0.3–1.2)
Indirect Bilirubin: 0.6 mg/dL (ref 0.3–0.9)
Total Protein: 6.2 g/dL — ABNORMAL LOW (ref 6.5–8.1)

## 2018-02-14 MED ORDER — ACETAMINOPHEN 650 MG RE SUPP: 650.0000 mg | Freq: Four times a day (QID) | RECTAL | Status: DC | PRN

## 2018-02-14 MED ORDER — LEVOTHYROXINE SODIUM 100 MCG PO TABS
100.0000 ug | ORAL_TABLET | Freq: Every day | ORAL | Status: DC
  Administered 2018-02-14 – 2018-02-16 (×3): 100 ug via ORAL
  Filled 2018-02-14 (×4): qty 1

## 2018-02-14 MED ORDER — SODIUM CHLORIDE 0.9 % IV SOLN: 1.0000 g | INTRAVENOUS | Status: DC

## 2018-02-14 MED ORDER — ACETAMINOPHEN 325 MG PO TABS: 650.0000 mg | ORAL_TABLET | Freq: Four times a day (QID) | ORAL | Status: DC | PRN

## 2018-02-14 MED ORDER — ONDANSETRON HCL 4 MG PO TABS: 4.0000 mg | ORAL_TABLET | Freq: Four times a day (QID) | ORAL | Status: DC | PRN

## 2018-02-14 MED ORDER — INSULIN ASPART 100 UNIT/ML ~~LOC~~ SOLN: 0.0000 [IU] | Freq: Three times a day (TID) | SUBCUTANEOUS | Status: DC

## 2018-02-14 MED ORDER — SODIUM CHLORIDE 0.9 % IV SOLN
1.0000 g | Freq: Once | INTRAVENOUS | Status: AC
  Administered 2018-02-14: 1 g via INTRAVENOUS
  Filled 2018-02-14: qty 10

## 2018-02-14 MED ORDER — ATORVASTATIN CALCIUM 10 MG PO TABS
10.0000 mg | ORAL_TABLET | Freq: Every day | ORAL | Status: DC
  Administered 2018-02-14 – 2018-02-16 (×3): 10 mg via ORAL
  Filled 2018-02-14 (×3): qty 1

## 2018-02-14 MED ORDER — PIPERACILLIN-TAZOBACTAM 3.375 G IVPB
3.3750 g | Freq: Three times a day (TID) | INTRAVENOUS | Status: DC
  Administered 2018-02-14 – 2018-02-16 (×6): 3.375 g via INTRAVENOUS
  Filled 2018-02-14 (×10): qty 50

## 2018-02-14 MED ORDER — ALBUTEROL SULFATE (2.5 MG/3ML) 0.083% IN NEBU: 3.0000 mL | INHALATION_SOLUTION | Freq: Four times a day (QID) | RESPIRATORY_TRACT | Status: DC | PRN

## 2018-02-14 MED ORDER — DEXAMETHASONE 4 MG PO TABS
4.0000 mg | ORAL_TABLET | Freq: Every day | ORAL | Status: DC
  Administered 2018-02-14 – 2018-02-16 (×3): 4 mg via ORAL
  Filled 2018-02-14 (×3): qty 1

## 2018-02-14 MED ORDER — ONDANSETRON HCL 4 MG/2ML IJ SOLN: 4.0000 mg | Freq: Four times a day (QID) | INTRAMUSCULAR | Status: DC | PRN

## 2018-02-14 MED ORDER — FLECAINIDE ACETATE 100 MG PO TABS
100.0000 mg | ORAL_TABLET | Freq: Two times a day (BID) | ORAL | Status: DC
  Administered 2018-02-14 – 2018-02-16 (×5): 100 mg via ORAL
  Filled 2018-02-14 (×5): qty 1

## 2018-02-14 MED ORDER — DEXAMETHASONE 4 MG PO TABS
10.0000 mg | ORAL_TABLET | Freq: Once | ORAL | Status: AC
  Administered 2018-02-14: 10 mg via ORAL
  Filled 2018-02-14: qty 3

## 2018-02-14 NOTE — Progress Notes (Signed)
Patient transferred to [facility] with all his belongings. Patient's spouse Mikias Lanz notified about patient transfer via phone call. Patient denies any discomfort at the time of discharge from unit. Marcille Blanco, RN

## 2018-02-14 NOTE — ED Notes (Signed)
Attempted to call report to floor 

## 2018-02-14 NOTE — Progress Notes (Signed)
  Radiation Oncology         [phone]) [phone] ________________________________  Inpatient  Stereotactic Treatment Procedure Note  Name: [name] [name] MRN: [id number]  Date: 02/14/2018  DOB: May 31, 1940  SPECIAL TREATMENT PROCEDURE  3D TREATMENT PLANNING AND DOSIMETRY:  The patient's radiation plan was reviewed and approved by neurosurgery and radiation oncology prior to treatment.  It showed 3-dimensional radiation distributions overlaid onto the planning CT/MRI image set.  The [facility] for the target structures as well as the organs at risk were reviewed. The documentation of the 3D plan and dosimetry are filed in the radiation oncology EMR.  NARRATIVE:  [name] [name] was brought to the TrueBeam stereotactic radiation treatment machine and placed supine on the CT couch. The head frame was applied, and the patient was set up for stereotactic radiosurgery.  Neurosurgery was present for the set-up and delivery  SIMULATION VERIFICATION:  In the couch zero-angle position, the patient underwent Exactrac imaging using the Brainlab system with orthogonal KV images.  These were carefully aligned and repeated to confirm treatment position for each of the isocenters.  The Exactrac snap film verification was repeated at each couch angle.  SPECIAL TREATMENT PROCEDURE: [name] [name] received stereotactic radiosurgery to the following targets with 4 vmat fields using 6 MV FFF photons: Max dose=131.6% PTV1 Rt Front 45mm 9Gy (1st of 3 fractions), PTV2 LMid Frontal Gyrus 1mm 18 Gy, PTV3 Rt Inf Frontal Gyrus 57mm 20 Gy, PTV4 Lt Ant Inf Frontal Gyrus 20mm 20 Gy  This constitutes a special treatment procedure due to the ablative dose delivered and the technical nature of treatment.  This highly technical modality of treatment ensures that the ablative dose is centered on the patient's tumor while sparing normal tissues from excessive dose and risk of detrimental effects.  STEREOTACTIC TREATMENT MANAGEMENT:   Following delivery, the patient was transported to nursing in stable condition and monitored for possible acute effects.   The patient tolerated treatment without significant acute effects, and was brought back to the inpatient unit in stable condition.  PLAN: Follow-up in 3 days for second fraction to 57mm lesion.  ________________________________   Eppie Gibson, MD

## 2018-02-14 NOTE — ED Provider Notes (Signed)
[facility] EMERGENCY DEPARTMENT Provider Note   CSN: [id number] Arrival date & time: 02/13/18  2247     History   Chief Complaint Chief Complaint  Patient presents with  . Altered Mental Status    HPI [name] [name] is a 77 y.o. male.  The history is provided by the patient.  Altered Mental Status    He has history of diabetes, hypertension, atrial fibrillation, metastatic melanoma and comes in because of altered mental status today.  Family has noted that he has been confused and seems to have some difficulty talking and also has not been able to walk today.  He was able to speak and walk normally yesterday.  He is scheduled for first cranial radiation treatment tomorrow.  He denies headache, fever, chills, sweats.  Past Medical History:  Diagnosis Date  . Arthritis   . Atrial fibrillation ([place])    previously on Coumadin, reversed with head bleed  . Cataract   . Diabetes mellitus without complication ([place])   . Hyperlipidemia   . HYPERTENSION   . HYPOTHYROIDISM   . 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 Pre-diabetes   . Precancerous skin lesion    on back  . ROTATOR CUFF TEAR   . Subdural hematoma [facility])     Patient Active Problem List   Diagnosis Date Noted  . Brain metastases ([place])   . Lesion of adrenal gland ([place])   . Precancerous skin lesion   . ICH (intracerebral hemorrhage) ([place]) 01/23/2018  . Warfarin anticoagulation   . H/O subdural hemorrhage   . History of burr hole surgery   . Hyponatremia 02/05/2017  . Somnolence 02/05/2017  . Headache 01/28/2017  . 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 Pre-diabetes 09/14/2015  . Paroxysmal atrial fibrillation ([place]) 03/30/2015  . HLD (hyperlipidemia) 03/02/2015  . Hypothyroidism 06/26/2008  . Essential hypertension 06/26/2008  . ROTATOR CUFF TEAR 06/26/2008    Past Surgical History:  Procedure Laterality Date  . APPENDECTOMY    . CATARACT EXTRACTION W/PHACO Right  02/02/2014   Procedure: CATARACT EXTRACTION PHACO AND INTRAOCULAR LENS PLACEMENT (IOC);  Surgeon: Elta Guadeloupe T. Gershon Crane, MD;  Location: [facility];  Service: Ophthalmology;  Laterality: Right;  CDE 12.17  . CATARACT EXTRACTION W/PHACO Left 02/16/2014   Procedure: CATARACT EXTRACTION PHACO AND INTRAOCULAR LENS PLACEMENT ;  Surgeon: Elta Guadeloupe T. Gershon Crane, MD;  Location: [facility];  Service: Ophthalmology;  Laterality: Left;  CDE:13.42  . CRANIOTOMY N/A 02/05/2017   Procedure: Bilateral Burr Holes . Craniotomy for Subdural;  Surgeon: Ditty, Kevan Ny, MD;  Location: [facility];  Service: Neurosurgery;  Laterality: N/A;  . EYE SURGERY  2015   cataract surgery. bilateral  . JOINT REPLACEMENT  2009   bilateral knee replacement  . KNEE SURGERY    . ROTATOR CUFF REPAIR Left   . SHOULDER SURGERY    . TOTAL KNEE ARTHROPLASTY Bilateral         Home Medications    Prior to Admission medications   Medication Sig Start Date End Date Taking? Authorizing Provider  albuterol (PROVENTIL HFA;VENTOLIN HFA) 108 (90 Base) MCG/ACT inhaler Inhale 2 puffs into the lungs every 6 (six) hours as needed for wheezing or shortness of breath. 02/04/18   Dettinger, [name], MD  atorvastatin (LIPITOR) 10 MG tablet Take 1 tablet (10 mg total) by mouth daily. 12/17/17   Chipper Herb, MD  dexamethasone (DECADRON) 4 MG tablet Take 1 tablet (4 mg total)  by mouth daily. 01/30/18   Shelly Coss, MD  flecainide (TAMBOCOR) 100 MG tablet TAKE 1 TABLET (100 MG TOTAL) BY MOUTH 2 (TWO) TIMES DAILY. 02/07/18   Dettinger, [name], MD  levothyroxine (SYNTHROID, LEVOTHROID) 50 MCG tablet TAKE 2 TABLETS BY MOUTH DAILY Patient taking differently: Take 100 mcg by mouth daily before breakfast.  07/29/17   Timmothy Euler, MD  metFORMIN (GLUCOPHAGE-XR) 500 MG 24 hr tablet Take 1 tablet (500 mg total) by mouth daily with breakfast. (Need to be seen) 01/14/18   Dettinger, [name], MD    Family History Family History  Problem Relation Age of Onset  .  Heart attack Brother   . Early death Brother   . Asthma Father   . Vision loss Father   . Hypertension Mother   . Heart attack Brother 57  . Arthritis Brother     Social History Social History   Tobacco Use  . Smoking status: Never Smoker  . Smokeless tobacco: Never Used  Substance Use Topics  . Alcohol use: No    Frequency: Never    Comment: remote use, h/o heavy binge use about 25 years ago  . Drug use: No    Comment: remote h/o marijuana use     Allergies   Patient has no known allergies.   Review of Systems Review of Systems  All other systems reviewed and are negative.    Physical Exam Updated Vital Signs BP (!) 151/97   Pulse (!) 101   Temp 98.3 F (36.8 C) (Oral)   Resp 19   SpO2 99%   Physical Exam Vitals signs and nursing note reviewed.    77 year old male, resting comfortably and in no acute distress. Vital signs are significant for elevated blood pressure and borderline elevated heart rate. Oxygen saturation is 99%, which is normal. Head is normocephalic and atraumatic. PERRLA, EOMI. Oropharynx is clear. Neck is nontender and supple without adenopathy or JVD. Back is nontender and there is no CVA tenderness. Lungs are clear without rales, wheezes, or rhonchi. Chest is nontender. Heart has regular rate and rhythm without murmur. Abdomen is soft, flat, nontender without masses or hepatosplenomegaly and peristalsis is normoactive. Extremities have no cyanosis or edema, full range of motion is present. Skin is warm and dry without rash. Neurologic: Mental status is normal, cranial nerves are intact, there are no motor or sensory deficits.  Gait not tested.  ED Treatments / Results  Labs (all labs ordered are listed, but only abnormal results are displayed) Labs Reviewed  COMPREHENSIVE METABOLIC PANEL - Abnormal; Notable for the following components:      Result Value   CO2 19 (*)    Glucose, Bld 130 (*)    GFR calc non Af Amer 57 (*)    Anion  gap 16 (*)    All other components within normal limits  CBC - Abnormal; Notable for the following components:   Platelets 134 (*)    All other components within normal limits  URINALYSIS, ROUTINE W REFLEX MICROSCOPIC - Abnormal; Notable for the following components:   APPearance TURBID (*)    Specific Gravity, Urine <1.005 (*)    pH >9.0 (*)    Hgb urine dipstick TRACE (*)    Bilirubin Urine SMALL (*)    Protein, ur >300 (*)    Nitrite POSITIVE (*)    Leukocytes, UA LARGE (*)    All other components within normal limits  URINALYSIS, MICROSCOPIC (REFLEX) - Abnormal; Notable for  the following components:   Bacteria, UA MANY (*)    All other components within normal limits  CBG MONITORING, ED - Abnormal; Notable for the following components:   Glucose-Capillary 144 (*)    All other components within normal limits  URINE CULTURE    EKG EKG Interpretation  Date/Time:  Thursday February 13 2018 23:22:12 EST Ventricular Rate:  120 PR Interval:  170 QRS Duration: 104 QT Interval:  310 QTC Calculation: 438 R Axis:   -3 Text Interpretation:  Sinus tachycardia Otherwise normal ECG When compared with ECG of 12/02/2017, HEART RATE has increased Confirmed by Delora Fuel ([id number]) on 02/13/2018 11:30:42 PM   Radiology Ct Head Wo Contrast  Result Date: 02/13/2018 CLINICAL DATA:  Altered LOC EXAM: CT HEAD WITHOUT CONTRAST TECHNIQUE: Contiguous axial images were obtained from the base of the skull through the vertex without intravenous contrast. COMPARISON:  MRI 02/10/2018, CT brain 01/24/2018, 01/22/2018, 11/18/2017 FINDINGS: Brain: Multiple hemorrhagic brain masses, corresponding to history of metastatic melanoma. Right frontal lobe adjacent lesions are increased in size compared to CT 01/24/2018, but grossly unchanged as compared with MRI 02/10/2018. Hyperdense portion of the mass measures 3.6 x 3 cm, previous measurements in November of 3.6 x 2.4 cm. Increased peripheral hemorrhagic  material within the right frontal lobe hyperdense mass as compared with the CT from November. Left parasagittal frontal lobe inferior lesion measures 13 x 11 mm, previously 14 x 12 mm. Left posterior frontal lobe lesion measures 2.7 x 2.6, previously 2.8 x 2.8 cm. Right frontal operculum lesion measuring 15 x 13 mm, previously 16 x 15 mm. Vasogenic edema surrounding the multiple masses, grossly similar as compared with most recent MRI. Decreased hemorrhagic material in the left posterior horn. Slight increased hemorrhagic material in the right posterior horn as compared with prior CT. Probably not significantly changed as compared. Enlarged ventricles are stable in size. There is no midline shift. Vascular: No hyperdense vessels.  Carotid vascular calcification. Skull: Bilateral burr hole and left posterior craniotomy changes. No fracture Sinuses/Orbits: Mucous retention cysts in the maxillary sinuses. Other: None IMPRESSION: 1. Grossly stable size of multiple hemorrhagic brain masses since MRI 02/10/2018 with surrounding edema. Stable enlarged ventricles. No midline shift. No new mass. 2. Similar appearance of small right greater than left intraventricular hemorrhage since MRI 02/10/2018 Electronically Signed   By: Donavan Foil M.D.   On: 02/13/2018 23:59    Procedures Procedures   Medications Ordered in ED Medications  dexamethasone (DECADRON) tablet 10 mg (10 mg Oral Given 02/14/18 0116)  cefTRIAXone (ROCEPHIN) 1 g in sodium chloride 0.9 % 100 mL IVPB (0 g Intravenous Stopped 02/14/18 0208)     Initial Impression / Assessment and Plan / ED Course  I have reviewed the triage vital signs and the nursing notes.  Pertinent labs & imaging results that were available during my care of the patient were reviewed by me and considered in my medical decision making (see chart for details).  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  He has been on dexamethasone at home and is scheduled for first radiation treatment in the morning.  No obvious focal deficits on my exam.  CT obtained tonight showed no change in size of known multiple hemorrhagic brain metastases with some surrounding edema.  Labs show normal glucose, normal creatinine, normal calcium.  Urinalysis obtained yesterday had positive nitrite and WBC clumps.  Suspect altered mentation today is from urinary tract  infection.  Waiting for urinalysis from tonight, but will start on antibiotics.  Urinalysis confirms UTI with positive nitrite and greater than 50 WBCs/hpf, many bacteria.  Case is discussed with Dr. Hal Hope of [facility] hospital[facility], who agrees to admit the patient.  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

## 2018-02-14 NOTE — Telephone Encounter (Signed)
Spoke with Phil from [facility] requesting transportation to the [facility] by 12:45 for Mr. Koeppen brain SRT. After treatment is completed, we will contact patient placement to check and see if his bed is ready here at [facility] for transfer.   Abbe Amsterdam said that he will request a truck to be at [facility] by 12, for a 12:30 delivery of Mr. Pyon.  Mont Dutton R.T.(R)(T) Special Procedures Navigator

## 2018-02-14 NOTE — ED Notes (Signed)
Pt. Sitting on side of bed and attempting to give urine sample.

## 2018-02-14 NOTE — Progress Notes (Signed)
Patient is a very pleasant 77 year old male who was diagnosed with metastatic melanoma with multiple brain metastasis, hypertension, hypothyroidism,atrial fibrillation not on anticoagulation due to intracranial bleeding presented to the emergency department last night because of increasing confusion. Patient also reported to have increased frequency of urination along with dysuria. He was found to have a urinary tract infection and has been started on antibiotics. Patient has an appointment with radiation oncology today for brain  radiation at [facility] at 1 pm. He is being transferred over there today. I have discussed with Dr. Isidore Moos, radiation oncology, and she is aware about the transfer. Patient seen and examined the bedside this morning. He is hemodynamically stable. He is afebrile. Currently he is alert and oriented 4. Denies any specific complaints. Patient seen by Dr. Hal Hope and this morning.Marland Kitchen

## 2018-02-14 NOTE — Progress Notes (Signed)
  Name: [name] [name]  MRN: [id number]  Date: 02/14/2018   DOB: 20-Jan-1941  Stereotactic Radiosurgery Operative Note  PRE-OPERATIVE DIAGNOSIS:  Multiple Brain Metastases  POST-OPERATIVE DIAGNOSIS:  Multiple Brain Metastases  PROCEDURE:  Stereotactic Radiosurgery  SURGEON:  Charlie Pitter, MD  NARRATIVE: The patient underwent a radiation treatment planning session in the radiation oncology simulation suite under the care of the radiation oncology physician and physicist.  I participated closely in the radiation treatment planning afterwards. The patient underwent planning CT which was fused to 3T high resolution MRI with 1 mm axial slices.  These images were fused on the planning system.  We contoured the gross target volumes and subsequently expanded this to yield the Planning Target Volume. I actively participated in the planning process.  I helped to define and review the target contours and also the contours of the optic pathway, eyes, brainstem and selected nearby organs at risk.  All the dose constraints for critical structures were reviewed and compared to AAPM Task Group 101.  The prescription dose conformity was reviewed.  I approved the plan electronically.    Accordingly, [name] [name] was brought to the TrueBeam stereotactic radiation treatment linac and placed in the custom immobilization mask.  The patient was aligned according to the IR fiducial markers with BrainLab Exactrac, then orthogonal x-rays were used in ExacTrac with the 6DOF robotic table and the shifts were made to align the patient  [name] [name] received stereotactic radiosurgery uneventfully.    Lesions treated:  5   Complex lesions treated:  1 (>3.5 cm, <8mm of optic path, or within the brainstem)   The detailed description of the procedure is recorded in the radiation oncology procedure note.  I was present for the duration of the procedure.  DISPOSITION:  Following delivery, the patient was transported to nursing  in stable condition and monitored for possible acute effects to be discharged to home in stable condition with follow-up in one month.  Charlie Pitter, MD 02/14/2018 1:22 PM

## 2018-02-14 NOTE — Progress Notes (Signed)
Report called to [place] at  [facility].  [facility] is here to transfer patient, all necessary paperworks given to care link.

## 2018-02-14 NOTE — Consult Note (Signed)
   [facility] CM Inpatient Consult   02/14/2018  ALTIN SEASE 11-02-1940 [id number]  Came by to speak with patient, had been engaged by Telephonic [facility] for Stroke EMMI calls follow up.  Patient is transferring to [facility] for ongoing Cancer treatment.   Natividad Brood, RN BSN [facility] Liaison  [phone] business mobile phone Toll free office [phone]

## 2018-02-14 NOTE — Progress Notes (Signed)
Patient report given to nurse Josph Macho at [place], RN

## 2018-02-14 NOTE — H&P (Signed)
History and Physical    [name] [name] XHB:[id number] DOB: May 10, 1940 DOA: 02/13/2018  PCP: Dettinger, [name], MD  Patient coming from: Home.  Chief Complaint: Confusion.  HPI: [name] [name] is a 77 y.o. male with history of recently diagnosed multiple brain metastasis with recent biopsy showing metastatic melanoma with history of hypertension atrial fibrillation hypothyroidism and subdural hemorrhage and intracranial bleed off anticoagulation was brought to the ER because of increasing confusion.  Patient's wife who provided the history states that patient was getting confused since yesterday morning and he was trying to avoid seeing from his left eye.  Later in the evening patient found it difficult to walk at that point EMS was called and patient was brought to the ER.  As per patient's wife patient did not lose consciousness or did not have any seizure-like activity.  Did not have any incontinence of urine or tongue bite.  Patient had followed up with radiation oncologist 2 days ago and is scheduled to have radiation of the brain today.  ED Course: In the ER patient appearing slow but was moving all extremities CT head did not show any new changes.  Patient was given Decadron 10 mg IV and UA showing features concerning for UTI was started on ceftriaxone.  At the time of my exam patient has become more alert awake oriented to time place and person moves all extremities and feels better.  Patient admitted for further observation.  Review of Systems: As per HPI, rest all negative.   Past Medical History:  Diagnosis Date  . Arthritis   . Atrial fibrillation ([place])    previously on Coumadin, reversed with head bleed  . Cataract   . Diabetes mellitus without complication ([place])   . Hyperlipidemia   . HYPERTENSION   . HYPOTHYROIDISM   . 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 Pre-diabetes   . Precancerous skin lesion    on back  . ROTATOR CUFF TEAR   .  Subdural hematoma [facility])     Past Surgical History:  Procedure Laterality Date  . APPENDECTOMY    . CATARACT EXTRACTION W/PHACO Right 02/02/2014   Procedure: CATARACT EXTRACTION PHACO AND INTRAOCULAR LENS PLACEMENT (IOC);  Surgeon: Elta Guadeloupe T. Gershon Crane, MD;  Location: [facility];  Service: Ophthalmology;  Laterality: Right;  CDE 12.17  . CATARACT EXTRACTION W/PHACO Left 02/16/2014   Procedure: CATARACT EXTRACTION PHACO AND INTRAOCULAR LENS PLACEMENT ;  Surgeon: Elta Guadeloupe T. Gershon Crane, MD;  Location: [facility];  Service: Ophthalmology;  Laterality: Left;  CDE:13.42  . CRANIOTOMY N/A 02/05/2017   Procedure: Bilateral Burr Holes . Craniotomy for Subdural;  Surgeon: Ditty, Kevan Ny, MD;  Location: [place];  Service: Neurosurgery;  Laterality: N/A;  . EYE SURGERY  2015   cataract surgery. bilateral  . JOINT REPLACEMENT  2009   bilateral knee replacement  . KNEE SURGERY    . ROTATOR CUFF REPAIR Left   . SHOULDER SURGERY    . TOTAL KNEE ARTHROPLASTY Bilateral      reports that he has never smoked. He has never used smokeless tobacco. He reports that he does not drink alcohol or use drugs.  No Known Allergies  Family History  Problem Relation Age of Onset  . Heart attack Brother   . Early death Brother   . Asthma Father   . Vision loss Father   . Hypertension Mother   . Heart attack Brother 32  . Arthritis Brother  Prior to Admission medications   Medication Sig Start Date End Date Taking? Authorizing Provider  albuterol (PROVENTIL HFA;VENTOLIN HFA) 108 (90 Base) MCG/ACT inhaler Inhale 2 puffs into the lungs every 6 (six) hours as needed for wheezing or shortness of breath. 02/04/18  Yes Dettinger, [name], MD  atorvastatin (LIPITOR) 10 MG tablet Take 1 tablet (10 mg total) by mouth daily. 12/17/17  Yes Chipper Herb, MD  dexamethasone (DECADRON) 4 MG tablet Take 1 tablet (4 mg total) by mouth daily. 01/30/18  Yes Shelly Coss, MD  flecainide (TAMBOCOR) 100 MG tablet TAKE 1 TABLET (100 MG  TOTAL) BY MOUTH 2 (TWO) TIMES DAILY. Patient taking differently: Take 100 mg by mouth 2 (two) times daily.  02/07/18  Yes Dettinger, [name], MD  levothyroxine (SYNTHROID, LEVOTHROID) 50 MCG tablet TAKE 2 TABLETS BY MOUTH DAILY Patient taking differently: Take 100 mcg by mouth daily before breakfast.  07/29/17  Yes Timmothy Euler, MD  metFORMIN (GLUCOPHAGE-XR) 500 MG 24 hr tablet Take 1 tablet (500 mg total) by mouth daily with breakfast. (Need to be seen) 01/14/18  Yes Dettinger, [name], MD    Physical Exam: Vitals:   02/14/18 0027 02/14/18 0120 02/14/18 0230 02/14/18 0245  BP: 123/78 110/85 116/76 129/89  Pulse: (!) 110 (!) 109 (!) 106 (!) 102  Resp: 20 16 20 17   Temp: 98.3 F (36.8 C)     TempSrc: Oral     SpO2: 96% 95% 94%   Weight: 119.7 kg     Height: 5\' 10"  (1.778 m)         Constitutional: Moderately built and nourished. Vitals:   02/14/18 0027 02/14/18 0120 02/14/18 0230 02/14/18 0245  BP: 123/78 110/85 116/76 129/89  Pulse: (!) 110 (!) 109 (!) 106 (!) 102  Resp: 20 16 20 17   Temp: 98.3 F (36.8 C)     TempSrc: Oral     SpO2: 96% 95% 94%   Weight: 119.7 kg     Height: 5\' 10"  (1.778 m)      Eyes: Anicteric no pallor. ENMT: No discharge from the ears eyes nose or mouth. Neck: No mass felt.  No neck rigidity. Respiratory: No rhonchi or crepitations. Cardiovascular: S1-S2 heard. Abdomen: Soft nontender bowel sounds present. Musculoskeletal: No edema. Skin: No rash. Neurologic: Alert awake oriented to time place and person.  Moves all extremities 5 x 5. Psychiatric: Appears normal.   Labs on Admission: I have personally reviewed following labs and imaging studies  CBC: Recent Labs  Lab 02/12/18 1354 02/13/18 2309  WBC 12.9* 10.0  NEUTROABS 11.2*  --   HGB 15.5 15.8  HCT 46.6 47.6  MCV 88.6 87.3  PLT 154 295*   Basic Metabolic Panel: Recent Labs  Lab 02/12/18 1354 02/13/18 2309  NA 136 138  K 3.9 4.0  CL 101 103  CO2 25 19*  GLUCOSE 84  130*  BUN 18 18  CREATININE 1.01 1.23  CALCIUM 9.2 9.0   GFR: Estimated Creatinine Clearance: 67.3 mL/min (by C-G formula based on SCr of 1.23 mg/dL). Liver Function Tests: Recent Labs  Lab 02/12/18 1354 02/13/18 2309  AST 17 29  ALT 34 44  ALKPHOS 65 55  BILITOT 0.7 1.0  PROT 7.3 6.8  ALBUMIN 3.7 3.6   No results for input(s): LIPASE, AMYLASE in the last 168 hours. No results for input(s): AMMONIA in the last 168 hours. Coagulation Profile: No results for input(s): INR, PROTIME in the last 168 hours. Cardiac Enzymes: No results  for input(s): CKTOTAL, CKMB, CKMBINDEX, TROPONINI in the last 168 hours. BNP (last 3 results) No results for input(s): PROBNP in the last 8760 hours. HbA1C: No results for input(s): HGBA1C in the last 72 hours. CBG: Recent Labs  Lab 02/14/18 0051  GLUCAP 144*   Lipid Profile: No results for input(s): CHOL, HDL, LDLCALC, TRIG, CHOLHDL, LDLDIRECT in the last 72 hours. Thyroid Function Tests: No results for input(s): TSH, T4TOTAL, FREET4, T3FREE, THYROIDAB in the last 72 hours. Anemia Panel: No results for input(s): VITAMINB12, FOLATE, FERRITIN, TIBC, IRON, RETICCTPCT in the last 72 hours. Urine analysis:    Component Value Date/Time   COLORURINE YELLOW 02/14/2018 0221   APPEARANCEUR TURBID (A) 02/14/2018 0221   LABSPEC <1.005 (L) 02/14/2018 0221   PHURINE >9.0 (H) 02/14/2018 0221   GLUCOSEU NEGATIVE 02/14/2018 0221   HGBUR TRACE (A) 02/14/2018 0221   BILIRUBINUR SMALL (A) 02/14/2018 0221   KETONESUR NEGATIVE 02/14/2018 0221   PROTEINUR >300 (A) 02/14/2018 0221   UROBILINOGEN 1.0 05/11/2010 1145   NITRITE POSITIVE (A) 02/14/2018 0221   LEUKOCYTESUR LARGE (A) 02/14/2018 0221   Sepsis Labs: @LABRCNTIP (procalcitonin:4,lacticidven:4) ) Recent Results (from the past 240 hour(s))  Culture, Urine     Status: Abnormal (Preliminary result)   Collection Time: 02/12/18  1:55 PM  Result Value Ref Range Status   Specimen Description   Final     URINE, CLEAN CATCH Performed at [facility], [place] [address], [place], [place] [address]    Special Requests   Final    NONE Performed at [facility], [place] [address] [place], [place] [address]    Culture (A)  Final    >=100,000 COLONIES/mL PROTEUS MIRABILIS SUSCEPTIBILITIES TO FOLLOW Performed at [facility], [place] [address], [place], [place] [address]    Report Status PENDING  Incomplete     Radiological Exams on Admission: Ct Head Wo Contrast  Result Date: 02/13/2018 CLINICAL DATA:  Altered LOC EXAM: CT HEAD WITHOUT CONTRAST TECHNIQUE: Contiguous axial images were obtained from the base of the skull through the vertex without intravenous contrast. COMPARISON:  MRI 02/10/2018, CT brain 01/24/2018, 01/22/2018, 11/18/2017 FINDINGS: Brain: Multiple hemorrhagic brain masses, corresponding to history of metastatic melanoma. Right frontal lobe adjacent lesions are increased in size compared to CT 01/24/2018, but grossly unchanged as compared with MRI 02/10/2018. Hyperdense portion of the mass measures 3.6 x 3 cm, previous measurements in November of 3.6 x 2.4 cm. Increased peripheral hemorrhagic material within the right frontal lobe hyperdense mass as compared with the CT from November. Left parasagittal frontal lobe inferior lesion measures 13 x 11 mm, previously 14 x 12 mm. Left posterior frontal lobe lesion measures 2.7 x 2.6, previously 2.8 x 2.8 cm. Right frontal operculum lesion measuring 15 x 13 mm, previously 16 x 15 mm. Vasogenic edema surrounding the multiple masses, grossly similar as compared with most recent MRI. Decreased hemorrhagic material in the left posterior horn. Slight increased hemorrhagic material in the right posterior horn as compared with prior CT. Probably not significantly changed as compared. Enlarged ventricles are stable in size. There is no midline shift. Vascular: No hyperdense vessels.  Carotid vascular  calcification. Skull: Bilateral burr hole and left posterior craniotomy changes. No fracture Sinuses/Orbits: Mucous retention cysts in the maxillary sinuses. Other: None IMPRESSION: 1. Grossly stable size of multiple hemorrhagic brain masses since MRI 02/10/2018 with surrounding edema. Stable enlarged ventricles. No midline shift. No new mass. 2. Similar appearance of small right greater than  left intraventricular hemorrhage since MRI 02/10/2018 Electronically Signed   By: Donavan Foil M.D.   On: 02/13/2018 23:59    EKG: Independently reviewed.  Sinus tachycardia  Assessment/Plan Principal Problem:   Acute encephalopathy Active Problems:   Hypothyroidism   Essential hypertension   Paroxysmal atrial fibrillation ([facility])   ICH (intracerebral hemorrhage) (H[facility])   Brain metastases (HCC)   Acute lower UTI    1. Acute encephalopathy with known history of metastatic brain lesions melanoma -improved with 1 dose of Decadron and starting of ceftriaxone and fluids.  Continue to observe. 2. Metastatic brain lesion with melanoma -on Decadron.  Patient is scheduled to have radiation today please contact Dr. Isidore Moos and may need to be transferred to [facility]. 3. A. fib rate controlled on flecainide.  Not on anticoagulation secondary to intracranial bleed. 4. Hypothyroidism on Synthroid. 5. Prediabetics -we will keep patient on sliding scale coverage. 6. Hyperlipidemia on statins. 7. Intracranial bleed and subdural hematoma.   DVT prophylaxis: SCDs. Code Status: Full code. Family Communication: Patient's wife. Disposition Plan: Home. Consults called: None. Admission status: Observation.   Rise Patience MD [facility] Pager [phone].  If 7PM-7AM, please contact night-coverage [email or web address] Password [facility]  02/14/2018, 5:21 AM

## 2018-02-15 DIAGNOSIS — G9340 Encephalopathy, unspecified: Secondary | ICD-10-CM | POA: Diagnosis not present

## 2018-02-15 LAB — GLUCOSE, CAPILLARY
GLUCOSE-CAPILLARY: 102 mg/dL — AB (ref 70–99)
Glucose-Capillary: 97 mg/dL (ref 70–99)
Glucose-Capillary: 120 mg/dL — ABNORMAL HIGH (ref 70–99)
Glucose-Capillary: 104 mg/dL — ABNORMAL HIGH (ref 70–99)

## 2018-02-15 NOTE — Progress Notes (Signed)
Patient Demographics:    [name] [name], is a 77 y.o. male, DOB - [birth year], WEX:[id number]  Admit date - 02/13/2018   Admitting Physician [name] [name]  Outpatient Primary [name] for the patient is Dettinger, [name], [name]  LOS - 0   Chief Complaint  Patient presents with  . Altered Mental Status        Subjective:    [name] today has no fevers, no emesis,  No chest pain, wife at bedside, patient still somewhat confused but improving,  Assessment  & Plan :    Principal Problem:   Acute encephalopathy Active Problems:   Hypothyroidism   Essential hypertension   Paroxysmal atrial fibrillation (HCC)   ICH (intracerebral hemorrhage) (HCC)   Brain metastases ([place])   Acute lower UTI  Brief summary  77 year old male who was diagnosed with metastatic melanoma with multiple brain metastasis, hypertension, hypothyroidism,atrial fibrillation not on anticoagulation due to intracranial bleeding was admitted to [facility] on 02/14/2018 with concerns about acute encephalopathy and UTI, transferred to [facility] on 02/14/2018 in order to allow for radiation treatments  Plan:- 1) acute toxic encephalopathy--- secondary to UTI, according to patient's wife he is not back to baseline but he is improving, more coherent, less confused, no further visual hallucinations, patient still has difficulty processing information and giving appropriate answers, according to patient's wife he still having difficulties with decision-making  2) melanoma with brain mets--- currently on Decadron and undergoing radiation therapy with Dr. Isidore Moos--- this could be contributing to encephalopathy  3) paroxysmal atrial fibrillation--- continue flecainide 100 mg twice daily, rate appears controlled, no for anticoagulation due to prior history of ICH and subdural hematoma  4)Glucose intolerance--anticipate  fluctuation of glucose with Decadron use, continue sliding scale coverage, recent A1c was 6.1  5) hypothyroidism--recent TSH was 1.13, patient clinically euthyroid, continue levothyroxine 100 mcg daily  6)UTI--- suspect causing #1 above, continue IV Zosyn, no fevers, no emesis , anticipate discharge in 1 to 2 days on oral antibiotic as per sensitivity from urine culture results   Disposition/Need for in-Hospital Stay- patient unable to be discharged at this time due to acute toxic metabolic encephalopathy secondary to UTI and brain mass with Decadron use, improving but not back to baseline, anticipate discharge in 1 to 2 days on oral antibiotic as per sensitivity from urine culture results  Code Status : FULL   Disposition Plan  : home with wife  Consults  :  PT/edition oncology   DVT Prophylaxis  :   - SCDs   Lab Results  Component Value Date   PLT 133 (L) 02/14/2018    Inpatient Medications  Scheduled Meds: . atorvastatin  10 mg Oral Daily  . dexamethasone  4 mg Oral Daily  . flecainide  100 mg Oral BID  . insulin aspart  0-9 Units Subcutaneous TID WC  . levothyroxine  100 mcg Oral QAC breakfast   Continuous Infusions: . piperacillin-tazobactam (ZOSYN)  IV 3.375 g (02/15/18 1303)   PRN Meds:.acetaminophen **OR** acetaminophen, albuterol, ondansetron **OR** ondansetron (ZOFRAN) IV    Anti-infectives (From admission, onward)   Start     Dose/Rate Route Frequency Ordered Stop   02/14/18 2200  cefTRIAXone (ROCEPHIN) 1 g in sodium chloride 0.9 %  100 mL IVPB  Status:  Discontinued     1 g 200 mL/hr over 30 Minutes Intravenous Every 24 hours 02/14/18 0520 02/14/18 1037   02/14/18 1200  piperacillin-tazobactam (ZOSYN) IVPB 3.375 g     3.375 g 12.5 mL/hr over 240 Minutes Intravenous Every 8 hours 02/14/18 1037     02/14/18 0100  cefTRIAXone (ROCEPHIN) 1 g in sodium chloride 0.9 % 100 mL IVPB     1 g 200 mL/hr over 30 Minutes Intravenous  Once 02/14/18 0045 02/14/18 0208          Objective:   Vitals:   02/14/18 1524 02/14/18 1958 02/15/18 0428 02/15/18 1426  BP: (!) 147/88 123/71 126/76 130/80  Pulse: 72 86 76 71  Resp: 18 14 12 18   Temp: 97.7 F (36.5 C) 98 F (36.7 C) 98.5 F (36.9 C) 98 F (36.7 C)  TempSrc: Oral Oral Oral Oral  SpO2: 100% 98% 99% 95%  Weight:      Height:        Wt Readings from Last 3 Encounters:  02/14/18 117.9 kg  02/12/18 120.1 kg  02/11/18 120 kg     Intake/Output Summary (Last 24 hours) at 02/15/2018 1922 Last data filed at 02/15/2018 8295 Gross per 24 hour  Intake 1399.38 ml  Output -  Net 1399.38 ml     Physical Exam Patient is examined daily including today on 02/15/18 , exams remain the same as of yesterday except that has changed   Gen:- Awake , slow to give answers, cooperative HEENT:- [place].AT, No sclera icterus Neck-Supple Neck,No JVD,.  Lungs-  CTAB , fair symmetrical air movement CV- S1, S2 normal, regular  Abd-  +ve B.Sounds, Abd Soft, No tenderness,    Extremity/Skin:- No  edema, pedal pulses present  Psych-affect is appropriate, oriented x3 Neuro-generalized weakness but no new focal deficits, no tremors   Data Review:    Radiology Reports Ct Head Wo Contrast  Result Date: 02/13/2018 CLINICAL DATA:  Altered LOC EXAM: CT HEAD WITHOUT CONTRAST TECHNIQUE: Contiguous axial images were obtained from the base of the skull through the vertex without intravenous contrast. COMPARISON:  MRI 02/10/2018, CT brain 01/24/2018, 01/22/2018, 11/18/2017 FINDINGS: Brain: Multiple hemorrhagic brain masses, corresponding to history of metastatic melanoma. Right frontal lobe adjacent lesions are increased in size compared to CT 01/24/2018, but grossly unchanged as compared with MRI 02/10/2018. Hyperdense portion of the mass measures 3.6 x 3 cm, previous measurements in November of 3.6 x 2.4 cm. Increased peripheral hemorrhagic material within the right frontal lobe hyperdense mass as compared with the CT from  November. Left parasagittal frontal lobe inferior lesion measures 13 x 11 mm, previously 14 x 12 mm. Left posterior frontal lobe lesion measures 2.7 x 2.6, previously 2.8 x 2.8 cm. Right frontal operculum lesion measuring 15 x 13 mm, previously 16 x 15 mm. Vasogenic edema surrounding the multiple masses, grossly similar as compared with most recent MRI. Decreased hemorrhagic material in the left posterior horn. Slight increased hemorrhagic material in the right posterior horn as compared with prior CT. Probably not significantly changed as compared. Enlarged ventricles are stable in size. There is no midline shift. Vascular: No hyperdense vessels.  Carotid vascular calcification. Skull: Bilateral burr hole and left posterior craniotomy changes. No fracture Sinuses/Orbits: Mucous retention cysts in the maxillary sinuses. Other: None IMPRESSION: 1. Grossly stable size of multiple hemorrhagic brain masses since MRI 02/10/2018 with surrounding edema. Stable enlarged ventricles. No midline shift. No new mass. 2. Similar appearance of  small right greater than left intraventricular hemorrhage since MRI 02/10/2018 Electronically Signed   By: Donavan Foil M.D.   On: 02/13/2018 23:59   Ct Head Wo Contrast  Result Date: 01/24/2018 CLINICAL DATA:  Follow-up examination for acute stroke. EXAM: CT HEAD WITHOUT CONTRAST TECHNIQUE: Contiguous axial images were obtained from the base of the skull through the vertex without intravenous contrast. COMPARISON:  Previous CT from 01/22/2018 as well as brain MRI from 01/23/2018. FINDINGS: Brain: Multiple hemorrhagic brain masses again seen, relatively stable and unchanged in size as compared to most recent CT. Largest right frontal lobe mass measures 3.6 x 2.4 cm. Dominant left frontal lesion measures 2.7 x 2.8 cm. Mass at the left gyrus rectus measures 1.4 x 1.2 cm. Additional right frontal operculum lower lesion measures 1.5 x 1.5 cm. Surrounding vasogenic edema about several of  these lesions, most notable at the left frontal lesion. No midline shift. Basilar cisterns remain patent. Cystic encephalomalacia at the adjacent right frontal lesion with is small amount of layering hemorrhage again noted. Intraventricular extension with small amount of blood seen layering within the occipital horns, also unchanged. No other new intracranial hemorrhage or mass lesion identified. No acute large vessel territory infarct. No extra-axial fluid collection. Atrophy with chronic microvascular ischemic disease again noted. Vascular: No hyperdense vessel. Scattered vascular calcifications noted within the carotid siphons. Skull: Scalp soft tissues demonstrate no acute finding. Sequelae of prior bilateral burr hole craniotomy. Sinuses/Orbits: Globes orbital soft tissues within normal limits. Patient status post bilateral ocular lens replacement. Paranasal sinuses remain largely clear. Small bilateral maxillary sinus retention cyst noted. Small bilateral mastoid effusions noted. Other: None. IMPRESSION: 1. Relatively stable appearance of multifocal hemorrhagic masses with localized edema as above. No midline shift. 2. Associated small volume intraventricular hemorrhage, relatively unchanged. Stable ventricular size without hydrocephalus. 3. No other new acute intracranial abnormality. Electronically Signed   By: Jeannine Boga M.D.   On: 01/24/2018 02:17   Ct Head Wo Contrast  Result Date: 01/23/2018 CLINICAL DATA:  Initial evaluation for acute headache, recent intracranial hemorrhages. EXAM: CT HEAD WITHOUT CONTRAST TECHNIQUE: Contiguous axial images were obtained from the base of the skull through the vertex without intravenous contrast. COMPARISON:  Prior CT from 12/17/2017. FINDINGS: Brain: Since the previous exam, the right frontal intraparenchymal hemorrhage has enlarged in size now measuring 3.5 x 2.4 x 1.9 cm (series 2, image 25). Localized edema within this region overall slightly  improved. Adjacent cystic encephalomalacia of now contains a small amount of layering hemorrhage (series 2, image 22). Additional left frontal intraparenchymal hematoma also increased in size now measuring 3.1 x 2.5 x 2.5 cm (series 2, image 22, previously 1.8 cm). Localized vasogenic edema slightly worsened from previous. Additional right frontotemporal hemorrhage also increased in size now measuring 1.5 x 1.7 x 1.1 cm (series 2, image 17). Localized vasogenic edema has worsened without significant regional mass effect. Additional soft tissue density at the parasagittal aspect of the anterior inferior left frontal lobe measures 1.5 x 1.2 x 1.3 cm with mild localized edema without midline shift. This may have been present on prior CT, although difficult to see on prior exam. Finding also likely reflects a small intraparenchymal hemorrhage. New small volume intraventricular hemorrhage with blood seen layering within the occipital horns of both lateral ventricles, likely via extension from the right frontal ventricular size is relatively stable without interval hydrocephalus. Basilar cisterns remain patent. No other acute intracranial hemorrhage. No subarachnoid or extra-axial hemorrhage appreciated. No acute large vessel territory  infarct. Underlying cerebral atrophy, stable. Vascular: No hyperdense vessel. Scattered vascular calcifications noted within the carotid siphons. Skull: Scalp soft tissues demonstrate no acute abnormality. Sequelae of prior bilateral burr hole craniotomy. Sinuses/Orbits: Globes and orbital soft tissues within normal limits. Small bilateral maxillary sinus retention cyst noted. Paranasal sinuses are otherwise clear. Small chronic right mastoid effusion noted. Other: None. IMPRESSION: 1. Interval increase in size of bilateral intraparenchymal hematomas as above. Localized vasogenic edema without significant midline shift. 2. New small volume intraventricular hemorrhage, likely via extension  from the right frontal hematoma and adjacent cystic encephalomalacia. Stable ventricular size without hydrocephalus or ventricular trapping at this time. Critical Value/emergent results were called by telephone at the time of interpretation on 01/23/2018 at 12:13 am to Dr. Rolland Porter , who verbally acknowledged these results. Electronically Signed   By: Jeannine Boga M.D.   On: 01/23/2018 00:18   Ct Chest W Contrast  Result Date: 01/24/2018 CLINICAL DATA:  Evaluate for metastatic disease. Initial cancer workup. EXAM: CT CHEST, ABDOMEN, AND PELVIS WITH CONTRAST TECHNIQUE: Multidetector CT imaging of the chest, abdomen and pelvis was performed following the standard protocol during bolus administration of intravenous contrast. CONTRAST:  165mL OMNIPAQUE IOHEXOL 300 MG/ML  SOLN COMPARISON:  None. FINDINGS: CT CHEST FINDINGS Cardiovascular: Heart size appears within normal limits. No pericardial effusion identified. Aortic atherosclerosis. Calcifications within the LAD and RCA coronary arteries noted. Mediastinum/Nodes: Normal appearance of the thyroid gland. The trachea appears patent and is midline. No axillary or supraclavicular adenopathy. Within the left posterior mediastinum there is a lymph node measuring 1.4 cm, image 37/3. Lungs/Pleura: No pleural effusion, airspace consolidation or atelectasis. Pulmonary nodule within the posterior costophrenic sulcus measures 9 mm, image 124/5. Also within the posterior left costophrenic sulcus is a 8 mm lung nodule, image 115/5. Musculoskeletal: No chest wall mass or suspicious bone lesions identified. CT ABDOMEN PELVIS FINDINGS Hepatobiliary: No focal liver abnormality is seen. No gallstones, gallbladder wall thickening, or biliary dilatation. Pancreas: Cystic lesion arising from the tail of pancreas measures 1.9 by 2.2 cm, image 52/3. No inflammation or main duct dilatation. Spleen: Spleen is normal. Adrenals/Urinary Tract: Normal appearance of the right adrenal  gland. Left adrenal nodule measures 4.4 by 3.3 by 5.0 cm and measures 46 HU. Normal appearance of the right kidney. Multiple exophytic cysts are noted arising from the upper and lower pole of left kidney. Indeterminate exophytic lesion arising from the anterior cortex of the interpolar left kidney measures 1 cm and 49 HU, image 64/3. No hydronephrosis identified bilaterally. No focal bladder lesion identified. Stomach/Bowel: Stomach appears normal. The small bowel loops have a normal course and caliber. No pathologic dilatation of the colon. Distal colonic diverticula noted without acute inflammation. Vascular/Lymphatic: Aortic atherosclerosis. No aneurysm. No abdominopelvic adenopathy. Reproductive: Prostate is unremarkable. Other: No abdominal wall hernia or abnormality. No abdominopelvic ascites. Musculoskeletal: Spondylosis noted within the lumbar spine. No aggressive lytic or sclerotic bone lesion. IMPRESSION: 1. No definite primary neoplasm identified. Several nonspecific findings are noted within the chest in abdomen including a 5 cm left adrenal mass. This may represent a benign or malignant tumor. Metastatic disease not excluded. More definitive characterization could be obtained with contrast enhanced abdominal MRI. 2. Small hyperdense left kidney lesion measures 1 cm and 49 HU. This may represent either and small enhancing neoplasm or hemorrhagic/proteinaceous cyst. This could also be further characterized with contrast enhanced abdominal MRI. 3. Single enlarged left posterior mediastinal lymph node measuring 1.4 cm. 4. Small exophytic cystic lesion arising from  tail of pancreas measures 2.2 cm. Further characterization with contrast enhanced MR of the abdomen is advised. 5. 2 small nodules noted within the posterior costophrenic sulcus within the left lower lobe, nonspecific. 6. As an alternative to further imaging with contrast enhanced abdominal MRI a PET-CT may be considered to assess for areas of  hypermetabolism which may help guide biopsy of any suspicious hypermetabolic lesions. Electronically Signed   By: Kerby Moors M.D.   On: 01/24/2018 21:32   Mr Jeri Cos PN Contrast  Result Date: 02/11/2018 CLINICAL DATA:  77 year old male with prior brain hemorrhages. History of atrial fibrillation, not on anticoagulation. Intracranial hemorrhages with follow-up MRI 01/23/2018 demonstrating multiple enhancing brain masses suggesting metastatic disease. Status post CT-guided biopsy of a left adrenal gland mass, pathology revealing Metastatic Melanoma. Treatment planning. EXAM: MRI HEAD WITHOUT AND WITH CONTRAST TECHNIQUE: Multiplanar, multiecho pulse sequences of the brain and surrounding structures were obtained without and with intravenous contrast. CONTRAST:  10 milliliters Gadavist COMPARISON:  01/23/2018. FINDINGS: Study is intermittently degraded by motion artifact despite repeated imaging attempts. Brain: Bilateral frontal lobe hemorrhagic and enhancing masses with only occasional intrinsic T1 hyperintensity. A total of SIX lesions are identified if the right superior frontal lobe adjacent cystic and solid masses are treated as two (versus alternatively five overall if you consider one conglomerate cystic/solid mass measuring up to 4.3 centimeters as on series 13, image 30): - 7 millimeter round enhancing mass just anterior inferior to the right basal ganglia on series 12, image 99. - 19 millimeter right inferior frontal gyrus mass near the operculum on series 12, image 110. - 14 millimeter left anterior inferior frontal gyrus mass on series 12, image 123. - 27 millimeter left middle frontal gyrus mass just above the operculum on image 126. - 37 millimeter largely cystic, rim enhancing mass in the superior right frontal lobe on image 134. - adjacent 38 millimeter solid right superior frontal gyrus mass on image 148. Most of these have mildly increased in size since November (the lesions on images 126 and  134 are relatively stable). Vasogenic edema associated with each of the lesions has not significantly changed since November. Additionally there is a tiny indeterminate area on series 12, image 147 in the superior right parietal lobe which is only identified on the axial images and does not have associated hemosiderin. This is probably artifact. No definite new brain mass identified. Overlying changes of bilateral craniotomy and/or burr hole placement which likely explains areas of mild dural thickening as postoperative in nature. Other areas of intracranial hemorrhage which are presumably benign. Persistent intraventricular hemorrhage layering in both occipital horns and along the right choroid plexus which is not abnormally enhancing. Superimposed superficial siderosis. No midline shift. Basilar cisterns remain patent. No restricted diffusion or evidence of acute infarction. Stable ventricle size. Negative pituitary and cervicomedullary junction. Vascular: Major intracranial vascular flow voids are preserved. Skull and upper cervical spine: Negative visible cervical spine and spinal cord. Postoperative changes to the skull. Visualized bone marrow signal is within normal limits. Sinuses/Orbits: Stable and negative orbits. Small paranasal sinus mucous retention cysts are stable. Other: Mild mastoid effusions have regressed. No suspicious scalp or face soft tissue abnormality. IMPRESSION: 1. 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 No new lesions since November (see #2). All metastases are annotated on series 12. Most demonstrate mild enlargement since November, with the range of 7 to 38 mm diameter.  Associated vasogenic edema has not significantly changed. No significant intracranial mass effect at this time. 2. Probable artifact in the superior right parietal lobe on series 12, image 147. Attention directed on follow-up. 3.  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 Electronically Signed   By: Genevie Ann M.D.   On: 02/11/2018 08:52   Mr Jeri Cos IO Contrast  Result Date: 01/23/2018 CLINICAL DATA:  Headaches.  Intracranial hemorrhages. EXAM: MRI HEAD WITHOUT AND WITH CONTRAST TECHNIQUE: Multiplanar, multiecho pulse sequences of the brain and surrounding structures were obtained without and with intravenous contrast. CONTRAST:  10 mL Gadavist COMPARISON:  Head CT 01/22/2018 and MRI 12/12/2017 FINDINGS: Brain: A hemorrhagic right frontal mass demonstrates solid enhancement and measures 3.2 x 2.7 cm (series 15, image 130). A cystic component or separate cystic lesion/cystic encephalomalacia inferior to the solid mass measures 4.2 x 2.6 cm and demonstrates a small amount of peripheral linear and nodular enhancement (series 15, image 116). A 1.7 cm hemorrhagic right frontal lobe mass anterior to the insula has enlarged from the prior MRI and now clearly enhances (series 15, image 90). A 1.2 cm enhancing mass in the anteromedial left frontal lobe is new or larger compared to the prior MRI (series 15, image 89). A 2.6 x 2.6 cm hemorrhagic lateral left frontal lobe mass has enlarged from the prior MRI and now also clearly has solid enhancing components. A 6 mm ring-enhancing inferior right frontal lesion is new (series 15, image 79). Motion artifact could obscure additional small enhancing lesions. There is moderate vasogenic edema associated with the lateral left frontal mass with milder edema associated with other bilateral frontal masses. As seen on the recent head CT, there is a small amount of blood in the occipital horns of the lateral ventricles. There is also trace subarachnoid hemorrhage posteriorly in the sylvian fissures. There is not a significant chronic microhemorrhage burden to suggest cerebral amyloid angiopathy. There is moderate cerebral atrophy, and  there is ex vacuo dilatation of the right frontal horn. There is no evidence of acute infarct, midline shift, or extra-axial fluid collection. Vascular: Major intracranial vascular flow voids are preserved. Skull and upper cervical spine: Bilateral burr holes related to prior subdural hematoma evacuation. No suspicious marrow lesion. Sinuses/Orbits: Bilateral cataract extraction. Small bilateral maxillary sinus mucous retention cysts. Small bilateral mastoid effusions. Other: None. IMPRESSION: 1. Multiple brain masses, many of which are hemorrhagic and now clearly demonstrate enhancement. These are consistent with metastases. 2. Up to moderate associated vasogenic edema.  No midline shift. 3. Small volume intraventricular and subarachnoid hemorrhage. Electronically Signed   By: Logan Bores M.D.   On: 01/23/2018 17:51   Mr Abdomen W NG Contrast  Result Date: 01/26/2018 CLINICAL DATA:  Inpatient. Multiple enhancing hemorrhagic bilateral cerebral masses compatible with metastatic disease of uncertain primary. Indeterminate left adrenal mass, left renal mass and cystic pancreatic tail mass on recent CT abdomen study. EXAM: MRI ABDOMEN WITHOUT AND WITH CONTRAST TECHNIQUE: Multiplanar multisequence MR imaging of the abdomen was performed both before and after the administration of intravenous contrast. CONTRAST:  10 cc Gadavist IV. COMPARISON:  01/24/2018 CT chest, abdomen and pelvis. FINDINGS: Significantly motion degraded scan, limiting assessment. Lower chest: Enlarged 1.4 cm left lower posterior mediastinal para-aortic node (series 5/image 4), unchanged from 01/24/2018 CT. Hepatobiliary: Normal liver size and configuration. Mild diffuse hepatic steatosis. No liver mass. Normal gallbladder with no cholelithiasis. No biliary ductal dilatation. Common bile duct diameter 3 mm. No choledocholithiasis.  Pancreas: There is a unilocular cystic pancreatic lesion measuring 2.4 x 1.8 x 2.0 cm arising exophytically from the  anterior pancreatic tail (series 5/image 12), which demonstrates mild wall thickening and enhancement along the posterior margin, with no internal septations. Subcentimeter simple unilocular nonenhancing pancreatic body cystic lesion. No pancreatic duct dilation. No pancreas divisum. Spleen: Normal size. No mass. Adrenals/Urinary Tract: Normal right adrenal. Left adrenal 4.6 x 3.3 cm mass (series 8/image 18) demonstrates mildly heterogeneous signal intensity on T1 and T2 weighted sequences with heterogeneous enhancement and absence of consistent loss of signal intensity on out of phase chemical shift imaging. No hydronephrosis. Several simple renal cysts in both kidneys, largest 2.7 cm in the interpolar left kidney. There is a 1.4 x 1.1 cm mildly T2 hyperintense and T1 isointense renal cortical lesion in the anterior interpolar left kidney (series 8/image 22) without convincing enhancement on the limited motion degraded postcontrast sequences, probably a Bosniak category 2 hemorrhagic/proteinaceous renal cyst. No overtly suspicious renal masses. Stomach/Bowel: Normal non-distended stomach. Visualized small and large bowel is normal caliber, with no bowel wall thickening. Vascular/Lymphatic: Normal caliber abdominal aorta. Patent portal, splenic, hepatic and renal veins. No pathologically enlarged lymph nodes in the abdomen. Other: No abdominal ascites or focal fluid collection. Musculoskeletal: No aggressive appearing focal osseous lesions. IMPRESSION: 1. Limited motion degraded scan. 2. Heterogeneous enhancing 4.6 cm left adrenal mass remains indeterminate by MRI, with no convincing intralesional lipid. Left adrenal metastasis cannot be excluded. Tissue sampling may be considered. 3. No overtly suspicious renal masses. Probable 1.4 cm Bosniak category 2 hemorrhagic/proteinaceous renal cyst in the anterior interpolar left kidney, for which follow-up MRI abdomen without and with IV contrast is recommended in 6-12  months given the limitations of this motion degraded scan. 4. 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 No pancreatic duct dilation. GI consultation for consideration of EUS/FNA is indicated. This recommendation follows ACR consensus guidelines: Management of Incidental Pancreatic Cysts: A White Paper of the ACR Incidental Findings Committee. [place] 6712;[id number]. 5. Redemonstration of nonspecific mild left lower mediastinal adenopathy. 6. Mild diffuse hepatic steatosis. Electronically Signed   By: Ilona Sorrel M.D.   On: 01/26/2018 18:09   Ct Abdomen Pelvis W Contrast  Result Date: 01/24/2018 CLINICAL DATA:  Evaluate for metastatic disease. Initial cancer workup. EXAM: CT CHEST, ABDOMEN, AND PELVIS WITH CONTRAST TECHNIQUE: Multidetector CT imaging of the chest, abdomen and pelvis was performed following the standard protocol during bolus administration of intravenous contrast. CONTRAST:  161mL OMNIPAQUE IOHEXOL 300 MG/ML  SOLN COMPARISON:  None. FINDINGS: CT CHEST FINDINGS Cardiovascular: Heart size appears within normal limits. No pericardial effusion identified. Aortic atherosclerosis. Calcifications within the LAD and RCA coronary arteries noted. Mediastinum/Nodes: Normal appearance of the thyroid gland. The trachea appears patent and is midline. No axillary or supraclavicular adenopathy. Within the left posterior mediastinum there is a lymph node measuring 1.4 cm, image 37/3. Lungs/Pleura: No pleural effusion, airspace consolidation or atelectasis. Pulmonary nodule within the posterior costophrenic sulcus measures 9 mm, image 124/5. Also within the posterior left costophrenic sulcus is a 8 mm lung nodule, image 115/5. Musculoskeletal: No chest wall mass or suspicious bone lesions identified. CT ABDOMEN PELVIS FINDINGS Hepatobiliary: No focal liver abnormality is seen. No gallstones, gallbladder  wall thickening, or biliary dilatation. Pancreas: Cystic lesion arising from the tail of pancreas measures 1.9 by 2.2 cm, image 52/3. No inflammation or main duct dilatation. Spleen: Spleen is  normal. Adrenals/Urinary Tract: Normal appearance of the right adrenal gland. Left adrenal nodule measures 4.4 by 3.3 by 5.0 cm and measures 46 HU. Normal appearance of the right kidney. Multiple exophytic cysts are noted arising from the upper and lower pole of left kidney. Indeterminate exophytic lesion arising from the anterior cortex of the interpolar left kidney measures 1 cm and 49 HU, image 64/3. No hydronephrosis identified bilaterally. No focal bladder lesion identified. Stomach/Bowel: Stomach appears normal. The small bowel loops have a normal course and caliber. No pathologic dilatation of the colon. Distal colonic diverticula noted without acute inflammation. Vascular/Lymphatic: Aortic atherosclerosis. No aneurysm. No abdominopelvic adenopathy. Reproductive: Prostate is unremarkable. Other: No abdominal wall hernia or abnormality. No abdominopelvic ascites. Musculoskeletal: Spondylosis noted within the lumbar spine. No aggressive lytic or sclerotic bone lesion. IMPRESSION: 1. No definite primary neoplasm identified. Several nonspecific findings are noted within the chest in abdomen including a 5 cm left adrenal mass. This may represent a benign or malignant tumor. Metastatic disease not excluded. More definitive characterization could be obtained with contrast enhanced abdominal MRI. 2. Small hyperdense left kidney lesion measures 1 cm and 49 HU. This may represent either and small enhancing neoplasm or hemorrhagic/proteinaceous cyst. This could also be further characterized with contrast enhanced abdominal MRI. 3. Single enlarged left posterior mediastinal lymph node measuring 1.4 cm. 4. Small exophytic cystic lesion arising from tail of pancreas measures 2.2 cm. Further characterization with contrast enhanced MR  of the abdomen is advised. 5. 2 small nodules noted within the posterior costophrenic sulcus within the left lower lobe, nonspecific. 6. As an alternative to further imaging with contrast enhanced abdominal MRI a PET-CT may be considered to assess for areas of hypermetabolism which may help guide biopsy of any suspicious hypermetabolic lesions. Electronically Signed   By: Kerby Moors M.D.   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 Please perform CT-guided left adrenal gland mass biopsy for tissue diagnostic purposes. EXAM: CT-GUIDED BIOPSY INDETERMINATE LEFT ADRENAL GLAND MASS COMPARISON:  CT of the chest, abdomen and pelvis - 01/24/2018; abdominal MRI-01/26/2018 MEDICATIONS: None. ANESTHESIA/SEDATION: Fentanyl 50 mcg IV; Versed 1 mg IV Sedation time: 15 minutes; The patient was continuously monitored during the procedure by the interventional radiology nurse under my direct supervision. CONTRAST:  None. COMPLICATIONS: None immediate. PROCEDURE: Informed consent was obtained from the patient following an explanation of the procedure, risks, benefits and alternatives. A time out was performed prior to the initiation of the procedure. The patient was positioned prone on the CT table and a limited CT was performed for procedural planning demonstrating unchanged size and appearance of the at least 4.0 x 3.2 cm left adrenal gland mass (image 42, series 2). The procedure was planned. The operative site was prepped and draped in the usual sterile fashion. Appropriate trajectory was confirmed with a 22 gauge spinal needle after the adjacent tissues were anesthetized with 1% Lidocaine with epinephrine. Under intermittent CT guidance, a 17 gauge coaxial needle was advanced into the peripheral aspect of the mass. Appropriate positioning was confirmed and 4 core needle biopsy samples were obtained  with an 18 gauge core needle biopsy device. The co-axial needle was removed following the administration of a Gel-Foam slurry and superficial hemostasis was achieved with manual compression. A limited postprocedural CT was negative for significant peri biopsy hemorrhage or additional complication. A dressing was placed. The patient tolerated the procedure well  without immediate postprocedural complication. IMPRESSION: Technically successful CT guided core needle biopsy of indeterminate left adrenal gland mass. Electronically Signed   By: Sandi Mariscal M.D.   On: 01/29/2018 12:22     CBC Recent Labs  Lab 02/12/18 1354 02/13/18 2309 02/14/18 0541  WBC 12.9* 10.0 11.3*  HGB 15.5 15.8 14.5  HCT 46.6 47.6 43.8  PLT 154 134* 133*  MCV 88.6 87.3 88.0  MCH 29.5 29.0 29.1  MCHC 33.3 33.2 33.1  RDW 13.6 14.0 13.9  LYMPHSABS 1.3  --  0.7  MONOABS 0.4  --  0.5  EOSABS 0.0  --  0.0  BASOSABS 0.0  --  0.0    Chemistries  Recent Labs  Lab 02/12/18 1354 02/13/18 2309 02/14/18 0541  NA 136 138 136  K 3.9 4.0 4.1  CL 101 103 102  CO2 25 19* 21*  GLUCOSE 84 130* 189*  BUN 18 18 17   CREATININE 1.01 1.23 1.30*  CALCIUM 9.2 9.0 8.5*  MG  --   --  2.0  AST 17 29 25   ALT 34 44 39  ALKPHOS 65 55 47  BILITOT 0.7 1.0 0.8   ------------------------------------------------------------------------------------------------------------------ No results for input(s): CHOL, HDL, LDLCALC, TRIG, CHOLHDL, LDLDIRECT in the last 72 hours.  Lab Results  Component Value Date   HGBA1C 6.1 (H) 11/25/2017   ------------------------------------------------------------------------------------------------------------------ Recent Labs    02/14/18 0541  TSH 1.138     Roxan Hockey M.D on 02/15/2018 at 7:22 PM  Pager---[phone] Go to [email or web address] - [email or web address] for contact info  [facility] - Office  [phone]

## 2018-02-16 DIAGNOSIS — G9340 Encephalopathy, unspecified: Secondary | ICD-10-CM | POA: Diagnosis not present

## 2018-02-16 DIAGNOSIS — R7303 Prediabetes: Secondary | ICD-10-CM

## 2018-02-16 DIAGNOSIS — C7931 Secondary malignant neoplasm of brain: Secondary | ICD-10-CM | POA: Diagnosis not present

## 2018-02-16 DIAGNOSIS — N390 Urinary tract infection, site not specified: Secondary | ICD-10-CM | POA: Diagnosis not present

## 2018-02-16 LAB — BASIC METABOLIC PANEL
Anion gap: 14 (ref 5–15)
BUN: 22 mg/dL (ref 8–23)
CALCIUM: 8.4 mg/dL — AB (ref 8.9–10.3)
CO2: 20 mmol/L — AB (ref 22–32)
Chloride: 102 mmol/L (ref 98–111)
Creatinine, Ser: 0.98 mg/dL (ref 0.61–1.24)
GFR calc Af Amer: >60 mL/min (ref >60)
GFR calc non Af Amer: >60 mL/min (ref >60)
Glucose, Bld: 111 mg/dL — ABNORMAL HIGH (ref 70–99)
Potassium: 3.9 mmol/L (ref 3.5–5.1)
Sodium: 136 mmol/L (ref 135–145)

## 2018-02-16 LAB — GLUCOSE, CAPILLARY
Glucose-Capillary: 82 mg/dL (ref 70–99)
Glucose-Capillary: 93 mg/dL (ref 70–99)

## 2018-02-16 MED ORDER — CIPROFLOXACIN HCL 500 MG PO TABS: 500.0000 mg | ORAL_TABLET | Freq: Two times a day (BID) | ORAL | 0 refills | Status: AC

## 2018-02-16 MED ORDER — CIPROFLOXACIN HCL 500 MG PO TABS: 500.0000 mg | ORAL_TABLET | Freq: Two times a day (BID) | ORAL | 0 refills | Status: DC

## 2018-02-16 NOTE — Care Management Important Message (Signed)
Important Message  Patient Details  Name: [name] [name] MRN: [id number] Date of Birth: Apr 17, 1940   Medicare Important Message Given:  Yes    Erenest Rasher, RN 02/16/2018, 12:53 PM

## 2018-02-16 NOTE — Discharge Summary (Addendum)
PATIENT DETAILS Name: [name] [name] Age: 77 y.o. Sex: male Date of Birth: [birth year] MRN: [id number]. Admitting Physician: Rise Patience, MD AGT:[email or web address], [name], MD  Admit Date: 02/13/2018 Discharge date: 02/16/2018  Recommendations for Outpatient Follow-up:  1. Follow up with PCP in 1-2 weeks 2. Please obtain BMP/CBC in one week 3. Please ensure follow-up cancer center for radiation therapy on 12/23  Admitted From:  Home  Disposition: Home with home health [place]: Yes  Equipment/Devices: None  Discharge Condition: Stable  CODE STATUS: FULL CODE  Diet recommendation:  Heart Healthy / Carb Modified / Regular   Brief Summary: See H&P, Labs, Consult and Test reports for all details in brief, Patient is a 77 y.o. male with metastatic melanoma with multiple brain metastases, history of A. fib no longer on anticoagulation, hypothyroidism-brought to the ED for confusion.Marland Kitchen  He was started on antimicrobial therapy and admitted to the hospitalist service.  See below for further details  Brief Hospital Course: 1) acute encephalopathy: Multifactorial-suspect could be from underlying brain mets and possible UTI.  He is completely awake and alert morning and is actually requesting discharge.  Spoke to patient's wife over the phone-she is comfortable taking him home.  Stable for discharge-follow-up with primary care practitioner for further continued care.    2) melanoma with brain mets--- currently on Decadron and undergoing radiation therapy with Dr. Isidore Moos--- this could be contributing to encephalopathy.  Both patient and spouse were instructed to follow with radiation oncology on 12/23.  3) paroxysmal atrial fibrillation--- continue flecainide 100 mg twice daily, rate appears controlled, no for anticoagulation due to prior history of ICH and subdural hematoma  4)Glucose intolerance--anticipate fluctuation of glucose with Decadron use, continue  sliding scale coverage, recent A1c was 6.1.  Resume metformin on discharge.  5) hypothyroidism--recent TSH was 1.13, patient clinically euthyroid, continue levothyroxine 100 mcg daily  6)UTI--- suspect causing #1 above, continue IV Zosyn, no fevers, no emesis , urine culture positive for Proteus-we will transition to few more days of ciprofloxacin on discharge.  Procedures/Studies: None  Discharge Diagnoses:  Principal Problem:   Acute encephalopathy Active Problems:   Hypothyroidism   Essential hypertension   Paroxysmal atrial fibrillation (HCC)   ICH (intracerebral hemorrhage) (HCC)   Brain metastases (HCC)   Acute lower UTI   Discharge Instructions:  Activity:  As tolerated  Discharge Instructions    Call MD for:  extreme fatigue   Complete by:  As directed    Call MD for:  persistant nausea and vomiting   Complete by:  As directed    Diet - low sodium heart healthy   Complete by:  As directed    Diet Carb Modified   Complete by:  As directed    Discharge instructions   Complete by:  As directed    Follow with Primary MD  Dettinger, [name], MD in 1 week  Follow at radiation oncology on 12/23-appointment is at 12 noon-please be there half an hour early.  Please get a complete blood count and chemistry panel checked by your Primary MD at your next visit, and again as instructed by your Primary MD.  Get Medicines reviewed and adjusted: Please take all your medications with you for your next visit with your Primary MD  Laboratory/radiological data: Please request your Primary MD to go over all hospital tests and procedure/radiological results at the follow up, please ask your Primary MD to get all Hospital records sent to his/her office.  In some cases, they will be blood work, cultures and biopsy results pending at the time of your discharge. Please request that your primary care M.D. follows up on these results.  Also Note the following: If you experience  worsening of your admission symptoms, develop shortness of breath, life threatening emergency, suicidal or homicidal thoughts you must seek medical attention immediately by calling 911 or calling your MD immediately  if symptoms less severe.  You must read complete instructions/literature along with all the possible adverse reactions/side effects for all the Medicines you take and that have been prescribed to you. Take any new Medicines after you have completely understood and accpet all the possible adverse reactions/side effects.   Do not drive when taking Pain medications or sleeping medications (Benzodaizepines)  Do not take more than prescribed Pain, Sleep and Anxiety Medications. It is not advisable to combine anxiety,sleep and pain medications without talking with your primary care practitioner  Special Instructions: If you have smoked or chewed Tobacco  in the last 2 yrs please stop smoking, stop any regular Alcohol  and or any Recreational drug use.  Wear Seat belts while driving.  Please note: You were cared for by a hospitalist during your hospital stay. Once you are discharged, your primary care physician will handle any further medical issues. Please note that NO REFILLS for any discharge medications will be authorized once you are discharged, as it is imperative that you return to your primary care physician (or establish a relationship with a primary care physician if you do not have one) for your post hospital discharge needs so that they can reassess your need for medications and monitor your lab values.   Increase activity slowly   Complete by:  As directed      Allergies as of 02/16/2018   No Known Allergies     Medication List    TAKE these medications   albuterol 108 (90 Base) MCG/ACT inhaler Commonly known as:  PROVENTIL HFA;VENTOLIN HFA Inhale 2 puffs into the lungs every 6 (six) hours as needed for wheezing or shortness of breath.   atorvastatin 10 MG  tablet Commonly known as:  LIPITOR Take 1 tablet (10 mg total) by mouth daily.   ciprofloxacin 500 MG tablet Commonly known as:  CIPRO Take 1 tablet (500 mg total) by mouth 2 (two) times daily for 2 days.   dexamethasone 4 MG tablet Commonly known as:  DECADRON Take 1 tablet (4 mg total) by mouth daily.   flecainide 100 MG tablet Commonly known as:  TAMBOCOR TAKE 1 TABLET (100 MG TOTAL) BY MOUTH 2 (TWO) TIMES DAILY. What changed:    how much to take  how to take this  when to take this  additional instructions   levothyroxine 50 MCG tablet Commonly known as:  SYNTHROID, LEVOTHROID TAKE 2 TABLETS BY MOUTH DAILY What changed:  when to take this   metFORMIN 500 MG 24 hr tablet Commonly known as:  GLUCOPHAGE-XR Take 1 tablet (500 mg total) by mouth daily with breakfast. (Need to be seen)      Follow-up Information    Dettinger, [name], MD. Schedule an appointment as soon as possible for a visit in 1 week(s).   Specialties:  Family Medicine, Cardiology Contact information: [place] [address] [phone]        Eppie Gibson, MD. Daphane Shepherd on 02/17/2018.   Specialty:  Radiation Oncology Why:  appt at 12 pm Contact information: [address]  [place] [address] [phone]          No Known Allergies  Consultations:   Radiation oncology, and neurosurgery   Other Procedures/Studies: Ct Head Wo Contrast  Result Date: 02/13/2018 CLINICAL DATA:  Altered LOC EXAM: CT HEAD WITHOUT CONTRAST TECHNIQUE: Contiguous axial images were obtained from the base of the skull through the vertex without intravenous contrast. COMPARISON:  MRI 02/10/2018, CT brain 01/24/2018, 01/22/2018, 11/18/2017 FINDINGS: Brain: Multiple hemorrhagic brain masses, corresponding to history of metastatic melanoma. Right frontal lobe adjacent lesions are increased in size compared to CT 01/24/2018, but grossly unchanged as compared with MRI 02/10/2018. Hyperdense portion of  the mass measures 3.6 x 3 cm, previous measurements in November of 3.6 x 2.4 cm. Increased peripheral hemorrhagic material within the right frontal lobe hyperdense mass as compared with the CT from November. Left parasagittal frontal lobe inferior lesion measures 13 x 11 mm, previously 14 x 12 mm. Left posterior frontal lobe lesion measures 2.7 x 2.6, previously 2.8 x 2.8 cm. Right frontal operculum lesion measuring 15 x 13 mm, previously 16 x 15 mm. Vasogenic edema surrounding the multiple masses, grossly similar as compared with most recent MRI. Decreased hemorrhagic material in the left posterior horn. Slight increased hemorrhagic material in the right posterior horn as compared with prior CT. Probably not significantly changed as compared. Enlarged ventricles are stable in size. There is no midline shift. Vascular: No hyperdense vessels.  Carotid vascular calcification. Skull: Bilateral burr hole and left posterior craniotomy changes. No fracture Sinuses/Orbits: Mucous retention cysts in the maxillary sinuses. Other: None IMPRESSION: 1. Grossly stable size of multiple hemorrhagic brain masses since MRI 02/10/2018 with surrounding edema. Stable enlarged ventricles. No midline shift. No new mass. 2. Similar appearance of small right greater than left intraventricular hemorrhage since MRI 02/10/2018 Electronically Signed   By: Donavan Foil M.D.   On: 02/13/2018 23:59   Ct Head Wo Contrast  Result Date: 01/24/2018 CLINICAL DATA:  Follow-up examination for acute stroke. EXAM: CT HEAD WITHOUT CONTRAST TECHNIQUE: Contiguous axial images were obtained from the base of the skull through the vertex without intravenous contrast. COMPARISON:  Previous CT from 01/22/2018 as well as brain MRI from 01/23/2018. FINDINGS: Brain: Multiple hemorrhagic brain masses again seen, relatively stable and unchanged in size as compared to most recent CT. Largest right frontal lobe mass measures 3.6 x 2.4 cm. Dominant left frontal  lesion measures 2.7 x 2.8 cm. Mass at the left gyrus rectus measures 1.4 x 1.2 cm. Additional right frontal operculum lower lesion measures 1.5 x 1.5 cm. Surrounding vasogenic edema about several of these lesions, most notable at the left frontal lesion. No midline shift. Basilar cisterns remain patent. Cystic encephalomalacia at the adjacent right frontal lesion with is small amount of layering hemorrhage again noted. Intraventricular extension with small amount of blood seen layering within the occipital horns, also unchanged. No other new intracranial hemorrhage or mass lesion identified. No acute large vessel territory infarct. No extra-axial fluid collection. Atrophy with chronic microvascular ischemic disease again noted. Vascular: No hyperdense vessel. Scattered vascular calcifications noted within the carotid siphons. Skull: Scalp soft tissues demonstrate no acute finding. Sequelae of prior bilateral burr hole craniotomy. Sinuses/Orbits: Globes orbital soft tissues within normal limits. Patient status post bilateral ocular lens replacement. Paranasal sinuses remain largely clear. Small bilateral maxillary sinus retention cyst noted. Small bilateral mastoid effusions noted. Other: None. IMPRESSION: 1. Relatively stable appearance of multifocal hemorrhagic masses with localized edema as above. No midline shift. 2. Associated small  volume intraventricular hemorrhage, relatively unchanged. Stable ventricular size without hydrocephalus. 3. No other new acute intracranial abnormality. Electronically Signed   By: Jeannine Boga M.D.   On: 01/24/2018 02:17   Ct Head Wo Contrast  Result Date: 01/23/2018 CLINICAL DATA:  Initial evaluation for acute headache, recent intracranial hemorrhages. EXAM: CT HEAD WITHOUT CONTRAST TECHNIQUE: Contiguous axial images were obtained from the base of the skull through the vertex without intravenous contrast. COMPARISON:  Prior CT from 12/17/2017. FINDINGS: Brain: Since  the previous exam, the right frontal intraparenchymal hemorrhage has enlarged in size now measuring 3.5 x 2.4 x 1.9 cm (series 2, image 25). Localized edema within this region overall slightly improved. Adjacent cystic encephalomalacia of now contains a small amount of layering hemorrhage (series 2, image 22). Additional left frontal intraparenchymal hematoma also increased in size now measuring 3.1 x 2.5 x 2.5 cm (series 2, image 22, previously 1.8 cm). Localized vasogenic edema slightly worsened from previous. Additional right frontotemporal hemorrhage also increased in size now measuring 1.5 x 1.7 x 1.1 cm (series 2, image 17). Localized vasogenic edema has worsened without significant regional mass effect. Additional soft tissue density at the parasagittal aspect of the anterior inferior left frontal lobe measures 1.5 x 1.2 x 1.3 cm with mild localized edema without midline shift. This may have been present on prior CT, although difficult to see on prior exam. Finding also likely reflects a small intraparenchymal hemorrhage. New small volume intraventricular hemorrhage with blood seen layering within the occipital horns of both lateral ventricles, likely via extension from the right frontal ventricular size is relatively stable without interval hydrocephalus. Basilar cisterns remain patent. No other acute intracranial hemorrhage. No subarachnoid or extra-axial hemorrhage appreciated. No acute large vessel territory infarct. Underlying cerebral atrophy, stable. Vascular: No hyperdense vessel. Scattered vascular calcifications noted within the carotid siphons. Skull: Scalp soft tissues demonstrate no acute abnormality. Sequelae of prior bilateral burr hole craniotomy. Sinuses/Orbits: Globes and orbital soft tissues within normal limits. Small bilateral maxillary sinus retention cyst noted. Paranasal sinuses are otherwise clear. Small chronic right mastoid effusion noted. Other: None. IMPRESSION: 1. Interval  increase in size of bilateral intraparenchymal hematomas as above. Localized vasogenic edema without significant midline shift. 2. New small volume intraventricular hemorrhage, likely via extension from the right frontal hematoma and adjacent cystic encephalomalacia. Stable ventricular size without hydrocephalus or ventricular trapping at this time. Critical Value/emergent results were called by telephone at the time of interpretation on 01/23/2018 at 12:13 am to Dr. Rolland Porter , who verbally acknowledged these results. Electronically Signed   By: Jeannine Boga M.D.   On: 01/23/2018 00:18   Ct Chest W Contrast  Result Date: 01/24/2018 CLINICAL DATA:  Evaluate for metastatic disease. Initial cancer workup. EXAM: CT CHEST, ABDOMEN, AND PELVIS WITH CONTRAST TECHNIQUE: Multidetector CT imaging of the chest, abdomen and pelvis was performed following the standard protocol during bolus administration of intravenous contrast. CONTRAST:  156mL OMNIPAQUE IOHEXOL 300 MG/ML  SOLN COMPARISON:  None. FINDINGS: CT CHEST FINDINGS Cardiovascular: Heart size appears within normal limits. No pericardial effusion identified. Aortic atherosclerosis. Calcifications within the LAD and RCA coronary arteries noted. Mediastinum/Nodes: Normal appearance of the thyroid gland. The trachea appears patent and is midline. No axillary or supraclavicular adenopathy. Within the left posterior mediastinum there is a lymph node measuring 1.4 cm, image 37/3. Lungs/Pleura: No pleural effusion, airspace consolidation or atelectasis. Pulmonary nodule within the posterior costophrenic sulcus measures 9 mm, image 124/5. Also within the posterior left costophrenic sulcus is a  8 mm lung nodule, image 115/5. Musculoskeletal: No chest wall mass or suspicious bone lesions identified. CT ABDOMEN PELVIS FINDINGS Hepatobiliary: No focal liver abnormality is seen. No gallstones, gallbladder wall thickening, or biliary dilatation. Pancreas: Cystic lesion  arising from the tail of pancreas measures 1.9 by 2.2 cm, image 52/3. No inflammation or main duct dilatation. Spleen: Spleen is normal. Adrenals/Urinary Tract: Normal appearance of the right adrenal gland. Left adrenal nodule measures 4.4 by 3.3 by 5.0 cm and measures 46 HU. Normal appearance of the right kidney. Multiple exophytic cysts are noted arising from the upper and lower pole of left kidney. Indeterminate exophytic lesion arising from the anterior cortex of the interpolar left kidney measures 1 cm and 49 HU, image 64/3. No hydronephrosis identified bilaterally. No focal bladder lesion identified. Stomach/Bowel: Stomach appears normal. The small bowel loops have a normal course and caliber. No pathologic dilatation of the colon. Distal colonic diverticula noted without acute inflammation. Vascular/Lymphatic: Aortic atherosclerosis. No aneurysm. No abdominopelvic adenopathy. Reproductive: Prostate is unremarkable. Other: No abdominal wall hernia or abnormality. No abdominopelvic ascites. Musculoskeletal: Spondylosis noted within the lumbar spine. No aggressive lytic or sclerotic bone lesion. IMPRESSION: 1. No definite primary neoplasm identified. Several nonspecific findings are noted within the chest in abdomen including a 5 cm left adrenal mass. This may represent a benign or malignant tumor. Metastatic disease not excluded. More definitive characterization could be obtained with contrast enhanced abdominal MRI. 2. Small hyperdense left kidney lesion measures 1 cm and 49 HU. This may represent either and small enhancing neoplasm or hemorrhagic/proteinaceous cyst. This could also be further characterized with contrast enhanced abdominal MRI. 3. Single enlarged left posterior mediastinal lymph node measuring 1.4 cm. 4. Small exophytic cystic lesion arising from tail of pancreas measures 2.2 cm. Further characterization with contrast enhanced MR of the abdomen is advised. 5. 2 small nodules noted within the  posterior costophrenic sulcus within the left lower lobe, nonspecific. 6. As an alternative to further imaging with contrast enhanced abdominal MRI a PET-CT may be considered to assess for areas of hypermetabolism which may help guide biopsy of any suspicious hypermetabolic lesions. Electronically Signed   By: Kerby Moors M.D.   On: 01/24/2018 21:32   Mr Jeri Cos MV Contrast  Result Date: 02/11/2018 CLINICAL DATA:  76 year old male with prior brain hemorrhages. History of atrial fibrillation, not on anticoagulation. Intracranial hemorrhages with follow-up MRI 01/23/2018 demonstrating multiple enhancing brain masses suggesting metastatic disease. Status post CT-guided biopsy of a left adrenal gland mass, pathology revealing Metastatic Melanoma. Treatment planning. EXAM: MRI HEAD WITHOUT AND WITH CONTRAST TECHNIQUE: Multiplanar, multiecho pulse sequences of the brain and surrounding structures were obtained without and with intravenous contrast. CONTRAST:  10 milliliters Gadavist COMPARISON:  01/23/2018. FINDINGS: Study is intermittently degraded by motion artifact despite repeated imaging attempts. Brain: Bilateral frontal lobe hemorrhagic and enhancing masses with only occasional intrinsic T1 hyperintensity. A total of SIX lesions are identified if the right superior frontal lobe adjacent cystic and solid masses are treated as two (versus alternatively five overall if you consider one conglomerate cystic/solid mass measuring up to 4.3 centimeters as on series 13, image 30): - 7 millimeter round enhancing mass just anterior inferior to the right basal ganglia on series 12, image 99. - 19 millimeter right inferior frontal gyrus mass near the operculum on series 12, image 110. - 14 millimeter left anterior inferior frontal gyrus mass on series 12, image 123. - 27 millimeter left middle frontal gyrus mass just above the operculum  on image 126. - 37 millimeter largely cystic, rim enhancing mass in the superior  right frontal lobe on image 134. - adjacent 38 millimeter solid right superior frontal gyrus mass on image 148. Most of these have mildly increased in size since November (the lesions on images 126 and 134 are relatively stable). Vasogenic edema associated with each of the lesions has not significantly changed since November. Additionally there is a tiny indeterminate area on series 12, image 147 in the superior right parietal lobe which is only identified on the axial images and does not have associated hemosiderin. This is probably artifact. No definite new brain mass identified. Overlying changes of bilateral craniotomy and/or burr hole placement which likely explains areas of mild dural thickening as postoperative in nature. Other areas of intracranial hemorrhage which are presumably benign. Persistent intraventricular hemorrhage layering in both occipital horns and along the right choroid plexus which is not abnormally enhancing. Superimposed superficial siderosis. No midline shift. Basilar cisterns remain patent. No restricted diffusion or evidence of acute infarction. Stable ventricle size. Negative pituitary and cervicomedullary junction. Vascular: Major intracranial vascular flow voids are preserved. Skull and upper cervical spine: Negative visible cervical spine and spinal cord. Postoperative changes to the skull. Visualized bone marrow signal is within normal limits. Sinuses/Orbits: Stable and negative orbits. Small paranasal sinus mucous retention cysts are stable. Other: Mild mastoid effusions have regressed. No suspicious scalp or face soft tissue abnormality. IMPRESSION: 1. 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 No new lesions since November (see #2). All metastases are annotated on series 12. Most demonstrate mild enlargement since November, with the range of 7 to 38 mm diameter. Associated vasogenic edema has  not significantly changed. No significant intracranial mass effect at this time. 2. Probable artifact in the superior right parietal lobe on series 12, image 147. Attention directed on follow-up. 3. 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 Electronically Signed   By: Genevie Ann M.D.   On: 02/11/2018 08:52   Mr Jeri Cos IR Contrast  Result Date: 01/23/2018 CLINICAL DATA:  Headaches.  Intracranial hemorrhages. EXAM: MRI HEAD WITHOUT AND WITH CONTRAST TECHNIQUE: Multiplanar, multiecho pulse sequences of the brain and surrounding structures were obtained without and with intravenous contrast. CONTRAST:  10 mL Gadavist COMPARISON:  Head CT 01/22/2018 and MRI 12/12/2017 FINDINGS: Brain: A hemorrhagic right frontal mass demonstrates solid enhancement and measures 3.2 x 2.7 cm (series 15, image 130). A cystic component or separate cystic lesion/cystic encephalomalacia inferior to the solid mass measures 4.2 x 2.6 cm and demonstrates a small amount of peripheral linear and nodular enhancement (series 15, image 116). A 1.7 cm hemorrhagic right frontal lobe mass anterior to the insula has enlarged from the prior MRI and now clearly enhances (series 15, image 90). A 1.2 cm enhancing mass in the anteromedial left frontal lobe is new or larger compared to the prior MRI (series 15, image 89). A 2.6 x 2.6 cm hemorrhagic lateral left frontal lobe mass has enlarged from the prior MRI and now also clearly has solid enhancing components. A 6 mm ring-enhancing inferior right frontal lesion is new (series 15, image 79). Motion artifact could obscure additional small enhancing lesions. There is moderate vasogenic edema associated with the lateral left frontal mass with milder edema associated with other bilateral frontal masses. As seen on the recent head CT, there is a small amount of  blood in the occipital horns of the lateral ventricles. There is also trace  subarachnoid hemorrhage posteriorly in the sylvian fissures. There is not a significant chronic microhemorrhage burden to suggest cerebral amyloid angiopathy. There is moderate cerebral atrophy, and there is ex vacuo dilatation of the right frontal horn. There is no evidence of acute infarct, midline shift, or extra-axial fluid collection. Vascular: Major intracranial vascular flow voids are preserved. Skull and upper cervical spine: Bilateral burr holes related to prior subdural hematoma evacuation. No suspicious marrow lesion. Sinuses/Orbits: Bilateral cataract extraction. Small bilateral maxillary sinus mucous retention cysts. Small bilateral mastoid effusions. Other: None. IMPRESSION: 1. Multiple brain masses, many of which are hemorrhagic and now clearly demonstrate enhancement. These are consistent with metastases. 2. Up to moderate associated vasogenic edema.  No midline shift. 3. Small volume intraventricular and subarachnoid hemorrhage. Electronically Signed   By: Logan Bores M.D.   On: 01/23/2018 17:51   Mr Abdomen W FX Contrast  Result Date: 01/26/2018 CLINICAL DATA:  Inpatient. Multiple enhancing hemorrhagic bilateral cerebral masses compatible with metastatic disease of uncertain primary. Indeterminate left adrenal mass, left renal mass and cystic pancreatic tail mass on recent CT abdomen study. EXAM: MRI ABDOMEN WITHOUT AND WITH CONTRAST TECHNIQUE: Multiplanar multisequence MR imaging of the abdomen was performed both before and after the administration of intravenous contrast. CONTRAST:  10 cc Gadavist IV. COMPARISON:  01/24/2018 CT chest, abdomen and pelvis. FINDINGS: Significantly motion degraded scan, limiting assessment. Lower chest: Enlarged 1.4 cm left lower posterior mediastinal para-aortic node (series 5/image 4), unchanged from 01/24/2018 CT. Hepatobiliary: Normal liver size and configuration. Mild diffuse hepatic steatosis. No liver mass. Normal gallbladder with no cholelithiasis. No  biliary ductal dilatation. Common bile duct diameter 3 mm. No choledocholithiasis. Pancreas: There is a unilocular cystic pancreatic lesion measuring 2.4 x 1.8 x 2.0 cm arising exophytically from the anterior pancreatic tail (series 5/image 12), which demonstrates mild wall thickening and enhancement along the posterior margin, with no internal septations. Subcentimeter simple unilocular nonenhancing pancreatic body cystic lesion. No pancreatic duct dilation. No pancreas divisum. Spleen: Normal size. No mass. Adrenals/Urinary Tract: Normal right adrenal. Left adrenal 4.6 x 3.3 cm mass (series 8/image 18) demonstrates mildly heterogeneous signal intensity on T1 and T2 weighted sequences with heterogeneous enhancement and absence of consistent loss of signal intensity on out of phase chemical shift imaging. No hydronephrosis. Several simple renal cysts in both kidneys, largest 2.7 cm in the interpolar left kidney. There is a 1.4 x 1.1 cm mildly T2 hyperintense and T1 isointense renal cortical lesion in the anterior interpolar left kidney (series 8/image 22) without convincing enhancement on the limited motion degraded postcontrast sequences, probably a Bosniak category 2 hemorrhagic/proteinaceous renal cyst. No overtly suspicious renal masses. Stomach/Bowel: Normal non-distended stomach. Visualized small and large bowel is normal caliber, with no bowel wall thickening. Vascular/Lymphatic: Normal caliber abdominal aorta. Patent portal, splenic, hepatic and renal veins. No pathologically enlarged lymph nodes in the abdomen. Other: No abdominal ascites or focal fluid collection. Musculoskeletal: No aggressive appearing focal osseous lesions. IMPRESSION: 1. Limited motion degraded scan. 2. Heterogeneous enhancing 4.6 cm left adrenal mass remains indeterminate by MRI, with no convincing intralesional lipid. Left adrenal metastasis cannot be excluded. Tissue sampling may be considered. 3. No overtly suspicious renal masses.  Probable 1.4 cm Bosniak category 2 hemorrhagic/proteinaceous renal cyst in the anterior interpolar left kidney, for which follow-up MRI abdomen without and with IV contrast is recommended in 6-12 months given the limitations of this motion degraded scan. 4.  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 No pancreatic duct dilation. GI consultation for consideration of EUS/FNA is indicated. This recommendation follows ACR consensus guidelines: Management of Incidental Pancreatic Cysts: A White Paper of the ACR Incidental Findings Committee. [place] [id number]. 5. Redemonstration of nonspecific mild left lower mediastinal adenopathy. 6. Mild diffuse hepatic steatosis. Electronically Signed   By: Ilona Sorrel M.D.   On: 01/26/2018 18:09   Ct Abdomen Pelvis W Contrast  Result Date: 01/24/2018 CLINICAL DATA:  Evaluate for metastatic disease. Initial cancer workup. EXAM: CT CHEST, ABDOMEN, AND PELVIS WITH CONTRAST TECHNIQUE: Multidetector CT imaging of the chest, abdomen and pelvis was performed following the standard protocol during bolus administration of intravenous contrast. CONTRAST:  124mL OMNIPAQUE IOHEXOL 300 MG/ML  SOLN COMPARISON:  None. FINDINGS: CT CHEST FINDINGS Cardiovascular: Heart size appears within normal limits. No pericardial effusion identified. Aortic atherosclerosis. Calcifications within the LAD and RCA coronary arteries noted. Mediastinum/Nodes: Normal appearance of the thyroid gland. The trachea appears patent and is midline. No axillary or supraclavicular adenopathy. Within the left posterior mediastinum there is a lymph node measuring 1.4 cm, image 37/3. Lungs/Pleura: No pleural effusion, airspace consolidation or atelectasis. Pulmonary nodule within the posterior costophrenic sulcus measures 9 mm, image 124/5. Also within the posterior left costophrenic sulcus is a 8 mm lung  nodule, image 115/5. Musculoskeletal: No chest wall mass or suspicious bone lesions identified. CT ABDOMEN PELVIS FINDINGS Hepatobiliary: No focal liver abnormality is seen. No gallstones, gallbladder wall thickening, or biliary dilatation. Pancreas: Cystic lesion arising from the tail of pancreas measures 1.9 by 2.2 cm, image 52/3. No inflammation or main duct dilatation. Spleen: Spleen is normal. Adrenals/Urinary Tract: Normal appearance of the right adrenal gland. Left adrenal nodule measures 4.4 by 3.3 by 5.0 cm and measures 46 HU. Normal appearance of the right kidney. Multiple exophytic cysts are noted arising from the upper and lower pole of left kidney. Indeterminate exophytic lesion arising from the anterior cortex of the interpolar left kidney measures 1 cm and 49 HU, image 64/3. No hydronephrosis identified bilaterally. No focal bladder lesion identified. Stomach/Bowel: Stomach appears normal. The small bowel loops have a normal course and caliber. No pathologic dilatation of the colon. Distal colonic diverticula noted without acute inflammation. Vascular/Lymphatic: Aortic atherosclerosis. No aneurysm. No abdominopelvic adenopathy. Reproductive: Prostate is unremarkable. Other: No abdominal wall hernia or abnormality. No abdominopelvic ascites. Musculoskeletal: Spondylosis noted within the lumbar spine. No aggressive lytic or sclerotic bone lesion. IMPRESSION: 1. No definite primary neoplasm identified. Several nonspecific findings are noted within the chest in abdomen including a 5 cm left adrenal mass. This may represent a benign or malignant tumor. Metastatic disease not excluded. More definitive characterization could be obtained with contrast enhanced abdominal MRI. 2. Small hyperdense left kidney lesion measures 1 cm and 49 HU. This may represent either and small enhancing neoplasm or hemorrhagic/proteinaceous cyst. This could also be further characterized with contrast enhanced abdominal MRI. 3.  Single enlarged left posterior mediastinal lymph node measuring 1.4 cm. 4. Small exophytic cystic lesion arising from tail of pancreas measures 2.2 cm. Further characterization with contrast enhanced MR of the abdomen is advised. 5. 2 small nodules noted within the posterior costophrenic sulcus within the left lower lobe, nonspecific. 6. As an alternative to further imaging with contrast enhanced abdominal MRI a PET-CT may be considered to assess for areas of hypermetabolism which may help guide biopsy of any suspicious  hypermetabolic lesions. Electronically Signed   By: Kerby Moors M.D.   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 Please perform CT-guided left adrenal gland mass biopsy for tissue diagnostic purposes. EXAM: CT-GUIDED BIOPSY INDETERMINATE LEFT ADRENAL GLAND MASS COMPARISON:  CT of the chest, abdomen and pelvis - 01/24/2018; abdominal MRI-01/26/2018 MEDICATIONS: None. ANESTHESIA/SEDATION: Fentanyl 50 mcg IV; Versed 1 mg IV Sedation time: 15 minutes; The patient was continuously monitored during the procedure by the interventional radiology nurse under my direct supervision. CONTRAST:  None. COMPLICATIONS: None immediate. PROCEDURE: Informed consent was obtained from the patient following an explanation of the procedure, risks, benefits and alternatives. A time out was performed prior to the initiation of the procedure. The patient was positioned prone on the CT table and a limited CT was performed for procedural planning demonstrating unchanged size and appearance of the at least 4.0 x 3.2 cm left adrenal gland mass (image 42, series 2). The procedure was planned. The operative site was prepped and draped in the usual sterile fashion. Appropriate trajectory was confirmed with a 22 gauge spinal needle after the adjacent tissues were anesthetized with 1% Lidocaine with  epinephrine. Under intermittent CT guidance, a 17 gauge coaxial needle was advanced into the peripheral aspect of the mass. Appropriate positioning was confirmed and 4 core needle biopsy samples were obtained with an 18 gauge core needle biopsy device. The co-axial needle was removed following the administration of a Gel-Foam slurry and superficial hemostasis was achieved with manual compression. A limited postprocedural CT was negative for significant peri biopsy hemorrhage or additional complication. A dressing was placed. The patient tolerated the procedure well without immediate postprocedural complication. IMPRESSION: Technically successful CT guided core needle biopsy of indeterminate left adrenal gland mass. Electronically Signed   By: Sandi Mariscal M.D.   On: 01/29/2018 12:22     TODAY-DAY OF DISCHARGE:  Subjective:   [name] [name] today has no headache,no chest abdominal pain,no new weakness tingling or numbness, feels much better wants to go home today.  Objective:   Blood pressure 124/84, pulse 78, temperature 98.6 F (37 C), temperature source Oral, resp. rate 18, height 5\' 9"  (1.753 m), weight 117.9 kg, SpO2 97 %.  Intake/Output Summary (Last 24 hours) at 02/16/2018 1045 Last data filed at 02/15/2018 1852 Gross per 24 hour  Intake 939.38 ml  Output -  Net 939.38 ml   Filed Weights   02/14/18 0027 02/14/18 0605  Weight: 119.7 kg 117.9 kg    Exam: Awake Alert, Oriented *3, No new F.N deficits, Normal affect [place].AT,PERRAL Supple Neck,No JVD, No cervical lymphadenopathy appriciated.  Symmetrical Chest wall movement, Good air movement bilaterally, CTAB RRR,No Gallops,Rubs or new Murmurs, No Parasternal Heave +ve B.Sounds, Abd Soft, Non tender, No organomegaly appriciated, No rebound -guarding or rigidity. No Cyanosis, Clubbing or edema, No new Rash or bruise   PERTINENT RADIOLOGIC STUDIES: Ct Head Wo Contrast  Result Date: 02/13/2018 CLINICAL DATA:  Altered LOC EXAM: CT  HEAD WITHOUT CONTRAST TECHNIQUE: Contiguous axial images were obtained from the base of the skull through the vertex without intravenous contrast. COMPARISON:  MRI 02/10/2018, CT brain 01/24/2018, 01/22/2018, 11/18/2017 FINDINGS: Brain: Multiple hemorrhagic brain masses, corresponding to history of metastatic melanoma. Right frontal lobe adjacent lesions are increased in size compared to CT 01/24/2018, but grossly unchanged as compared with MRI 02/10/2018. Hyperdense portion of the mass measures 3.6 x 3 cm, previous measurements  in November of 3.6 x 2.4 cm. Increased peripheral hemorrhagic material within the right frontal lobe hyperdense mass as compared with the CT from November. Left parasagittal frontal lobe inferior lesion measures 13 x 11 mm, previously 14 x 12 mm. Left posterior frontal lobe lesion measures 2.7 x 2.6, previously 2.8 x 2.8 cm. Right frontal operculum lesion measuring 15 x 13 mm, previously 16 x 15 mm. Vasogenic edema surrounding the multiple masses, grossly similar as compared with most recent MRI. Decreased hemorrhagic material in the left posterior horn. Slight increased hemorrhagic material in the right posterior horn as compared with prior CT. Probably not significantly changed as compared. Enlarged ventricles are stable in size. There is no midline shift. Vascular: No hyperdense vessels.  Carotid vascular calcification. Skull: Bilateral burr hole and left posterior craniotomy changes. No fracture Sinuses/Orbits: Mucous retention cysts in the maxillary sinuses. Other: None IMPRESSION: 1. Grossly stable size of multiple hemorrhagic brain masses since MRI 02/10/2018 with surrounding edema. Stable enlarged ventricles. No midline shift. No new mass. 2. Similar appearance of small right greater than left intraventricular hemorrhage since MRI 02/10/2018 Electronically Signed   By: Donavan Foil M.D.   On: 02/13/2018 23:59   Ct Head Wo Contrast  Result Date: 01/24/2018 CLINICAL DATA:   Follow-up examination for acute stroke. EXAM: CT HEAD WITHOUT CONTRAST TECHNIQUE: Contiguous axial images were obtained from the base of the skull through the vertex without intravenous contrast. COMPARISON:  Previous CT from 01/22/2018 as well as brain MRI from 01/23/2018. FINDINGS: Brain: Multiple hemorrhagic brain masses again seen, relatively stable and unchanged in size as compared to most recent CT. Largest right frontal lobe mass measures 3.6 x 2.4 cm. Dominant left frontal lesion measures 2.7 x 2.8 cm. Mass at the left gyrus rectus measures 1.4 x 1.2 cm. Additional right frontal operculum lower lesion measures 1.5 x 1.5 cm. Surrounding vasogenic edema about several of these lesions, most notable at the left frontal lesion. No midline shift. Basilar cisterns remain patent. Cystic encephalomalacia at the adjacent right frontal lesion with is small amount of layering hemorrhage again noted. Intraventricular extension with small amount of blood seen layering within the occipital horns, also unchanged. No other new intracranial hemorrhage or mass lesion identified. No acute large vessel territory infarct. No extra-axial fluid collection. Atrophy with chronic microvascular ischemic disease again noted. Vascular: No hyperdense vessel. Scattered vascular calcifications noted within the carotid siphons. Skull: Scalp soft tissues demonstrate no acute finding. Sequelae of prior bilateral burr hole craniotomy. Sinuses/Orbits: Globes orbital soft tissues within normal limits. Patient status post bilateral ocular lens replacement. Paranasal sinuses remain largely clear. Small bilateral maxillary sinus retention cyst noted. Small bilateral mastoid effusions noted. Other: None. IMPRESSION: 1. Relatively stable appearance of multifocal hemorrhagic masses with localized edema as above. No midline shift. 2. Associated small volume intraventricular hemorrhage, relatively unchanged. Stable ventricular size without hydrocephalus.  3. No other new acute intracranial abnormality. Electronically Signed   By: Jeannine Boga M.D.   On: 01/24/2018 02:17   Ct Head Wo Contrast  Result Date: 01/23/2018 CLINICAL DATA:  Initial evaluation for acute headache, recent intracranial hemorrhages. EXAM: CT HEAD WITHOUT CONTRAST TECHNIQUE: Contiguous axial images were obtained from the base of the skull through the vertex without intravenous contrast. COMPARISON:  Prior CT from 12/17/2017. FINDINGS: Brain: Since the previous exam, the right frontal intraparenchymal hemorrhage has enlarged in size now measuring 3.5 x 2.4 x 1.9 cm (series 2, image 25). Localized edema within this region overall slightly improved. Adjacent  cystic encephalomalacia of now contains a small amount of layering hemorrhage (series 2, image 22). Additional left frontal intraparenchymal hematoma also increased in size now measuring 3.1 x 2.5 x 2.5 cm (series 2, image 22, previously 1.8 cm). Localized vasogenic edema slightly worsened from previous. Additional right frontotemporal hemorrhage also increased in size now measuring 1.5 x 1.7 x 1.1 cm (series 2, image 17). Localized vasogenic edema has worsened without significant regional mass effect. Additional soft tissue density at the parasagittal aspect of the anterior inferior left frontal lobe measures 1.5 x 1.2 x 1.3 cm with mild localized edema without midline shift. This may have been present on prior CT, although difficult to see on prior exam. Finding also likely reflects a small intraparenchymal hemorrhage. New small volume intraventricular hemorrhage with blood seen layering within the occipital horns of both lateral ventricles, likely via extension from the right frontal ventricular size is relatively stable without interval hydrocephalus. Basilar cisterns remain patent. No other acute intracranial hemorrhage. No subarachnoid or extra-axial hemorrhage appreciated. No acute large vessel territory infarct. Underlying  cerebral atrophy, stable. Vascular: No hyperdense vessel. Scattered vascular calcifications noted within the carotid siphons. Skull: Scalp soft tissues demonstrate no acute abnormality. Sequelae of prior bilateral burr hole craniotomy. Sinuses/Orbits: Globes and orbital soft tissues within normal limits. Small bilateral maxillary sinus retention cyst noted. Paranasal sinuses are otherwise clear. Small chronic right mastoid effusion noted. Other: None. IMPRESSION: 1. Interval increase in size of bilateral intraparenchymal hematomas as above. Localized vasogenic edema without significant midline shift. 2. New small volume intraventricular hemorrhage, likely via extension from the right frontal hematoma and adjacent cystic encephalomalacia. Stable ventricular size without hydrocephalus or ventricular trapping at this time. Critical Value/emergent results were called by telephone at the time of interpretation on 01/23/2018 at 12:13 am to Dr. Rolland Porter , who verbally acknowledged these results. Electronically Signed   By: Jeannine Boga M.D.   On: 01/23/2018 00:18   Ct Chest W Contrast  Result Date: 01/24/2018 CLINICAL DATA:  Evaluate for metastatic disease. Initial cancer workup. EXAM: CT CHEST, ABDOMEN, AND PELVIS WITH CONTRAST TECHNIQUE: Multidetector CT imaging of the chest, abdomen and pelvis was performed following the standard protocol during bolus administration of intravenous contrast. CONTRAST:  137mL OMNIPAQUE IOHEXOL 300 MG/ML  SOLN COMPARISON:  None. FINDINGS: CT CHEST FINDINGS Cardiovascular: Heart size appears within normal limits. No pericardial effusion identified. Aortic atherosclerosis. Calcifications within the LAD and RCA coronary arteries noted. Mediastinum/Nodes: Normal appearance of the thyroid gland. The trachea appears patent and is midline. No axillary or supraclavicular adenopathy. Within the left posterior mediastinum there is a lymph node measuring 1.4 cm, image 37/3.  Lungs/Pleura: No pleural effusion, airspace consolidation or atelectasis. Pulmonary nodule within the posterior costophrenic sulcus measures 9 mm, image 124/5. Also within the posterior left costophrenic sulcus is a 8 mm lung nodule, image 115/5. Musculoskeletal: No chest wall mass or suspicious bone lesions identified. CT ABDOMEN PELVIS FINDINGS Hepatobiliary: No focal liver abnormality is seen. No gallstones, gallbladder wall thickening, or biliary dilatation. Pancreas: Cystic lesion arising from the tail of pancreas measures 1.9 by 2.2 cm, image 52/3. No inflammation or main duct dilatation. Spleen: Spleen is normal. Adrenals/Urinary Tract: Normal appearance of the right adrenal gland. Left adrenal nodule measures 4.4 by 3.3 by 5.0 cm and measures 46 HU. Normal appearance of the right kidney. Multiple exophytic cysts are noted arising from the upper and lower pole of left kidney. Indeterminate exophytic lesion arising from the anterior cortex of the interpolar left  kidney measures 1 cm and 49 HU, image 64/3. No hydronephrosis identified bilaterally. No focal bladder lesion identified. Stomach/Bowel: Stomach appears normal. The small bowel loops have a normal course and caliber. No pathologic dilatation of the colon. Distal colonic diverticula noted without acute inflammation. Vascular/Lymphatic: Aortic atherosclerosis. No aneurysm. No abdominopelvic adenopathy. Reproductive: Prostate is unremarkable. Other: No abdominal wall hernia or abnormality. No abdominopelvic ascites. Musculoskeletal: Spondylosis noted within the lumbar spine. No aggressive lytic or sclerotic bone lesion. IMPRESSION: 1. No definite primary neoplasm identified. Several nonspecific findings are noted within the chest in abdomen including a 5 cm left adrenal mass. This may represent a benign or malignant tumor. Metastatic disease not excluded. More definitive characterization could be obtained with contrast enhanced abdominal MRI. 2. Small  hyperdense left kidney lesion measures 1 cm and 49 HU. This may represent either and small enhancing neoplasm or hemorrhagic/proteinaceous cyst. This could also be further characterized with contrast enhanced abdominal MRI. 3. Single enlarged left posterior mediastinal lymph node measuring 1.4 cm. 4. Small exophytic cystic lesion arising from tail of pancreas measures 2.2 cm. Further characterization with contrast enhanced MR of the abdomen is advised. 5. 2 small nodules noted within the posterior costophrenic sulcus within the left lower lobe, nonspecific. 6. As an alternative to further imaging with contrast enhanced abdominal MRI a PET-CT may be considered to assess for areas of hypermetabolism which may help guide biopsy of any suspicious hypermetabolic lesions. Electronically Signed   By: Kerby Moors M.D.   On: 01/24/2018 21:32   Mr Jeri Cos EX Contrast  Result Date: 02/11/2018 CLINICAL DATA:  77 year old male with prior brain hemorrhages. History of atrial fibrillation, not on anticoagulation. Intracranial hemorrhages with follow-up MRI 01/23/2018 demonstrating multiple enhancing brain masses suggesting metastatic disease. Status post CT-guided biopsy of a left adrenal gland mass, pathology revealing Metastatic Melanoma. Treatment planning. EXAM: MRI HEAD WITHOUT AND WITH CONTRAST TECHNIQUE: Multiplanar, multiecho pulse sequences of the brain and surrounding structures were obtained without and with intravenous contrast. CONTRAST:  10 milliliters Gadavist COMPARISON:  01/23/2018. FINDINGS: Study is intermittently degraded by motion artifact despite repeated imaging attempts. Brain: Bilateral frontal lobe hemorrhagic and enhancing masses with only occasional intrinsic T1 hyperintensity. A total of SIX lesions are identified if the right superior frontal lobe adjacent cystic and solid masses are treated as two (versus alternatively five overall if you consider one conglomerate cystic/solid mass measuring  up to 4.3 centimeters as on series 13, image 30): - 7 millimeter round enhancing mass just anterior inferior to the right basal ganglia on series 12, image 99. - 19 millimeter right inferior frontal gyrus mass near the operculum on series 12, image 110. - 14 millimeter left anterior inferior frontal gyrus mass on series 12, image 123. - 27 millimeter left middle frontal gyrus mass just above the operculum on image 126. - 37 millimeter largely cystic, rim enhancing mass in the superior right frontal lobe on image 134. - adjacent 38 millimeter solid right superior frontal gyrus mass on image 148. Most of these have mildly increased in size since November (the lesions on images 126 and 134 are relatively stable). Vasogenic edema associated with each of the lesions has not significantly changed since November. Additionally there is a tiny indeterminate area on series 12, image 147 in the superior right parietal lobe which is only identified on the axial images and does not have associated hemosiderin. This is probably artifact. No definite new brain mass identified. Overlying changes of bilateral craniotomy and/or burr hole  placement which likely explains areas of mild dural thickening as postoperative in nature. Other areas of intracranial hemorrhage which are presumably benign. Persistent intraventricular hemorrhage layering in both occipital horns and along the right choroid plexus which is not abnormally enhancing. Superimposed superficial siderosis. No midline shift. Basilar cisterns remain patent. No restricted diffusion or evidence of acute infarction. Stable ventricle size. Negative pituitary and cervicomedullary junction. Vascular: Major intracranial vascular flow voids are preserved. Skull and upper cervical spine: Negative visible cervical spine and spinal cord. Postoperative changes to the skull. Visualized bone marrow signal is within normal limits. Sinuses/Orbits: Stable and negative orbits. Small  paranasal sinus mucous retention cysts are stable. Other: Mild mastoid effusions have regressed. No suspicious scalp or face soft tissue abnormality. IMPRESSION: 1. 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 No new lesions since November (see #2). All metastases are annotated on series 12. Most demonstrate mild enlargement since November, with the range of 7 to 38 mm diameter. Associated vasogenic edema has not significantly changed. No significant intracranial mass effect at this time. 2. Probable artifact in the superior right parietal lobe on series 12, image 147. Attention directed on follow-up. 3. 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 Electronically Signed   By: Genevie Ann M.D.   On: 02/11/2018 08:52   Mr Jeri Cos PY Contrast  Result Date: 01/23/2018 CLINICAL DATA:  Headaches.  Intracranial hemorrhages. EXAM: MRI HEAD WITHOUT AND WITH CONTRAST TECHNIQUE: Multiplanar, multiecho pulse sequences of the brain and surrounding structures were obtained without and with intravenous contrast. CONTRAST:  10 mL Gadavist COMPARISON:  Head CT 01/22/2018 and MRI 12/12/2017 FINDINGS: Brain: A hemorrhagic right frontal mass demonstrates solid enhancement and measures 3.2 x 2.7 cm (series 15, image 130). A cystic component or separate cystic lesion/cystic encephalomalacia inferior to the solid mass measures 4.2 x 2.6 cm and demonstrates a small amount of peripheral linear and nodular enhancement (series 15, image 116). A 1.7 cm hemorrhagic right frontal lobe mass anterior to the insula has enlarged from the prior MRI and now clearly enhances (series 15, image 90). A 1.2 cm enhancing mass in the anteromedial left frontal lobe is new or larger compared to the prior MRI (series 15, image 89). A 2.6 x 2.6 cm hemorrhagic lateral left frontal lobe mass has enlarged  from the prior MRI and now also clearly has solid enhancing components. A 6 mm ring-enhancing inferior right frontal lesion is new (series 15, image 79). Motion artifact could obscure additional small enhancing lesions. There is moderate vasogenic edema associated with the lateral left frontal mass with milder edema associated with other bilateral frontal masses. As seen on the recent head CT, there is a small amount of blood in the occipital horns of the lateral ventricles. There is also trace subarachnoid hemorrhage posteriorly in the sylvian fissures. There is not a significant chronic microhemorrhage burden to suggest cerebral amyloid angiopathy. There is moderate cerebral atrophy, and there is ex vacuo dilatation of the right frontal horn. There is no evidence of acute infarct, midline shift, or extra-axial fluid collection. Vascular: Major intracranial vascular flow voids are preserved. Skull and upper cervical spine: Bilateral burr holes related to prior subdural hematoma evacuation. No suspicious marrow lesion. Sinuses/Orbits: Bilateral cataract extraction. Small bilateral maxillary sinus mucous retention cysts. Small bilateral mastoid effusions. Other: None. IMPRESSION: 1. Multiple brain masses, many of which are hemorrhagic and now  clearly demonstrate enhancement. These are consistent with metastases. 2. Up to moderate associated vasogenic edema.  No midline shift. 3. Small volume intraventricular and subarachnoid hemorrhage. Electronically Signed   By: Logan Bores M.D.   On: 01/23/2018 17:51   Mr Abdomen W DJ Contrast  Result Date: 01/26/2018 CLINICAL DATA:  Inpatient. Multiple enhancing hemorrhagic bilateral cerebral masses compatible with metastatic disease of uncertain primary. Indeterminate left adrenal mass, left renal mass and cystic pancreatic tail mass on recent CT abdomen study. EXAM: MRI ABDOMEN WITHOUT AND WITH CONTRAST TECHNIQUE: Multiplanar multisequence MR imaging of the abdomen was  performed both before and after the administration of intravenous contrast. CONTRAST:  10 cc Gadavist IV. COMPARISON:  01/24/2018 CT chest, abdomen and pelvis. FINDINGS: Significantly motion degraded scan, limiting assessment. Lower chest: Enlarged 1.4 cm left lower posterior mediastinal para-aortic node (series 5/image 4), unchanged from 01/24/2018 CT. Hepatobiliary: Normal liver size and configuration. Mild diffuse hepatic steatosis. No liver mass. Normal gallbladder with no cholelithiasis. No biliary ductal dilatation. Common bile duct diameter 3 mm. No choledocholithiasis. Pancreas: There is a unilocular cystic pancreatic lesion measuring 2.4 x 1.8 x 2.0 cm arising exophytically from the anterior pancreatic tail (series 5/image 12), which demonstrates mild wall thickening and enhancement along the posterior margin, with no internal septations. Subcentimeter simple unilocular nonenhancing pancreatic body cystic lesion. No pancreatic duct dilation. No pancreas divisum. Spleen: Normal size. No mass. Adrenals/Urinary Tract: Normal right adrenal. Left adrenal 4.6 x 3.3 cm mass (series 8/image 18) demonstrates mildly heterogeneous signal intensity on T1 and T2 weighted sequences with heterogeneous enhancement and absence of consistent loss of signal intensity on out of phase chemical shift imaging. No hydronephrosis. Several simple renal cysts in both kidneys, largest 2.7 cm in the interpolar left kidney. There is a 1.4 x 1.1 cm mildly T2 hyperintense and T1 isointense renal cortical lesion in the anterior interpolar left kidney (series 8/image 22) without convincing enhancement on the limited motion degraded postcontrast sequences, probably a Bosniak category 2 hemorrhagic/proteinaceous renal cyst. No overtly suspicious renal masses. Stomach/Bowel: Normal non-distended stomach. Visualized small and large bowel is normal caliber, with no bowel wall thickening. Vascular/Lymphatic: Normal caliber abdominal aorta. Patent  portal, splenic, hepatic and renal veins. No pathologically enlarged lymph nodes in the abdomen. Other: No abdominal ascites or focal fluid collection. Musculoskeletal: No aggressive appearing focal osseous lesions. IMPRESSION: 1. Limited motion degraded scan. 2. Heterogeneous enhancing 4.6 cm left adrenal mass remains indeterminate by MRI, with no convincing intralesional lipid. Left adrenal metastasis cannot be excluded. Tissue sampling may be considered. 3. No overtly suspicious renal masses. Probable 1.4 cm Bosniak category 2 hemorrhagic/proteinaceous renal cyst in the anterior interpolar left kidney, for which follow-up MRI abdomen without and with IV contrast is recommended in 6-12 months given the limitations of this motion degraded scan. 4. 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 No pancreatic duct dilation. GI consultation for consideration of EUS/FNA is indicated. This recommendation follows ACR consensus guidelines: Management of Incidental Pancreatic Cysts: A White Paper of the ACR Incidental Findings Committee. [place] 2426;83:419-622. 5. Redemonstration of nonspecific mild left lower mediastinal adenopathy. 6. Mild diffuse hepatic steatosis. Electronically Signed   By: Ilona Sorrel M.D.   On: 01/26/2018 18:09   Ct Abdomen Pelvis W Contrast  Result Date: 01/24/2018 CLINICAL DATA:  Evaluate for metastatic disease. Initial cancer workup. EXAM: CT CHEST, ABDOMEN, AND PELVIS WITH CONTRAST TECHNIQUE: Multidetector CT imaging  of the chest, abdomen and pelvis was performed following the standard protocol during bolus administration of intravenous contrast. CONTRAST:  179mL OMNIPAQUE IOHEXOL 300 MG/ML  SOLN COMPARISON:  None. FINDINGS: CT CHEST FINDINGS Cardiovascular: Heart size appears within normal limits. No pericardial effusion identified. Aortic atherosclerosis. Calcifications within the  LAD and RCA coronary arteries noted. Mediastinum/Nodes: Normal appearance of the thyroid gland. The trachea appears patent and is midline. No axillary or supraclavicular adenopathy. Within the left posterior mediastinum there is a lymph node measuring 1.4 cm, image 37/3. Lungs/Pleura: No pleural effusion, airspace consolidation or atelectasis. Pulmonary nodule within the posterior costophrenic sulcus measures 9 mm, image 124/5. Also within the posterior left costophrenic sulcus is a 8 mm lung nodule, image 115/5. Musculoskeletal: No chest wall mass or suspicious bone lesions identified. CT ABDOMEN PELVIS FINDINGS Hepatobiliary: No focal liver abnormality is seen. No gallstones, gallbladder wall thickening, or biliary dilatation. Pancreas: Cystic lesion arising from the tail of pancreas measures 1.9 by 2.2 cm, image 52/3. No inflammation or main duct dilatation. Spleen: Spleen is normal. Adrenals/Urinary Tract: Normal appearance of the right adrenal gland. Left adrenal nodule measures 4.4 by 3.3 by 5.0 cm and measures 46 HU. Normal appearance of the right kidney. Multiple exophytic cysts are noted arising from the upper and lower pole of left kidney. Indeterminate exophytic lesion arising from the anterior cortex of the interpolar left kidney measures 1 cm and 49 HU, image 64/3. No hydronephrosis identified bilaterally. No focal bladder lesion identified. Stomach/Bowel: Stomach appears normal. The small bowel loops have a normal course and caliber. No pathologic dilatation of the colon. Distal colonic diverticula noted without acute inflammation. Vascular/Lymphatic: Aortic atherosclerosis. No aneurysm. No abdominopelvic adenopathy. Reproductive: Prostate is unremarkable. Other: No abdominal wall hernia or abnormality. No abdominopelvic ascites. Musculoskeletal: Spondylosis noted within the lumbar spine. No aggressive lytic or sclerotic bone lesion. IMPRESSION: 1. No definite primary neoplasm identified. Several  nonspecific findings are noted within the chest in abdomen including a 5 cm left adrenal mass. This may represent a benign or malignant tumor. Metastatic disease not excluded. More definitive characterization could be obtained with contrast enhanced abdominal MRI. 2. Small hyperdense left kidney lesion measures 1 cm and 49 HU. This may represent either and small enhancing neoplasm or hemorrhagic/proteinaceous cyst. This could also be further characterized with contrast enhanced abdominal MRI. 3. Single enlarged left posterior mediastinal lymph node measuring 1.4 cm. 4. Small exophytic cystic lesion arising from tail of pancreas measures 2.2 cm. Further characterization with contrast enhanced MR of the abdomen is advised. 5. 2 small nodules noted within the posterior costophrenic sulcus within the left lower lobe, nonspecific. 6. As an alternative to further imaging with contrast enhanced abdominal MRI a PET-CT may be considered to assess for areas of hypermetabolism which may help guide biopsy of any suspicious hypermetabolic lesions. Electronically Signed   By: Kerby Moors M.D.   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 Please perform CT-guided left adrenal gland mass biopsy for tissue diagnostic purposes. EXAM: CT-GUIDED BIOPSY INDETERMINATE LEFT ADRENAL GLAND MASS COMPARISON:  CT of the chest, abdomen and pelvis - 01/24/2018; abdominal MRI-01/26/2018 MEDICATIONS: None. ANESTHESIA/SEDATION: Fentanyl 50 mcg IV; Versed 1 mg IV Sedation time: 15 minutes; The patient was continuously monitored during the procedure by the interventional radiology nurse under my direct supervision. CONTRAST:  None. COMPLICATIONS: None immediate. PROCEDURE: Informed consent was obtained from the patient following  an explanation of the procedure, risks, benefits and alternatives. A time out was performed prior  to the initiation of the procedure. The patient was positioned prone on the CT table and a limited CT was performed for procedural planning demonstrating unchanged size and appearance of the at least 4.0 x 3.2 cm left adrenal gland mass (image 42, series 2). The procedure was planned. The operative site was prepped and draped in the usual sterile fashion. Appropriate trajectory was confirmed with a 22 gauge spinal needle after the adjacent tissues were anesthetized with 1% Lidocaine with epinephrine. Under intermittent CT guidance, a 17 gauge coaxial needle was advanced into the peripheral aspect of the mass. Appropriate positioning was confirmed and 4 core needle biopsy samples were obtained with an 18 gauge core needle biopsy device. The co-axial needle was removed following the administration of a Gel-Foam slurry and superficial hemostasis was achieved with manual compression. A limited postprocedural CT was negative for significant peri biopsy hemorrhage or additional complication. A dressing was placed. The patient tolerated the procedure well without immediate postprocedural complication. IMPRESSION: Technically successful CT guided core needle biopsy of indeterminate left adrenal gland mass. Electronically Signed   By: Sandi Mariscal M.D.   On: 01/29/2018 12:22     PERTINENT LAB RESULTS: CBC: Recent Labs    02/13/18 2309 02/14/18 0541  WBC 10.0 11.3*  HGB 15.8 14.5  HCT 47.6 43.8  PLT 134* 133*   CMET CMP     Component Value Date/Time   NA 136 02/14/2018 0541   NA 144 05/13/2017 1105   K 4.1 02/14/2018 0541   CL 102 02/14/2018 0541   CO2 21 (L) 02/14/2018 0541   GLUCOSE 189 (H) 02/14/2018 0541   BUN 17 02/14/2018 0541   BUN 11 05/13/2017 1105   CREATININE 1.30 (H) 02/14/2018 0541   CREATININE 1.01 02/12/2018 1354   CALCIUM 8.5 (L) 02/14/2018 0541   PROT 6.2 (L) 02/14/2018 0541   PROT 6.7 05/13/2017 1105   ALBUMIN 3.1 (L) 02/14/2018 0541   ALBUMIN 4.1 05/13/2017 1105   AST 25  02/14/2018 0541   AST 17 02/12/2018 1354   ALT 39 02/14/2018 0541   ALT 34 02/12/2018 1354   ALKPHOS 47 02/14/2018 0541   BILITOT 0.8 02/14/2018 0541   BILITOT 0.7 02/12/2018 1354   GFRNONAA 53 (L) 02/14/2018 0541   GFRNONAA >60 02/12/2018 1354   GFRAA >60 02/14/2018 0541   GFRAA >60 02/12/2018 1354    GFR Estimated Creatinine Clearance: 62.2 mL/min (A) (by C-G formula based on SCr of 1.3 mg/dL (H)). No results for input(s): LIPASE, AMYLASE in the last 72 hours. No results for input(s): CKTOTAL, CKMB, CKMBINDEX, TROPONINI in the last 72 hours. Invalid input(s): POCBNP No results for input(s): DDIMER in the last 72 hours. No results for input(s): HGBA1C in the last 72 hours. No results for input(s): CHOL, HDL, LDLCALC, TRIG, CHOLHDL, LDLDIRECT in the last 72 hours. Recent Labs    02/14/18 0541  TSH 1.138   No results for input(s): VITAMINB12, FOLATE, FERRITIN, TIBC, IRON, RETICCTPCT in the last 72 hours. Coags: No results for input(s): INR in the last 72 hours.  Invalid input(s): PT Microbiology: Recent Results (from the past 240 hour(s))  Culture, Urine     Status: Abnormal   Collection Time: 02/12/18  1:55 PM  Result Value Ref Range Status   Specimen Description   Final    URINE, CLEAN CATCH Performed at [facility], [address] [place],  [place] [address]    Special Requests   Final    NONE Performed at [facility], [place] [address], [place], [place] [address]    Culture >=100,000 COLONIES/mL PROTEUS MIRABILIS (A)  Final   Report Status 02/14/2018 FINAL  Final   Organism ID, Bacteria PROTEUS MIRABILIS (A)  Final      Susceptibility   Proteus mirabilis - MIC*    AMPICILLIN >=32 RESISTANT Resistant     CEFAZOLIN >=64 RESISTANT Resistant     CEFTRIAXONE RESISTANT Resistant     CIPROFLOXACIN <=0.25 SENSITIVE Sensitive     GENTAMICIN <=1 SENSITIVE Sensitive     IMIPENEM 4 SENSITIVE Sensitive     NITROFURANTOIN  128 RESISTANT Resistant     TRIMETH/SULFA <=20 SENSITIVE Sensitive     AMPICILLIN/SULBACTAM >=32 RESISTANT Resistant     PIP/TAZO <=4 SENSITIVE Sensitive     * >=100,000 COLONIES/mL PROTEUS MIRABILIS  Urine culture     Status: Abnormal (Preliminary result)   Collection Time: 02/14/18  2:21 AM  Result Value Ref Range Status   Specimen Description URINE, RANDOM  Final   Special Requests NONE  Final   Culture (A)  Final    40,000 COLONIES/mL PROTEUS MIRABILIS SUSCEPTIBILITIES TO FOLLOW Performed at [facility], [place] [address], [place], [place] [address]    Report Status PENDING  Incomplete    FURTHER DISCHARGE INSTRUCTIONS:  Get Medicines reviewed and adjusted: Please take all your medications with you for your next visit with your Primary MD  Laboratory/radiological data: Please request your Primary MD to go over all hospital tests and procedure/radiological results at the follow up, please ask your Primary MD to get all Hospital records sent to his/her office.  In some cases, they will be blood work, cultures and biopsy results pending at the time of your discharge. Please request that your primary care M.D. goes through all the records of your hospital data and follows up on these results.  Also Note the following: If you experience worsening of your admission symptoms, develop shortness of breath, life threatening emergency, suicidal or homicidal thoughts you must seek medical attention immediately by calling 911 or calling your MD immediately  if symptoms less severe.  You must read complete instructions/literature along with all the possible adverse reactions/side effects for all the Medicines you take and that have been prescribed to you. Take any new Medicines after you have completely understood and accpet all the possible adverse reactions/side effects.   Do not drive when taking Pain medications or sleeping medications (Benzodaizepines)  Do not take more than  prescribed Pain, Sleep and Anxiety Medications. It is not advisable to combine anxiety,sleep and pain medications without talking with your primary care practitioner  Special Instructions: If you have smoked or chewed Tobacco  in the last 2 yrs please stop smoking, stop any regular Alcohol  and or any Recreational drug use.  Wear Seat belts while driving.  Please note: You were cared for by a hospitalist during your hospital stay. Once you are discharged, your primary care physician will handle any further medical issues. Please note that NO REFILLS for any discharge medications will be authorized once you are discharged, as it is imperative that you return to your primary care physician (or establish a relationship with a primary care physician if you do not have one) for your post hospital discharge needs so that they can reassess your need for medications and monitor your lab values.  Total Time spent coordinating  discharge including counseling, education and face to face time equals 35 minutes.  SignedOren Binet 02/16/2018 10:45 AM

## 2018-02-16 NOTE — Evaluation (Signed)
Physical Therapy Evaluation Patient Details Name: [name] [name] MRN: [id number] DOB: Nov 14, 1940 Today's Date: 02/16/2018   History of Present Illness  77 yo male admitted with acute toxic encephalopathy. Hx of metastatic melanoma, A fib, SAH, legally blind  Clinical Impression  On eval, pt required Min assist for mobility. He walked ~100 feet while holding on to hallway handrail or "furniture walking." He is unsteady and generally weak. Family was present during session. Plan is for pt to d/c home. Recommend HHPT f/u and RW use for safe ambulation until pt returns to baseline.     Follow Up Recommendations Home health PT;Supervision/Assistance - 24 hour    Equipment Recommendations  None recommended by PT    Recommendations for Other Services       Precautions / Restrictions Precautions Precautions: Fall Restrictions Weight Bearing Restrictions: No      Mobility  Bed Mobility               General bed mobility comments: oob in recliner  Transfers Overall transfer level: Needs assistance   Transfers: Sit to/from Stand Sit to Stand: Min assist         General transfer comment: Assist to rise, stabilize.   Ambulation/Gait Ambulation/Gait assistance: Min assist Gait Distance (Feet): 100 Feet Assistive device: (hallway handrail)       General Gait Details: Intermittent assist to steady. Slow gait speed.   Stairs            Wheelchair Mobility    Modified Rankin (Stroke Patients Only)       Balance Overall balance assessment: Mild deficits observed, not formally tested                                           Pertinent Vitals/Pain Pain Assessment: No/denies pain    Home Living Family/patient expects to be discharged to:: Private residence Living Arrangements: Spouse/significant other Available Help at Discharge: Family;Available 24 hours/day Type of Home: House Home Access: Ramped entrance     Home Layout: One  level Home Equipment: Walker - 2 wheels;Cane - single point;Bedside commode;Shower seat Additional Comments: he and his wife have custody of great grandchildren ages 88,6, and 69 y.o.  Wife has had multiple knee surgeries, and requires assist for IADLs     Prior Function Level of Independence: Independent               Hand Dominance        Extremity/Trunk Assessment   Upper Extremity Assessment Upper Extremity Assessment: Generalized weakness    Lower Extremity Assessment Lower Extremity Assessment: Generalized weakness    Cervical / Trunk Assessment Cervical / Trunk Assessment: Normal  Communication   Communication: No difficulties  Cognition Arousal/Alertness: Awake/alert Behavior During Therapy: WFL for tasks assessed/performed Overall Cognitive Status: Impaired/Different from baseline                       Memory: Decreased short-term memory       Problem Solving: Slow processing        General Comments      Exercises     Assessment/Plan    PT Assessment Patient needs continued PT services  PT Problem List Decreased balance;Decreased mobility;Decreased activity tolerance;Decreased cognition       PT Treatment Interventions DME instruction;Gait training;Functional mobility training;Therapeutic activities;Balance training;Patient/family education;Therapeutic exercise    PT Goals (Current goals  can be found in the Care Plan section)  Acute Rehab PT Goals Patient Stated Goal: to get better  PT Goal Formulation: All assessment and education complete, DC therapy    Frequency Min 3X/week   Barriers to discharge        Co-evaluation               AM-PAC PT "6 Clicks" Mobility  Outcome Measure Help needed turning from your back to your side while in a flat bed without using bedrails?: A Little Help needed moving from lying on your back to sitting on the side of a flat bed without using bedrails?: A Little Help needed moving to and  from a bed to a chair (including a wheelchair)?: A Little Help needed standing up from a chair using your arms (e.g., wheelchair or bedside chair)?: A Little Help needed to walk in hospital room?: A Little Help needed climbing 3-5 steps with a railing? : A Little 6 Click Score: 18    End of Session Equipment Utilized During Treatment: Gait belt Activity Tolerance: Patient tolerated treatment well Patient left: in chair;with family/visitor present   PT Visit Diagnosis: Unsteadiness on feet (R26.81);Muscle weakness (generalized) (M62.81)    Time: 6433-2951 PT Time Calculation (min) (ACUTE ONLY): 35 min   Charges:   PT Evaluation $PT Eval Moderate Complexity: 1 Mod PT Treatments $Gait Training: 8-22 mins        Weston [name], PT Acute Rehabilitation Services Pager: [phone] Office: [phone]

## 2018-02-16 NOTE — Progress Notes (Signed)
Patient discharged to home with family, discharge instructions reviewed with patient and wife who verbalized understanding. New RX was sent to pharmacy electronically.

## 2018-02-16 NOTE — Care Management Note (Signed)
Case Management Note  Patient Details  Name: ZOLTON DOWSON MRN: [id number] Date of Birth: 27-Oct-1940  Subjective/Objective:    Acute encephalopathy, melanoma with brain mets                Action/Plan: NCM spoke to pt and he is active with [facility] for [facility]. CMS list provided and placed on chart. He has RW at home. Contacted [facility] with resumption of care.   Expected Discharge Date:  02/16/18               Expected Discharge Plan:  [place]  In-House Referral:  NA  Discharge planning Services  CM Consult  Post Acute Care Choice:  Home Health Choice offered to:  Patient  DME Arranged:  N/A DME Agency:  NA  HH Arranged:  PT, Speech Therapy HH Agency:  [place]  Status of Service:  Completed, signed off  If discussed at [place] of Stay Meetings, dates discussed:    Additional Comments:  Erenest Rasher, RN 02/16/2018, 12:49 PM

## 2018-02-17 ENCOUNTER — Telehealth: Payer: Self-pay | Admitting: *Deleted

## 2018-02-17 ENCOUNTER — Ambulatory Visit
Admission: RE | Admit: 2018-02-17 | Discharge: 2018-02-17 | Disposition: A | Discharge status: Home / Self Care | Payer: PPO | Source: Ambulatory Visit | Attending: Radiation Oncology | Admitting: Radiation Oncology

## 2018-02-17 VITALS — BP 156/91 | HR 70 | Temp 97.6°F

## 2018-02-17 DIAGNOSIS — I609 Nontraumatic subarachnoid hemorrhage, unspecified: Secondary | ICD-10-CM | POA: Diagnosis not present

## 2018-02-17 DIAGNOSIS — Z961 Presence of intraocular lens: Secondary | ICD-10-CM | POA: Diagnosis present

## 2018-02-17 DIAGNOSIS — G936 Cerebral edema: Secondary | ICD-10-CM | POA: Diagnosis present

## 2018-02-17 DIAGNOSIS — R945 Abnormal results of liver function studies: Secondary | ICD-10-CM | POA: Diagnosis not present

## 2018-02-17 DIAGNOSIS — Z9842 Cataract extraction status, left eye: Secondary | ICD-10-CM | POA: Diagnosis not present

## 2018-02-17 DIAGNOSIS — E039 Hypothyroidism, unspecified: Secondary | ICD-10-CM | POA: Diagnosis not present

## 2018-02-17 DIAGNOSIS — R0689 Other abnormalities of breathing: Secondary | ICD-10-CM | POA: Diagnosis not present

## 2018-02-17 DIAGNOSIS — Z825 Family history of asthma and other chronic lower respiratory diseases: Secondary | ICD-10-CM

## 2018-02-17 DIAGNOSIS — R0902 Hypoxemia: Secondary | ICD-10-CM | POA: Diagnosis not present

## 2018-02-17 DIAGNOSIS — E785 Hyperlipidemia, unspecified: Secondary | ICD-10-CM | POA: Diagnosis present

## 2018-02-17 DIAGNOSIS — E7439 Other disorders of intestinal carbohydrate absorption: Secondary | ICD-10-CM | POA: Diagnosis not present

## 2018-02-17 DIAGNOSIS — B964 Proteus (mirabilis) (morganii) as the cause of diseases classified elsewhere: Secondary | ICD-10-CM | POA: Diagnosis present

## 2018-02-17 DIAGNOSIS — T380X5A Adverse effect of glucocorticoids and synthetic analogues, initial encounter: Secondary | ICD-10-CM | POA: Diagnosis present

## 2018-02-17 DIAGNOSIS — R1310 Dysphagia, unspecified: Secondary | ICD-10-CM | POA: Diagnosis present

## 2018-02-17 DIAGNOSIS — E871 Hypo-osmolality and hyponatremia: Secondary | ICD-10-CM | POA: Diagnosis not present

## 2018-02-17 DIAGNOSIS — H548 Legal blindness, as defined in USA: Secondary | ICD-10-CM | POA: Diagnosis present

## 2018-02-17 DIAGNOSIS — Z8249 Family history of ischemic heart disease and other diseases of the circulatory system: Secondary | ICD-10-CM

## 2018-02-17 DIAGNOSIS — I615 Nontraumatic intracerebral hemorrhage, intraventricular: Secondary | ICD-10-CM | POA: Diagnosis not present

## 2018-02-17 DIAGNOSIS — G4089 Other seizures: Secondary | ICD-10-CM | POA: Diagnosis present

## 2018-02-17 DIAGNOSIS — R569 Unspecified convulsions: Secondary | ICD-10-CM | POA: Diagnosis not present

## 2018-02-17 DIAGNOSIS — S065X9A Traumatic subdural hemorrhage with loss of consciousness of unspecified duration, initial encounter: Secondary | ICD-10-CM | POA: Diagnosis not present

## 2018-02-17 DIAGNOSIS — Z9841 Cataract extraction status, right eye: Secondary | ICD-10-CM

## 2018-02-17 DIAGNOSIS — K760 Fatty (change of) liver, not elsewhere classified: Secondary | ICD-10-CM | POA: Diagnosis present

## 2018-02-17 DIAGNOSIS — C439 Malignant melanoma of skin, unspecified: Secondary | ICD-10-CM | POA: Diagnosis not present

## 2018-02-17 DIAGNOSIS — Z66 Do not resuscitate: Secondary | ICD-10-CM | POA: Diagnosis not present

## 2018-02-17 DIAGNOSIS — Z9049 Acquired absence of other specified parts of digestive tract: Secondary | ICD-10-CM

## 2018-02-17 DIAGNOSIS — J45909 Unspecified asthma, uncomplicated: Secondary | ICD-10-CM | POA: Diagnosis not present

## 2018-02-17 DIAGNOSIS — D72829 Elevated white blood cell count, unspecified: Secondary | ICD-10-CM | POA: Diagnosis not present

## 2018-02-17 DIAGNOSIS — C7931 Secondary malignant neoplasm of brain: Secondary | ICD-10-CM

## 2018-02-17 DIAGNOSIS — Z79899 Other long term (current) drug therapy: Secondary | ICD-10-CM

## 2018-02-17 DIAGNOSIS — E1165 Type 2 diabetes mellitus with hyperglycemia: Secondary | ICD-10-CM | POA: Diagnosis not present

## 2018-02-17 DIAGNOSIS — R000 Tachycardia, unspecified: Secondary | ICD-10-CM | POA: Diagnosis not present

## 2018-02-17 DIAGNOSIS — I10 Essential (primary) hypertension: Secondary | ICD-10-CM | POA: Diagnosis not present

## 2018-02-17 DIAGNOSIS — Z79890 Hormone replacement therapy (postmenopausal): Secondary | ICD-10-CM

## 2018-02-17 DIAGNOSIS — Z8261 Family history of arthritis: Secondary | ICD-10-CM

## 2018-02-17 DIAGNOSIS — Z923 Personal history of irradiation: Secondary | ICD-10-CM

## 2018-02-17 DIAGNOSIS — G9340 Encephalopathy, unspecified: Secondary | ICD-10-CM | POA: Diagnosis not present

## 2018-02-17 DIAGNOSIS — I480 Paroxysmal atrial fibrillation: Secondary | ICD-10-CM | POA: Diagnosis present

## 2018-02-17 DIAGNOSIS — Z96653 Presence of artificial knee joint, bilateral: Secondary | ICD-10-CM | POA: Diagnosis present

## 2018-02-17 DIAGNOSIS — N390 Urinary tract infection, site not specified: Secondary | ICD-10-CM | POA: Diagnosis not present

## 2018-02-17 DIAGNOSIS — R4182 Altered mental status, unspecified: Secondary | ICD-10-CM | POA: Diagnosis not present

## 2018-02-17 DIAGNOSIS — Z7952 Long term (current) use of systemic steroids: Secondary | ICD-10-CM

## 2018-02-17 DIAGNOSIS — Z821 Family history of blindness and visual loss: Secondary | ICD-10-CM

## 2018-02-17 DIAGNOSIS — Z7984 Long term (current) use of oral hypoglycemic drugs: Secondary | ICD-10-CM

## 2018-02-17 DIAGNOSIS — Z87440 Personal history of urinary (tract) infections: Secondary | ICD-10-CM

## 2018-02-17 DIAGNOSIS — R5381 Other malaise: Secondary | ICD-10-CM | POA: Diagnosis not present

## 2018-02-17 LAB — URINE CULTURE

## 2018-02-17 NOTE — Telephone Encounter (Signed)
Call Completed and Appointment Scheduled: Yes, Date: 07-Mar-2018 with Dr Dettinger   DISCHARGE INFORMATION Date of Discharge: 02/16/2018  Discharge Facility: [place]  Principal Discharge Diagnosis: UTI  Patient and/or caregiver is knowledgeable of his/her condition(s) and treatment: Yes  MEDICATION RECONCILIATION Current medication list reviewed with patient:Yes  Outpatient Encounter Medications as of 02/17/2018  Medication Sig  . albuterol (PROVENTIL HFA;VENTOLIN HFA) 108 (90 Base) MCG/ACT inhaler Inhale 2 puffs into the lungs every 6 (six) hours as needed for wheezing or shortness of breath.  Marland Kitchen atorvastatin (LIPITOR) 10 MG tablet Take 1 tablet (10 mg total) by mouth daily.  . ciprofloxacin (CIPRO) 500 MG tablet Take 1 tablet (500 mg total) by mouth 2 (two) times daily for 2 days.  Marland Kitchen dexamethasone (DECADRON) 4 MG tablet Take 1 tablet (4 mg total) by mouth daily.  . flecainide (TAMBOCOR) 100 MG tablet TAKE 1 TABLET (100 MG TOTAL) BY MOUTH 2 (TWO) TIMES DAILY. (Patient taking differently: Take 100 mg by mouth 2 (two) times daily. )  . levothyroxine (SYNTHROID, LEVOTHROID) 50 MCG tablet TAKE 2 TABLETS BY MOUTH DAILY (Patient taking differently: Take 100 mcg by mouth daily before breakfast. )  . metFORMIN (GLUCOPHAGE-XR) 500 MG 24 hr tablet Take 1 tablet (500 mg total) by mouth daily with breakfast. (Need to be seen)   No facility-administered encounter medications on file as of 02/17/2018.     Discharge Medications reviewed and reconciled with current medications.yes. Taking Cipro 500mg  BID. No other medication changes.   Patient is able to obtain needed medications:Yes  ACTIVITIES OF DAILY LIVING  Is the patient able to perform his/her own ADLs: Yes.    Patient is receiving home health services: Yes.    PATIENT EDUCATION Questions/Concerns Discussed: Patient aware to f/u sooner if needed. No concerns at this time.   Chong Sicilian, RN-BC, BSN Nurse Case Manager [facility] Ph: [phone]

## 2018-02-18 ENCOUNTER — Encounter (HOSPITAL_COMMUNITY): Payer: Self-pay | Admitting: Emergency Medicine

## 2018-02-18 ENCOUNTER — Emergency Department (HOSPITAL_COMMUNITY): Payer: PPO

## 2018-02-18 ENCOUNTER — Other Ambulatory Visit: Payer: Self-pay

## 2018-02-18 ENCOUNTER — Inpatient Hospital Stay (HOSPITAL_COMMUNITY)
Admission: EM | Admit: 2018-02-18 | Discharge: 2018-02-26 | DRG: 054 | Disposition: E | Payer: PPO | Attending: Internal Medicine | Admitting: Internal Medicine

## 2018-02-18 DIAGNOSIS — C7931 Secondary malignant neoplasm of brain: Secondary | ICD-10-CM | POA: Diagnosis not present

## 2018-02-18 DIAGNOSIS — R945 Abnormal results of liver function studies: Secondary | ICD-10-CM

## 2018-02-18 DIAGNOSIS — N390 Urinary tract infection, site not specified: Secondary | ICD-10-CM | POA: Diagnosis not present

## 2018-02-18 DIAGNOSIS — H548 Legal blindness, as defined in USA: Secondary | ICD-10-CM | POA: Diagnosis not present

## 2018-02-18 DIAGNOSIS — I480 Paroxysmal atrial fibrillation: Secondary | ICD-10-CM | POA: Diagnosis not present

## 2018-02-18 DIAGNOSIS — E7439 Other disorders of intestinal carbohydrate absorption: Secondary | ICD-10-CM

## 2018-02-18 DIAGNOSIS — E871 Hypo-osmolality and hyponatremia: Secondary | ICD-10-CM | POA: Diagnosis present

## 2018-02-18 DIAGNOSIS — R1310 Dysphagia, unspecified: Secondary | ICD-10-CM | POA: Diagnosis not present

## 2018-02-18 DIAGNOSIS — G936 Cerebral edema: Secondary | ICD-10-CM | POA: Diagnosis not present

## 2018-02-18 DIAGNOSIS — E039 Hypothyroidism, unspecified: Secondary | ICD-10-CM | POA: Diagnosis present

## 2018-02-18 DIAGNOSIS — S065X9A Traumatic subdural hemorrhage with loss of consciousness of unspecified duration, initial encounter: Secondary | ICD-10-CM | POA: Diagnosis present

## 2018-02-18 DIAGNOSIS — R7989 Other specified abnormal findings of blood chemistry: Secondary | ICD-10-CM

## 2018-02-18 DIAGNOSIS — I10 Essential (primary) hypertension: Secondary | ICD-10-CM | POA: Diagnosis present

## 2018-02-18 DIAGNOSIS — R569 Unspecified convulsions: Secondary | ICD-10-CM

## 2018-02-18 DIAGNOSIS — S065XAA Traumatic subdural hemorrhage with loss of consciousness status unknown, initial encounter: Secondary | ICD-10-CM | POA: Diagnosis present

## 2018-02-18 DIAGNOSIS — R5381 Other malaise: Secondary | ICD-10-CM

## 2018-02-18 DIAGNOSIS — D72829 Elevated white blood cell count, unspecified: Secondary | ICD-10-CM

## 2018-02-18 LAB — HEPATIC FUNCTION PANEL
ALK PHOS: 53 U/L (ref 38–126)
ALT: 62 U/L — ABNORMAL HIGH (ref 0–44)
AST: 30 U/L (ref 15–41)
Albumin: 3.8 g/dL (ref 3.5–5.0)
Bilirubin, Direct: 0.3 mg/dL — ABNORMAL HIGH (ref 0.0–0.2)
Indirect Bilirubin: 1.1 mg/dL — ABNORMAL HIGH (ref 0.3–0.9)
TOTAL PROTEIN: 7.4 g/dL (ref 6.5–8.1)
Total Bilirubin: 1.4 mg/dL — ABNORMAL HIGH (ref 0.3–1.2)

## 2018-02-18 LAB — CBC WITH DIFFERENTIAL/PLATELET
Abs Immature Granulocytes: 0.1 10*3/uL — ABNORMAL HIGH (ref 0.00–0.07)
Basophils Absolute: 0 10*3/uL (ref 0.0–0.1)
Basophils Relative: 0 %
Eosinophils Absolute: 0 10*3/uL (ref 0.0–0.5)
Eosinophils Relative: 0 %
HCT: 50.2 % (ref 39.0–52.0)
HEMOGLOBIN: 16.7 g/dL (ref 13.0–17.0)
Immature Granulocytes: 1 %
Lymphocytes Relative: 9 %
Lymphs Abs: 1.3 10*3/uL (ref 0.7–4.0)
MCH: 28.6 pg (ref 26.0–34.0)
MCHC: 33.3 g/dL (ref 30.0–36.0)
MCV: 86 fL (ref 80.0–100.0)
Monocytes Absolute: 1.4 10*3/uL — ABNORMAL HIGH (ref 0.1–1.0)
Monocytes Relative: 9 %
Neutro Abs: 12.8 10*3/uL — ABNORMAL HIGH (ref 1.7–7.7)
Neutrophils Relative %: 81 %
Platelets: 217 10*3/uL (ref 150–400)
RBC: 5.84 MIL/uL — AB (ref 4.22–5.81)
RDW: 13.4 % (ref 11.5–15.5)
WBC: 15.6 10*3/uL — ABNORMAL HIGH (ref 4.0–10.5)
nRBC: 0 % (ref 0.0–0.2)

## 2018-02-18 LAB — BASIC METABOLIC PANEL
Anion gap: 13 (ref 5–15)
BUN: 20 mg/dL (ref 8–23)
CO2: 21 mmol/L — ABNORMAL LOW (ref 22–32)
Calcium: 9.3 mg/dL (ref 8.9–10.3)
Chloride: 98 mmol/L (ref 98–111)
Creatinine, Ser: 1.11 mg/dL (ref 0.61–1.24)
GFR calc Af Amer: >60 mL/min (ref >60)
GFR calc non Af Amer: >60 mL/min (ref >60)
Glucose, Bld: 145 mg/dL — ABNORMAL HIGH (ref 70–99)
Potassium: 3.6 mmol/L (ref 3.5–5.1)
Sodium: 132 mmol/L — ABNORMAL LOW (ref 135–145)

## 2018-02-18 LAB — CBG MONITORING, ED: Glucose-Capillary: 142 mg/dL — ABNORMAL HIGH (ref 70–99)

## 2018-02-18 LAB — GLUCOSE, CAPILLARY: Glucose-Capillary: 130 mg/dL — ABNORMAL HIGH (ref 70–99)

## 2018-02-18 MED ORDER — LEVOTHYROXINE SODIUM 100 MCG/5ML IV SOLN
50.0000 ug | Freq: Every day | INTRAVENOUS | Status: DC
  Administered 2018-02-20 – 2018-02-21 (×2): 50 ug via INTRAVENOUS
  Filled 2018-02-18 (×4): qty 5

## 2018-02-18 MED ORDER — INSULIN ASPART 100 UNIT/ML ~~LOC~~ SOLN
0.0000 [IU] | Freq: Three times a day (TID) | SUBCUTANEOUS | Status: DC
  Administered 2018-02-20 – 2018-02-21 (×4): 1 [IU] via SUBCUTANEOUS
  Administered 2018-02-22 – 2018-02-23 (×6): 2 [IU] via SUBCUTANEOUS

## 2018-02-18 MED ORDER — SODIUM CHLORIDE 0.9 % IV SOLN
INTRAVENOUS | Status: AC
  Administered 2018-02-18 – 2018-02-19 (×2): via INTRAVENOUS

## 2018-02-18 MED ORDER — HYDRALAZINE HCL 20 MG/ML IJ SOLN
5.0000 mg | INTRAMUSCULAR | Status: DC | PRN
  Administered 2018-02-18: 5 mg via INTRAVENOUS
  Filled 2018-02-18: qty 1

## 2018-02-18 MED ORDER — DEXAMETHASONE SODIUM PHOSPHATE 10 MG/ML IJ SOLN
4.0000 mg | Freq: Four times a day (QID) | INTRAMUSCULAR | Status: DC
  Administered 2018-02-18 – 2018-02-23 (×22): 4 mg via INTRAVENOUS
  Filled 2018-02-18 (×23): qty 1

## 2018-02-18 MED ORDER — ALBUTEROL SULFATE (2.5 MG/3ML) 0.083% IN NEBU: 2.5000 mg | INHALATION_SOLUTION | Freq: Four times a day (QID) | RESPIRATORY_TRACT | Status: DC | PRN

## 2018-02-18 MED ORDER — SODIUM CHLORIDE 0.9 % IV SOLN
INTRAVENOUS | Status: DC | PRN
  Administered 2018-02-18 – 2018-02-19 (×2): via INTRAVENOUS

## 2018-02-18 MED ORDER — LEVETIRACETAM IN NACL 1000 MG/100ML IV SOLN
1000.0000 mg | Freq: Once | INTRAVENOUS | Status: AC
  Administered 2018-02-18: 1000 mg via INTRAVENOUS
  Filled 2018-02-18: qty 100

## 2018-02-18 MED ORDER — LEVETIRACETAM IN NACL 500 MG/100ML IV SOLN
500.0000 mg | Freq: Two times a day (BID) | INTRAVENOUS | Status: DC
  Administered 2018-02-19 – 2018-02-21 (×5): 500 mg via INTRAVENOUS
  Filled 2018-02-18 (×8): qty 100

## 2018-02-18 NOTE — ED Triage Notes (Signed)
EMS called out to pt's residence today for c/o generalized weakness. Upon EMS arrival pt had what appeared to be a focal seizure that lasted approx 4 minutes. PT alert and oriented but talks a long time to respond appropriately. Family reports recent start of radiation to brain for multiple brain tumors and denies any hx of seizures.

## 2018-02-18 NOTE — ED Provider Notes (Signed)
[facility] EMERGENCY DEPARTMENT Provider Note   CSN: [id number] Arrival date & time: 01/27/2018  1407     History   Chief Complaint Chief Complaint  Patient presents with  . Seizures    HPI [name] [name] is a 77 y.o. male.  Patient had 1 or 2 seizures today.  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  The history is provided by a relative. No language interpreter was used.  Seizures   This is a new problem. The current episode started 3 to 5 hours ago. The problem has been resolved. There were 2 to 3 seizures. Associated symptoms include sleepiness. Characteristics do not include eye blinking. The episode was not witnessed. There was no sensation of an aura present. The seizures did not continue in the ED. The seizure(s) had no focality. There has been no fever.    Past Medical History:  Diagnosis Date  . Arthritis   . Atrial fibrillation ([place])    previously on Coumadin, reversed with head bleed  . Cataract   . Diabetes mellitus without complication ([place])   . Hyperlipidemia   . HYPERTENSION   . HYPOTHYROIDISM   . 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 Melanoma ([place])    metastatic melanoma with multiple brain mets  . Pre-diabetes   . Precancerous skin lesion    on back  . ROTATOR CUFF TEAR   . Subdural hematoma [facility])     Patient Active Problem List   Diagnosis Date Noted  . Acute encephalopathy 02/14/2018  . Acute lower UTI 02/14/2018  . Brain metastases ([place])   . Lesion of adrenal gland ([place])   . Precancerous skin lesion   . ICH (intracerebral hemorrhage) ([place]) 01/23/2018  . Warfarin anticoagulation   . H/O subdural hemorrhage   . History of burr hole surgery   . Hyponatremia 02/05/2017  . Somnolence 02/05/2017  . Headache 01/28/2017  . 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 Pre-diabetes 09/14/2015  . Paroxysmal atrial fibrillation ([place]) 03/30/2015  . HLD (hyperlipidemia) 03/02/2015  . Hypothyroidism 06/26/2008    . Essential hypertension 06/26/2008  . ROTATOR CUFF TEAR 06/26/2008    Past Surgical History:  Procedure Laterality Date  . APPENDECTOMY    . CATARACT EXTRACTION W/PHACO Right 02/02/2014   Procedure: CATARACT EXTRACTION PHACO AND INTRAOCULAR LENS PLACEMENT (IOC);  Surgeon: Elta Guadeloupe T. Gershon Crane, MD;  Location: [facility];  Service: Ophthalmology;  Laterality: Right;  CDE 12.17  . CATARACT EXTRACTION W/PHACO Left 02/16/2014   Procedure: CATARACT EXTRACTION PHACO AND INTRAOCULAR LENS PLACEMENT ;  Surgeon: Elta Guadeloupe T. Gershon Crane, MD;  Location: [facility];  Service: Ophthalmology;  Laterality: Left;  CDE:13.42  . CRANIOTOMY N/A 02/05/2017   Procedure: Bilateral Burr Holes . Craniotomy for Subdural;  Surgeon: Ditty, Kevan Ny, MD;  Location: [place];  Service: Neurosurgery;  Laterality: N/A;  . EYE SURGERY  2015   cataract surgery. bilateral  . JOINT REPLACEMENT  2009   bilateral knee replacement  . KNEE SURGERY    . ROTATOR CUFF REPAIR Left   . SHOULDER SURGERY    . TOTAL KNEE ARTHROPLASTY Bilateral         Home Medications    Prior to Admission medications   Medication Sig Start Date End Date Taking? Authorizing Provider  albuterol (PROVENTIL HFA;VENTOLIN HFA) 108 (90 Base) MCG/ACT inhaler Inhale 2 puffs into the lungs every 6 (six) hours as needed for wheezing or shortness of breath. 02/04/18  Dettinger, [name], MD  atorvastatin (LIPITOR) 10 MG tablet Take 1 tablet (10 mg total) by mouth daily. 12/17/17   Chipper Herb, MD  ciprofloxacin (CIPRO) 500 MG tablet Take 1 tablet (500 mg total) by mouth 2 (two) times daily for 2 days. 02/16/18 02/11/2018  Ghimire, Henreitta Leber, MD  dexamethasone (DECADRON) 4 MG tablet Take 1 tablet (4 mg total) by mouth daily. 01/30/18   Shelly Coss, MD  flecainide (TAMBOCOR) 100 MG tablet TAKE 1 TABLET (100 MG TOTAL) BY MOUTH 2 (TWO) TIMES DAILY. Patient taking differently: Take 100 mg by mouth 2 (two) times daily.  02/07/18   Dettinger, [name], MD  levothyroxine  (SYNTHROID, LEVOTHROID) 50 MCG tablet TAKE 2 TABLETS BY MOUTH DAILY Patient taking differently: Take 100 mcg by mouth daily before breakfast.  07/29/17   Timmothy Euler, MD  metFORMIN (GLUCOPHAGE-XR) 500 MG 24 hr tablet Take 1 tablet (500 mg total) by mouth daily with breakfast. (Need to be seen) 01/14/18   Dettinger, [name], MD    Family History Family History  Problem Relation Age of Onset  . Heart attack Brother   . Early death Brother   . Asthma Father   . Vision loss Father   . Hypertension Mother   . Heart attack Brother 54  . Arthritis Brother     Social History Social History   Tobacco Use  . Smoking status: Never Smoker  . Smokeless tobacco: Never Used  Substance Use Topics  . Alcohol use: No    Frequency: Never    Comment: remote use, h/o heavy binge use about 25 years ago  . Drug use: No    Comment: remote h/o marijuana use     Allergies   Patient has no known allergies.   Review of Systems Review of Systems  Unable to perform ROS: Mental status change  Neurological: Positive for seizures.     Physical Exam Updated Vital Signs BP (!) 141/103   Pulse 94   Temp 98.1 F (36.7 C) (Oral)   Resp 16   Ht 5\' 9"  (1.753 m)   Wt 117.8 kg   SpO2 94%   BMI 38.35 kg/m   Physical Exam Vitals signs reviewed.  Constitutional:      Appearance: He is well-developed.     Comments: Lethargic  HENT:     Head: Normocephalic.     Nose: Nose normal.     Mouth/Throat:     Mouth: Mucous membranes are moist.  Eyes:     General: No scleral icterus.    Conjunctiva/sclera: Conjunctivae normal.  Neck:     Musculoskeletal: Neck supple.     Thyroid: No thyromegaly.  Cardiovascular:     Rate and Rhythm: Normal rate and regular rhythm.     Heart sounds: No murmur. No friction rub. No gallop.   Pulmonary:     Breath sounds: No stridor. No wheezing or rales.  Chest:     Chest wall: No tenderness.  Abdominal:     General: There is no distension.      Tenderness: There is no abdominal tenderness. There is no rebound.  Musculoskeletal: Normal range of motion.  Lymphadenopathy:     Cervical: No cervical adenopathy.  Skin:    Findings: No erythema or rash.  Neurological:     Motor: No abnormal muscle tone.     Coordination: Coordination normal.     Comments: Patient lethargic and oriented to person      ED Treatments /  Results  Labs (all labs ordered are listed, but only abnormal results are displayed) Labs Reviewed  BASIC METABOLIC PANEL - Abnormal; Notable for the following components:      Result Value   Sodium 132 (*)    CO2 21 (*)    Glucose, Bld 145 (*)    All other components within normal limits  CBC WITH DIFFERENTIAL/PLATELET - Abnormal; Notable for the following components:   WBC 15.6 (*)    RBC 5.84 (*)    Neutro Abs 12.8 (*)    Monocytes Absolute 1.4 (*)    Abs Immature Granulocytes 0.10 (*)    All other components within normal limits  HEPATIC FUNCTION PANEL - Abnormal; Notable for the following components:   ALT 62 (*)    Total Bilirubin 1.4 (*)    Bilirubin, Direct 0.3 (*)    Indirect Bilirubin 1.1 (*)    All other components within normal limits  CBG MONITORING, ED - Abnormal; Notable for the following components:   Glucose-Capillary 142 (*)    All other components within normal limits    EKG None  Radiology Ct Head Wo Contrast  Result Date: 02/19/2018 CLINICAL DATA:  Weakness. Seizure. Metastatic melanoma. Recent radiation therapy for brain tumors. Craniotomy for subdural 02/05/2017. EXAM: CT HEAD WITHOUT CONTRAST TECHNIQUE: Contiguous axial images were obtained from the base of the skull through the vertex without intravenous contrast. COMPARISON:  02/13/2018 FINDINGS: Brain: (Formerly the same on 02/13/2018 by my measurements), a 1.6 by 1.2 cm left inferior frontal lobe lesion (stable), a mixed density 2.9 by 2.6 cm lesion in the left frontal lobe on image 20/2 (stable), a 4.2 by 2.0 cm right  frontal mass (previously measuring 3.6 by 3.0 cm but with a somewhat different orientation today and with different slice orientation). Bilateral intraventricular hemorrhage in the occipital horns of the lateral ventricles, slightly increased in volume subjectively. Subarachnoid hemorrhage along both parietal lobes for example on image 19/2, thought to be slightly increased. Cystic cavity along the margin of the dominant right frontal lobe metastatic lesion, potentially with a small amount of blood within the cavity, and with questionable connectivity of this cystic lesion with the frontal horn of the right lateral ventricle. Mild hydrocephalus.  No midline shift. Vascular: Unremarkable Skull: Old left craniotomy.  Old bilateral burr holes. Sinuses/Orbits: Chronic bilateral maxillary sinusitis. Small bilateral mastoid effusions. Orbits unremarkable. Other: No supplemental non-categorized findings. IMPRESSION: 1. Scattered intracranial metastatic lesions mostly similar to the exam from 8 days ago. The dominant right frontal metastatic lesion has changed slightly in orientation but is probably roughly stable. However, the amount of intraventricular hemorrhage in the occipital horns of the lateral ventricles appears to have increased compared to 02/13/2018, and there is also some mild but increased bilateral subarachnoid hemorrhage along the parietal lobes bilaterally. Stable mild hydrocephalus. Stable postoperative findings in the cranium. 2. Chronic bilateral maxillary sinusitis with small bilateral mastoid effusions. Electronically Signed   By: Van Clines M.D.   On: 02/06/2018 15:55    Procedures Procedures (including critical care time)  Medications Ordered in ED Medications  0.9 %  sodium chloride infusion ( Intravenous New Bag/Given 02/01/2018 1546)  levETIRAcetam (KEPPRA) IVPB 1000 mg/100 mL premix (1,000 mg Intravenous New Bag/Given 02/17/2018 1547)     Initial Impression / Assessment and Plan  / ED Course  I have reviewed the triage vital signs and the nursing notes.  Pertinent labs & imaging results that were available during my care of the patient were reviewed  by me and considered in my medical decision making (see chart for details).     CRITICAL CARE Performed by: Milton Ferguson Total critical care time: 40 minutes Critical care time was exclusive of separately billable procedures and treating other patients. Critical care was necessary to treat or prevent imminent or life-threatening deterioration. Critical care was time spent personally by me on the following activities: development of treatment plan with patient and/or surrogate as well as nursing, discussions with consultants, evaluation of patient's response to treatment, examination of patient, obtaining history from patient or surrogate, ordering and performing treatments and interventions, ordering and review of laboratory studies, ordering and review of radiographic studies, pulse oximetry and re-evaluation of patient's condition. I spoke with oncology and they agree with hospitalization for seizures.  I spoke with neurosurgery Dr. Trenton Gammon and he recommended Decadron 4 mg every 6.  He stated there is no surgical intervention for the bleeding of his head.  Patient will be admitted to the hospitalist and started on Keppra  Final Clinical Impressions(s) / ED Diagnoses   Final diagnoses:  Seizure [facility])    ED Discharge Orders    None       Milton Ferguson, MD 02/22/2018 [phone]

## 2018-02-18 NOTE — H&P (Signed)
History and Physical    [name] [name] BTD:[id number] DOB: 1940/11/30 DOA: 01/31/2018  PCP: Dettinger, [name], MD Patient coming from: Home  Chief Complaint: Generalized weakness   HPI: [name] [name] is a 77 y.o. male with medical history significant of melanoma with brain mets, subdural hematoma, A. fib, glucose intolerance, hypertension, hyperlipidemia, hypothyroidism presenting to the hospital for evaluation of generalized weakness.  Upon EMS arrival, noted to have a focal seizure that lasted approximately 4 minutes.  Patient is currently on Decadron and receiving radiation therapy for brain mets.  He was recently admitted from February 13, 2018 to February 16, 2018 for acute encephalopathy thought to be secondary to underlying brain mets and possible UTI.  Urine culture was positive for Proteus and patient was discharged with ciprofloxacin.  Patient was somnolent, as such, history was provided by wife at bedside.  Wife states that the family was trying to get to the patient to the bathroom today but even before they could get him up from the chair they noticed that he slumped backwards in the chair and lost consciousness for a few minutes.  They believe patient has been confused for the past few days.  When EMS arrived they noticed that the patient's body was stiff and they thought he was having a seizure.  Family denies any prior history of seizures.  They are not sure if the patient was incontinent at that time.  He does take Decadron daily.  Wife states that since his recent hospital discharge patient has been very lethargic and irritable.  He never went back to his baseline.  He has not been having any fevers.  He has been receiving ciprofloxacin at home and is at the end of the UTI treatment course.   ED Course: Afebrile and hemodynamically stable.  White count 15.6 in the setting of home steroid use. CT head showing scattered intracranial metastatic lesions with increased amount of  intraventricular hemorrhage in the occipital horns of the lateral ventricles compared to CT done February 13, 2018.  Also showing mild but increased bilateral subarachnoid hemorrhage along the parietal lobes bilaterally. ED provider discussed the case with Dr. Burr Medico from oncology who recommended increasing the dose of Decadron.  He discussed the case with Dr. Trenton Gammon from neurosurgery who stated he is very familiar with the patient and reviewed the patient's scans.  Dr. Trenton Gammon informed ED physician that he will be keeping an eye on the patient and did not recommend ICU admission at this time.  He recommended giving Decadron 4 mg every 6 hours.  Review of Systems: As per HPI otherwise 10 point review of systems negative.  Past Medical History:  Diagnosis Date  . Arthritis   . Atrial fibrillation ([place])    previously on Coumadin, reversed with head bleed  . Cataract   . Diabetes mellitus without complication ([place])   . Hyperlipidemia   . HYPERTENSION   . HYPOTHYROIDISM   . 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 Melanoma ([place])    metastatic melanoma with multiple brain mets  . Pre-diabetes   . Precancerous skin lesion    on back  . ROTATOR CUFF TEAR   . Subdural hematoma [facility])     Past Surgical History:  Procedure Laterality Date  . APPENDECTOMY    . CATARACT EXTRACTION W/PHACO Right 02/02/2014   Procedure: CATARACT EXTRACTION PHACO AND INTRAOCULAR LENS PLACEMENT (IOC);  Surgeon: Elta Guadeloupe T. Gershon Crane, MD;  Location: [facility];  Service: Ophthalmology;  Laterality: Right;  CDE 12.17  . CATARACT EXTRACTION W/PHACO Left 02/16/2014   Procedure: CATARACT EXTRACTION PHACO AND INTRAOCULAR LENS PLACEMENT ;  Surgeon: Elta Guadeloupe T. Gershon Crane, MD;  Location: [facility] ORS;  Service: Ophthalmology;  Laterality: Left;  CDE:13.42  . CRANIOTOMY N/A 02/05/2017   Procedure: Bilateral Burr Holes . Craniotomy for Subdural;  Surgeon: Ditty, Kevan Ny, MD;  Location: [facility];  Service: Neurosurgery;   Laterality: N/A;  . EYE SURGERY  2015   cataract surgery. bilateral  . JOINT REPLACEMENT  2009   bilateral knee replacement  . KNEE SURGERY    . ROTATOR CUFF REPAIR Left   . SHOULDER SURGERY    . TOTAL KNEE ARTHROPLASTY Bilateral      reports that he has never smoked. He has never used smokeless tobacco. He reports that he does not drink alcohol or use drugs.  No Known Allergies  Family History  Problem Relation Age of Onset  . Heart attack Brother   . Early death Brother   . Asthma Father   . Vision loss Father   . Hypertension Mother   . Heart attack Brother 75  . Arthritis Brother     Prior to Admission medications   Medication Sig Start Date End Date Taking? Authorizing Provider  albuterol (PROVENTIL HFA;VENTOLIN HFA) 108 (90 Base) MCG/ACT inhaler Inhale 2 puffs into the lungs every 6 (six) hours as needed for wheezing or shortness of breath. 02/04/18   Dettinger, [name], MD  atorvastatin (LIPITOR) 10 MG tablet Take 1 tablet (10 mg total) by mouth daily. 12/17/17   Chipper Herb, MD  ciprofloxacin (CIPRO) 500 MG tablet Take 1 tablet (500 mg total) by mouth 2 (two) times daily for 2 days. 02/16/18 02/14/2018  Ghimire, Henreitta Leber, MD  dexamethasone (DECADRON) 4 MG tablet Take 1 tablet (4 mg total) by mouth daily. 01/30/18   Shelly Coss, MD  flecainide (TAMBOCOR) 100 MG tablet TAKE 1 TABLET (100 MG TOTAL) BY MOUTH 2 (TWO) TIMES DAILY. Patient taking differently: Take 100 mg by mouth 2 (two) times daily.  02/07/18   Dettinger, [name], MD  levothyroxine (SYNTHROID, LEVOTHROID) 50 MCG tablet TAKE 2 TABLETS BY MOUTH DAILY Patient taking differently: Take 100 mcg by mouth daily before breakfast.  07/29/17   Timmothy Euler, MD  metFORMIN (GLUCOPHAGE-XR) 500 MG 24 hr tablet Take 1 tablet (500 mg total) by mouth daily with breakfast. (Need to be seen) 01/14/18   Dettinger, [name], MD    Physical Exam: Vitals:   02/02/2018 1432 02/14/2018 1516 02/14/2018 1630 02/04/2018 1700  BP:   (!) 146/92 (!) 149/100 (!) 141/103  Pulse:  95 (!) 103 94  Resp:  16    Temp:      TempSrc:      SpO2:  96% 95% 94%  Weight: 117.8 kg     Height: 5' 9"  (1.753 m)       Physical Exam  Constitutional: He appears well-developed and well-nourished. No distress.  HENT:  Head: Normocephalic.  Eyes: Pupils are equal, round, and reactive to light.  Neck: Neck supple.  Cardiovascular: Normal rate, regular rhythm and intact distal pulses.  Pulmonary/Chest:  Anterior lung fields clear to auscultation  Abdominal: Soft. Bowel sounds are normal. He exhibits no distension. There is no abdominal tenderness. There is no guarding.  Musculoskeletal:        General: No edema.  Neurological: No cranial nerve deficit.  Very somnolent Waking up only intermittently to  answer questions.  Not following commands. Oriented to person and place only. No facial droop No slurring of speech  Skin: Skin is warm and dry. He is not diaphoretic.     Labs on Admission: I have personally reviewed following labs and imaging studies  CBC: Recent Labs  Lab 02/12/18 1354 02/13/18 2309 02/14/18 0541 02/07/2018 1513  WBC 12.9* 10.0 11.3* 15.6*  NEUTROABS 11.2*  --  10.1* 12.8*  HGB 15.5 15.8 14.5 16.7  HCT 46.6 47.6 43.8 50.2  MCV 88.6 87.3 88.0 86.0  PLT 154 134* 133* 825   Basic Metabolic Panel: Recent Labs  Lab 02/12/18 1354 02/13/18 2309 02/14/18 0541 02/16/18 0547 02/19/2018 1513  NA 136 138 136 136 132*  K 3.9 4.0 4.1 3.9 3.6  CL 101 103 102 102 98  CO2 25 19* 21* 20* 21*  GLUCOSE 84 130* 189* 111* 145*  BUN 18 18 17 22 20   CREATININE 1.01 1.23 1.30* 0.98 1.11  CALCIUM 9.2 9.0 8.5* 8.4* 9.3  MG  --   --  2.0  --   --    GFR: Estimated Creatinine Clearance: 72.8 mL/min (by C-G formula based on SCr of 1.11 mg/dL). Liver Function Tests: Recent Labs  Lab 02/12/18 1354 02/13/18 2309 02/14/18 0541 02/08/2018 1516  AST 17 29 25 30   ALT 34 44 39 62*  ALKPHOS 65 55 47 53  BILITOT 0.7 1.0 0.8  1.4*  PROT 7.3 6.8 6.2* 7.4  ALBUMIN 3.7 3.6 3.1* 3.8   No results for input(s): LIPASE, AMYLASE in the last 168 hours. No results for input(s): AMMONIA in the last 168 hours. Coagulation Profile: No results for input(s): INR, PROTIME in the last 168 hours. Cardiac Enzymes: No results for input(s): CKTOTAL, CKMB, CKMBINDEX, TROPONINI in the last 168 hours. BNP (last 3 results) No results for input(s): PROBNP in the last 8760 hours. HbA1C: No results for input(s): HGBA1C in the last 72 hours. CBG: Recent Labs  Lab 02/15/18 1656 02/15/18 2123 02/16/18 0730 02/16/18 1158 01/26/2018 1520  GLUCAP 120* 104* 82 93 142*   Lipid Profile: No results for input(s): CHOL, HDL, LDLCALC, TRIG, CHOLHDL, LDLDIRECT in the last 72 hours. Thyroid Function Tests: No results for input(s): TSH, T4TOTAL, FREET4, T3FREE, THYROIDAB in the last 72 hours. Anemia Panel: No results for input(s): VITAMINB12, FOLATE, FERRITIN, TIBC, IRON, RETICCTPCT in the last 72 hours. Urine analysis:    Component Value Date/Time   COLORURINE YELLOW 02/14/2018 0221   APPEARANCEUR TURBID (A) 02/14/2018 0221   LABSPEC <1.005 (L) 02/14/2018 0221   PHURINE >9.0 (H) 02/14/2018 0221   GLUCOSEU NEGATIVE 02/14/2018 0221   HGBUR TRACE (A) 02/14/2018 0221   BILIRUBINUR SMALL (A) 02/14/2018 0221   KETONESUR NEGATIVE 02/14/2018 0221   PROTEINUR >300 (A) 02/14/2018 0221   UROBILINOGEN 1.0 05/11/2010 1145   NITRITE POSITIVE (A) 02/14/2018 0221   LEUKOCYTESUR LARGE (A) 02/14/2018 0221    Radiological Exams on Admission: Ct Head Wo Contrast  Result Date: 02/22/2018 CLINICAL DATA:  Weakness. Seizure. Metastatic melanoma. Recent radiation therapy for brain tumors. Craniotomy for subdural 02/05/2017. EXAM: CT HEAD WITHOUT CONTRAST TECHNIQUE: Contiguous axial images were obtained from the base of the skull through the vertex without intravenous contrast. COMPARISON:  02/13/2018 FINDINGS: Brain: (Formerly the same on 02/13/2018 by my  measurements), a 1.6 by 1.2 cm left inferior frontal lobe lesion (stable), a mixed density 2.9 by 2.6 cm lesion in the left frontal lobe on image 20/2 (stable), a 4.2 by 2.0 cm right frontal  mass (previously measuring 3.6 by 3.0 cm but with a somewhat different orientation today and with different slice orientation). Bilateral intraventricular hemorrhage in the occipital horns of the lateral ventricles, slightly increased in volume subjectively. Subarachnoid hemorrhage along both parietal lobes for example on image 19/2, thought to be slightly increased. Cystic cavity along the margin of the dominant right frontal lobe metastatic lesion, potentially with a small amount of blood within the cavity, and with questionable connectivity of this cystic lesion with the frontal horn of the right lateral ventricle. Mild hydrocephalus.  No midline shift. Vascular: Unremarkable Skull: Old left craniotomy.  Old bilateral burr holes. Sinuses/Orbits: Chronic bilateral maxillary sinusitis. Small bilateral mastoid effusions. Orbits unremarkable. Other: No supplemental non-categorized findings. IMPRESSION: 1. Scattered intracranial metastatic lesions mostly similar to the exam from 8 days ago. The dominant right frontal metastatic lesion has changed slightly in orientation but is probably roughly stable. However, the amount of intraventricular hemorrhage in the occipital horns of the lateral ventricles appears to have increased compared to 02/13/2018, and there is also some mild but increased bilateral subarachnoid hemorrhage along the parietal lobes bilaterally. Stable mild hydrocephalus. Stable postoperative findings in the cranium. 2. Chronic bilateral maxillary sinusitis with small bilateral mastoid effusions. Electronically Signed   By: Van Clines M.D.   On: 02/16/2018 15:55    EKG: Independently reviewed.  Sinus tachycardia (heart rate 101), occasional PVCs, artifact.  Assessment/Plan   Melanoma with brain mets,  intraventricular hemorrhage and subarachnoid hemorrhage, witnessed seizure CT head showing scattered intracranial metastatic lesions with increased amount of intraventricular hemorrhage in the occipital horns of the lateral ventricles compared to CT done February 13, 2018.  Also showing mild but increased bilateral subarachnoid hemorrhage along the parietal lobes bilaterally. ED provider discussed the case with Dr. Burr Medico from oncology who recommended increasing the dose of Decadron.  He discussed the case with Dr. Trenton Gammon from neurosurgery who informed ED physician that he will be keeping an eye on the patient and did not recommend ICU admission at this time.  He recommended giving Decadron 4 mg every 6 hours. I personally spoke to Dr. Trenton Gammon at 5:57 pm.  Informed him that the patient will be transferred to [facility].  He stated he is familiar with the patient and has reviewed his scans. He thinks that the CT findings are stable.  Stated there is no urgent surgical need and neurosurgery can be consulted if needed during the patient's hospitalization at [facility]. -Transfer to progressive care unit at [facility] -IV Decadron 4 mg every 6 hours -Received loading dose Keppra in the ED. Will continue IV Keppra 500 mg twice daily. -Patient is very somnolent at present. Will keep him n.p.o., aspiration precautions, and seizure precautions. -IV fluid hydration  Leukocytosis Afebrile.  White count 15.6 in the setting of home steroid use.  Patient was recently treated for UTI and is now at the end of his treatment course.  Per wife, he has not been complaining of a cough.  Not dyspneic and satting well on room air. -Check urine culture -Repeat CBC in a.m.  Mild hyponatremia Sodium 133.  Possibly due to decreased p.o. intake. -IV fluid hydration -Repeat BMP in a.m.  Elevated LFTs AST mildly elevated at 62 and T bili mildly elevated at 1.4.  AST and alk phos normal.  LFTs have previously been normal.  MRI abdomen  done January 26, 2018 showing mild diffuse hepatic steatosis.  Normal gallbladder with no cholelithiasis.  No biliary ductal dilatation or choledocholithiasis.  Common bile duct diameter 3 mm. -Continue to monitor; hepatic function panel in a.m.  Paroxysmal atrial fibrillation -Currently in sinus rhythm.  He is not on anticoagulation due to history of intracranial hemorrhage and subdural hematoma. -Hold home flecainide at this time as patient is n.p.o.  History of glucose intolerance A1c 6.1 in September 2019.  Blood glucose 145 on admission.   -Sliding scale insulin and CBG checks  Hypertension Systolic currently in 177L and diastolic in the 39Q.  Patient is not on any home medications.  -Continue to monitor blood pressure -IV hydralazine prn SBP >160  Hypothyroidism  Recent TSH 1.13.   -Continue levothyroxine in IV form.  Reactive airway disease -Stable.  No bronchospasm.  Continue albuterol as needed.  Physical deconditioning/ generalized weakness In the setting of metastatic cancer. -PT evaluation  DVT prophylaxis: SCDs Code Status: Unable to discuss with the patient as he is very somnolent.  Wife wants the patient to be full code. Family Communication: Wife and daughter updated at bedside. Disposition Plan: Anticipate discharge to home in 1 to 2 days. Consults called: Neurosurgery (Dr. Trenton Gammon), oncology (Dr. Burr Medico) Admission status: Observation, progressive care unit   Shela Leff MD [facility] Pager [phone]  If 7PM-7AM, please contact night-coverage [email or web address] Password [facility]  01/28/2018, 5:27 PM

## 2018-02-18 NOTE — Progress Notes (Signed)
  Radiation Oncology         [phone] ________________________________  Outpatient Brain metastases ([place])    ICD-10-CM   1. Brain metastases [facility]) C79.31      Stereotactic Treatment Procedure Note  Name: [name] [name] MRN: [id number]  Date: 02/17/2018  DOB: 10/13/1940  SPECIAL TREATMENT PROCEDURE  3D TREATMENT PLANNING AND DOSIMETRY:  The patient's radiation plan was reviewed and approved by neurosurgery and radiation oncology prior to treatment.  It showed 3-dimensional radiation distributions overlaid onto the planning CT/MRI image set.  [facility] for the target structures as well as the organs at risk were reviewed. The documentation of the 3D plan and dosimetry are filed in the radiation oncology EMR.  NARRATIVE:  BRICE KOSSMAN was brought to the TrueBeam stereotactic radiation treatment machine and placed supine on the CT couch. The head frame was applied, and the patient was set up for stereotactic radiosurgery.  Neurosurgery was present for the set-up and delivery  SIMULATION VERIFICATION:  In the couch zero-angle position, the patient underwent Exactrac imaging using the Brainlab system with orthogonal KV images.  These were carefully aligned and repeated to confirm treatment position for each of the isocenters.  The Exactrac snap film verification was repeated at each couch angle.  SPECIAL TREATMENT PROCEDURE: [name] received stereotactic radiosurgery to the following target with 6FFF photons, using 6 vmat beams Max dose=117.1%:  PTV1 Rt Front 59mm to a dose of 9Gy (2nd of 3 fractions)   This constitutes a special treatment procedure due to the ablative dose delivered and the technical nature of treatment.  This highly technical modality of treatment ensures that the ablative dose is centered on the patient's tumor while sparing normal tissues from excessive dose and risk of detrimental effects.  STEREOTACTIC TREATMENT MANAGEMENT:  Following delivery, the patient was  transported to nursing in stable condition and monitored for possible acute effects.   The patient tolerated treatment without significant acute effects, and was brought back to the inpatient unit in stable condition.  PLAN: Follow-up in 3 days for final fraction to 6mm lesion.  ________________________________   Eppie Gibson, MD

## 2018-02-19 DIAGNOSIS — E7439 Other disorders of intestinal carbohydrate absorption: Secondary | ICD-10-CM

## 2018-02-19 DIAGNOSIS — D72829 Elevated white blood cell count, unspecified: Secondary | ICD-10-CM

## 2018-02-19 DIAGNOSIS — C7931 Secondary malignant neoplasm of brain: Principal | ICD-10-CM

## 2018-02-19 DIAGNOSIS — E039 Hypothyroidism, unspecified: Secondary | ICD-10-CM

## 2018-02-19 DIAGNOSIS — R5381 Other malaise: Secondary | ICD-10-CM

## 2018-02-19 DIAGNOSIS — E871 Hypo-osmolality and hyponatremia: Secondary | ICD-10-CM

## 2018-02-19 DIAGNOSIS — S065X9A Traumatic subdural hemorrhage with loss of consciousness of unspecified duration, initial encounter: Secondary | ICD-10-CM

## 2018-02-19 DIAGNOSIS — R945 Abnormal results of liver function studies: Secondary | ICD-10-CM

## 2018-02-19 DIAGNOSIS — I10 Essential (primary) hypertension: Secondary | ICD-10-CM

## 2018-02-19 DIAGNOSIS — I480 Paroxysmal atrial fibrillation: Secondary | ICD-10-CM

## 2018-02-19 LAB — GLUCOSE, CAPILLARY
GLUCOSE-CAPILLARY: 116 mg/dL — AB (ref 70–99)
Glucose-Capillary: 116 mg/dL — ABNORMAL HIGH (ref 70–99)
Glucose-Capillary: 113 mg/dL — ABNORMAL HIGH (ref 70–99)
Glucose-Capillary: 116 mg/dL — ABNORMAL HIGH (ref 70–99)

## 2018-02-19 MED ORDER — WHITE PETROLATUM EX OINT
TOPICAL_OINTMENT | CUTANEOUS | Status: AC
  Administered 2018-02-19: 0.2
  Filled 2018-02-19: qty 28.35

## 2018-02-19 NOTE — Evaluation (Signed)
Physical Therapy Evaluation Patient Details Name: [name] [name] MRN: [id number] DOB: [birth year] Today's Date: 02/19/2018   History of Present Illness  Pt is a 77 y.o. male admitted 02/01/2018 with generalized weakness and confusion, found to be slumped over with LOC requiring EMS who suspected seizure. Of note, recent admission 02/13/18-02/16/18 for acute encephalopathy thought to be secondary to underlying brain mets and possible UTI. 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    Clinical Impression  Pt presents with an overall decrease in functional mobility secondary to above. Per chart, pt recently admitted at [facility] and was ambulating 100' with minA on 02/16/18. Today, pt required maxA for bed mobility and sitting balance. Pt demonstrates impaired cognition, staring blankly at wall often throughout session, responds to name and answers some questions, but inconsistently following commands and responding. Recommend use of maximove for OOB transfers with nursing staff. Pt would benefit from continued acute PT services to maximize functional mobility and independence prior to d/c with SNF-level therapies.     Follow Up Recommendations SNF;Supervision/Assistance - 24 hour    Equipment Recommendations  (hoyer lift)    Recommendations for Other Services       Precautions / Restrictions Precautions Precautions: Fall Precaution Comments: L eye blindness Restrictions Weight Bearing Restrictions: No      Mobility  Bed Mobility Overal bed mobility: Needs Assistance Bed Mobility: Supine to Sit;Sit to Supine     Supine to sit: Max assist Sit to supine: Max assist   General bed mobility comments: Max, multimodal cues for sequencing, requiring frequent cues to attend to task as pt will stare blankly at wall until his name is repeated. MaxA to  assist BLE movement and trunk elevation. Once returned to supine, pt able to assist with BUEs to pull up in bed, required max cues for sequencing and maxA  Transfers Overall transfer level: Needs assistance               General transfer comment: Set up and initiated attempt to stand from EOB with BUE support and maxA, pt pushing hard posteriorly despite multiple attempts and max, multimodal cues for anterior weight translation and movement sequencing. Pt easily fatigued requiring maxA to maintain seated balance  Ambulation/Gait             General Gait Details: NT secondary to difficulty following commands and needing increased assist. Recommend use of maximove for future OOB transfers with nursing staff (RN notified)  Stairs            Wheelchair Mobility    Modified Rankin (Stroke Patients Only)       Balance Overall balance assessment: Needs assistance   Sitting balance-Leahy Scale: Poor Sitting balance - Comments: Able to briefly maintain seated balance with min guard; easily fatigued requiring mod-maxA and frequent cues for weight translation and use of BUE support                                     Pertinent Vitals/Pain Pain Assessment: No/denies pain    Home Living Family/patient expects to be discharged to:: Skilled nursing facility Living Arrangements: Spouse/significant other Available Help at Discharge: Family;Available 24 hours/day Type of Home: House Home Access: Ramped entrance     Home Layout: One level Home Equipment: Gilford Rile -  2 wheels;Cane - single point;Bedside commode;Shower seat Additional Comments: Info taken from PT evaluation on 02/16/18    Prior Function Level of Independence: Independent         Comments: Pt reports no use of DME at home. Unsure if reliable. Per chart, pt was ambulatory without DME on 02/16/18 during PT evaluation at [facility]     Hand Dominance        Extremity/Trunk Assessment   Upper  Extremity Assessment Upper Extremity Assessment: Generalized weakness(Functionally able to lift both shoulders overhead to use bed rail to scoot up in bed; h/o rotator cuff repair)    Lower Extremity Assessment Lower Extremity Assessment: Generalized weakness(Functionally able to bend both knees through partial range in supine, weakness when attempting to bridge to scoot)       Communication   Communication: Expressive difficulties  Cognition Arousal/Alertness: Awake/alert Behavior During Therapy: Flat affect Overall Cognitive Status: No family/caregiver present to determine baseline cognitive functioning Area of Impairment: Orientation;Attention;Following commands;Safety/judgement;Awareness;Problem solving                 Orientation Level: Disoriented to;Time Current Attention Level: Focused   Following Commands: Follows one step commands inconsistently;Follows one step commands with increased time Safety/Judgement: Decreased awareness of deficits;Decreased awareness of safety Awareness: Intellectual Problem Solving: Slow processing;Decreased initiation;Difficulty sequencing;Requires verbal cues;Requires tactile cues General Comments: Able to state first name, [facility], and "brain bleed". Not able to state any part of today's date or that it is Christmas. Inconsistent response to questions; does better when name stated and looking right at him with simple question. Laughs appropriately to jokes. Frequently stares blankly at wall with mouth open breathing hard. Required maxA to sit, but at end of session reports safe to go home from hospital       General Comments      Exercises     Assessment/Plan    PT Assessment Patient needs continued PT services  PT Problem List Decreased strength;Decreased activity tolerance;Decreased balance;Decreased mobility;Decreased cognition;Decreased knowledge of use of DME;Decreased safety awareness       PT Treatment Interventions DME  instruction;Gait training;Functional mobility training;Therapeutic activities;Balance training;Patient/family education;Therapeutic exercise;Stair training;Cognitive remediation;Neuromuscular re-education;Wheelchair mobility training    PT Goals (Current goals can be found in the Care Plan section)  Acute Rehab PT Goals Patient Stated Goal: None stated PT Goal Formulation: With patient Time For Goal Achievement: 03/05/18 Potential to Achieve Goals: Poor    Frequency Min 3X/week   Barriers to discharge        Co-evaluation               AM-PAC PT "6 Clicks" Mobility  Outcome Measure Help needed turning from your back to your side while in a flat bed without using bedrails?: A Lot Help needed moving from lying on your back to sitting on the side of a flat bed without using bedrails?: A Lot Help needed moving to and from a bed to a chair (including a wheelchair)?: Total Help needed standing up from a chair using your arms (e.g., wheelchair or bedside chair)?: Total Help needed to walk in hospital room?: Total Help needed climbing 3-5 steps with a railing? : Total 6 Click Score: 8    End of Session Equipment Utilized During Treatment: Gait belt Activity Tolerance: Patient limited by fatigue Patient left: in bed;with call bell/phone within reach;with bed alarm set Nurse Communication: Mobility status;Need for lift equipment PT Visit Diagnosis: Unsteadiness on feet (R26.81);Muscle weakness (generalized) (M62.81);Other symptoms and signs involving the nervous system (  R29.898)    Time: 4175-3010 PT Time Calculation (min) (ACUTE ONLY): 32 min   Charges:   PT Evaluation $PT Eval Moderate Complexity: 1 Mod PT Treatments $Therapeutic Activity: 8-22 mins      Mabeline Caras, PT, DPT Acute Rehabilitation Services  Pager [phone] Office [place] 02/19/2018, 2:10 PM

## 2018-02-19 NOTE — Progress Notes (Signed)
PROGRESS NOTE    [name] [name]  XKG:[id number] DOB: Jul 06, 1940 DOA: 02/19/2018 PCP: Dettinger, [name], MD   Brief Narrative:  HPI on 12/24/219 by Dr. Shela Leff [name] [name] is a 77 y.o. male with medical history significant of melanoma with brain mets, subdural hematoma, A. fib, glucose intolerance, hypertension, hyperlipidemia, hypothyroidism presenting to the hospital for evaluation of generalized weakness.  Upon EMS arrival, noted to have a focal seizure that lasted approximately 4 minutes.  Patient is currently on Decadron and receiving radiation therapy for brain mets.  He was recently admitted from February 13, 2018 to February 16, 2018 for acute encephalopathy thought to be secondary to underlying brain mets and possible UTI.  Urine culture was positive for Proteus and patient was discharged with ciprofloxacin.  Patient was somnolent, as such, history was provided by wife at bedside.  Wife states that the family was trying to get to the patient to the bathroom today but even before they could get him up from the chair they noticed that he slumped backwards in the chair and lost consciousness for a few minutes.  They believe patient has been confused for the past few days.  When EMS arrived they noticed that the patient's body was stiff and they thought he was having a seizure.  Family denies any prior history of seizures.  They are not sure if the patient was incontinent at that time.  He does take Decadron daily.  Wife states that since his recent hospital discharge patient has been very lethargic and irritable.  He never went back to his baseline.  He has not been having any fevers.  He has been receiving ciprofloxacin at home and is at the end of the UTI treatment course.   Interim history Admitted with seizure, altered mental status. Assessment & Plan   Melanoma with brain metastasis, intraventricular hemorrhage, subarachnoid hemorrhage, seizure -CT head showed scattered  intracranial metastatic lesions with increased amount of intraventricular hemorrhage in the occipital horns of the lateral ventricles compared to CT done February 13, 2018.  Also showing mild but increased bilateral subarachnoid hemorrhage along the parietal lobes bilaterally. -EDP discussed with Dr. Burr Medico from oncology, recommended increasing dose of Decadron. -Admitting physician discussed with Dr. Trenton Gammon, neurosurgery, who recommended giving Decadron 4 mg every 6 hours.  Feels CT findings are stable, no urgent surgical need and neurosurgery can be consulted if needed. -Continue Keppra 500 mg IV twice daily  Leukocytosis -Currently afebrile -Patient was recently treated for UTI (Proteus 02/14/2018) and is now at the end of his treatment course -Suspect leukocytosis secondary to steroid use -Repeat urine culture -Continue to monitor CBC  Mild Hyponatremia -sodium 133 on admission -continue IVF  -Continue BMP  Elevated LFTs -MRI abdomen, January 26, 2018, showed mild diffuse hepatic steatosis.  Normal gallbladder with no cholelithiasis.  No biliary ductal dilatation or choledocholithiasis.  Common bile duct diameter 3 mm. -Continue to monitor   Paroxsymal atrial fibrillation -Currently in sinus rhythm -currently not anticoagulation due to history of intracranial hemorrhage and subarachnoid hematoma -Flecainide currently held patient currently n.p.o.  Hyperglycemia -Hemoglobin 6.27 October 2017 -Suspect secondary to steroids -Continue sliding scale and CBG monitoring  Essential hypertension -Continue hydralazine as needed  Hypothyroidism -Continue IV Synthroid  Reactive airway disease -Continue albuterol as needed.  Currently stable, no wheezing on exam  Physical deconditioning/generalized weakness -Suspect secondary to metastatic cancer -PT consulted  DVT Prophylaxis  SCDs  Code Status: Full  Family Communication: None at bedside  Disposition  Plan: Currently in  observation. Feel patient should be inpatient as he is currently receiving IV fluids and medications given that he is   Consultants Oncology, Dr. Burr Medico, by EDP (via phone) Neurosurgery, Dr. Trenton Gammon, by Dr. Marlowe Sax (via phone)  Procedures  None  Antibiotics   Anti-infectives (From admission, onward)   None      Subjective:   [name] [name] seen and examined today.  Continues to be altered mildly.  Unable to answer all questions or follow commands at this time.  Objective:   Vitals:   02/25/2018 2315 02/19/18 0413 02/19/18 0819 02/19/18 1118  BP: 114/72 140/88 (!) 144/98 (!) 139/91  Pulse: 97 95 90 90  Resp: 20 20 18 20   Temp: 97.9 F (36.6 C) 98 F (36.7 C) 97.7 F (36.5 C) 97.8 F (36.6 C)  TempSrc: Axillary Oral Oral Oral  SpO2: 98% 93% 97% 96%  Weight:      Height:        Intake/Output Summary (Last 24 hours) at 02/19/2018 1143 Last data filed at 02/21/2018 2113 Gross per 24 hour  Intake 137.42 ml  Output -  Net 137.42 ml   Filed Weights   02/15/2018 1432 02/04/2018 2207  Weight: 117.8 kg 121.9 kg    Exam  General: Well developed, chronically ill appearing, NAD  HEENT: NCAT, mucous membranes moist.   Neck: Supple  Cardiovascular: S1 S2 auscultated, RRR, no murmur  Respiratory: Clear to auscultation bilaterally  Abdomen: Soft, nontender, nondistended, + bowel sounds  Extremities: warm dry without cyanosis clubbing or edema  Neuro: AAOx2 (self, place), not following commands.   Data Reviewed: I have personally reviewed following labs and imaging studies  CBC: Recent Labs  Lab 02/12/18 1354 02/13/18 2309 02/14/18 0541 02/09/2018 1513  WBC 12.9* 10.0 11.3* 15.6*  NEUTROABS 11.2*  --  10.1* 12.8*  HGB 15.5 15.8 14.5 16.7  HCT 46.6 47.6 43.8 50.2  MCV 88.6 87.3 88.0 86.0  PLT 154 134* 133* 854   Basic Metabolic Panel: Recent Labs  Lab 02/12/18 1354 02/13/18 2309 02/14/18 0541 02/16/18 0547 02/19/2018 1513  NA 136 138 136 136 132*  K 3.9 4.0 4.1  3.9 3.6  CL 101 103 102 102 98  CO2 25 19* 21* 20* 21*  GLUCOSE 84 130* 189* 111* 145*  BUN 18 18 17 22 20   CREATININE 1.01 1.23 1.30* 0.98 1.11  CALCIUM 9.2 9.0 8.5* 8.4* 9.3  MG  --   --  2.0  --   --    GFR: Estimated Creatinine Clearance: 74.2 mL/min (by C-G formula based on SCr of 1.11 mg/dL). Liver Function Tests: Recent Labs  Lab 02/12/18 1354 02/13/18 2309 02/14/18 0541 02/21/2018 1516  AST 17 29 25 30   ALT 34 44 39 62*  ALKPHOS 65 55 47 53  BILITOT 0.7 1.0 0.8 1.4*  PROT 7.3 6.8 6.2* 7.4  ALBUMIN 3.7 3.6 3.1* 3.8   No results for input(s): LIPASE, AMYLASE in the last 168 hours. No results for input(s): AMMONIA in the last 168 hours. Coagulation Profile: No results for input(s): INR, PROTIME in the last 168 hours. Cardiac Enzymes: No results for input(s): CKTOTAL, CKMB, CKMBINDEX, TROPONINI in the last 168 hours. BNP (last 3 results) No results for input(s): PROBNP in the last 8760 hours. HbA1C: No results for input(s): HGBA1C in the last 72 hours. CBG: Recent Labs  Lab 02/16/18 0730 02/16/18 1158 02/07/2018 1520 02/13/2018 2310 02/19/18 0606  GLUCAP 82 93 142* 130* 116*   Lipid Profile:  No results for input(s): CHOL, HDL, LDLCALC, TRIG, CHOLHDL, LDLDIRECT in the last 72 hours. Thyroid Function Tests: No results for input(s): TSH, T4TOTAL, FREET4, T3FREE, THYROIDAB in the last 72 hours. Anemia Panel: No results for input(s): VITAMINB12, FOLATE, FERRITIN, TIBC, IRON, RETICCTPCT in the last 72 hours. Urine analysis:    Component Value Date/Time   COLORURINE YELLOW 02/14/2018 0221   APPEARANCEUR TURBID (A) 02/14/2018 0221   LABSPEC <1.005 (L) 02/14/2018 0221   PHURINE >9.0 (H) 02/14/2018 0221   GLUCOSEU NEGATIVE 02/14/2018 0221   HGBUR TRACE (A) 02/14/2018 0221   BILIRUBINUR SMALL (A) 02/14/2018 0221   KETONESUR NEGATIVE 02/14/2018 0221   PROTEINUR >300 (A) 02/14/2018 0221   UROBILINOGEN 1.0 05/11/2010 1145   NITRITE POSITIVE (A) 02/14/2018 0221    LEUKOCYTESUR LARGE (A) 02/14/2018 0221   Sepsis Labs: @LABRCNTIP (procalcitonin:4,lacticidven:4)  ) Recent Results (from the past 240 hour(s))  Culture, Urine     Status: Abnormal   Collection Time: 02/12/18  1:55 PM  Result Value Ref Range Status   Specimen Description   Final    URINE, CLEAN CATCH Performed at [facility], [place] [address], [place], [place] [address]    Special Requests   Final    NONE Performed at [facility], [place] [address], [place],  [address]    Culture >=100,000 COLONIES/mL PROTEUS MIRABILIS (A)  Final   Report Status 02/14/2018 FINAL  Final   Organism ID, Bacteria PROTEUS MIRABILIS (A)  Final      Susceptibility   Proteus mirabilis - MIC*    AMPICILLIN >=32 RESISTANT Resistant     CEFAZOLIN >=64 RESISTANT Resistant     CEFTRIAXONE RESISTANT Resistant     CIPROFLOXACIN <=0.25 SENSITIVE Sensitive     GENTAMICIN <=1 SENSITIVE Sensitive     IMIPENEM 4 SENSITIVE Sensitive     NITROFURANTOIN 128 RESISTANT Resistant     TRIMETH/SULFA <=20 SENSITIVE Sensitive     AMPICILLIN/SULBACTAM >=32 RESISTANT Resistant     PIP/TAZO <=4 SENSITIVE Sensitive     * >=100,000 COLONIES/mL PROTEUS MIRABILIS  Urine culture     Status: Abnormal   Collection Time: 02/14/18  2:21 AM  Result Value Ref Range Status   Specimen Description URINE, RANDOM  Final   Special Requests   Final    NONE Performed at [facility], [place] [address] [place], [place] [address]    Culture 40,000 COLONIES/mL PROTEUS MIRABILIS (A)  Final   Report Status 02/17/2018 FINAL  Final   Organism ID, Bacteria PROTEUS MIRABILIS (A)  Final      Susceptibility   Proteus mirabilis - MIC*    AMPICILLIN >=32 RESISTANT Resistant     CEFAZOLIN >=64 RESISTANT Resistant     CEFTRIAXONE RESISTANT Resistant     CIPROFLOXACIN <=0.25 SENSITIVE Sensitive     GENTAMICIN <=1 SENSITIVE Sensitive     IMIPENEM 2 SENSITIVE Sensitive     NITROFURANTOIN 128  RESISTANT Resistant     TRIMETH/SULFA <=20 SENSITIVE Sensitive     AMPICILLIN/SULBACTAM >=32 RESISTANT Resistant     PIP/TAZO <=4 SENSITIVE Sensitive     * 40,000 COLONIES/mL PROTEUS MIRABILIS      Radiology Studies: Ct Head Wo Contrast  Result Date: 02/11/2018 CLINICAL DATA:  Weakness. Seizure. Metastatic melanoma. Recent radiation therapy for brain tumors. Craniotomy for subdural 02/05/2017. EXAM: CT HEAD WITHOUT CONTRAST TECHNIQUE: Contiguous axial images were obtained from the base of the skull through the vertex without intravenous contrast. COMPARISON:  02/13/2018 FINDINGS: Brain: (  Formerly the same on 02/13/2018 by my measurements), a 1.6 by 1.2 cm left inferior frontal lobe lesion (stable), a mixed density 2.9 by 2.6 cm lesion in the left frontal lobe on image 20/2 (stable), a 4.2 by 2.0 cm right frontal mass (previously measuring 3.6 by 3.0 cm but with a somewhat different orientation today and with different slice orientation). Bilateral intraventricular hemorrhage in the occipital horns of the lateral ventricles, slightly increased in volume subjectively. Subarachnoid hemorrhage along both parietal lobes for example on image 19/2, thought to be slightly increased. Cystic cavity along the margin of the dominant right frontal lobe metastatic lesion, potentially with a small amount of blood within the cavity, and with questionable connectivity of this cystic lesion with the frontal horn of the right lateral ventricle. Mild hydrocephalus.  No midline shift. Vascular: Unremarkable Skull: Old left craniotomy.  Old bilateral burr holes. Sinuses/Orbits: Chronic bilateral maxillary sinusitis. Small bilateral mastoid effusions. Orbits unremarkable. Other: No supplemental non-categorized findings. IMPRESSION: 1. Scattered intracranial metastatic lesions mostly similar to the exam from 8 days ago. The dominant right frontal metastatic lesion has changed slightly in orientation but is probably roughly  stable. However, the amount of intraventricular hemorrhage in the occipital horns of the lateral ventricles appears to have increased compared to 02/13/2018, and there is also some mild but increased bilateral subarachnoid hemorrhage along the parietal lobes bilaterally. Stable mild hydrocephalus. Stable postoperative findings in the cranium. 2. Chronic bilateral maxillary sinusitis with small bilateral mastoid effusions. Electronically Signed   By: Van Clines M.D.   On: 02/11/2018 15:55     Scheduled Meds: . dexamethasone  4 mg Intravenous Q6H  . insulin aspart  0-9 Units Subcutaneous TID WC  . levothyroxine  50 mcg Intravenous Daily   Continuous Infusions: . sodium chloride Stopped (02/06/2018 2113)  . levETIRAcetam 500 mg (02/19/18 0531)     LOS: 0 days   Time Spent in minutes  30 minutes  Silver Parkey D.O. on 02/19/2018 at 11:43 AM  Between 7am to 7pm - Please see pager noted on [email or web address]  After 7pm go to [email or web address]  And look for the night coverage person covering for me after hours  [facility] Office  [phone]

## 2018-02-20 ENCOUNTER — Encounter: Payer: Self-pay | Admitting: *Deleted

## 2018-02-20 ENCOUNTER — Ambulatory Visit
Admission: RE | Admit: 2018-02-20 | Discharge: 2018-02-20 | Disposition: A | Discharge status: Home / Self Care | Payer: PPO | Source: Ambulatory Visit | Attending: Radiation Oncology | Admitting: Radiation Oncology

## 2018-02-20 DIAGNOSIS — E039 Hypothyroidism, unspecified: Secondary | ICD-10-CM | POA: Diagnosis present

## 2018-02-20 DIAGNOSIS — C7931 Secondary malignant neoplasm of brain: Secondary | ICD-10-CM | POA: Diagnosis present

## 2018-02-20 DIAGNOSIS — T380X5A Adverse effect of glucocorticoids and synthetic analogues, initial encounter: Secondary | ICD-10-CM | POA: Diagnosis present

## 2018-02-20 DIAGNOSIS — G936 Cerebral edema: Secondary | ICD-10-CM | POA: Diagnosis present

## 2018-02-20 DIAGNOSIS — R1310 Dysphagia, unspecified: Secondary | ICD-10-CM | POA: Diagnosis present

## 2018-02-20 DIAGNOSIS — E1165 Type 2 diabetes mellitus with hyperglycemia: Secondary | ICD-10-CM | POA: Diagnosis present

## 2018-02-20 DIAGNOSIS — N390 Urinary tract infection, site not specified: Secondary | ICD-10-CM | POA: Diagnosis present

## 2018-02-20 DIAGNOSIS — E785 Hyperlipidemia, unspecified: Secondary | ICD-10-CM | POA: Diagnosis present

## 2018-02-20 DIAGNOSIS — I10 Essential (primary) hypertension: Secondary | ICD-10-CM | POA: Diagnosis present

## 2018-02-20 DIAGNOSIS — E871 Hypo-osmolality and hyponatremia: Secondary | ICD-10-CM | POA: Diagnosis present

## 2018-02-20 DIAGNOSIS — J45909 Unspecified asthma, uncomplicated: Secondary | ICD-10-CM | POA: Diagnosis present

## 2018-02-20 DIAGNOSIS — Z9841 Cataract extraction status, right eye: Secondary | ICD-10-CM | POA: Diagnosis not present

## 2018-02-20 DIAGNOSIS — I615 Nontraumatic intracerebral hemorrhage, intraventricular: Secondary | ICD-10-CM | POA: Diagnosis present

## 2018-02-20 DIAGNOSIS — I609 Nontraumatic subarachnoid hemorrhage, unspecified: Secondary | ICD-10-CM | POA: Diagnosis present

## 2018-02-20 DIAGNOSIS — C439 Malignant melanoma of skin, unspecified: Secondary | ICD-10-CM | POA: Diagnosis present

## 2018-02-20 DIAGNOSIS — K760 Fatty (change of) liver, not elsewhere classified: Secondary | ICD-10-CM | POA: Diagnosis present

## 2018-02-20 DIAGNOSIS — R569 Unspecified convulsions: Secondary | ICD-10-CM | POA: Diagnosis present

## 2018-02-20 DIAGNOSIS — Z9049 Acquired absence of other specified parts of digestive tract: Secondary | ICD-10-CM | POA: Diagnosis not present

## 2018-02-20 DIAGNOSIS — B964 Proteus (mirabilis) (morganii) as the cause of diseases classified elsewhere: Secondary | ICD-10-CM | POA: Diagnosis present

## 2018-02-20 DIAGNOSIS — R945 Abnormal results of liver function studies: Secondary | ICD-10-CM | POA: Diagnosis not present

## 2018-02-20 DIAGNOSIS — H548 Legal blindness, as defined in USA: Secondary | ICD-10-CM | POA: Diagnosis present

## 2018-02-20 DIAGNOSIS — G4089 Other seizures: Secondary | ICD-10-CM | POA: Diagnosis present

## 2018-02-20 DIAGNOSIS — I480 Paroxysmal atrial fibrillation: Secondary | ICD-10-CM | POA: Diagnosis present

## 2018-02-20 DIAGNOSIS — Z66 Do not resuscitate: Secondary | ICD-10-CM | POA: Diagnosis not present

## 2018-02-20 DIAGNOSIS — Z9842 Cataract extraction status, left eye: Secondary | ICD-10-CM | POA: Diagnosis not present

## 2018-02-20 DIAGNOSIS — D72829 Elevated white blood cell count, unspecified: Secondary | ICD-10-CM | POA: Diagnosis present

## 2018-02-20 LAB — GLUCOSE, CAPILLARY
GLUCOSE-CAPILLARY: 106 mg/dL — AB (ref 70–99)
Glucose-Capillary: 115 mg/dL — ABNORMAL HIGH (ref 70–99)
Glucose-Capillary: 146 mg/dL — ABNORMAL HIGH (ref 70–99)
Glucose-Capillary: 131 mg/dL — ABNORMAL HIGH (ref 70–99)

## 2018-02-20 LAB — URINE CULTURE: Culture: NO GROWTH

## 2018-02-20 LAB — COMPREHENSIVE METABOLIC PANEL
ALT: 47 U/L — ABNORMAL HIGH (ref 0–44)
AST: 24 U/L (ref 15–41)
Albumin: 3.2 g/dL — ABNORMAL LOW (ref 3.5–5.0)
Alkaline Phosphatase: 43 U/L (ref 38–126)
Anion gap: 12 (ref 5–15)
BUN: 24 mg/dL — ABNORMAL HIGH (ref 8–23)
CO2: 23 mmol/L (ref 22–32)
Calcium: 9 mg/dL (ref 8.9–10.3)
Chloride: 102 mmol/L (ref 98–111)
Creatinine, Ser: 1 mg/dL (ref 0.61–1.24)
GFR calc Af Amer: >60 mL/min (ref >60)
GFR calc non Af Amer: >60 mL/min (ref >60)
Glucose, Bld: 122 mg/dL — ABNORMAL HIGH (ref 70–99)
Potassium: 4.6 mmol/L (ref 3.5–5.1)
Sodium: 137 mmol/L (ref 135–145)
Total Bilirubin: 1.3 mg/dL — ABNORMAL HIGH (ref 0.3–1.2)
Total Protein: 6.1 g/dL — ABNORMAL LOW (ref 6.5–8.1)

## 2018-02-20 LAB — CBC
HCT: 45.1 % (ref 39.0–52.0)
Hemoglobin: 15 g/dL (ref 13.0–17.0)
MCH: 28.8 pg (ref 26.0–34.0)
MCHC: 33.3 g/dL (ref 30.0–36.0)
MCV: 86.6 fL (ref 80.0–100.0)
NRBC: 0 % (ref 0.0–0.2)
Platelets: 223 10*3/uL (ref 150–400)
RBC: 5.21 MIL/uL (ref 4.22–5.81)
RDW: 13.6 % (ref 11.5–15.5)
WBC: 13.1 10*3/uL — AB (ref 4.0–10.5)

## 2018-02-20 MED ORDER — ENSURE ENLIVE PO LIQD
237.0000 mL | Freq: Two times a day (BID) | ORAL | Status: DC
  Administered 2018-02-21: 237 mL via ORAL

## 2018-02-20 MED ORDER — SODIUM CHLORIDE 0.9 % IV SOLN: INTRAVENOUS | Status: DC | PRN

## 2018-02-20 NOTE — Evaluation (Addendum)
Clinical/Bedside Swallow Evaluation Patient Details  Name: [name] [name] MRN: [id number] Date of Birth: 12-26-1940  Today's Date: 02/20/2018 Time: SLP Start Time (ACUTE ONLY): 0177 SLP Stop Time (ACUTE ONLY): 1600 SLP Time Calculation (min) (ACUTE ONLY): 22 min  Past Medical History:  Past Medical History:  Diagnosis Date  . Arthritis   . Atrial fibrillation ([place])    previously on Coumadin, reversed with head bleed  . Cataract   . Diabetes mellitus without complication ([place])   . Hyperlipidemia   . HYPERTENSION   . HYPOTHYROIDISM   . 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 Melanoma ([place])    metastatic melanoma with multiple brain mets  . Pre-diabetes   . Precancerous skin lesion    on back  . ROTATOR CUFF TEAR   . Subdural hematoma [facility])    Past Surgical History:  Past Surgical History:  Procedure Laterality Date  . APPENDECTOMY    . CATARACT EXTRACTION W/PHACO Right 02/02/2014   Procedure: CATARACT EXTRACTION PHACO AND INTRAOCULAR LENS PLACEMENT (IOC);  Surgeon: Elta Guadeloupe T. Gershon Crane, MD;  Location: [facility];  Service: Ophthalmology;  Laterality: Right;  CDE 12.17  . CATARACT EXTRACTION W/PHACO Left 02/16/2014   Procedure: CATARACT EXTRACTION PHACO AND INTRAOCULAR LENS PLACEMENT ;  Surgeon: Elta Guadeloupe T. Gershon Crane, MD;  Location: [facility];  Service: Ophthalmology;  Laterality: Left;  CDE:13.42  . CRANIOTOMY N/A 02/05/2017   Procedure: Bilateral Burr Holes . Craniotomy for Subdural;  Surgeon: Ditty, Kevan Ny, MD;  Location: [place];  Service: Neurosurgery;  Laterality: N/A;  . EYE SURGERY  2015   cataract surgery. bilateral  . JOINT REPLACEMENT  2009   bilateral knee replacement  . KNEE SURGERY    . ROTATOR CUFF REPAIR Left   . SHOULDER SURGERY    . TOTAL KNEE ARTHROPLASTY Bilateral    HPI:  77 y.o. male with medical history significant of melanoma with brain mets, subdural hematoma, A. fib, glucose intolerance, hypertension, hyperlipidemia, hypothyroidism  presenting to the hospital on 02/13/2018 for evaluation of generalized weakness.  Upon EMS arrival, noted to have a focal seizure that lasted approximately 4 minutes.  Patient is currently on Decadron and receiving radiation therapy for brain mets.  He was recently admitted from February 13, 2018 to February 16, 2018 for acute encephalopathy thought to be secondary to underlying brain mets and possible UTI and followed by ST for cognitive impairment; BSE generated d/t recent AMS.   Assessment / Plan / Recommendation Clinical Impression   Pt presents with a cognitive-based dysphagia characterized by decreased oral propulsion and oral holding requiring min verbal cueing for propelling bolus posteriorly in oral cavity; no overt s/s of aspiration noted during intake of ice chips, puree and thin via cup/straw (small amounts only); multiple swallows noted with initial swallows of puree/ice chips likely d/t xerostomia from NPO status prior to BSE; recommend initiating a Dysphagia 1 (puree)/thin liquids via cup/straw with small amounts if pt is alert; ST will f/u while in acute setting for potential diet progression/cognitive impairment; thank you for this consult. SLP Visit Diagnosis: Dysphagia, unspecified (R13.10)    Aspiration Risk  Mild aspiration risk    Diet Recommendation   Dysphagia 1/thin liquids (small sips only)  Medication Administration: Whole meds with puree    Other  Recommendations Oral Care Recommendations: Oral care BID   Follow up Recommendations 24 hour supervision/assistance;Other (comment)(TBD)      Frequency and Duration min 2x/week  1 week  Prognosis Prognosis for Safe Diet Advancement: Fair      Swallow Study   General Date of Onset: 02/04/2018 HPI: 77 y.o. male with medical history significant of melanoma with brain mets, subdural hematoma, A. fib, glucose intolerance, hypertension, hyperlipidemia, hypothyroidism presenting to the hospital for evaluation of  generalized weakness.  Upon EMS arrival, noted to have a focal seizure that lasted approximately 4 minutes.  Patient is currently on Decadron and receiving radiation therapy for brain mets.  He was recently admitted from February 13, 2018 to February 16, 2018 for acute encephalopathy thought to be secondary to underlying brain mets and possible UTI Type of Study: Bedside Swallow Evaluation Previous Swallow Assessment: (n/a) Diet Prior to this Study: NPO;Other (Comment)(d/t focal seizure/alertness level) Temperature Spikes Noted: No Respiratory Status: Room air History of Recent Intubation: No Behavior/Cognition: Cooperative;Alert Oral Cavity Assessment: Dry Oral Care Completed by SLP: Yes Oral Cavity - Dentition: Adequate natural dentition Vision: Functional for self-feeding Self-Feeding Abilities: Able to feed self;Needs assist Patient Positioning: Upright in bed Baseline Vocal Quality: Low vocal intensity;Other (comment)(limited verbalizations noted) Volitional Cough: Other (Comment)(did not observe) Volitional Swallow: Able to elicit    Oral/Motor/Sensory Function Overall Oral Motor/Sensory Function: Generalized oral weakness Facial Symmetry: Within Functional Limits   Ice Chips Ice chips: Impaired Presentation: Spoon Oral Phase Impairments: Reduced lingual movement/coordination Oral Phase Functional Implications: Prolonged oral transit   Thin Liquid Thin Liquid: Impaired Presentation: Cup;Straw Oral Phase Functional Implications: Oral holding    Nectar Thick Nectar Thick Liquid: Not tested   Honey Thick Honey Thick Liquid: Not tested   Puree Puree: Impaired Presentation: Spoon Oral Phase Impairments: Reduced lingual movement/coordination Oral Phase Functional Implications: Prolonged oral transit Pharyngeal Phase Impairments: Multiple swallows   Solid     Solid: Not tested      Elvina Sidle, M.S., CCC-SLP 02/20/2018,4:13 PM

## 2018-02-20 NOTE — Progress Notes (Signed)
PROGRESS NOTE    [name] [name]  UDJ:[id number] DOB: 12/03/1940 DOA: 02/21/2018 PCP: Dettinger, [name], MD   Brief Narrative:  HPI on 12/24/219 by Dr. Shela Leff [name] [name] is a 77 y.o. male with medical history significant of melanoma with brain mets, subdural hematoma, A. fib, glucose intolerance, hypertension, hyperlipidemia, hypothyroidism presenting to the hospital for evaluation of generalized weakness.  Upon EMS arrival, noted to have a focal seizure that lasted approximately 4 minutes.  Patient is currently on Decadron and receiving radiation therapy for brain mets.  He was recently admitted from February 13, 2018 to February 16, 2018 for acute encephalopathy thought to be secondary to underlying brain mets and possible UTI.  Urine culture was positive for Proteus and patient was discharged with ciprofloxacin.  Patient was somnolent, as such, history was provided by wife at bedside.  Wife states that the family was trying to get to the patient to the bathroom today but even before they could get him up from the chair they noticed that he slumped backwards in the chair and lost consciousness for a few minutes.  They believe patient has been confused for the past few days.  When EMS arrived they noticed that the patient's body was stiff and they thought he was having a seizure.  Family denies any prior history of seizures.  They are not sure if the patient was incontinent at that time.  He does take Decadron daily.  Wife states that since his recent hospital discharge patient has been very lethargic and irritable.  He never went back to his baseline.  He has not been having any fevers.  He has been receiving ciprofloxacin at home and is at the end of the UTI treatment course.   Interim history Admitted with seizure, altered mental status. PT recommending SNF. Assessment & Plan   Melanoma with brain metastasis, intraventricular hemorrhage, subarachnoid hemorrhage, seizure -CT  head showed scattered intracranial metastatic lesions with increased amount of intraventricular hemorrhage in the occipital horns of the lateral ventricles compared to CT done February 13, 2018.  Also showing mild but increased bilateral subarachnoid hemorrhage along the parietal lobes bilaterally. -EDP discussed with Dr. Burr Medico from oncology, recommended increasing dose of Decadron. -Admitting physician discussed with Dr. Trenton Gammon, neurosurgery, who recommended giving Decadron 4 mg every 6 hours.  Feels CT findings are stable, no urgent surgical need and neurosurgery can be consulted if needed. -Continue Keppra 500 mg IV twice daily -Of note, patient was post to have his last dose of radiation today.  Will discuss whether radiation oncology.  Leukocytosis -Currently afebrile -Patient was recently treated for UTI (Proteus 02/14/2018) and is now at the end of his treatment course -Suspect leukocytosis secondary to steroid use -WBC trending downward -Repeat urine culture showed no growth -Continue to monitor CBC  Mild Hyponatremia -Resolved -sodium 133- currently up to 137 -continue IVF  -Continue BMP  Elevated LFTs -MRI abdomen, January 26, 2018, showed mild diffuse hepatic steatosis.  Normal gallbladder with no cholelithiasis.  No biliary ductal dilatation or choledocholithiasis.  Common bile duct diameter 3 mm. -Trending downward -Continue to monitor   Paroxsymal atrial fibrillation -Currently in sinus rhythm -currently not anticoagulation due to history of intracranial hemorrhage and subarachnoid hematoma -Flecainide currently held patient currently n.p.o.  Hyperglycemia -Hemoglobin 6.27 October 2017 -Suspect secondary to steroids -Continue sliding scale and CBG monitoring  Essential hypertension -Continue hydralazine as needed  Hypothyroidism -Continue IV Synthroid  Reactive airway disease -Continue albuterol as needed.  Currently  stable, no wheezing on exam  Physical  deconditioning/generalized weakness -Suspect secondary to metastatic cancer -PT consulted- recommended SNF  ?Dysphagia -Speech therapy consulted for evaluation -Continue IV fluids and medications for now  DVT Prophylaxis  SCDs  Code Status: Full  Family Communication: Wife at bedside  Disposition Plan: Currently in observation. Feel patient should be inpatient as he is currently receiving IV fluids and medications given that he is   Consultants Oncology, Dr. Burr Medico, by EDP (via phone) Neurosurgery, Dr. Trenton Gammon, by Dr. Marlowe Sax (via phone) Radiation oncology  Procedures  None  Antibiotics   Anti-infectives (From admission, onward)   None      Subjective:   [name] [name] seen and examined today.  Continues to be altered.  Patient's wife at bedside, states this is his baseline or has been lately.  Patient has no complaints this morning.  Objective:   Vitals:   02/19/18 2341 02/20/18 0323 02/20/18 0719 02/20/18 1107  BP: 137/76 (!) 141/78 (!) 159/90 (!) 156/97  Pulse: 79 80 74 84  Resp: 18 18 18 18   Temp: 98.5 F (36.9 C) 98 F (36.7 C) 98.3 F (36.8 C) 97.6 F (36.4 C)  TempSrc: Oral  Axillary Axillary  SpO2: 95% 92% 96% 95%  Weight:      Height:        Intake/Output Summary (Last 24 hours) at 02/20/2018 1216 Last data filed at 02/20/2018 0600 Gross per 24 hour  Intake 376.2 ml  Output 625 ml  Net -248.8 ml   Filed Weights   02/08/2018 1432 02/11/2018 2207  Weight: 117.8 kg 121.9 kg   Exam  General: Well developed, likely ill-appearing, NAD  HEENT: NCAT, mucous membranes moist.   Neck: Supple  Cardiovascular: S1 S2 auscultated, no murmur, RRR  Respiratory: Clear to auscultation bilaterally with equal chest rise  Abdomen: Soft, nontender, nondistended, + bowel sounds  Extremities: warm dry without cyanosis clubbing or edema  Neuro: AAOx2 (self, place), nonfocal  Psych: Appropriate, pleasantly confused  Data Reviewed: I have personally reviewed  following labs and imaging studies  CBC: Recent Labs  Lab 02/13/18 2309 02/14/18 0541 02/17/2018 1513 02/20/18 0426  WBC 10.0 11.3* 15.6* 13.1*  NEUTROABS  --  10.1* 12.8*  --   HGB 15.8 14.5 16.7 15.0  HCT 47.6 43.8 50.2 45.1  MCV 87.3 88.0 86.0 86.6  PLT 134* 133* 217 518   Basic Metabolic Panel: Recent Labs  Lab 02/13/18 2309 02/14/18 0541 02/16/18 0547 02/03/2018 1513 02/20/18 0426  NA 138 136 136 132* 137  K 4.0 4.1 3.9 3.6 4.6  CL 103 102 102 98 102  CO2 19* 21* 20* 21* 23  GLUCOSE 130* 189* 111* 145* 122*  BUN 18 17 22 20  24*  CREATININE 1.23 1.30* 0.98 1.11 1.00  CALCIUM 9.0 8.5* 8.4* 9.3 9.0  MG  --  2.0  --   --   --    GFR: Estimated Creatinine Clearance: 82.3 mL/min (by C-G formula based on SCr of 1 mg/dL). Liver Function Tests: Recent Labs  Lab 02/13/18 2309 02/14/18 0541 02/11/2018 1516 02/20/18 0426  AST 29 25 30 24   ALT 44 39 62* 47*  ALKPHOS 55 47 53 43  BILITOT 1.0 0.8 1.4* 1.3*  PROT 6.8 6.2* 7.4 6.1*  ALBUMIN 3.6 3.1* 3.8 3.2*   No results for input(s): LIPASE, AMYLASE in the last 168 hours. No results for input(s): AMMONIA in the last 168 hours. Coagulation Profile: No results for input(s): INR, PROTIME in the last 168  hours. Cardiac Enzymes: No results for input(s): CKTOTAL, CKMB, CKMBINDEX, TROPONINI in the last 168 hours. BNP (last 3 results) No results for input(s): PROBNP in the last 8760 hours. HbA1C: No results for input(s): HGBA1C in the last 72 hours. CBG: Recent Labs  Lab 02/19/18 1118 02/19/18 1642 02/19/18 2108 02/20/18 0602 02/20/18 1105  GLUCAP 116* 113* 116* 106* 115*   Lipid Profile: No results for input(s): CHOL, HDL, LDLCALC, TRIG, CHOLHDL, LDLDIRECT in the last 72 hours. Thyroid Function Tests: No results for input(s): TSH, T4TOTAL, FREET4, T3FREE, THYROIDAB in the last 72 hours. Anemia Panel: No results for input(s): VITAMINB12, FOLATE, FERRITIN, TIBC, IRON, RETICCTPCT in the last 72 hours. Urine  analysis:    Component Value Date/Time   COLORURINE YELLOW 02/14/2018 0221   APPEARANCEUR TURBID (A) 02/14/2018 0221   LABSPEC <1.005 (L) 02/14/2018 0221   PHURINE >9.0 (H) 02/14/2018 0221   GLUCOSEU NEGATIVE 02/14/2018 0221   HGBUR TRACE (A) 02/14/2018 0221   BILIRUBINUR SMALL (A) 02/14/2018 0221   KETONESUR NEGATIVE 02/14/2018 0221   PROTEINUR >300 (A) 02/14/2018 0221   UROBILINOGEN 1.0 05/11/2010 1145   NITRITE POSITIVE (A) 02/14/2018 0221   LEUKOCYTESUR LARGE (A) 02/14/2018 0221   Sepsis Labs: @LABRCNTIP (procalcitonin:4,lacticidven:4)  ) Recent Results (from the past 240 hour(s))  Culture, Urine     Status: Abnormal   Collection Time: 02/12/18  1:55 PM  Result Value Ref Range Status   Specimen Description   Final    URINE, CLEAN CATCH Performed at [facility], [place] [address], [place], [place] [address]    Special Requests   Final    NONE Performed at [facility], [place] [address] [place], [place] [address]    Culture >=100,000 COLONIES/mL PROTEUS MIRABILIS (A)  Final   Report Status 02/14/2018 FINAL  Final   Organism ID, Bacteria PROTEUS MIRABILIS (A)  Final      Susceptibility   Proteus mirabilis - MIC*    AMPICILLIN >=32 RESISTANT Resistant     CEFAZOLIN >=64 RESISTANT Resistant     CEFTRIAXONE RESISTANT Resistant     CIPROFLOXACIN <=0.25 SENSITIVE Sensitive     GENTAMICIN <=1 SENSITIVE Sensitive     IMIPENEM 4 SENSITIVE Sensitive     NITROFURANTOIN 128 RESISTANT Resistant     TRIMETH/SULFA <=20 SENSITIVE Sensitive     AMPICILLIN/SULBACTAM >=32 RESISTANT Resistant     PIP/TAZO <=4 SENSITIVE Sensitive     * >=100,000 COLONIES/mL PROTEUS MIRABILIS  Urine culture     Status: Abnormal   Collection Time: 02/14/18  2:21 AM  Result Value Ref Range Status   Specimen Description URINE, RANDOM  Final   Special Requests   Final    NONE Performed at [facility], [place] [address] [place], [place] [address]     Culture 40,000 COLONIES/mL PROTEUS MIRABILIS (A)  Final   Report Status 02/17/2018 FINAL  Final   Organism ID, Bacteria PROTEUS MIRABILIS (A)  Final      Susceptibility   Proteus mirabilis - MIC*    AMPICILLIN >=32 RESISTANT Resistant     CEFAZOLIN >=64 RESISTANT Resistant     CEFTRIAXONE RESISTANT Resistant     CIPROFLOXACIN <=0.25 SENSITIVE Sensitive     GENTAMICIN <=1 SENSITIVE Sensitive     IMIPENEM 2 SENSITIVE Sensitive     NITROFURANTOIN 128 RESISTANT Resistant     TRIMETH/SULFA <=20 SENSITIVE Sensitive     AMPICILLIN/SULBACTAM >=32 RESISTANT Resistant     PIP/TAZO <=4 SENSITIVE Sensitive     *  40,000 COLONIES/mL PROTEUS MIRABILIS  Culture, Urine     Status: None   Collection Time: 02/19/18  5:21 AM  Result Value Ref Range Status   Specimen Description URINE, RANDOM  Final   Special Requests NONE  Final   Culture   Final    NO GROWTH Performed at [facility], [address], [place], [place] [address]    Report Status 02/20/2018 FINAL  Final      Radiology Studies: Ct Head Wo Contrast  Result Date: 01/31/2018 CLINICAL DATA:  Weakness. Seizure. Metastatic melanoma. Recent radiation therapy for brain tumors. Craniotomy for subdural 02/05/2017. EXAM: CT HEAD WITHOUT CONTRAST TECHNIQUE: Contiguous axial images were obtained from the base of the skull through the vertex without intravenous contrast. COMPARISON:  02/13/2018 FINDINGS: Brain: (Formerly the same on 02/13/2018 by my measurements), a 1.6 by 1.2 cm left inferior frontal lobe lesion (stable), a mixed density 2.9 by 2.6 cm lesion in the left frontal lobe on image 20/2 (stable), a 4.2 by 2.0 cm right frontal mass (previously measuring 3.6 by 3.0 cm but with a somewhat different orientation today and with different slice orientation). Bilateral intraventricular hemorrhage in the occipital horns of the lateral ventricles, slightly increased in volume subjectively. Subarachnoid hemorrhage along both parietal lobes for  example on image 19/2, thought to be slightly increased. Cystic cavity along the margin of the dominant right frontal lobe metastatic lesion, potentially with a small amount of blood within the cavity, and with questionable connectivity of this cystic lesion with the frontal horn of the right lateral ventricle. Mild hydrocephalus.  No midline shift. Vascular: Unremarkable Skull: Old left craniotomy.  Old bilateral burr holes. Sinuses/Orbits: Chronic bilateral maxillary sinusitis. Small bilateral mastoid effusions. Orbits unremarkable. Other: No supplemental non-categorized findings. IMPRESSION: 1. Scattered intracranial metastatic lesions mostly similar to the exam from 8 days ago. The dominant right frontal metastatic lesion has changed slightly in orientation but is probably roughly stable. However, the amount of intraventricular hemorrhage in the occipital horns of the lateral ventricles appears to have increased compared to 02/13/2018, and there is also some mild but increased bilateral subarachnoid hemorrhage along the parietal lobes bilaterally. Stable mild hydrocephalus. Stable postoperative findings in the cranium. 2. Chronic bilateral maxillary sinusitis with small bilateral mastoid effusions. Electronically Signed   By: Van Clines M.D.   On: 02/12/2018 15:55     Scheduled Meds: . dexamethasone  4 mg Intravenous Q6H  . insulin aspart  0-9 Units Subcutaneous TID WC  . levothyroxine  50 mcg Intravenous Daily   Continuous Infusions: . sodium chloride 10 mL/hr at 02/19/18 1207  . levETIRAcetam 500 mg (02/20/18 0600)     LOS: 0 days   Time Spent in minutes  30 minutes  Shameca Landen D.O. on 02/20/2018 at 12:16 PM  Between 7am to 7pm - Please see pager noted on [email or web address]  After 7pm go to [email or web address]  And look for the night coverage person covering for me after hours  [facility] Office  [phone]

## 2018-02-20 NOTE — Consult Note (Addendum)
   [facility] Inpatient Consult   02/20/2018  DAXSON REFFETT October 14, 1940 [id number]   Patient screened for potential [facility] Care Management services due to multiple hospitalizations.   Went to bedside to speak with Mr. Chait and wife Peter Congo about [facility] Management engagement. Mr. Wiberg is somewhat confused. Loris Winrow (wife) was agreeable and [facility] Care Management written consent obtained. [facility] folder provided.   Mrs. Mahl reports the discharge plan is for SNF. She states she is not certain which facility. Discussed that writer will make appropriate [facility] referrals once disposition is known.   Mrs. Hudock confirms Primary Care MD is Dr. Warrick Parisian of [place] (PCP office listed as doing transition of care call post discharge).  Mrs. Molinelli also reports that transportation and affording medications are concerns for them. States patient's medications are sometimes so expensive that "we have to leave them at the pharmacy."  Durrel Mcnee (wife) indicates she should be contacted for post discharge calls. Home number [phone]; cell numbers [phone] and [phone].  Will continue to follow for disposition plans and make appropriate [facility] referrals once disposition is known.  Made inpatient RNCM aware [facility] will follow.    Marthenia Rolling, MSN-Ed, RN,BSN [facility] Liaison [phone]

## 2018-02-20 NOTE — Social Work (Signed)
Insurance authorization initiated with [employer].   Westley Hummer, MSW, [place] Work [phone]

## 2018-02-20 NOTE — NC FL2 (Signed)
[place] LEVEL OF CARE SCREENING TOOL     IDENTIFICATION  Patient Name: [name] [name] Birthdate: 03/29/1940 Sex: male Admission Date (Current Location): 02/03/2018  [facility] [place] Number:  [employer] and Address:  The [place], [place] [address] [place], [place] [address]      Provider Number: [id number]  Attending Physician Name and Address:  Cristal Ford, DO  Relative Name and Phone Number:  August Longest; wife; [phone]    Current Level of Care: Hospital Recommended Level of Care: [place] Prior Approval Number:    Date Approved/Denied:   PASRR Number: [id number]  Discharge Plan:   SNF   Current Diagnoses: Patient Active Problem List   Diagnosis Date Noted  . Seizures ([place]) 02/12/2018  . Elevated LFTs 02/17/2018  . Glucose intolerance 01/29/2018  . Physical deconditioning 01/27/2018  . Acute encephalopathy 02/14/2018  . Acute lower UTI 02/14/2018  . Brain metastases ([place])   . Lesion of adrenal gland ([place])   . Precancerous skin lesion   . ICH (intracerebral hemorrhage) ([place]) 01/23/2018  . Warfarin anticoagulation   . H/O subdural hemorrhage   . History of burr hole surgery   . Leukocytosis 02/05/2017  . Hyponatremia 02/05/2017  . Somnolence 02/05/2017  . Headache 01/28/2017  . Subdural hematoma ([place]) 01/23/2017  . Pre-diabetes 09/14/2015  . Paroxysmal atrial fibrillation ([place]) 03/30/2015  . HLD (hyperlipidemia) 03/02/2015  . Hypothyroidism 06/26/2008  . Essential hypertension 06/26/2008  . ROTATOR CUFF TEAR 06/26/2008    Orientation RESPIRATION BLADDER Height & Weight     Self, Place  Normal Incontinent, External catheter Weight: 268 lb 11.9 oz (121.9 kg) Height:  5\' 9"  (175.3 cm)  BEHAVIORAL SYMPTOMS/MOOD NEUROLOGICAL BOWEL NUTRITION STATUS      Continent Diet(see discharge summary)  AMBULATORY STATUS COMMUNICATION OF NEEDS Skin   Extensive Assist Verbally Surgical  wounds, PU Stage and Appropriate Care   PU Stage 2 Dressing: (on buttocks with foam dressing)                   Personal Care Assistance Level of Assistance  Bathing, Feeding, Dressing Bathing Assistance: Maximum assistance Feeding assistance: Limited assistance Dressing Assistance: Maximum assistance     Functional Limitations Info  Sight, Hearing, Speech Sight Info: 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) Hearing Info: Adequate Speech Info: Impaired    SPECIAL CARE FACTORS FREQUENCY  PT (By licensed PT), OT (By licensed OT)     PT Frequency: 5x week OT Frequency: 5x week            Contractures Contractures Info: Not present    Additional Factors Info  Code Status, Allergies, Insulin Sliding Scale Code Status Info: Full Code Allergies Info: No Known Allergies    Insulin Sliding Scale Info: insulin aspart (novoLOG) injection 0-9 Units 3x day with meals       Current Medications (02/20/2018):  This is the current hospital active medication list Current Facility-Administered Medications  Medication Dose Route Frequency Provider Last Rate Last Dose  . 0.9 %  sodium chloride infusion   Intravenous PRN Mikhail, Velta Addison, DO      . albuterol (PROVENTIL) (2.5 MG/3ML) 0.083% nebulizer solution 2.5 mg  2.5 mg Nebulization Q6H PRN Shela Leff, MD      . dexamethasone (DECADRON) injection 4 mg  4 mg Intravenous Q6H Shela Leff, MD   4 mg at 02/20/18 1133  . hydrALAZINE (APRESOLINE) injection 5 mg  5 mg Intravenous Q4H PRN  Shela Leff, MD   5 mg at 02/19/2018 2042  . insulin aspart (novoLOG) injection 0-9 Units  0-9 Units Subcutaneous TID WC Shela Leff, MD      . levETIRAcetam (KEPPRA) IVPB 500 mg/100 mL premix  500 mg Intravenous Q12H Shela Leff, MD 400 mL/hr at 02/20/18 0600 500 mg at 02/20/18 0600  . levothyroxine (SYNTHROID, LEVOTHROID) injection 50 mcg  50 mcg Intravenous Daily Shela Leff, MD   50 mcg at 02/20/18 0539      Discharge Medications: Please see discharge summary for a list of discharge medications.  Relevant Imaging Results:  Relevant Lab Results:   Additional Information [id number] [place], [place]

## 2018-02-21 LAB — GLUCOSE, CAPILLARY
GLUCOSE-CAPILLARY: 139 mg/dL — AB (ref 70–99)
Glucose-Capillary: 138 mg/dL — ABNORMAL HIGH (ref 70–99)
Glucose-Capillary: 125 mg/dL — ABNORMAL HIGH (ref 70–99)
Glucose-Capillary: 144 mg/dL — ABNORMAL HIGH (ref 70–99)

## 2018-02-21 MED ORDER — LEVOTHYROXINE SODIUM 100 MCG PO TABS
100.0000 ug | ORAL_TABLET | Freq: Every day | ORAL | Status: DC
  Administered 2018-02-22 – 2018-02-23 (×2): 100 ug via ORAL
  Filled 2018-02-21 (×2): qty 1

## 2018-02-21 MED ORDER — FLECAINIDE ACETATE 100 MG PO TABS
100.0000 mg | ORAL_TABLET | Freq: Two times a day (BID) | ORAL | Status: DC
  Administered 2018-02-21 – 2018-02-23 (×5): 100 mg via ORAL
  Filled 2018-02-21 (×7): qty 1

## 2018-02-21 MED ORDER — LEVETIRACETAM 500 MG PO TABS
500.0000 mg | ORAL_TABLET | Freq: Two times a day (BID) | ORAL | Status: DC
  Administered 2018-02-21 – 2018-02-23 (×4): 500 mg via ORAL
  Filled 2018-02-21 (×6): qty 1

## 2018-02-21 NOTE — Clinical Social Work Note (Signed)
Clinical Social Work Assessment  Patient Details  Name: [name] [name] MRN: [id number] Date of Birth: 10/18/1940  Date of referral:  02/21/18               Reason for consult:  Facility Placement                Permission sought to share information with:  Chartered certified accountant granted to share information::  Yes, Verbal Permission Granted  Name::     Music therapist::  SNF  Relationship::  [name] [name]  Contact Information:     Housing/Transportation Living arrangements for the past 2 months:  [place] of Information:  Medical Team, Spouse Patient Interpreter Needed:  None Criminal Activity/Legal Involvement Pertinent to Current Situation/Hospitalization:  No - Comment as needed Significant Relationships:  Spouse Lives with:  Self, Spouse Do you feel safe going back to the place where you live?  Yes Need for family participation in patient care:  Yes (Comment)  Care giving concerns:  Patient from home with [name] [name], but will need short term rehab at discharge.    Social Worker assessment / plan:  CSW met with patient and [name] [name] to discuss recommendation for SNF. CSW discussed recommendations for SNF placement, and patient and [name] [name] would prefer [facility] Patient's [name] [name] asked about what the doctors were planning on doing with his cancer treatment, and CSW told the [name] [name] she'd have to ask the doctor. Patient's [name] [name] says she hasn't seen anyone other than the hospitalist. CSW sent referral to [facility], awaiting response.  Employment status:  Retired Nurse, adult PT Recommendations:  [place] / Referral to community resources:  [place]  Patient/Family's Response to care:  Patient and [name] [name] agreeable to SNF placement.  Patient/Family's Understanding of and Emotional Response to Diagnosis, Current Treatment, and Prognosis:  Patient attempted to engage in discussion but was having  difficulty forming words, gave permission for [name] [name] to discuss. Patient's [name] [name] acknowledged that patient would have to go to SNF, as she can't take care of him at home. Patient's [name] [name] appreciative of CSW assistance.  Emotional Assessment Appearance:  Appears stated age Attitude/Demeanor/Rapport:  Unable to Assess Affect (typically observed):  Unable to Assess Orientation:  Oriented to Self, Oriented to Place Alcohol / Substance use:  Not Applicable Psych involvement (Current and /or in the community):  No (Comment)  Discharge Needs  Concerns to be addressed:  Care Coordination Readmission within the last 30 days:  No Current discharge risk:  Physical Impairment, Dependent with Mobility Barriers to Discharge:  Continued Medical Work up, [place], [place] 02/21/2018, 1:17 PM

## 2018-02-21 NOTE — Progress Notes (Signed)
Nutrition Brief Note  Patient identified on the Malnutrition Screening Tool (MST) Report  Wt Readings from Last 15 Encounters:  01/29/2018 121.9 kg  02/14/18 117.9 kg  02/12/18 120.1 kg  02/11/18 120 kg  02/04/18 122.7 kg  01/22/18 124.7 kg  01/22/18 124.7 kg  12/10/17 128.9 kg  12/02/17 131.5 kg  11/28/17 128 kg  10/30/17 135.2 kg  09/17/17 133.4 kg  08/30/17 133.8 kg  08/06/17 131.3 kg  06/25/17 130.4 kg    Body mass index is 39.69 kg/m. Patient meets criteria for class II obesity based on current BMI.   Current diet order is dysphagia 2 with thin liquids, patient is consuming approximately 75-100% of meals at this time. Wife at bedside reports pt's appetite and intake has been good currently. Labs and medications reviewed. Ensure has been ordered. RD to discontinue orders as intake at meals have been adequate.   No nutrition interventions warranted at this time. If nutrition issues arise, please consult RD.   Corrin Parker, MS, RD, LDN Pager # [phone] After hours/ weekend pager # [phone]

## 2018-02-21 NOTE — Progress Notes (Signed)
PROGRESS NOTE    [name] [name]  ZOX:[id number] DOB: 03-Jul-1940 DOA: 02/08/2018 PCP: Dettinger, [name], MD   Brief Narrative:  HPI on 12/24/219 by Dr. Shela Leff [name] [name] is a 77 y.o. male with medical history significant of melanoma with brain mets, subdural hematoma, A. fib, glucose intolerance, hypertension, hyperlipidemia, hypothyroidism presenting to the hospital for evaluation of generalized weakness.  Upon EMS arrival, noted to have a focal seizure that lasted approximately 4 minutes.  Patient is currently on Decadron and receiving radiation therapy for brain mets.  He was recently admitted from February 13, 2018 to February 16, 2018 for acute encephalopathy thought to be secondary to underlying brain mets and possible UTI.  Urine culture was positive for Proteus and patient was discharged with ciprofloxacin.  Patient was somnolent, as such, history was provided by wife at bedside.  Wife states that the family was trying to get to the patient to the bathroom today but even before they could get him up from the chair they noticed that he slumped backwards in the chair and lost consciousness for a few minutes.  They believe patient has been confused for the past few days.  When EMS arrived they noticed that the patient's body was stiff and they thought he was having a seizure.  Family denies any prior history of seizures.  They are not sure if the patient was incontinent at that time.  He does take Decadron daily.  Wife states that since his recent hospital discharge patient has been very lethargic and irritable.  He never went back to his baseline.  He has not been having any fevers.  He has been receiving ciprofloxacin at home and is at the end of the UTI treatment course.   Interim history Admitted with seizure, altered mental status. PT recommending SNF. Assessment & Plan   Melanoma with brain metastasis, intraventricular hemorrhage, subarachnoid hemorrhage, seizure -CT  head showed scattered intracranial metastatic lesions with increased amount of intraventricular hemorrhage in the occipital horns of the lateral ventricles compared to CT done February 13, 2018.  Also showing mild but increased bilateral subarachnoid hemorrhage along the parietal lobes bilaterally. -EDP discussed with Dr. Burr Medico from oncology, recommended increasing dose of Decadron. -Admitting physician discussed with Dr. Trenton Gammon, neurosurgery, who recommended giving Decadron 4 mg every 6 hours.  Feels CT findings are stable, no urgent surgical need and neurosurgery can be consulted if needed. -Continue Keppra 500 mg- will transition to oral -Patient completed radiation therapy on 02/20/2018 -Will discuss further treatment plans with Dr. Irene Limbo, oncology  Leukocytosis -Currently afebrile -Patient was recently treated for UTI (Proteus 02/14/2018) and is now at the end of his treatment course -Suspect leukocytosis secondary to steroid use -WBC trending downward -Repeat urine culture showed no growth -Continue to monitor CBC  Mild Hyponatremia -Resolved -sodium 133- currently up to 139 -continue IVF  -Continue BMP  Elevated LFTs -MRI abdomen, January 26, 2018, showed mild diffuse hepatic steatosis.  Normal gallbladder with no cholelithiasis.  No biliary ductal dilatation or choledocholithiasis.  Common bile duct diameter 3 mm. -Trending downward -Continue to monitor   Paroxsymal atrial fibrillation -Currently in sinus rhythm -currently not anticoagulation due to history of intracranial hemorrhage and subarachnoid hematoma -will restart flecainide  Hyperglycemia -Hemoglobin 6.27 October 2017 -Suspect secondary to steroids -Continue sliding scale and CBG monitoring  Essential hypertension -Continue hydralazine as needed  Hypothyroidism -We will transition to oral Synthroid  Reactive airway disease -Continue albuterol as needed.  Currently stable, no wheezing  on exam  Physical  deconditioning/generalized weakness -Suspect secondary to metastatic cancer -PT consulted- recommended SNF  Dysphagia -Speech therapy consulted for evaluation, transition to dysphasia 2 diet  DVT Prophylaxis  SCDs  Code Status: Full  Family Communication: Wife at bedside  Disposition Plan: Admitted.  Patient's diet increased today.  Currently pending SNF placement.  Consultants Oncology, Dr. Burr Medico, by EDP (via phone) Neurosurgery, Dr. Trenton Gammon, by Dr. Marlowe Sax (via phone) Radiation oncology  Procedures  None  Antibiotics   Anti-infectives (From admission, onward)   None      Subjective:   [name] [name] seen and examined today.  Patient feeling better today but continues to be altered.  Per wife, this is been patient's baseline lately.  No complaints this morning.   Objective:   Vitals:   02/20/18 1959 02/20/18 2311 02/21/18 0337 02/21/18 0732  BP: (!) 155/90 (!) 140/98 (!) 167/87 (!) 160/84  Pulse: 80 86 83 81  Resp:  18 20   Temp:  98.5 F (36.9 C) 97.7 F (36.5 C) 98.7 F (37.1 C)  TempSrc:  Oral Oral Oral  SpO2:  90% 94% 97%  Weight:      Height:        Intake/Output Summary (Last 24 hours) at 02/21/2018 1414 Last data filed at 02/20/2018 1513 Gross per 24 hour  Intake 171.32 ml  Output -  Net 171.32 ml   Filed Weights   02/19/2018 1432 02/12/2018 2207  Weight: 117.8 kg 121.9 kg   Exam  General: Well developed, chronically ill appearing, NAD  HEENT: NCAT, Pmucous membranes moist.   Neck: Supple  Cardiovascular: S1 S2 auscultated, no murmur, RRR  Respiratory: Clear to auscultation bilaterally with equal chest rise  Abdomen: Soft, nontender, nondistended, + bowel sounds  Extremities: warm dry without cyanosis clubbing or edema  Neuro: AAOx2 (self, place), nonfocal  Data Reviewed: I have personally reviewed following labs and imaging studies  CBC: Recent Labs  Lab 02/04/2018 1513 02/20/18 0426  WBC 15.6* 13.1*  NEUTROABS 12.8*  --   HGB 16.7  15.0  HCT 50.2 45.1  MCV 86.0 86.6  PLT 217 161   Basic Metabolic Panel: Recent Labs  Lab 02/16/18 0547 02/23/2018 1513 02/20/18 0426  NA 136 132* 137  K 3.9 3.6 4.6  CL 102 98 102  CO2 20* 21* 23  GLUCOSE 111* 145* 122*  BUN 22 20 24*  CREATININE 0.98 1.11 1.00  CALCIUM 8.4* 9.3 9.0   GFR: Estimated Creatinine Clearance: 82.3 mL/min (by C-G formula based on SCr of 1 mg/dL). Liver Function Tests: Recent Labs  Lab 02/23/2018 1516 02/20/18 0426  AST 30 24  ALT 62* 47*  ALKPHOS 53 43  BILITOT 1.4* 1.3*  PROT 7.4 6.1*  ALBUMIN 3.8 3.2*   No results for input(s): LIPASE, AMYLASE in the last 168 hours. No results for input(s): AMMONIA in the last 168 hours. Coagulation Profile: No results for input(s): INR, PROTIME in the last 168 hours. Cardiac Enzymes: No results for input(s): CKTOTAL, CKMB, CKMBINDEX, TROPONINI in the last 168 hours. BNP (last 3 results) No results for input(s): PROBNP in the last 8760 hours. HbA1C: No results for input(s): HGBA1C in the last 72 hours. CBG: Recent Labs  Lab 02/20/18 1105 02/20/18 1650 02/20/18 2140 02/21/18 0621 02/21/18 1137  GLUCAP 115* 146* 131* 138* 139*   Lipid Profile: No results for input(s): CHOL, HDL, LDLCALC, TRIG, CHOLHDL, LDLDIRECT in the last 72 hours. Thyroid Function Tests: No results for input(s): TSH, T4TOTAL, FREET4, T3FREE,  THYROIDAB in the last 72 hours. Anemia Panel: No results for input(s): VITAMINB12, FOLATE, FERRITIN, TIBC, IRON, RETICCTPCT in the last 72 hours. Urine analysis:    Component Value Date/Time   COLORURINE YELLOW 02/14/2018 0221   APPEARANCEUR TURBID (A) 02/14/2018 0221   LABSPEC <1.005 (L) 02/14/2018 0221   PHURINE >9.0 (H) 02/14/2018 0221   GLUCOSEU NEGATIVE 02/14/2018 0221   HGBUR TRACE (A) 02/14/2018 0221   BILIRUBINUR SMALL (A) 02/14/2018 0221   KETONESUR NEGATIVE 02/14/2018 0221   PROTEINUR >300 (A) 02/14/2018 0221   UROBILINOGEN 1.0 05/11/2010 1145   NITRITE POSITIVE (A)  02/14/2018 0221   LEUKOCYTESUR LARGE (A) 02/14/2018 0221   Sepsis Labs: @LABRCNTIP (procalcitonin:4,lacticidven:4)  ) Recent Results (from the past 240 hour(s))  Culture, Urine     Status: Abnormal   Collection Time: 02/12/18  1:55 PM  Result Value Ref Range Status   Specimen Description   Final    URINE, CLEAN CATCH Performed at [facility], [place] [address] [place], [place] [address]    Special Requests   Final    NONE Performed at [facility], [place] [address] [place], [place] [address]    Culture >=100,000 COLONIES/mL PROTEUS MIRABILIS (A)  Final   Report Status 02/14/2018 FINAL  Final   Organism ID, Bacteria PROTEUS MIRABILIS (A)  Final      Susceptibility   Proteus mirabilis - MIC*    AMPICILLIN >=32 RESISTANT Resistant     CEFAZOLIN >=64 RESISTANT Resistant     CEFTRIAXONE RESISTANT Resistant     CIPROFLOXACIN <=0.25 SENSITIVE Sensitive     GENTAMICIN <=1 SENSITIVE Sensitive     IMIPENEM 4 SENSITIVE Sensitive     NITROFURANTOIN 128 RESISTANT Resistant     TRIMETH/SULFA <=20 SENSITIVE Sensitive     AMPICILLIN/SULBACTAM >=32 RESISTANT Resistant     PIP/TAZO <=4 SENSITIVE Sensitive     * >=100,000 COLONIES/mL PROTEUS MIRABILIS  Urine culture     Status: Abnormal   Collection Time: 02/14/18  2:21 AM  Result Value Ref Range Status   Specimen Description URINE, RANDOM  Final   Special Requests   Final    NONE Performed at [facility], [place] [address] [place], [place] [address]    Culture 40,000 COLONIES/mL PROTEUS MIRABILIS (A)  Final   Report Status 02/17/2018 FINAL  Final   Organism ID, Bacteria PROTEUS MIRABILIS (A)  Final      Susceptibility   Proteus mirabilis - MIC*    AMPICILLIN >=32 RESISTANT Resistant     CEFAZOLIN >=64 RESISTANT Resistant     CEFTRIAXONE RESISTANT Resistant     CIPROFLOXACIN <=0.25 SENSITIVE Sensitive     GENTAMICIN <=1 SENSITIVE Sensitive     IMIPENEM 2 SENSITIVE Sensitive      NITROFURANTOIN 128 RESISTANT Resistant     TRIMETH/SULFA <=20 SENSITIVE Sensitive     AMPICILLIN/SULBACTAM >=32 RESISTANT Resistant     PIP/TAZO <=4 SENSITIVE Sensitive     * 40,000 COLONIES/mL PROTEUS MIRABILIS  Culture, Urine     Status: None   Collection Time: 02/19/18  5:21 AM  Result Value Ref Range Status   Specimen Description URINE, RANDOM  Final   Special Requests NONE  Final   Culture   Final    NO GROWTH Performed at [facility], [place] [address], [place], [place] [address]    Report Status 02/20/2018 FINAL  Final      Radiology Studies: No results found.   Scheduled Meds: .  dexamethasone  4 mg Intravenous Q6H  . flecainide  100 mg Oral Q12H  . insulin aspart  0-9 Units Subcutaneous TID WC  . levETIRAcetam  500 mg Oral BID  . [START ON 02/22/2018] levothyroxine  100 mcg Oral Q0600   Continuous Infusions: . sodium chloride       LOS: 1 day   Time Spent in minutes  30 minutes  Taimi Towe D.O. on 02/21/2018 at 2:14 PM  Between 7am to 7pm - Please see pager noted on [email or web address]  After 7pm go to [email or web address]  And look for the night coverage person covering for me after hours  [facility] Office  [phone]

## 2018-02-21 NOTE — Progress Notes (Signed)
CSW following for discharge plan. CSW never heard back from [facility] about referral, and authorization for SNF has not been obtained from [employer]   CSW to continue to follow.  Laveda Abbe,  Clinical Social Worker [phone]

## 2018-02-21 NOTE — Progress Notes (Signed)
  Speech Language Pathology Treatment: Dysphagia  Patient Details Name: DEQUAVIOUS HARSHBERGER MRN: [id number] DOB: [birth year] Today's Date: 02/21/2018 Time: 1540-0867 SLP Time Calculation (min) (ACUTE ONLY): 28 min  Assessment / Plan / Recommendation Clinical Impression  Skilled treatment with min verbal cues provided for increased posterior oral propulsion and efficient mastication d/t cognitive impairment/oral holding intermittently; pt with decreased sustained attention/overall processing delay in response to questions re: personal information and yes/no questions (90% accuracy); pt given soft solid with impaired mastication (slow but thorough) and oral holding noted; recommend upgrade to Dysphagia 2 (chopped)/thin with general swallowing precautions and decreased environmental stimuli during PO intake; ST will continue to f/u for diet tolerance/cognitive tx while in house.  HPI HPI: 77 y.o. male with medical history significant of melanoma with brain mets, subdural hematoma, A. fib, glucose intolerance, hypertension, hyperlipidemia, hypothyroidism presenting to the hospital for evaluation of generalized weakness.  Upon EMS arrival, noted to have a focal seizure that lasted approximately 4 minutes.  Patient is currently on Decadron and receiving radiation therapy for brain mets.  He was recently admitted from February 13, 2018 to February 16, 2018 for acute encephalopathy thought to be secondary to underlying brain mets and possible UTI      SLP Plan  Continue with current plan of care       Recommendations  Diet recommendations: Dysphagia 2 (fine chop);Thin liquid Liquids provided via: Cup;Straw Medication Administration: Whole meds with puree Supervision: Patient able to self feed;Staff to assist with self feeding;Intermittent supervision to cue for compensatory strategies Compensations: Minimize environmental distractions;Slow rate;Small sips/bites Postural Changes and/or Swallow Maneuvers:  Seated upright 90 degrees                Oral Care Recommendations: Oral care BID Follow up Recommendations: Skilled Nursing facility;24 hour supervision/assistance SLP Visit Diagnosis: Dysphagia, unspecified (R13.10) Plan: Continue with current plan of care                      Elvina Sidle, M.S., CCC-SLP 02/21/2018, 9:36 AM

## 2018-02-21 NOTE — Progress Notes (Signed)
  Radiation Oncology         [phone] ________________________________  Inpatient  Brain metastases ([place])    ICD-10-CM   1. Brain metastases [facility]) C79.31      Stereotactic Treatment Procedure Note  Name: [name] [name] MRN: [id number]  Date: 02/20/2018  DOB: 1940-10-08  SPECIAL TREATMENT PROCEDURE  3D TREATMENT PLANNING AND DOSIMETRY:  The patient's radiation plan was reviewed and approved by neurosurgery and radiation oncology prior to treatment.  It showed 3-dimensional radiation distributions overlaid onto the planning CT/MRI image set.  The [facility] for the target structures as well as the organs at risk were reviewed. The documentation of the 3D plan and dosimetry are filed in the radiation oncology EMR.  NARRATIVE:  [name] [name] was brought to the TrueBeam stereotactic radiation treatment machine and placed supine on the CT couch. The head frame was applied, and the patient was set up for stereotactic radiosurgery.  Neurosurgery was present for the set-up and delivery  SIMULATION VERIFICATION:  In the couch zero-angle position, the patient underwent Exactrac imaging using the Brainlab system with orthogonal KV images.  These were carefully aligned and repeated to confirm treatment position for each of the isocenters.  The Exactrac snap film verification was repeated at each couch angle.  SPECIAL TREATMENT PROCEDURE: [name] received stereotactic radiosurgery to the following target with 6FFF photons, using 6 vmat beams Max dose=117.1%:  PTV1 Rt Front 64mm to a dose of 9Gy (3rd of 3 fractions)   This constitutes a special treatment procedure due to the ablative dose delivered and the technical nature of treatment.  This highly technical modality of treatment ensures that the ablative dose is centered on the patient's tumor while sparing normal tissues from excessive dose and risk of detrimental effects.  STEREOTACTIC TREATMENT MANAGEMENT:  Following delivery, the patient  was transported to nursing in stable condition and monitored for possible acute effects.   The patient tolerated treatment without significant acute effects, and was brought back to the inpatient unit in stable condition.  PLAN: Follow-up in in 2 weeks. ________________________________   Eppie Gibson, MD

## 2018-02-21 NOTE — Evaluation (Signed)
Occupational Therapy Evaluation Patient Details Name: [name] [name] MRN: [id number] DOB: [birth year] Today's Date: 02/21/2018    History of Present Illness Pt is a 77 y.o. male admitted 01/26/2018 with generalized weakness and confusion, found to be slumped over with LOC requiring EMS who suspected seizure. Of note, recent admission 02/13/18-02/16/18 for acute encephalopathy thought to be secondary to underlying brain mets and possible UTI. 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   Clinical Impression   PTA Pt independent in ADL and mobility. Pt is currently total A for all aspects of ADL and bed mobility - PT/OT used maximove to lift Pt from bed to chair without change in arousal with positional change. Pt was very lethargic throughout session - remained with eyes closed for most of the session. He would rarely respond to questions or follow commands. Wife present throughout session. AT this time recommending Palliative Consult as well as SNF for follow up post-acute. Next session  To focus on direction following for ADL and sitting balance should arousal level allow.     Follow Up Recommendations  SNF    Equipment Recommendations    None   Recommendations for Other Services  Palliative Medicine     Precautions / Restrictions Precautions Precautions: Fall Precaution Comments: L eye blindness Restrictions Weight Bearing Restrictions: No      Mobility Bed Mobility Overal bed mobility: Needs Assistance Bed Mobility: Rolling Rolling: Total assist;+2 for physical assistance         General bed mobility comments: total A for all aspects of bed mobility  Transfers Overall transfer level: Needs assistance               General transfer comment: total A +2 for all aspects - lifted to chair from bed    Balance                                            ADL either performed or assessed with clinical judgement   ADL Overall ADL's : Needs assistance/impaired     Grooming: Maximal assistance;Wash/dry face;Bed level Grooming Details (indicate cue type and reason): one time did bring his hand up and wipe the right side of his face, but after that would not do anymore- even when a washcloth was placed on his face he would not take it off                               General ADL Comments: total A in all aspects of ADL for true task completion     Vision Baseline Vision/History: Cataracts;Wears glasses(blind LS) Wears Glasses: Reading only Patient Visual Report: Other (comment)(did not open eyes long enough for visual assessment ) Additional Comments: vision information gleaned from previous session - Pt could not open eyes long enough for visual assessment     Perception     Praxis      Pertinent Vitals/Pain Pain Assessment: Faces Faces Pain Scale: Hurts a little bit Pain Location: generalized Pain Descriptors / Indicators: Discomfort Pain Intervention(s): Monitored during session;Repositioned     Hand Dominance Right   Extremity/Trunk Assessment Upper Extremity Assessment Upper Extremity Assessment: RUE deficits/detail;LUE deficits/detail RUE Deficits / Details: did not  perform FF past 30 degrees today, very limited participation and arousal for evaluation grasp 3+/5 RUE Coordination: decreased fine motor;decreased gross motor LUE Deficits / Details: did not perform FF past 30 degrees today, very limited participation and arousal for evaluation; grasp was 3+/5 LUE Coordination: decreased fine motor;decreased gross motor   Lower Extremity Assessment Lower Extremity Assessment: Defer to PT evaluation   Cervical / Trunk Assessment Cervical / Trunk Assessment: Normal   Communication Communication Communication: Expressive difficulties   Cognition Arousal/Alertness:  Lethargic Behavior During Therapy: Flat affect Overall Cognitive Status: Impaired/Different from baseline Area of Impairment: Orientation;Attention;Following commands;Safety/judgement;Awareness;Problem solving                 Orientation Level: Disoriented to;Time;Situation Current Attention Level: Focused   Following Commands: Follows one step commands inconsistently;Follows one step commands with increased time(very randomly, 1/20 requests) Safety/Judgement: Decreased awareness of deficits;Decreased awareness of safety Awareness: Intellectual Problem Solving: Slow processing;Decreased initiation;Difficulty sequencing;Requires verbal cues;Requires tactile cues General Comments: Primarily non-responsive - did respond to yeling his name, and sternal rub; Pt would respond to questions/follow commands 1/20 times. He largely kept his eyes closed throughout the session. Was able to state his name is "Johnryan" that he was at [facility]" and he told us at least twice "I can't" when asked to complete a task   General Comments  wife present throughout session "This is not how my husband is usually...he cuts up and laughs and jokes"    Exercises     Shoulder Instructions      Home Living Family/patient expects to be discharged to:: Skilled nursing facility Living Arrangements: Spouse/significant other;Other relatives Available Help at Discharge: Family;Available 24 hours/day Type of Home: House Home Access: Ramped entrance     Home Layout: One level     Bathroom Shower/Tub: Occupational psychologist: Handicapped height     Home Equipment: Environmental consultant - 2 wheels;Cane - single point;Bedside commode;Shower seat   Additional Comments: Info taken from PT evaluation on 02/16/18  Lives With: Spouse    Prior Functioning/Environment Level of Independence: Independent        Comments: gleaned from Pt evaluation on 12/25        OT Problem List: Decreased strength;Decreased  activity tolerance;Decreased cognition;Obesity;Impaired UE functional use;Decreased range of motion;Impaired balance (sitting and/or standing);Decreased coordination;Decreased safety awareness;Decreased knowledge of use of DME or AE      OT Treatment/Interventions: Self-care/ADL training;Therapeutic exercise;DME and/or AE instruction;Therapeutic activities;Cognitive remediation/compensation;Patient/family education;Balance training    OT Goals(Current goals can be found in the care plan section) Acute Rehab OT Goals Patient Stated Goal: None stated  OT Frequency: Min 2X/week   Barriers to D/C:            Co-evaluation              AM-PAC OT "6 Clicks" Daily Activity     Outcome Measure Help from another person eating meals?: Total Help from another person taking care of personal grooming?: Total Help from another person toileting, which includes using toliet, bedpan, or urinal?: Total Help from another person bathing (including washing, rinsing, drying)?: Total Help from another person to put on and taking off regular upper body clothing?: Total Help from another person to put on and taking off regular lower body clothing?: Total 6 Click Score: 6   End of Session Nurse Communication: Need for lift equipment  Activity Tolerance: Patient limited by lethargy Patient left: in chair;with call bell/phone within reach;with chair alarm set;with family/visitor present  OT  Visit Diagnosis: Muscle weakness (generalized) (M62.81);Unsteadiness on feet (R26.81);Other abnormalities of gait and mobility (R26.89);Other symptoms and signs involving cognitive function;Cognitive communication deficit (R41.841);Other symptoms and signs involving the nervous system (R29.898) Symptoms and signs involving cognitive functions: Nontraumatic intracerebral hemorrhage;Other cerebrovascular disease                Time: 1050-1130 OT Time Calculation (min): 40 min Charges:  OT General Charges $OT Visit: 1  Visit OT Evaluation $OT Eval Moderate Complexity: Girard OTR/L Acute Rehabilitation Services Pager: [phone] Office: [place] 02/21/2018, 12:07 PM

## 2018-02-21 NOTE — Progress Notes (Signed)
Physical Therapy Treatment Patient Details Name: [name] [name] MRN: [id number] DOB: 07/25/1940 Today's Date: 02/21/2018    History of Present Illness Pt is a 77 y.o. male admitted 02/04/2018 with generalized weakness and confusion, found to be slumped over with LOC requiring EMS who suspected seizure. Of note, recent admission 02/13/18-02/16/18 for acute encephalopathy thought to be secondary to underlying brain mets and possible UTI. 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    PT Comments    Pt performed bouts of rolling in and out of alertness would repsond when name called repeatedly.  Not safe to mobilize in current state.  Maxi move lift equipment used to position patient out of bed for benefit of sitting upright.  Pt able to report his first name and that he was in [place].  Pt able to grab items from therapist but would quickly lose focus and began to close his eyes.  Plan for SNF remains appropriate.    Follow Up Recommendations  SNF;Supervision/Assistance - 24 hour     Equipment Recommendations  (hoyer lift)    Recommendations for Other Services       Precautions / Restrictions Precautions Precautions: Fall Precaution Comments: L eye blindness Restrictions Weight Bearing Restrictions: No    Mobility  Bed Mobility Overal bed mobility: Needs Assistance Bed Mobility: Rolling Rolling: Total assist;+2 for physical assistance         General bed mobility comments: total A for all aspects of bed mobility  Transfers Overall transfer level: Needs assistance               General transfer comment: total A +2 for all aspects - lifted to chair from bed  Ambulation/Gait                 Stairs             Wheelchair Mobility    Modified Rankin (Stroke Patients Only)       Balance                                             Cognition Arousal/Alertness: Lethargic Behavior During Therapy: Flat affect Overall Cognitive Status: Difficult to assess Area of Impairment: Orientation;Attention;Following commands;Safety/judgement;Awareness;Problem solving                 Orientation Level: Disoriented to;Time;Situation Current Attention Level: Focused   Following Commands: Follows one step commands inconsistently;Follows one step commands with increased time(very randomly, 1/20 requests) Safety/Judgement: Decreased awareness of deficits;Decreased awareness of safety Awareness: Intellectual Problem Solving: Slow processing;Decreased initiation;Difficulty sequencing;Requires verbal cues;Requires tactile cues General Comments: Primarily non-responsive - did respond to yeling his name, and sternal rub; Pt would respond to questions/follow commands 1/20 times. He largely kept his eyes closed throughout the session. Was able to state his name is "Johanthan" that he was at [facility]" and he told us at least twice "I can't" when asked to complete a task      Exercises      General Comments General comments (skin integrity, edema, etc.): wife present throughout session "This is not how my husband is usually...he cuts up and laughs and jokes"      Pertinent Vitals/Pain Pain Assessment: Faces Pain Score: 0-No pain Faces Pain  Scale: Hurts a little bit Pain Location: generalized Pain Descriptors / Indicators: Discomfort Pain Intervention(s): Monitored during session;Repositioned    Home Living Family/patient expects to be discharged to:: Skilled nursing facility Living Arrangements: Spouse/significant other;Other relatives Available Help at Discharge: Family;Available 24 hours/day Type of Home: House Home Access: Ramped entrance   Home Layout: One level Home Equipment: Walker - 2 wheels;Cane - single point;Bedside commode;Shower seat Additional Comments: Info  taken from PT evaluation on 02/16/18    Prior Function Level of Independence: Independent      Comments: gleaned from Pt evaluation on 12/25   PT Goals (current goals can now be found in the care plan section) Acute Rehab PT Goals Patient Stated Goal: None stated Potential to Achieve Goals: Poor Progress towards PT goals: Progressing toward goals    Frequency    Min 3X/week      PT Plan Current plan remains appropriate    Co-evaluation PT/OT/SLP Co-Evaluation/Treatment: Yes Reason for Co-Treatment: Complexity of the patient's impairments (multi-system involvement);For patient/therapist safety;Necessary to address cognition/behavior during functional activity;To address functional/ADL transfers PT goals addressed during session: Mobility/safety with mobility OT goals addressed during session: ADL's and self-care      AM-PAC PT "6 Clicks" Mobility   Outcome Measure  Help needed turning from your back to your side while in a flat bed without using bedrails?: Total Help needed moving from lying on your back to sitting on the side of a flat bed without using bedrails?: Total Help needed moving to and from a bed to a chair (including a wheelchair)?: Total Help needed standing up from a chair using your arms (e.g., wheelchair or bedside chair)?: Total Help needed to walk in hospital room?: Total Help needed climbing 3-5 steps with a railing? : Total 6 Click Score: 6    End of Session   Activity Tolerance: Patient limited by lethargy Patient left: in chair;with chair alarm set;with family/visitor present Nurse Communication: Mobility status;Need for lift equipment PT Visit Diagnosis: Unsteadiness on feet (R26.81);Muscle weakness (generalized) (M62.81);Other symptoms and signs involving the nervous system (R29.898)     Time: 6734-1937 PT Time Calculation (min) (ACUTE ONLY): 38 min  Charges:  $Therapeutic Activity: 8-22 mins                     Governor Rooks, PTA Acute  Rehabilitation Services Pager [phone] Office [phone]     Geonna Lockyer Eli Hose 02/21/2018, 12:55 PM

## 2018-02-22 LAB — GLUCOSE, CAPILLARY
GLUCOSE-CAPILLARY: 161 mg/dL — AB (ref 70–99)
Glucose-Capillary: 200 mg/dL — ABNORMAL HIGH (ref 70–99)

## 2018-02-22 MED ORDER — HYDRALAZINE HCL 20 MG/ML IJ SOLN: 5.0000 mg | INTRAMUSCULAR | Status: DC | PRN

## 2018-02-22 NOTE — Progress Notes (Signed)
PROGRESS NOTE    [name] [name]  VFI:[id number] DOB: 12-29-1940 DOA: 02/23/2018 PCP: Dettinger, [name], MD   Brief Narrative:  HPI on 12/24/219 by Dr. Shela Leff [name] [name] is a 77 y.o. male with medical history significant of melanoma with brain mets, subdural hematoma, A. fib, glucose intolerance, hypertension, hyperlipidemia, hypothyroidism presenting to the hospital for evaluation of generalized weakness.  Upon EMS arrival, noted to have a focal seizure that lasted approximately 4 minutes.  Patient is currently on Decadron and receiving radiation therapy for brain mets.  He was recently admitted from February 13, 2018 to February 16, 2018 for acute encephalopathy thought to be secondary to underlying brain mets and possible UTI.  Urine culture was positive for Proteus and patient was discharged with ciprofloxacin.  Patient was somnolent, as such, history was provided by wife at bedside.  Wife states that the family was trying to get to the patient to the bathroom today but even before they could get him up from the chair they noticed that he slumped backwards in the chair and lost consciousness for a few minutes.  They believe patient has been confused for the past few days.  When EMS arrived they noticed that the patient's body was stiff and they thought he was having a seizure.  Family denies any prior history of seizures.  They are not sure if the patient was incontinent at that time.  He does take Decadron daily.  Wife states that since his recent hospital discharge patient has been very lethargic and irritable.  He never went back to his baseline.  He has not been having any fevers.  He has been receiving ciprofloxacin at home and is at the end of the UTI treatment course.   Interim history Admitted with seizure, altered mental status. PT recommending SNF- insurance auth pending.  Assessment & Plan   Melanoma with brain metastasis, intraventricular hemorrhage, subarachnoid  hemorrhage, seizure -CT head showed scattered intracranial metastatic lesions with increased amount of intraventricular hemorrhage in the occipital horns of the lateral ventricles compared to CT done February 13, 2018.  Also showing mild but increased bilateral subarachnoid hemorrhage along the parietal lobes bilaterally. -EDP discussed with Dr. Burr Medico from oncology, recommended increasing dose of Decadron. -Admitting physician discussed with Dr. Trenton Gammon, neurosurgery, who recommended giving Decadron 4 mg every 6 hours.  Feels CT findings are stable, no urgent surgical need and neurosurgery can be consulted if needed. -Continue Keppra 500 mg- will transition to oral -Patient completed radiation therapy on 02/20/2018 -Called and left message for Dr. Irene Limbo- no call back. Will attempt again today.   Leukocytosis -Currently afebrile -Patient was recently treated for UTI (Proteus 02/14/2018) and is now at the end of his treatment course -Suspect leukocytosis secondary to steroid use -WBC trending downward -Repeat urine culture showed no growth -Continue to monitor CBC  Mild Hyponatremia -Resolved -sodium 133- currently up to 139 -continue IVF  -Continue BMP  Elevated LFTs -MRI abdomen, January 26, 2018, showed mild diffuse hepatic steatosis.  Normal gallbladder with no cholelithiasis.  No biliary ductal dilatation or choledocholithiasis.  Common bile duct diameter 3 mm. -Trending downward -Continue to monitor   Paroxsymal atrial fibrillation -Currently in sinus rhythm -currently not anticoagulation due to history of intracranial hemorrhage and subarachnoid hematoma -Continue flecainide  Hyperglycemia -Hemoglobin 6.27 October 2017 -Suspect secondary to steroids -Continue sliding scale and CBG monitoring  Essential hypertension -BP currently stable, continue hydralazine as needed -Patient is not on any home medications for hypertension  Hypothyroidism -Continue Synthroid  Reactive  airway disease -Continue albuterol as needed.  Currently stable, no wheezing on exam  Physical deconditioning/generalized weakness -Suspect secondary to metastatic cancer -PT consulted- recommended SNF  Dysphagia -Speech therapy consulted for evaluation, transition to dysphasia 2 diet  DVT Prophylaxis  SCDs  Code Status: Full  Family Communication: Wife at bedside  Disposition Plan: Admitted.  Pending SNF placement, insurance authorization  Consultants Oncology, Dr. Burr Medico, by EDP (via phone) Neurosurgery, Dr. Trenton Gammon, by Dr. Marlowe Sax (via phone) Radiation oncology  Procedures  None  Antibiotics   Anti-infectives (From admission, onward)   None      Subjective:   [name] [name] seen and examined today.  Patient awake but continues to be altered today.  No complaints this morning.   Objective:   Vitals:   02/22/18 0008 02/22/18 0457 02/22/18 0733 02/22/18 1125  BP: (!) 183/109 (!) 155/102 (!) 143/116 101/66  Pulse: 100 (!) 113 67 93  Resp: 20 20  20   Temp: 98.1 F (36.7 C) 98 F (36.7 C) 98.3 F (36.8 C) 98.1 F (36.7 C)  TempSrc: Oral Oral Oral   SpO2: 92% 95% 95% (!) 87%  Weight:      Height:        Intake/Output Summary (Last 24 hours) at 02/22/2018 1222 Last data filed at 02/22/2018 0900 Gross per 24 hour  Intake 240 ml  Output -  Net 240 ml   Filed Weights   02/03/2018 1432 01/29/2018 2207  Weight: 117.8 kg 121.9 kg   Exam  General: Well developed, chronically ill-appearing, NAD  HEENT: NCAT ,mucous membranes moist.   Cardiovascular: S1 S2 auscultated, RRR  Respiratory: Clear to auscultation bilaterally with equal chest rise  Abdomen: Soft, obese, nontender, nondistended, + bowel sounds  Extremities: warm dry without cyanosis clubbing  Neuro: AAOx2 (self, place), otherwise nonfocal  Data Reviewed: I have personally reviewed following labs and imaging studies  CBC: Recent Labs  Lab 01/27/2018 1513 02/20/18 0426  WBC 15.6* 13.1*  NEUTROABS  12.8*  --   HGB 16.7 15.0  HCT 50.2 45.1  MCV 86.0 86.6  PLT 217 856   Basic Metabolic Panel: Recent Labs  Lab 02/16/18 0547 02/12/2018 1513 02/20/18 0426  NA 136 132* 137  K 3.9 3.6 4.6  CL 102 98 102  CO2 20* 21* 23  GLUCOSE 111* 145* 122*  BUN 22 20 24*  CREATININE 0.98 1.11 1.00  CALCIUM 8.4* 9.3 9.0   GFR: Estimated Creatinine Clearance: 82.3 mL/min (by C-G formula based on SCr of 1 mg/dL). Liver Function Tests: Recent Labs  Lab 01/26/2018 1516 02/20/18 0426  AST 30 24  ALT 62* 47*  ALKPHOS 53 43  BILITOT 1.4* 1.3*  PROT 7.4 6.1*  ALBUMIN 3.8 3.2*   No results for input(s): LIPASE, AMYLASE in the last 168 hours. No results for input(s): AMMONIA in the last 168 hours. Coagulation Profile: No results for input(s): INR, PROTIME in the last 168 hours. Cardiac Enzymes: No results for input(s): CKTOTAL, CKMB, CKMBINDEX, TROPONINI in the last 168 hours. BNP (last 3 results) No results for input(s): PROBNP in the last 8760 hours. HbA1C: No results for input(s): HGBA1C in the last 72 hours. CBG: Recent Labs  Lab 02/20/18 2140 02/21/18 0621 02/21/18 1137 02/21/18 1629 02/21/18 2155  GLUCAP 131* 138* 139* 125* 144*   Lipid Profile: No results for input(s): CHOL, HDL, LDLCALC, TRIG, CHOLHDL, LDLDIRECT in the last 72 hours. Thyroid Function Tests: No results for input(s): TSH, T4TOTAL, FREET4, T3FREE,  THYROIDAB in the last 72 hours. Anemia Panel: No results for input(s): VITAMINB12, FOLATE, FERRITIN, TIBC, IRON, RETICCTPCT in the last 72 hours. Urine analysis:    Component Value Date/Time   COLORURINE YELLOW 02/14/2018 0221   APPEARANCEUR TURBID (A) 02/14/2018 0221   LABSPEC <1.005 (L) 02/14/2018 0221   PHURINE >9.0 (H) 02/14/2018 0221   GLUCOSEU NEGATIVE 02/14/2018 0221   HGBUR TRACE (A) 02/14/2018 0221   BILIRUBINUR SMALL (A) 02/14/2018 0221   KETONESUR NEGATIVE 02/14/2018 0221   PROTEINUR >300 (A) 02/14/2018 0221   UROBILINOGEN 1.0 05/11/2010 1145    NITRITE POSITIVE (A) 02/14/2018 0221   LEUKOCYTESUR LARGE (A) 02/14/2018 0221   Sepsis Labs: @LABRCNTIP (procalcitonin:4,lacticidven:4)  ) Recent Results (from the past 240 hour(s))  Culture, Urine     Status: Abnormal   Collection Time: 02/12/18  1:55 PM  Result Value Ref Range Status   Specimen Description   Final    URINE, CLEAN CATCH Performed at [facility], [place] [address] [place], [place] [address]    Special Requests   Final    NONE Performed at [facility], [place] [address] [place], [place] [address]    Culture >=100,000 COLONIES/mL PROTEUS MIRABILIS (A)  Final   Report Status 02/14/2018 FINAL  Final   Organism ID, Bacteria PROTEUS MIRABILIS (A)  Final      Susceptibility   Proteus mirabilis - MIC*    AMPICILLIN >=32 RESISTANT Resistant     CEFAZOLIN >=64 RESISTANT Resistant     CEFTRIAXONE RESISTANT Resistant     CIPROFLOXACIN <=0.25 SENSITIVE Sensitive     GENTAMICIN <=1 SENSITIVE Sensitive     IMIPENEM 4 SENSITIVE Sensitive     NITROFURANTOIN 128 RESISTANT Resistant     TRIMETH/SULFA <=20 SENSITIVE Sensitive     AMPICILLIN/SULBACTAM >=32 RESISTANT Resistant     PIP/TAZO <=4 SENSITIVE Sensitive     * >=100,000 COLONIES/mL PROTEUS MIRABILIS  Urine culture     Status: Abnormal   Collection Time: 02/14/18  2:21 AM  Result Value Ref Range Status   Specimen Description URINE, RANDOM  Final   Special Requests   Final    NONE Performed at [facility], [place] [address] [place], [place] [address]    Culture 40,000 COLONIES/mL PROTEUS MIRABILIS (A)  Final   Report Status 02/17/2018 FINAL  Final   Organism ID, Bacteria PROTEUS MIRABILIS (A)  Final      Susceptibility   Proteus mirabilis - MIC*    AMPICILLIN >=32 RESISTANT Resistant     CEFAZOLIN >=64 RESISTANT Resistant     CEFTRIAXONE RESISTANT Resistant     CIPROFLOXACIN <=0.25 SENSITIVE Sensitive     GENTAMICIN <=1 SENSITIVE Sensitive     IMIPENEM 2  SENSITIVE Sensitive     NITROFURANTOIN 128 RESISTANT Resistant     TRIMETH/SULFA <=20 SENSITIVE Sensitive     AMPICILLIN/SULBACTAM >=32 RESISTANT Resistant     PIP/TAZO <=4 SENSITIVE Sensitive     * 40,000 COLONIES/mL PROTEUS MIRABILIS  Culture, Urine     Status: None   Collection Time: 02/19/18  5:21 AM  Result Value Ref Range Status   Specimen Description URINE, RANDOM  Final   Special Requests NONE  Final   Culture   Final    NO GROWTH Performed at [facility], [place] [address] [place], [place] [address]    Report Status 02/20/2018 FINAL  Final      Radiology Studies: No results found.   Scheduled Meds: .  dexamethasone  4 mg Intravenous Q6H  . flecainide  100 mg Oral Q12H  . insulin aspart  0-9 Units Subcutaneous TID WC  . levETIRAcetam  500 mg Oral BID  . levothyroxine  100 mcg Oral Q0600   Continuous Infusions: . sodium chloride       LOS: 2 days   Time Spent in minutes  30 minutes  Yoltzin Ransom D.O. on 02/22/2018 at 12:22 PM  Between 7am to 7pm - Please see pager noted on [email or web address]  After 7pm go to [email or web address]  And look for the night coverage person covering for me after hours  [facility] Office  [phone]

## 2018-02-23 ENCOUNTER — Inpatient Hospital Stay (HOSPITAL_COMMUNITY): Payer: PPO

## 2018-02-23 MED ORDER — LEVETIRACETAM IN NACL 500 MG/100ML IV SOLN
500.0000 mg | Freq: Two times a day (BID) | INTRAVENOUS | Status: DC
  Administered 2018-02-23: 500 mg via INTRAVENOUS
  Filled 2018-02-23: qty 100

## 2018-02-23 MED ORDER — SODIUM CHLORIDE 0.9 % IV SOLN
INTRAVENOUS | Status: DC
  Administered 2018-02-23: 11:00:00 via INTRAVENOUS

## 2018-02-23 NOTE — Progress Notes (Signed)
Patient stay somnolent most of the day. VSS. Family updated.  POC: Continue to Q2turn for stimulation.  Continue to monitor VSS

## 2018-02-23 NOTE — Progress Notes (Signed)
Patient appears much more somnolent my last shift yesterday. Wife at bedside wanting to feed pt breakfast. RN rec'd to hold at this time d/t his decrease LOC. Wife voices that she may need to speak with someone regarding to pt's condition and take him home with "hospice". RN listened and provide comfort with wife.   Ave Filter, RN

## 2018-02-23 NOTE — Progress Notes (Signed)
Called to the bedside by RN with concerns of AMS. Pt has been unresponsive for two days according to wife at bedside. On assessment, pt is unresponsive to verbal and physical stimuli. Discussed pt prognosis with pt wife and she has agreed to make pt DNR for now. Consult previously placed for palliative care. Continue current treatment plan  Lovey Newcomer, NP [facility] 7p-7a [phone]

## 2018-02-23 NOTE — Progress Notes (Signed)
Patient continue to be somnolent throughout shift. Awake at time with turn. Spouse at bedside. No intake today. Q2H turn, oral care and ADLs provided in bed. IVF restart. Mucosa area noted more moist today.   POC: Continue Q2 hr turn for to help with awakefulness. Continue IVF.   Ave Filter, RN

## 2018-02-23 NOTE — Progress Notes (Signed)
PROGRESS NOTE    [name] [name]  KDT:[id number] DOB: [birth year] DOA: 02/19/2018 PCP: Dettinger, [name], MD   Brief Narrative:  HPI on 12/24/219 by Dr. Shela Leff [name] [name] is a 77 y.o. male with medical history significant of melanoma with brain mets, subdural hematoma, A. fib, glucose intolerance, hypertension, hyperlipidemia, hypothyroidism presenting to the hospital for evaluation of generalized weakness.  Upon EMS arrival, noted to have a focal seizure that lasted approximately 4 minutes.  Patient is currently on Decadron and receiving radiation therapy for brain mets.  He was recently admitted from February 13, 2018 to February 16, 2018 for acute encephalopathy thought to be secondary to underlying brain mets and possible UTI.  Urine culture was positive for Proteus and patient was discharged with ciprofloxacin.  Patient was somnolent, as such, history was provided by wife at bedside.  Wife states that the family was trying to get to the patient to the bathroom today but even before they could get him up from the chair they noticed that he slumped backwards in the chair and lost consciousness for a few minutes.  They believe patient has been confused for the past few days.  When EMS arrived they noticed that the patient's body was stiff and they thought he was having a seizure.  Family denies any prior history of seizures.  They are not sure if the patient was incontinent at that time.  He does take Decadron daily.  Wife states that since his recent hospital discharge patient has been very lethargic and irritable.  He never went back to his baseline.  He has not been having any fevers.  He has been receiving ciprofloxacin at home and is at the end of the UTI treatment course.   Interim history Admitted with seizure, altered mental status.  Continues to have altered mental status but however more somnolent today.  Will obtain repeat CT head.  Will consult palliative care.  PT  recommending SNF- insurance auth pending.  Assessment & Plan   Melanoma with brain metastasis, intraventricular hemorrhage, subarachnoid hemorrhage, seizure -CT head showed scattered intracranial metastatic lesions with increased amount of intraventricular hemorrhage in the occipital horns of the lateral ventricles compared to CT done February 13, 2018.  Also showing mild but increased bilateral subarachnoid hemorrhage along the parietal lobes bilaterally. -EDP discussed with Dr. Burr Medico from oncology, recommended increasing dose of Decadron. -Admitting physician discussed with Dr. Trenton Gammon, neurosurgery, who recommended giving Decadron 4 mg every 6 hours.  Feels CT findings are stable, no urgent surgical need and neurosurgery can be consulted if needed. -Continue Keppra 500 mg- will transition to oral -Patient completed radiation therapy on 02/20/2018 -Called and left message for Dr. Irene Limbo- no call back.  Will attempt to call Dr. Claiborne Billings again today.  If no call back will again contact him on 2018-03-23. -Given increased altered mental status and lethargy today, will obtain repeat CT head. -Will also consult palliative care to discuss goals of care.  Leukocytosis -Currently afebrile -Patient was recently treated for UTI (Proteus 02/14/2018) and is now at the end of his treatment course -Suspect leukocytosis secondary to steroid use -WBC trending downward -Repeat urine culture showed no growth -Continue to monitor CBC  Mild Hyponatremia -Resolved -sodium 133- currently up to 137 -continue IVF  -Continue BMP  Elevated LFTs -MRI abdomen, January 26, 2018, showed mild diffuse hepatic steatosis.  Normal gallbladder with no cholelithiasis.  No biliary ductal dilatation or choledocholithiasis.  Common bile duct diameter 3 mm. -Trending  downward -Continue to monitor   Paroxsymal atrial fibrillation -Currently in sinus rhythm -currently not anticoagulation due to history of intracranial hemorrhage  and subarachnoid hematoma -Continue flecainide  Hyperglycemia -Hemoglobin 6.27 October 2017 -Suspect secondary to steroids -Continue sliding scale and CBG monitoring  Essential hypertension -BP currently stable, continue hydralazine as needed -Patient is not on any home medications for hypertension  Hypothyroidism -Continue Synthroid  Reactive airway disease -Continue albuterol as needed.  Currently stable, no wheezing on exam  Physical deconditioning/generalized weakness -Suspect secondary to metastatic cancer -PT consulted- recommended SNF  Dysphagia -Speech therapy consulted for evaluation, transition to dysphasia 2 diet  DVT Prophylaxis  SCDs  Code Status: Full  Family Communication: Wife at bedside  Disposition Plan: Admitted.  Pending SNF placement, insurance authorization.  We will repeat CT head today, and consult palliative care.  Consultants Oncology, Dr. Burr Medico, by EDP (via phone) Neurosurgery, Dr. Trenton Gammon, by Dr. Marlowe Sax (via phone) Radiation oncology Palliative care  Procedures  None  Antibiotics   Anti-infectives (From admission, onward)   None      Subjective:   [name] [name] seen and examined today.  Patient more somnolent today.  Not waking up for me this morning although moving all extremities. Objective:   Vitals:   02/22/18 2331 02/23/18 0127 02/23/18 0311 02/23/18 0752  BP: 101/68 122/68 120/75 128/87  Pulse: 66 (!) 101 (!) 109 (!) 109  Resp: (!) 21 (!) 28 (!) 24   Temp: 97.7 F (36.5 C)  98.3 F (36.8 C) 97.6 F (36.4 C)  TempSrc: Oral  Axillary Oral  SpO2: 98% 95% 97% 95%  Weight:      Height:        Intake/Output Summary (Last 24 hours) at 02/23/2018 1118 Last data filed at 02/23/2018 0800 Gross per 24 hour  Intake 120 ml  Output -  Net 120 ml   Filed Weights   02/17/2018 1432 02/09/2018 2207  Weight: 117.8 kg 121.9 kg   Exam  General: Well developed, chronically ill appearing, NAD  HEENT: NCAT, mucous membranes moist.     Neck: Supple  Cardiovascular: S1 S2 auscultated, RRR  Respiratory: Clear to auscultation bilaterally with equal chest rise  Abdomen: Soft, obese, nontender, nondistended, + bowel sounds  Extremities: warm dry without cyanosis clubbing or edema  Neuro: somnolent, moving extremities   Data Reviewed: I have personally reviewed following labs and imaging studies  CBC: Recent Labs  Lab 02/09/2018 1513 02/20/18 0426  WBC 15.6* 13.1*  NEUTROABS 12.8*  --   HGB 16.7 15.0  HCT 50.2 45.1  MCV 86.0 86.6  PLT 217 595   Basic Metabolic Panel: Recent Labs  Lab 02/19/2018 1513 02/20/18 0426  NA 132* 137  K 3.6 4.6  CL 98 102  CO2 21* 23  GLUCOSE 145* 122*  BUN 20 24*  CREATININE 1.11 1.00  CALCIUM 9.3 9.0   GFR: Estimated Creatinine Clearance: 82.3 mL/min (by C-G formula based on SCr of 1 mg/dL). Liver Function Tests: Recent Labs  Lab 02/01/2018 1516 02/20/18 0426  AST 30 24  ALT 62* 47*  ALKPHOS 53 43  BILITOT 1.4* 1.3*  PROT 7.4 6.1*  ALBUMIN 3.8 3.2*   No results for input(s): LIPASE, AMYLASE in the last 168 hours. No results for input(s): AMMONIA in the last 168 hours. Coagulation Profile: No results for input(s): INR, PROTIME in the last 168 hours. Cardiac Enzymes: No results for input(s): CKTOTAL, CKMB, CKMBINDEX, TROPONINI in the last 168 hours. BNP (last 3 results) No results  for input(s): PROBNP in the last 8760 hours. HbA1C: No results for input(s): HGBA1C in the last 72 hours. CBG: Recent Labs  Lab 02/21/18 1137 02/21/18 1629 02/21/18 2155 02/22/18 1652 02/22/18 2151  GLUCAP 139* 125* 144* 161* 200*   Lipid Profile: No results for input(s): CHOL, HDL, LDLCALC, TRIG, CHOLHDL, LDLDIRECT in the last 72 hours. Thyroid Function Tests: No results for input(s): TSH, T4TOTAL, FREET4, T3FREE, THYROIDAB in the last 72 hours. Anemia Panel: No results for input(s): VITAMINB12, FOLATE, FERRITIN, TIBC, IRON, RETICCTPCT in the last 72 hours. Urine  analysis:    Component Value Date/Time   COLORURINE YELLOW 02/14/2018 0221   APPEARANCEUR TURBID (A) 02/14/2018 0221   LABSPEC <1.005 (L) 02/14/2018 0221   PHURINE >9.0 (H) 02/14/2018 0221   GLUCOSEU NEGATIVE 02/14/2018 0221   HGBUR TRACE (A) 02/14/2018 0221   BILIRUBINUR SMALL (A) 02/14/2018 0221   KETONESUR NEGATIVE 02/14/2018 0221   PROTEINUR >300 (A) 02/14/2018 0221   UROBILINOGEN 1.0 05/11/2010 1145   NITRITE POSITIVE (A) 02/14/2018 0221   LEUKOCYTESUR LARGE (A) 02/14/2018 0221   Sepsis Labs: @LABRCNTIP (procalcitonin:4,lacticidven:4)  ) Recent Results (from the past 240 hour(s))  Urine culture     Status: Abnormal   Collection Time: 02/14/18  2:21 AM  Result Value Ref Range Status   Specimen Description URINE, RANDOM  Final   Special Requests   Final    NONE Performed at [facility], [place] [address] [place], [place] [address]    Culture 40,000 COLONIES/mL PROTEUS MIRABILIS (A)  Final   Report Status 02/17/2018 FINAL  Final   Organism ID, Bacteria PROTEUS MIRABILIS (A)  Final      Susceptibility   Proteus mirabilis - MIC*    AMPICILLIN >=32 RESISTANT Resistant     CEFAZOLIN >=64 RESISTANT Resistant     CEFTRIAXONE RESISTANT Resistant     CIPROFLOXACIN <=0.25 SENSITIVE Sensitive     GENTAMICIN <=1 SENSITIVE Sensitive     IMIPENEM 2 SENSITIVE Sensitive     NITROFURANTOIN 128 RESISTANT Resistant     TRIMETH/SULFA <=20 SENSITIVE Sensitive     AMPICILLIN/SULBACTAM >=32 RESISTANT Resistant     PIP/TAZO <=4 SENSITIVE Sensitive     * 40,000 COLONIES/mL PROTEUS MIRABILIS  Culture, Urine     Status: None   Collection Time: 02/19/18  5:21 AM  Result Value Ref Range Status   Specimen Description URINE, RANDOM  Final   Special Requests NONE  Final   Culture   Final    NO GROWTH Performed at [facility], [place] [address], [place], [place] [address]    Report Status 02/20/2018 FINAL  Final      Radiology Studies: No results found.   Scheduled Meds: .  dexamethasone  4 mg Intravenous Q6H  . flecainide  100 mg Oral Q12H  . insulin aspart  0-9 Units Subcutaneous TID WC  . levETIRAcetam  500 mg Oral BID  . levothyroxine  100 mcg Oral Q0600   Continuous Infusions: . sodium chloride 50 mL/hr at 02/23/18 1056     LOS: 3 days   Time Spent in minutes  30 minutes  Clytie Shetley D.O. on 02/23/2018 at 11:18 AM  Between 7am to 7pm - Please see pager noted on [email or web address]  After 7pm go to [email or web address]  And look for the night coverage person covering for me after hours  [facility] Office  [phone]

## 2018-02-24 ENCOUNTER — Ambulatory Visit: Payer: PPO | Admitting: Family Medicine

## 2018-02-24 LAB — GLUCOSE, CAPILLARY
GLUCOSE-CAPILLARY: 158 mg/dL — AB (ref 70–99)
GLUCOSE-CAPILLARY: 167 mg/dL — AB (ref 70–99)
Glucose-Capillary: 175 mg/dL — ABNORMAL HIGH (ref 70–99)
Glucose-Capillary: 173 mg/dL — ABNORMAL HIGH (ref 70–99)
Glucose-Capillary: 166 mg/dL — ABNORMAL HIGH (ref 70–99)
Glucose-Capillary: 167 mg/dL — ABNORMAL HIGH (ref 70–99)

## 2018-02-24 MED ORDER — KETOROLAC TROMETHAMINE 30 MG/ML IJ SOLN
15.0000 mg | Freq: Once | INTRAMUSCULAR | Status: AC
  Administered 2018-02-24: 15 mg via INTRAVENOUS
  Filled 2018-02-24: qty 1

## 2018-02-24 MED ORDER — SODIUM CHLORIDE 0.9 % IV BOLUS
500.0000 mL | Freq: Once | INTRAVENOUS | Status: AC
  Administered 2018-02-24: 500 mL via INTRAVENOUS

## 2018-02-26 NOTE — Progress Notes (Signed)
Chaplain responded to call that patient had just died. [employer] of presence and prayers of comfort for family. Tamsen Snider Pager [phone]

## 2018-02-26 NOTE — Progress Notes (Signed)
Pt stopped breathing and has no pulse. Pt is a DNR. Family at bedside. On call notified. TOD 0330.

## 2018-02-26 NOTE — Death Summary Note (Signed)
Death Summary  ADELFO DIEBEL LXB:[id number] DOB: [birth year] DOA: 2018/03/02  PCP: Dettinger, [name], MD  Admit date: March 02, 2018 Date of Death: 08-Mar-2018 Time of Death: 0330  History of present illness:  on 12/24/219 by Dr. Seth Bake Joyceis a 78 y.o.malewith medical history significant ofmelanoma with brain mets,subdural hematoma, A. fib, glucose intolerance, hypertension, hyperlipidemia, hypothyroidism presenting to the hospital for evaluation of generalized weakness. Upon EMS arrival, noted to have a focal seizure that lasted approximately 4 minutes. Patient is currently on Decadron and receiving radiation therapy for brain mets. He was recently admitted from February 13, 2018 to February 16, 2018 for acute encephalopathy thought to be secondary to underlying brain mets and possible UTI. Urine culture was positive for Proteus and patient was discharged with ciprofloxacin.  Patient was somnolent, as such, history was provided by wife at bedside. Wife states that the family was trying to get to the patient to the bathroom today but even before they could get him up from the chair they noticed that he slumped backwards in the chair and lost consciousness for a few minutes. They believe patient has been confused for the past few days. When EMS arrived they noticed that the patient's body was stiff and they thought he was having a seizure. Family denies any prior history of seizures. They are not sure if the patient was incontinent at that time. He does take Decadron daily. Wife states that since his recent hospital discharge patient has been very lethargic and irritable. He never wentback to his baseline. He has not been having any fevers. He has been receiving ciprofloxacin at home and is at the end of the UTItreatment course.  Final Diagnoses/Hospital course Melanoma with brain metastasis, intraventricular hemorrhage, subarachnoid hemorrhage, seizure -CT  head showed scattered intracranial metastatic lesions with increased amount of intraventricular hemorrhage in the occipital horns of the lateral ventricles compared to CT done February 13, 2018.  Also showing mild but increased bilateral subarachnoid hemorrhage along the parietal lobes bilaterally. -EDP discussed with Dr. Burr Medico from oncology, recommended increasing dose of Decadron. -Admitting physician discussed with Dr. Trenton Gammon, neurosurgery, who recommended giving Decadron 4 mg every 6 hours.  Feels CT findings are stable, no urgent surgical need and neurosurgery can be consulted if needed. -was placed on keppra -Patient completed radiation therapy on 02/20/2018 -Called and left message for Dr. Irene Limbo- no call back.  Will attempt to call Dr. Claiborne Billings again today.  If no call back will again contact him on 03/08/2018. -Given increased altered mental status and lethargy today, repeat CT head ordered continued increase in clot layering in the occipital horns of the lateral ventricles. Perisylvian subarachnoid hemorrhage is similar to prior. Unchanged appearance of cerebral metastases with moderate vasogenic edema in the frontal lobes -It seems that patient became more altered overnight, and was made DNR.  He then later passed at 330 this morning.  Leukocytosis -Currently afebrile -Patient was recently treated for UTI (Proteus 02/14/2018) and is now at the end of his treatment course -Suspect leukocytosis secondary to steroid use -WBC trending downward -Repeat urine culture showed no growth -Continue to monitor CBC  Mild Hyponatremia -Resolved  Elevated LFTs -MRI abdomen, January 26, 2018, showed mild diffuse hepatic steatosis.  Normal gallbladder with no cholelithiasis.  No biliary ductal dilatation or choledocholithiasis.  Common bile duct diameter 3 mm. -Trending downward -Continue to monitor   Paroxsymal atrial fibrillation -Not anticoagulation due to history of intracranial hemorrhage and  subarachnoid hematoma -flecainide was continued  Hyperglycemia -Hemoglobin 6.27 October 2017 -Suspect secondary to steroids -was on sliding scale and CBG monitoring  Essential hypertension -Patient was not on any home medications for hypertension  Hypothyroidism -Synthroid was continued   Reactive airway disease -Was on albuterol PRN  Physical deconditioning/generalized weakness -Suspect secondary to metastatic cancer -PT consulted- recommended SNF  Dysphagia -Speech therapy was consulted for evaluation, was placed on dysphagia 2   The results of significant diagnostics from this hospitalization (including imaging, microbiology, ancillary and laboratory) are listed below for reference.    Significant Diagnostic Studies: Ct Head Wo Contrast  Result Date: 02/23/2018 CLINICAL DATA:  Encephalopathy EXAM: CT HEAD WITHOUT CONTRAST TECHNIQUE: Contiguous axial images were obtained from the base of the skull through the vertex without intravenous contrast. COMPARISON:  Five days ago and earlier FINDINGS: Brain: Greater volume of high-density blood products layering in the occipital horns of the lateral ventricles. Lateral ventriculomegaly is unchanged. Mild subarachnoid hemorrhage along the posterior sylvian fissures, similar to prior. Known foci of metastatic melanoma without interval change in lesion size or morphology. These were recently catalogued by brain MRI 02/10/2018. Unchanged moderate vasogenic edema mainly in the upper frontal lobes. Vascular: Atherosclerotic calcification Skull: No acute finding. Two burr holes on the right and a remote craniotomy on the left. Sinuses/Orbits: Bilateral cataract resection IMPRESSION: 1. Continued increase in clot layering in the occipital horns of the lateral ventricles. Lateral ventriculomegaly is stable. 2. Perisylvian subarachnoid hemorrhage is similar to prior. 3. Unchanged appearance of cerebral metastases with moderate vasogenic edema  in the frontal lobes. Electronically Signed   By: Monte Fantasia M.D.   On: 02/23/2018 11:44   Ct Head Wo Contrast  Result Date: 02/07/2018 CLINICAL DATA:  Weakness. Seizure. Metastatic melanoma. Recent radiation therapy for brain tumors. Craniotomy for subdural 02/05/2017. EXAM: CT HEAD WITHOUT CONTRAST TECHNIQUE: Contiguous axial images were obtained from the base of the skull through the vertex without intravenous contrast. COMPARISON:  02/13/2018 FINDINGS: Brain: (Formerly the same on 02/13/2018 by my measurements), a 1.6 by 1.2 cm left inferior frontal lobe lesion (stable), a mixed density 2.9 by 2.6 cm lesion in the left frontal lobe on image 20/2 (stable), a 4.2 by 2.0 cm right frontal mass (previously measuring 3.6 by 3.0 cm but with a somewhat different orientation today and with different slice orientation). Bilateral intraventricular hemorrhage in the occipital horns of the lateral ventricles, slightly increased in volume subjectively. Subarachnoid hemorrhage along both parietal lobes for example on image 19/2, thought to be slightly increased. Cystic cavity along the margin of the dominant right frontal lobe metastatic lesion, potentially with a small amount of blood within the cavity, and with questionable connectivity of this cystic lesion with the frontal horn of the right lateral ventricle. Mild hydrocephalus.  No midline shift. Vascular: Unremarkable Skull: Old left craniotomy.  Old bilateral burr holes. Sinuses/Orbits: Chronic bilateral maxillary sinusitis. Small bilateral mastoid effusions. Orbits unremarkable. Other: No supplemental non-categorized findings. IMPRESSION: 1. Scattered intracranial metastatic lesions mostly similar to the exam from 8 days ago. The dominant right frontal metastatic lesion has changed slightly in orientation but is probably roughly stable. However, the amount of intraventricular hemorrhage in the occipital horns of the lateral ventricles appears to have  increased compared to 02/13/2018, and there is also some mild but increased bilateral subarachnoid hemorrhage along the parietal lobes bilaterally. Stable mild hydrocephalus. Stable postoperative findings in the cranium. 2. Chronic bilateral maxillary sinusitis with small bilateral mastoid effusions. Electronically Signed   By: Cindra Eves.D.  On: 01/31/2018 15:55   Ct Head Wo Contrast  Result Date: 02/13/2018 CLINICAL DATA:  Altered LOC EXAM: CT HEAD WITHOUT CONTRAST TECHNIQUE: Contiguous axial images were obtained from the base of the skull through the vertex without intravenous contrast. COMPARISON:  MRI 02/10/2018, CT brain 01/24/2018, 01/22/2018, 11/18/2017 FINDINGS: Brain: Multiple hemorrhagic brain masses, corresponding to history of metastatic melanoma. Right frontal lobe adjacent lesions are increased in size compared to CT 01/24/2018, but grossly unchanged as compared with MRI 02/10/2018. Hyperdense portion of the mass measures 3.6 x 3 cm, previous measurements in November of 3.6 x 2.4 cm. Increased peripheral hemorrhagic material within the right frontal lobe hyperdense mass as compared with the CT from November. Left parasagittal frontal lobe inferior lesion measures 13 x 11 mm, previously 14 x 12 mm. Left posterior frontal lobe lesion measures 2.7 x 2.6, previously 2.8 x 2.8 cm. Right frontal operculum lesion measuring 15 x 13 mm, previously 16 x 15 mm. Vasogenic edema surrounding the multiple masses, grossly similar as compared with most recent MRI. Decreased hemorrhagic material in the left posterior horn. Slight increased hemorrhagic material in the right posterior horn as compared with prior CT. Probably not significantly changed as compared. Enlarged ventricles are stable in size. There is no midline shift. Vascular: No hyperdense vessels.  Carotid vascular calcification. Skull: Bilateral burr hole and left posterior craniotomy changes. No fracture Sinuses/Orbits: Mucous retention  cysts in the maxillary sinuses. Other: None IMPRESSION: 1. Grossly stable size of multiple hemorrhagic brain masses since MRI 02/10/2018 with surrounding edema. Stable enlarged ventricles. No midline shift. No new mass. 2. Similar appearance of small right greater than left intraventricular hemorrhage since MRI 02/10/2018 Electronically Signed   By: Donavan Foil M.D.   On: 02/13/2018 23:59   Mr Jeri Cos LK Contrast  Result Date: 02/11/2018 CLINICAL DATA:  78 year old male with prior brain hemorrhages. History of atrial fibrillation, not on anticoagulation. Intracranial hemorrhages with follow-up MRI 01/23/2018 demonstrating multiple enhancing brain masses suggesting metastatic disease. Status post CT-guided biopsy of a left adrenal gland mass, pathology revealing Metastatic Melanoma. Treatment planning. EXAM: MRI HEAD WITHOUT AND WITH CONTRAST TECHNIQUE: Multiplanar, multiecho pulse sequences of the brain and surrounding structures were obtained without and with intravenous contrast. CONTRAST:  10 milliliters Gadavist COMPARISON:  01/23/2018. FINDINGS: Study is intermittently degraded by motion artifact despite repeated imaging attempts. Brain: Bilateral frontal lobe hemorrhagic and enhancing masses with only occasional intrinsic T1 hyperintensity. A total of SIX lesions are identified if the right superior frontal lobe adjacent cystic and solid masses are treated as two (versus alternatively five overall if you consider one conglomerate cystic/solid mass measuring up to 4.3 centimeters as on series 13, image 30): - 7 millimeter round enhancing mass just anterior inferior to the right basal ganglia on series 12, image 99. - 19 millimeter right inferior frontal gyrus mass near the operculum on series 12, image 110. - 14 millimeter left anterior inferior frontal gyrus mass on series 12, image 123. - 27 millimeter left middle frontal gyrus mass just above the operculum on image 126. - 37 millimeter largely cystic,  rim enhancing mass in the superior right frontal lobe on image 134. - adjacent 38 millimeter solid right superior frontal gyrus mass on image 148. Most of these have mildly increased in size since November (the lesions on images 126 and 134 are relatively stable). Vasogenic edema associated with each of the lesions has not significantly changed since November. Additionally there is a tiny indeterminate area on series 12, image  147 in the superior right parietal lobe which is only identified on the axial images and does not have associated hemosiderin. This is probably artifact. No definite new brain mass identified. Overlying changes of bilateral craniotomy and/or burr hole placement which likely explains areas of mild dural thickening as postoperative in nature. Other areas of intracranial hemorrhage which are presumably benign. Persistent intraventricular hemorrhage layering in both occipital horns and along the right choroid plexus which is not abnormally enhancing. Superimposed superficial siderosis. No midline shift. Basilar cisterns remain patent. No restricted diffusion or evidence of acute infarction. Stable ventricle size. Negative pituitary and cervicomedullary junction. Vascular: Major intracranial vascular flow voids are preserved. Skull and upper cervical spine: Negative visible cervical spine and spinal cord. Postoperative changes to the skull. Visualized bone marrow signal is within normal limits. Sinuses/Orbits: Stable and negative orbits. Small paranasal sinus mucous retention cysts are stable. Other: Mild mastoid effusions have regressed. No suspicious scalp or face soft tissue abnormality. IMPRESSION: 1. 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 No new lesions since November (see #2). All metastases are annotated on series 12. Most demonstrate mild enlargement since November, with the range of 7 to 38 mm  diameter. Associated vasogenic edema has not significantly changed. No significant intracranial mass effect at this time. 2. Probable artifact in the superior right parietal lobe on series 12, image 147. Attention directed on follow-up. 3. 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 Electronically Signed   By: Genevie Ann M.D.   On: 02/11/2018 08:52   Mr Abdomen W XB Contrast  Result Date: 01/26/2018 CLINICAL DATA:  Inpatient. Multiple enhancing hemorrhagic bilateral cerebral masses compatible with metastatic disease of uncertain primary. Indeterminate left adrenal mass, left renal mass and cystic pancreatic tail mass on recent CT abdomen study. EXAM: MRI ABDOMEN WITHOUT AND WITH CONTRAST TECHNIQUE: Multiplanar multisequence MR imaging of the abdomen was performed both before and after the administration of intravenous contrast. CONTRAST:  10 cc Gadavist IV. COMPARISON:  01/24/2018 CT chest, abdomen and pelvis. FINDINGS: Significantly motion degraded scan, limiting assessment. Lower chest: Enlarged 1.4 cm left lower posterior mediastinal para-aortic node (series 5/image 4), unchanged from 01/24/2018 CT. Hepatobiliary: Normal liver size and configuration. Mild diffuse hepatic steatosis. No liver mass. Normal gallbladder with no cholelithiasis. No biliary ductal dilatation. Common bile duct diameter 3 mm. No choledocholithiasis. Pancreas: There is a unilocular cystic pancreatic lesion measuring 2.4 x 1.8 x 2.0 cm arising exophytically from the anterior pancreatic tail (series 5/image 12), which demonstrates mild wall thickening and enhancement along the posterior margin, with no internal septations. Subcentimeter simple unilocular nonenhancing pancreatic body cystic lesion. No pancreatic duct dilation. No pancreas divisum. Spleen: Normal size. No mass. Adrenals/Urinary Tract: Normal right adrenal. Left adrenal 4.6 x 3.3 cm mass (series 8/image  18) demonstrates mildly heterogeneous signal intensity on T1 and T2 weighted sequences with heterogeneous enhancement and absence of consistent loss of signal intensity on out of phase chemical shift imaging. No hydronephrosis. Several simple renal cysts in both kidneys, largest 2.7 cm in the interpolar left kidney. There is a 1.4 x 1.1 cm mildly T2 hyperintense and T1 isointense renal cortical lesion in the anterior interpolar left kidney (series 8/image 22) without convincing enhancement on the limited motion degraded postcontrast sequences, probably a Bosniak category 2 hemorrhagic/proteinaceous renal cyst. No overtly suspicious renal masses. Stomach/Bowel: Normal non-distended stomach. Visualized small and large bowel  is normal caliber, with no bowel wall thickening. Vascular/Lymphatic: Normal caliber abdominal aorta. Patent portal, splenic, hepatic and renal veins. No pathologically enlarged lymph nodes in the abdomen. Other: No abdominal ascites or focal fluid collection. Musculoskeletal: No aggressive appearing focal osseous lesions. IMPRESSION: 1. Limited motion degraded scan. 2. Heterogeneous enhancing 4.6 cm left adrenal mass remains indeterminate by MRI, with no convincing intralesional lipid. Left adrenal metastasis cannot be excluded. Tissue sampling may be considered. 3. No overtly suspicious renal masses. Probable 1.4 cm Bosniak category 2 hemorrhagic/proteinaceous renal cyst in the anterior interpolar left kidney, for which follow-up MRI abdomen without and with IV contrast is recommended in 6-12 months given the limitations of this motion degraded scan. 4. 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 No pancreatic duct dilation. GI consultation for consideration of EUS/FNA is indicated. This recommendation follows ACR consensus guidelines: Management of Incidental Pancreatic Cysts: A White Paper of the  ACR Incidental Findings Committee. Loraine [id number]. 5. Redemonstration of nonspecific mild left lower mediastinal adenopathy. 6. Mild diffuse hepatic steatosis. Electronically Signed   By: Ilona Sorrel M.D.   On: 01/26/2018 18:09   Ct Biopsy  Result Date: 01/29/2018 INDICATION: No known primary, now with intracranial metastasis an indeterminate left adrenal gland mass. Please perform CT-guided left adrenal gland mass biopsy for tissue diagnostic purposes. EXAM: CT-GUIDED BIOPSY INDETERMINATE LEFT ADRENAL GLAND MASS COMPARISON:  CT of the chest, abdomen and pelvis - 01/24/2018; abdominal MRI-01/26/2018 MEDICATIONS: None. ANESTHESIA/SEDATION: Fentanyl 50 mcg IV; Versed 1 mg IV Sedation time: 15 minutes; The patient was continuously monitored during the procedure by the interventional radiology nurse under my direct supervision. CONTRAST:  None. COMPLICATIONS: None immediate. PROCEDURE: Informed consent was obtained from the patient following an explanation of the procedure, risks, benefits and alternatives. A time out was performed prior to the initiation of the procedure. The patient was positioned prone on the CT table and a limited CT was performed for procedural planning demonstrating unchanged size and appearance of the at least 4.0 x 3.2 cm left adrenal gland mass (image 42, series 2). The procedure was planned. The operative site was prepped and draped in the usual sterile fashion. Appropriate trajectory was confirmed with a 22 gauge spinal needle after the adjacent tissues were anesthetized with 1% Lidocaine with epinephrine. Under intermittent CT guidance, a 17 gauge coaxial needle was advanced into the peripheral aspect of the mass. Appropriate positioning was confirmed and 4 core needle biopsy samples were obtained with an 18 gauge core needle biopsy device. The co-axial needle was removed following the administration of a Gel-Foam slurry and superficial hemostasis was achieved with  manual compression. A limited postprocedural CT was negative for significant peri biopsy hemorrhage or additional complication. A dressing was placed. The patient tolerated the procedure well without immediate postprocedural complication. IMPRESSION: Technically successful CT guided core needle biopsy of indeterminate left adrenal gland mass. Electronically Signed   By: Sandi Mariscal M.D.   On: 01/29/2018 12:22    Microbiology: Recent Results (from the past 240 hour(s))  Culture, Urine     Status: None   Collection Time: 02/19/18  5:21 AM  Result Value Ref Range Status   Specimen Description URINE, RANDOM  Final   Special Requests NONE  Final   Culture   Final    NO GROWTH Performed at [facility], [address] [place],  [address]    Report Status  02/20/2018 FINAL  Final     Labs: Basic Metabolic Panel: Recent Labs  Lab 02/08/2018 1513 02/20/18 0426  NA 132* 137  K 3.6 4.6  CL 98 102  CO2 21* 23  GLUCOSE 145* 122*  BUN 20 24*  CREATININE 1.11 1.00  CALCIUM 9.3 9.0   Liver Function Tests: Recent Labs  Lab 02/17/2018 1516 02/20/18 0426  AST 30 24  ALT 62* 47*  ALKPHOS 53 43  BILITOT 1.4* 1.3*  PROT 7.4 6.1*  ALBUMIN 3.8 3.2*   No results for input(s): LIPASE, AMYLASE in the last 168 hours. No results for input(s): AMMONIA in the last 168 hours. CBC: Recent Labs  Lab 02/04/2018 1513 02/20/18 0426  WBC 15.6* 13.1*  NEUTROABS 12.8*  --   HGB 16.7 15.0  HCT 50.2 45.1  MCV 86.0 86.6  PLT 217 223   Cardiac Enzymes: No results for input(s): CKTOTAL, CKMB, CKMBINDEX, TROPONINI in the last 168 hours. D-Dimer No results for input(s): DDIMER in the last 72 hours. BNP: Invalid input(s): POCBNP CBG: Recent Labs  Lab 02/21/18 1137 02/21/18 1629 02/21/18 2155 02/22/18 1652 02/22/18 2151  GLUCAP 139* 125* 144* 161* 200*   Anemia work up No results for input(s): VITAMINB12, FOLATE, FERRITIN, TIBC, IRON, RETICCTPCT in the last 72 hours. Urinalysis      Component Value Date/Time   COLORURINE YELLOW 02/14/2018 0221   APPEARANCEUR TURBID (A) 02/14/2018 0221   LABSPEC <1.005 (L) 02/14/2018 0221   PHURINE >9.0 (H) 02/14/2018 0221   GLUCOSEU NEGATIVE 02/14/2018 0221   HGBUR TRACE (A) 02/14/2018 0221   BILIRUBINUR SMALL (A) 02/14/2018 0221   KETONESUR NEGATIVE 02/14/2018 0221   PROTEINUR >300 (A) 02/14/2018 0221   UROBILINOGEN 1.0 05/11/2010 1145   NITRITE POSITIVE (A) 02/14/2018 0221   LEUKOCYTESUR LARGE (A) 02/14/2018 0221   Sepsis Labs Invalid input(s): PROCALCITONIN,  WBC,  LACTICIDVEN     SIGNED:  Cristal Ford, MD  [facility] 21-Mar-2018, 6:57 AM Pager   If 7PM-7AM, please contact night-coverage [email or web address] Password [email or web address]

## 2018-02-26 NOTE — Consult Note (Addendum)
   [facility] Inpatient Consult   2018/03/04  LEMUEL BOODRAM June 26, 1940 [id number]    [facility] Care Management follow up.  Chart reviewed. Noted Mr. Corvino expired this morning.    Marthenia Rolling, MSN-Ed, RN,BSN [facility] Liaison [phone]

## 2018-02-26 DEATH — deceased

## 2018-02-27 ENCOUNTER — Ambulatory Visit (HOSPITAL_COMMUNITY): Payer: PPO

## 2018-03-04 ENCOUNTER — Other Ambulatory Visit: Payer: PPO

## 2018-03-04 ENCOUNTER — Ambulatory Visit: Payer: PPO | Admitting: Hematology

## 2018-03-07 ENCOUNTER — Ambulatory Visit: Payer: PPO | Admitting: Radiation Oncology

## 2018-03-07 ENCOUNTER — Encounter: Payer: Self-pay | Admitting: Radiation Oncology

## 2018-03-07 NOTE — Progress Notes (Signed)
  Radiation Oncology         [phone] ________________________________  Name: [name] [name] MRN: [id number]  Date: 03/07/2018  DOB: [birth year]  End of Treatment Note  Diagnosis: Brain metastases    Indication for treatment:  Palliative      Radiation treatment dates:   02/14/2018, 02/17/2018, and 02/20/2018  Site:  Brain  Beams/energy/dose:  Photon, SBRT/SRT-VMAT, 6FFF                                 PTV1 Rt Front 85mm 9Gy x 3 fractions to total 27Gy           PTV2 LMid Frontal Gyrus 48mm 18 Gy in 1 fraction           PTV3 Rt Inf Frontal Gyrus 61mm 20 Gy in 1 fraction           PTV4 Lt Ant Inf Frontal Gyrus 67mm 20 Gy in 1 fraction           PTV5 Ant Inf Rt Basal Ganglia 68mm 20 Gy in 1 fraction  Narrative: The patient tolerated radiation treatment relatively well.     Plan: The patient had a seizure following his last fraction of SRS.  He was hospitalized and days later he unfortunately died;  therefore no follow up is scheduled.   -----------------------------------  Eppie Gibson, MD  This document serves as a record of services personally performed by Eppie Gibson, MD. It was created on her behalf by Steva Colder, a trained medical scribe. The creation of this record is based on the scribe's personal observations and the provider's statements to them. This document has been checked and approved by the attending provider.

## 2018-03-12 ENCOUNTER — Ambulatory Visit: Payer: PPO | Admitting: Adult Health

## 2018-03-21 ENCOUNTER — Ambulatory Visit: Payer: Self-pay | Admitting: Radiation Oncology

## 2018-03-21 ENCOUNTER — Ambulatory Visit: Payer: PPO | Admitting: Internal Medicine

## 2018-05-02 IMAGING — CT CT HEAD W/O CM
1 series · 15 of 30 positions shown, 19 images · non-contrast
Comparison: CT 02/13/2017

CLINICAL DATA: Subdural hematoma followup

EXAM:
CT HEAD WITHOUT CONTRAST
TECHNIQUE: Contiguous axial images were obtained from the base of the skull
through the vertex without intravenous contrast.

[Series 2: head w/(date) · axial · 0.46mm/px · z∈[-420,-280]mm · 15 of 32 slices shown, 19 images]
[im 2/32  brain]
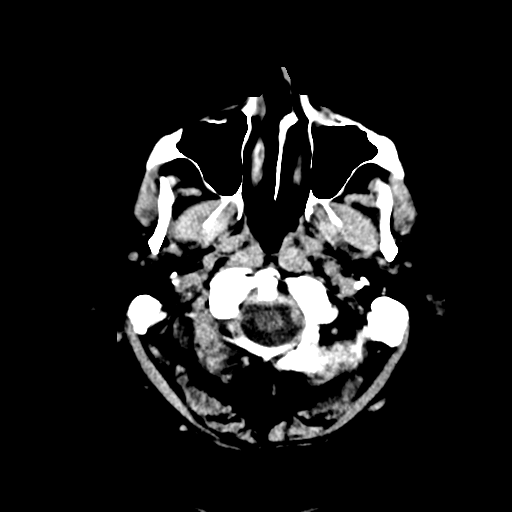
[im 2/32  bone]
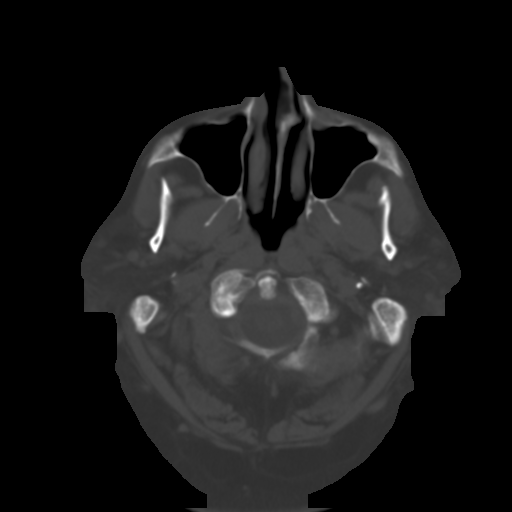
[im 4/32  brain]
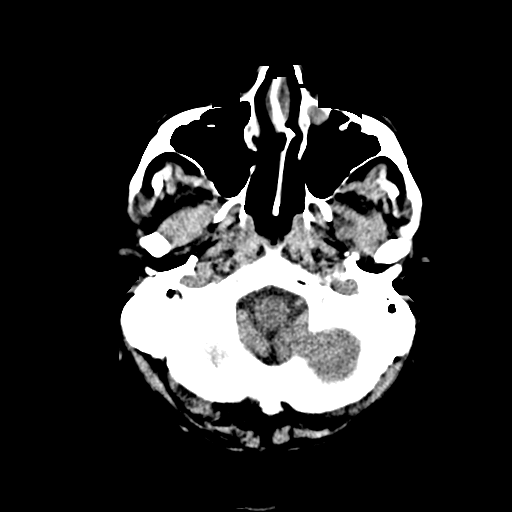
[im 6/32  brain]
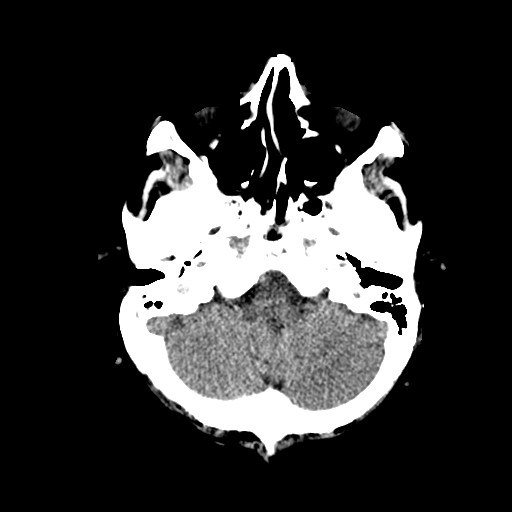
[im 8/32  brain]
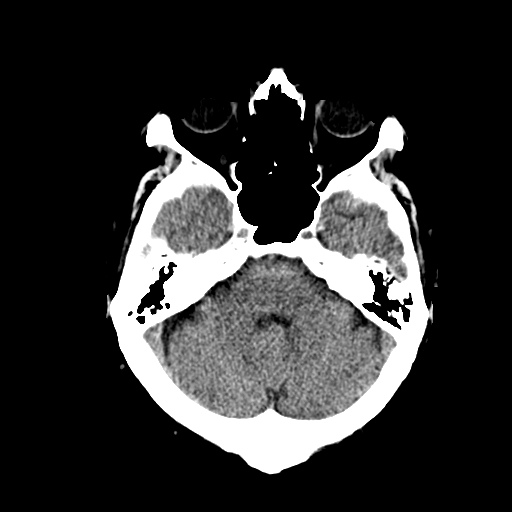
[im 10/32  brain]
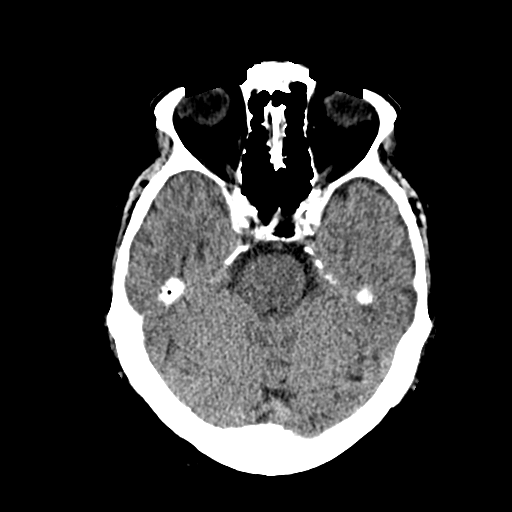
[im 10/32  bone]
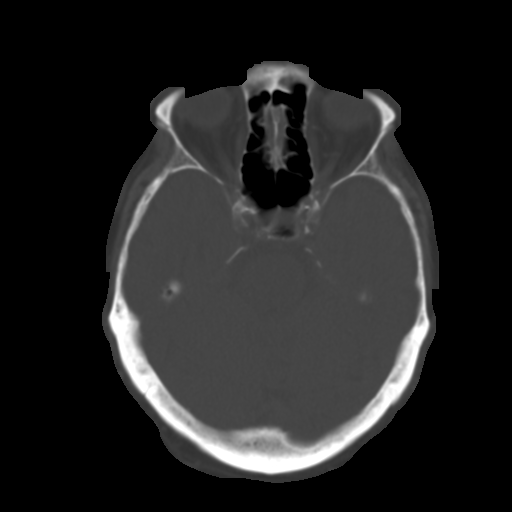
[im 12/32  brain]
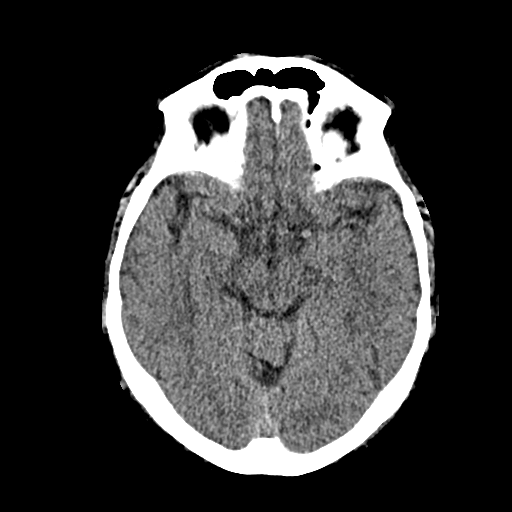
[im 14/32  brain]
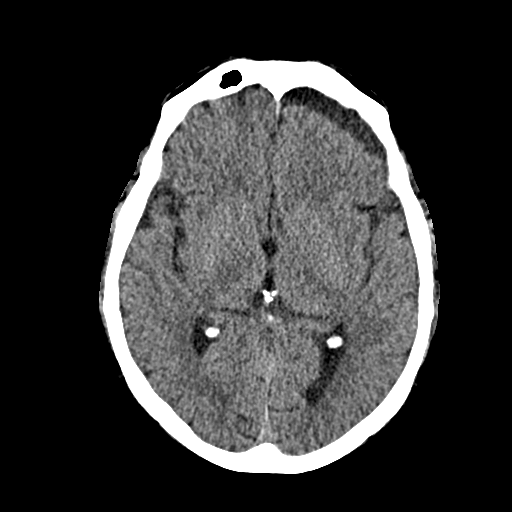
[im 17/32  brain]
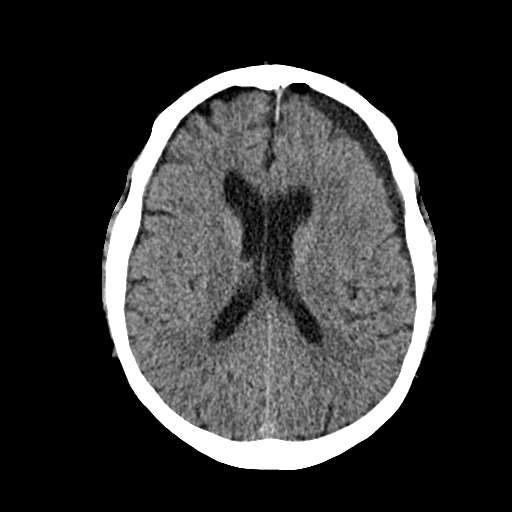
[im 18/32  brain]
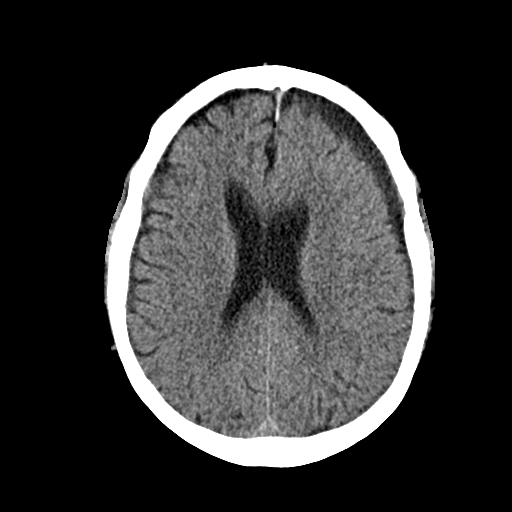
[im 18/32  bone]
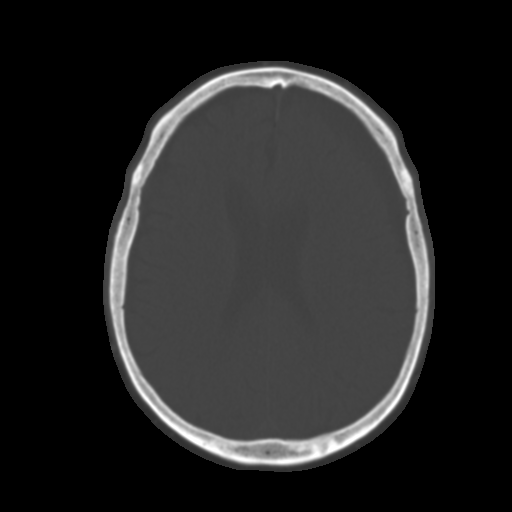
[im 20/32  brain]
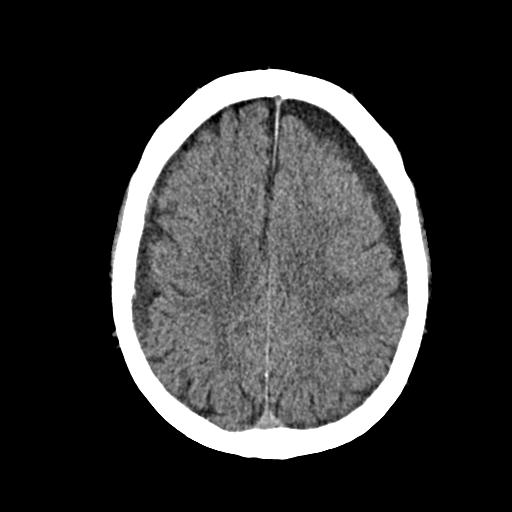
[im 22/32  brain]
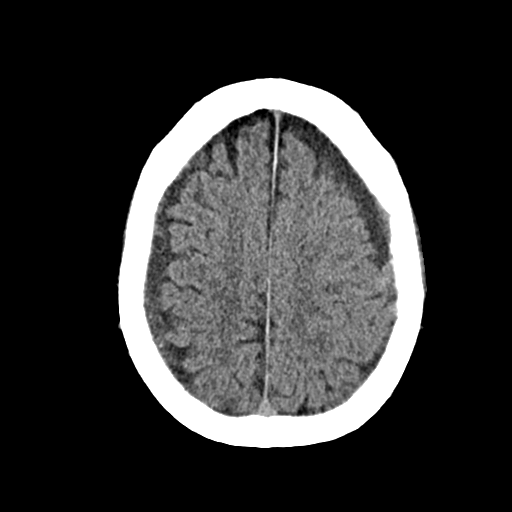
[im 24/32  brain]
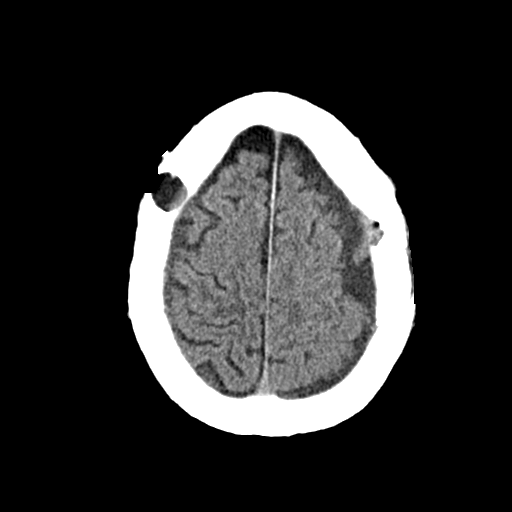
[im 26/32  brain]
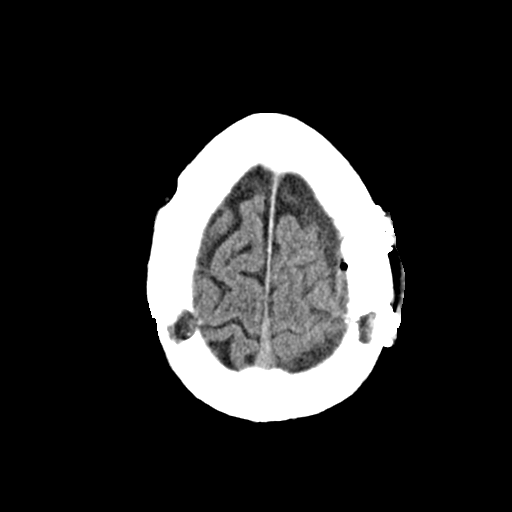
[im 26/32  bone]
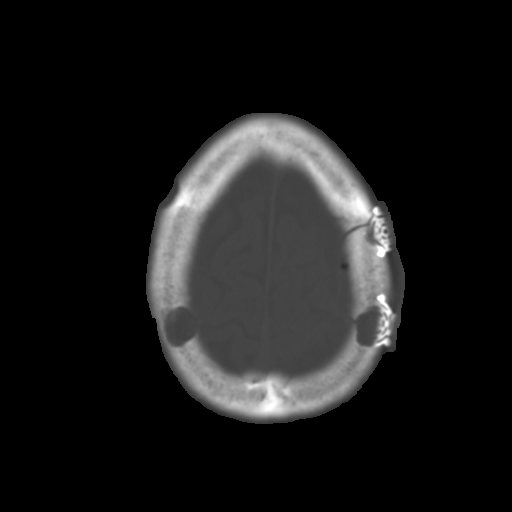
[im 28/32  brain]
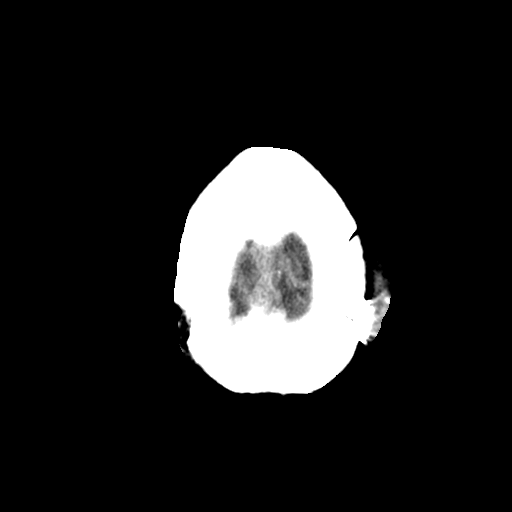
[im 30/32  brain]
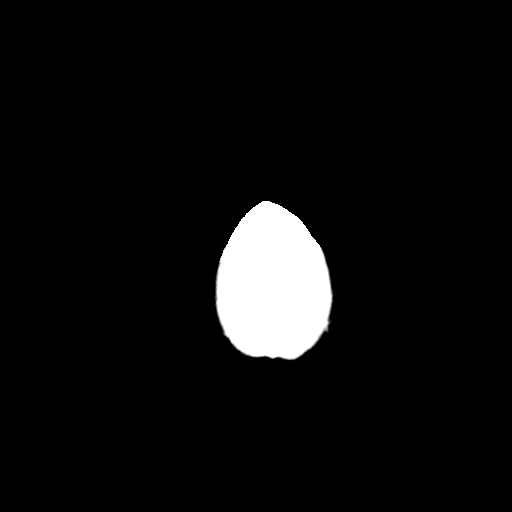

[15 of 30 positions shown; findings below may reference images not displayed]

FINDINGS: Brain: Left frontal low-density subdural fluid collection slightly
smaller now measuring 8.0 mm in thickness. Improvement in subdural
gas and intermediate density blood. Intermediate density membranes
are present within the fluid collection. No new area of acute and
hemorrhage.

Small right frontal parietal convexity subdural fluid collection
also improved in size with decrease density.

Negative for acute infarct. Negative for hemorrhage. Improvement in
midline shift to the right, now measuring 4 mm.

Vascular: Negative for hyperdense vessel

Skull: Left-sided craniotomy.  Burr holes on the right.

Sinuses/Orbits: Negative

Other: None
IMPRESSION: Interval improvement in bilateral subdural fluid collections which
are now lower density. No new area of acute hemorrhage. Improvement
in midline shift to the right now 4 mm.

## 2018-09-01 ENCOUNTER — Encounter: Payer: PPO | Admitting: *Deleted
